# Patient Record
Sex: Male | Born: 1935 | Race: White | Hispanic: No | Marital: Married | Smoking: Former smoker
Health system: Southern US, Community
[De-identification: ages and names within clinical notes are randomized; demographics above are authoritative.]

## PROBLEM LIST (undated history)

## (undated) DIAGNOSIS — I739 Peripheral vascular disease, unspecified: Secondary | ICD-10-CM

## (undated) DIAGNOSIS — I4891 Unspecified atrial fibrillation: Secondary | ICD-10-CM

## (undated) DIAGNOSIS — I10 Essential (primary) hypertension: Secondary | ICD-10-CM

## (undated) DIAGNOSIS — I509 Heart failure, unspecified: Secondary | ICD-10-CM

## (undated) DIAGNOSIS — Z953 Presence of xenogenic heart valve: Secondary | ICD-10-CM

## (undated) DIAGNOSIS — Z951 Presence of aortocoronary bypass graft: Secondary | ICD-10-CM

## (undated) DIAGNOSIS — I2510 Atherosclerotic heart disease of native coronary artery without angina pectoris: Secondary | ICD-10-CM

## (undated) HISTORY — PX: ABDOMINAL SURGERY: SHX537

## (undated) HISTORY — PX: CATARACT EXTRACTION W/ INTRAOCULAR LENS IMPLANT: SHX1309

## (undated) HISTORY — PX: HERNIA REPAIR: SHX51

## (undated) HISTORY — PX: BACK SURGERY: SHX140

---

## 2005-07-22 ENCOUNTER — Ambulatory Visit: Payer: Self-pay | Admitting: Cardiology

## 2005-07-26 ENCOUNTER — Ambulatory Visit: Payer: Self-pay

## 2005-08-31 ENCOUNTER — Ambulatory Visit: Payer: Self-pay | Admitting: Ophthalmology

## 2005-09-06 ENCOUNTER — Ambulatory Visit: Payer: Self-pay | Admitting: Ophthalmology

## 2005-10-19 ENCOUNTER — Ambulatory Visit: Payer: Self-pay | Admitting: Ophthalmology

## 2005-10-21 ENCOUNTER — Ambulatory Visit: Payer: Self-pay

## 2005-10-25 ENCOUNTER — Ambulatory Visit: Payer: Self-pay | Admitting: Ophthalmology

## 2005-11-30 ENCOUNTER — Ambulatory Visit: Payer: Self-pay | Admitting: Cardiology

## 2005-12-02 ENCOUNTER — Ambulatory Visit: Payer: Self-pay | Admitting: Physician Assistant

## 2006-02-17 ENCOUNTER — Ambulatory Visit: Payer: Self-pay | Admitting: Internal Medicine

## 2006-12-01 ENCOUNTER — Ambulatory Visit: Payer: Self-pay | Admitting: Urology

## 2006-12-19 DIAGNOSIS — Z951 Presence of aortocoronary bypass graft: Secondary | ICD-10-CM

## 2006-12-19 DIAGNOSIS — Z953 Presence of xenogenic heart valve: Secondary | ICD-10-CM

## 2006-12-19 HISTORY — PX: CORONARY ARTERY BYPASS GRAFT: SHX141

## 2006-12-19 HISTORY — DX: Presence of xenogenic heart valve: Z95.3

## 2006-12-19 HISTORY — DX: Presence of aortocoronary bypass graft: Z95.1

## 2007-01-30 ENCOUNTER — Ambulatory Visit: Payer: Self-pay | Admitting: Surgery

## 2007-02-06 ENCOUNTER — Inpatient Hospital Stay: Payer: Self-pay | Admitting: Surgery

## 2007-02-10 ENCOUNTER — Emergency Department: Payer: Self-pay | Admitting: Unknown Physician Specialty

## 2007-04-16 ENCOUNTER — Inpatient Hospital Stay: Payer: Self-pay | Admitting: Internal Medicine

## 2007-04-16 ENCOUNTER — Other Ambulatory Visit: Payer: Self-pay

## 2007-05-16 ENCOUNTER — Encounter: Payer: Self-pay | Admitting: Cardiology

## 2007-05-20 ENCOUNTER — Encounter: Payer: Self-pay | Admitting: Cardiology

## 2007-06-19 ENCOUNTER — Encounter: Payer: Self-pay | Admitting: Cardiology

## 2007-07-20 ENCOUNTER — Encounter: Payer: Self-pay | Admitting: Cardiology

## 2007-08-16 ENCOUNTER — Ambulatory Visit: Payer: Self-pay | Admitting: Internal Medicine

## 2007-08-20 ENCOUNTER — Encounter: Payer: Self-pay | Admitting: Cardiology

## 2007-08-20 ENCOUNTER — Ambulatory Visit: Payer: Self-pay | Admitting: Internal Medicine

## 2007-09-19 ENCOUNTER — Encounter: Payer: Self-pay | Admitting: Cardiology

## 2008-04-23 ENCOUNTER — Ambulatory Visit: Payer: Self-pay | Admitting: Internal Medicine

## 2008-06-10 ENCOUNTER — Ambulatory Visit: Payer: Self-pay | Admitting: Pain Medicine

## 2008-06-24 ENCOUNTER — Ambulatory Visit: Payer: Self-pay | Admitting: Pain Medicine

## 2008-06-25 ENCOUNTER — Ambulatory Visit: Payer: Self-pay | Admitting: Pain Medicine

## 2008-06-26 ENCOUNTER — Ambulatory Visit: Payer: Self-pay | Admitting: Pain Medicine

## 2008-07-02 ENCOUNTER — Ambulatory Visit: Payer: Self-pay | Admitting: Pain Medicine

## 2008-07-07 ENCOUNTER — Ambulatory Visit: Payer: Self-pay | Admitting: Pain Medicine

## 2008-07-22 ENCOUNTER — Ambulatory Visit: Payer: Self-pay | Admitting: Pain Medicine

## 2008-08-06 ENCOUNTER — Ambulatory Visit: Payer: Self-pay | Admitting: Surgery

## 2008-08-13 ENCOUNTER — Inpatient Hospital Stay: Payer: Self-pay | Admitting: Surgery

## 2008-10-15 ENCOUNTER — Ambulatory Visit: Payer: Self-pay | Admitting: Internal Medicine

## 2008-11-06 ENCOUNTER — Ambulatory Visit: Payer: Self-pay | Admitting: Unknown Physician Specialty

## 2008-11-10 ENCOUNTER — Ambulatory Visit: Payer: Self-pay | Admitting: Unknown Physician Specialty

## 2010-01-28 ENCOUNTER — Inpatient Hospital Stay: Payer: Self-pay | Admitting: Vascular Surgery

## 2010-02-18 ENCOUNTER — Inpatient Hospital Stay: Payer: Self-pay | Admitting: Vascular Surgery

## 2010-04-14 ENCOUNTER — Ambulatory Visit: Payer: Self-pay | Admitting: Vascular Surgery

## 2010-06-30 ENCOUNTER — Ambulatory Visit: Payer: Self-pay | Admitting: Internal Medicine

## 2010-07-21 ENCOUNTER — Ambulatory Visit: Payer: Self-pay | Admitting: Internal Medicine

## 2010-07-28 ENCOUNTER — Ambulatory Visit: Payer: Self-pay | Admitting: Internal Medicine

## 2010-08-12 ENCOUNTER — Ambulatory Visit: Payer: Self-pay | Admitting: Internal Medicine

## 2010-08-19 ENCOUNTER — Ambulatory Visit: Payer: Self-pay | Admitting: Internal Medicine

## 2010-09-16 ENCOUNTER — Ambulatory Visit: Payer: Self-pay | Admitting: Internal Medicine

## 2010-09-16 ENCOUNTER — Inpatient Hospital Stay: Payer: Self-pay | Admitting: Internal Medicine

## 2010-09-30 ENCOUNTER — Ambulatory Visit: Payer: Self-pay | Admitting: Podiatry

## 2011-05-03 ENCOUNTER — Encounter: Payer: Self-pay | Admitting: Cardiothoracic Surgery

## 2011-05-18 ENCOUNTER — Ambulatory Visit: Payer: Self-pay | Admitting: Vascular Surgery

## 2011-05-20 ENCOUNTER — Encounter: Payer: Self-pay | Admitting: Cardiothoracic Surgery

## 2011-08-23 ENCOUNTER — Encounter: Payer: Self-pay | Admitting: Cardiothoracic Surgery

## 2011-08-23 ENCOUNTER — Encounter: Payer: Self-pay | Admitting: Nurse Practitioner

## 2011-08-26 ENCOUNTER — Emergency Department: Payer: Self-pay

## 2011-09-19 ENCOUNTER — Encounter: Payer: Self-pay | Admitting: Cardiothoracic Surgery

## 2011-09-19 ENCOUNTER — Encounter: Payer: Self-pay | Admitting: Nurse Practitioner

## 2011-12-19 ENCOUNTER — Emergency Department: Payer: Self-pay | Admitting: Emergency Medicine

## 2012-01-19 ENCOUNTER — Ambulatory Visit: Payer: Self-pay | Admitting: Vascular Surgery

## 2012-01-19 LAB — BASIC METABOLIC PANEL
Calcium, Total: 9 mg/dL (ref 8.5–10.1)
Chloride: 103 mmol/L (ref 98–107)
Co2: 32 mmol/L (ref 21–32)
Creatinine: 1.72 mg/dL — ABNORMAL HIGH (ref 0.60–1.30)
EGFR (African American): 49 — ABNORMAL LOW
EGFR (Non-African Amer.): 40 — ABNORMAL LOW
Potassium: 4.3 mmol/L (ref 3.5–5.1)
Sodium: 143 mmol/L (ref 136–145)

## 2012-01-19 LAB — PROTIME-INR: Prothrombin Time: 22.3 secs — ABNORMAL HIGH (ref 11.5–14.7)

## 2012-07-19 ENCOUNTER — Ambulatory Visit: Payer: Self-pay | Admitting: Internal Medicine

## 2012-07-19 HISTORY — PX: BELOW KNEE LEG AMPUTATION: SUR23

## 2012-07-24 ENCOUNTER — Inpatient Hospital Stay: Payer: Self-pay | Admitting: Physician Assistant

## 2012-07-24 LAB — BASIC METABOLIC PANEL
BUN: 42 mg/dL — ABNORMAL HIGH (ref 7–18)
Calcium, Total: 8.5 mg/dL (ref 8.5–10.1)
Creatinine: 1.69 mg/dL — ABNORMAL HIGH (ref 0.60–1.30)
EGFR (African American): 42 — ABNORMAL LOW
EGFR (Non-African Amer.): 36 — ABNORMAL LOW
Glucose: 315 mg/dL — ABNORMAL HIGH (ref 65–99)
Sodium: 137 mmol/L (ref 136–145)

## 2012-07-24 LAB — PROTIME-INR: Prothrombin Time: 48.4 secs — ABNORMAL HIGH (ref 11.5–14.7)

## 2012-07-25 LAB — CBC WITH DIFFERENTIAL/PLATELET
Basophil #: 0.1 10*3/uL (ref 0.0–0.1)
Basophil %: 1.1 %
Eosinophil %: 3.7 %
HCT: 38.2 % — ABNORMAL LOW (ref 40.0–52.0)
HGB: 12.8 g/dL — ABNORMAL LOW (ref 13.0–18.0)
Lymphocyte #: 1.1 10*3/uL (ref 1.0–3.6)
Lymphocyte %: 10.2 %
MCV: 96 fL (ref 80–100)
Monocyte %: 8.6 %
Neutrophil #: 8.2 10*3/uL — ABNORMAL HIGH (ref 1.4–6.5)
RBC: 4 10*6/uL — ABNORMAL LOW (ref 4.40–5.90)
WBC: 10.7 10*3/uL — ABNORMAL HIGH (ref 3.8–10.6)

## 2012-07-25 LAB — BASIC METABOLIC PANEL
BUN: 32 mg/dL — ABNORMAL HIGH (ref 7–18)
Calcium, Total: 8.4 mg/dL — ABNORMAL LOW (ref 8.5–10.1)
Creatinine: 1.46 mg/dL — ABNORMAL HIGH (ref 0.60–1.30)
EGFR (African American): 50 — ABNORMAL LOW
EGFR (Non-African Amer.): 43 — ABNORMAL LOW
Glucose: 179 mg/dL — ABNORMAL HIGH (ref 65–99)
Potassium: 3.9 mmol/L (ref 3.5–5.1)
Sodium: 140 mmol/L (ref 136–145)

## 2012-07-25 LAB — PROTIME-INR
INR: 5.3
Prothrombin Time: 48.4 secs — ABNORMAL HIGH (ref 11.5–14.7)

## 2012-07-26 LAB — CBC WITH DIFFERENTIAL/PLATELET
Basophil %: 1 %
Eosinophil #: 0.4 10*3/uL (ref 0.0–0.7)
Eosinophil %: 3.6 %
HCT: 41.1 % (ref 40.0–52.0)
HGB: 14.3 g/dL (ref 13.0–18.0)
Lymphocyte %: 11.5 %
MCHC: 34.7 g/dL (ref 32.0–36.0)
Monocyte %: 8.3 %
Neutrophil #: 9.1 10*3/uL — ABNORMAL HIGH (ref 1.4–6.5)
Neutrophil %: 75.6 %
RBC: 4.26 10*6/uL — ABNORMAL LOW (ref 4.40–5.90)
WBC: 12 10*3/uL — ABNORMAL HIGH (ref 3.8–10.6)

## 2012-07-26 LAB — BASIC METABOLIC PANEL
BUN: 31 mg/dL — ABNORMAL HIGH (ref 7–18)
Creatinine: 1.55 mg/dL — ABNORMAL HIGH (ref 0.60–1.30)
EGFR (Non-African Amer.): 40 — ABNORMAL LOW
Glucose: 91 mg/dL (ref 65–99)
Osmolality: 280 (ref 275–301)
Potassium: 4.4 mmol/L (ref 3.5–5.1)

## 2012-07-26 LAB — TROPONIN I: Troponin-I: 0.05 ng/mL

## 2012-07-26 LAB — PROTIME-INR
INR: 2.5
Prothrombin Time: 26.8 secs — ABNORMAL HIGH (ref 11.5–14.7)

## 2012-07-27 LAB — CBC WITH DIFFERENTIAL/PLATELET
Basophil #: 0.1 10*3/uL (ref 0.0–0.1)
HCT: 38 % — ABNORMAL LOW (ref 40.0–52.0)
Lymphocyte #: 0.6 10*3/uL — ABNORMAL LOW (ref 1.0–3.6)
MCH: 32.6 pg (ref 26.0–34.0)
MCV: 97 fL (ref 80–100)
Monocyte #: 1.2 x10 3/mm — ABNORMAL HIGH (ref 0.2–1.0)
Neutrophil #: 16.5 10*3/uL — ABNORMAL HIGH (ref 1.4–6.5)
Platelet: 275 10*3/uL (ref 150–440)
RDW: 14.2 % (ref 11.5–14.5)

## 2012-07-27 LAB — PROTIME-INR
INR: 1.5
Prothrombin Time: 18.8 secs — ABNORMAL HIGH (ref 11.5–14.7)

## 2012-07-27 LAB — CK TOTAL AND CKMB (NOT AT ARMC)
CK, Total: 178 U/L (ref 35–232)
CK, Total: 189 U/L (ref 35–232)
CK-MB: 10.9 ng/mL — ABNORMAL HIGH (ref 0.5–3.6)
CK-MB: 10.6 ng/mL — ABNORMAL HIGH (ref 0.5–3.6)

## 2012-07-27 LAB — COMPREHENSIVE METABOLIC PANEL
Anion Gap: 10 (ref 7–16)
Bilirubin,Total: 0.7 mg/dL (ref 0.2–1.0)
Co2: 24 mmol/L (ref 21–32)
Creatinine: 2.35 mg/dL — ABNORMAL HIGH (ref 0.60–1.30)
Glucose: 258 mg/dL — ABNORMAL HIGH (ref 65–99)
Osmolality: 283 (ref 275–301)
SGPT (ALT): 22 U/L (ref 12–78)

## 2012-07-27 LAB — TROPONIN I: Troponin-I: 1.95 ng/mL — ABNORMAL HIGH

## 2012-07-28 LAB — COMPREHENSIVE METABOLIC PANEL
Albumin: 2.4 g/dL — ABNORMAL LOW (ref 3.4–5.0)
Alkaline Phosphatase: 84 U/L (ref 50–136)
BUN: 47 mg/dL — ABNORMAL HIGH (ref 7–18)
Chloride: 100 mmol/L (ref 98–107)
Creatinine: 2.51 mg/dL — ABNORMAL HIGH (ref 0.60–1.30)
EGFR (African American): 26 — ABNORMAL LOW
Glucose: 266 mg/dL — ABNORMAL HIGH (ref 65–99)
Potassium: 4.7 mmol/L (ref 3.5–5.1)
SGPT (ALT): 17 U/L (ref 12–78)
Sodium: 133 mmol/L — ABNORMAL LOW (ref 136–145)
Total Protein: 6.5 g/dL (ref 6.4–8.2)

## 2012-07-28 LAB — CBC WITH DIFFERENTIAL/PLATELET
Eosinophil %: 1.9 %
HCT: 37.6 % — ABNORMAL LOW (ref 40.0–52.0)
Lymphocyte #: 0.7 10*3/uL — ABNORMAL LOW (ref 1.0–3.6)
Lymphocyte %: 5.3 %
MCV: 97 fL (ref 80–100)
Monocyte %: 6.4 %
Neutrophil #: 11.7 10*3/uL — ABNORMAL HIGH (ref 1.4–6.5)
Platelet: 239 10*3/uL (ref 150–440)
RBC: 3.89 10*6/uL — ABNORMAL LOW (ref 4.40–5.90)
WBC: 13.6 10*3/uL — ABNORMAL HIGH (ref 3.8–10.6)

## 2012-07-28 LAB — PROTIME-INR
INR: 1.2
Prothrombin Time: 15.5 secs — ABNORMAL HIGH (ref 11.5–14.7)

## 2012-07-29 LAB — CBC WITH DIFFERENTIAL/PLATELET
Basophil %: 0.4 %
HCT: 36.1 % — ABNORMAL LOW (ref 40.0–52.0)
HGB: 12.2 g/dL — ABNORMAL LOW (ref 13.0–18.0)
Lymphocyte #: 0.8 10*3/uL — ABNORMAL LOW (ref 1.0–3.6)
Lymphocyte %: 6.5 %
MCV: 96 fL (ref 80–100)
Monocyte %: 7.7 %
Neutrophil #: 10.3 10*3/uL — ABNORMAL HIGH (ref 1.4–6.5)
RDW: 14 % (ref 11.5–14.5)
WBC: 12.6 10*3/uL — ABNORMAL HIGH (ref 3.8–10.6)

## 2012-07-29 LAB — BASIC METABOLIC PANEL
Anion Gap: 7 (ref 7–16)
Calcium, Total: 8 mg/dL — ABNORMAL LOW (ref 8.5–10.1)
Co2: 26 mmol/L (ref 21–32)
EGFR (African American): 32 — ABNORMAL LOW

## 2012-07-29 LAB — PROTIME-INR: INR: 1.1

## 2012-07-30 LAB — CBC WITH DIFFERENTIAL/PLATELET
Basophil #: 0.1 10*3/uL (ref 0.0–0.1)
Eosinophil %: 0.4 %
Lymphocyte #: 0.7 10*3/uL — ABNORMAL LOW (ref 1.0–3.6)
Lymphocyte %: 3.9 %
MCV: 97 fL (ref 80–100)
Monocyte #: 1.4 x10 3/mm — ABNORMAL HIGH (ref 0.2–1.0)
Monocyte %: 7.3 %
Neutrophil #: 16.5 10*3/uL — ABNORMAL HIGH (ref 1.4–6.5)
Platelet: 233 10*3/uL (ref 150–440)
RBC: 3.27 10*6/uL — ABNORMAL LOW (ref 4.40–5.90)
RDW: 14.5 % (ref 11.5–14.5)
WBC: 18.8 10*3/uL — ABNORMAL HIGH (ref 3.8–10.6)

## 2012-07-30 LAB — BASIC METABOLIC PANEL
Anion Gap: 7 (ref 7–16)
Chloride: 103 mmol/L (ref 98–107)
Co2: 25 mmol/L (ref 21–32)
Creatinine: 1.9 mg/dL — ABNORMAL HIGH (ref 0.60–1.30)
Potassium: 5.1 mmol/L (ref 3.5–5.1)

## 2012-07-31 LAB — PHOSPHORUS: Phosphorus: 2.8 mg/dL (ref 2.5–4.9)

## 2012-07-31 LAB — CBC WITH DIFFERENTIAL/PLATELET
Basophil #: 0.1 10*3/uL (ref 0.0–0.1)
Basophil %: 0.5 %
Eosinophil #: 0.2 10*3/uL (ref 0.0–0.7)
HCT: 29 % — ABNORMAL LOW (ref 40.0–52.0)
HGB: 9.7 g/dL — ABNORMAL LOW (ref 13.0–18.0)
Lymphocyte #: 0.7 10*3/uL — ABNORMAL LOW (ref 1.0–3.6)
Lymphocyte %: 4 %
MCHC: 33.4 g/dL (ref 32.0–36.0)
MCV: 98 fL (ref 80–100)
Monocyte %: 9.4 %
Neutrophil #: 14.3 10*3/uL — ABNORMAL HIGH (ref 1.4–6.5)

## 2012-07-31 LAB — BASIC METABOLIC PANEL
Calcium, Total: 8.1 mg/dL — ABNORMAL LOW (ref 8.5–10.1)
Co2: 26 mmol/L (ref 21–32)
Creatinine: 2.14 mg/dL — ABNORMAL HIGH (ref 0.60–1.30)
EGFR (African American): 31 — ABNORMAL LOW
Potassium: 4.7 mmol/L (ref 3.5–5.1)
Sodium: 136 mmol/L (ref 136–145)

## 2012-08-01 LAB — BASIC METABOLIC PANEL
Calcium, Total: 8.3 mg/dL — ABNORMAL LOW (ref 8.5–10.1)
Chloride: 105 mmol/L (ref 98–107)
Co2: 22 mmol/L (ref 21–32)
EGFR (African American): 34 — ABNORMAL LOW
EGFR (Non-African Amer.): 29 — ABNORMAL LOW
Osmolality: 288 (ref 275–301)

## 2012-08-02 LAB — CBC WITH DIFFERENTIAL/PLATELET
Basophil #: 0.1 10*3/uL (ref 0.0–0.1)
HCT: 27.6 % — ABNORMAL LOW (ref 40.0–52.0)
Lymphocyte %: 8 %
MCV: 97 fL (ref 80–100)
Monocyte %: 10 %
Neutrophil #: 15.2 10*3/uL — ABNORMAL HIGH (ref 1.4–6.5)
RBC: 2.84 10*6/uL — ABNORMAL LOW (ref 4.40–5.90)
WBC: 18.8 10*3/uL — ABNORMAL HIGH (ref 3.8–10.6)

## 2012-08-02 LAB — BASIC METABOLIC PANEL
BUN: 44 mg/dL — ABNORMAL HIGH (ref 7–18)
Creatinine: 1.92 mg/dL — ABNORMAL HIGH (ref 0.60–1.30)
EGFR (Non-African Amer.): 31 — ABNORMAL LOW
Glucose: 127 mg/dL — ABNORMAL HIGH (ref 65–99)
Osmolality: 288 (ref 275–301)
Potassium: 4.3 mmol/L (ref 3.5–5.1)
Sodium: 138 mmol/L (ref 136–145)

## 2012-08-02 LAB — PROTIME-INR: Prothrombin Time: 14.3 secs (ref 11.5–14.7)

## 2012-08-03 LAB — CBC WITH DIFFERENTIAL/PLATELET
Basophil #: 0.1 10*3/uL (ref 0.0–0.1)
Basophil %: 0.5 %
Eosinophil #: 0.3 10*3/uL (ref 0.0–0.7)
Eosinophil %: 1.4 %
HGB: 9.5 g/dL — ABNORMAL LOW (ref 13.0–18.0)
Lymphocyte #: 1.1 10*3/uL (ref 1.0–3.6)
MCH: 32 pg (ref 26.0–34.0)
MCV: 98 fL (ref 80–100)
RBC: 2.96 10*6/uL — ABNORMAL LOW (ref 4.40–5.90)
WBC: 19.7 10*3/uL — ABNORMAL HIGH (ref 3.8–10.6)

## 2012-08-03 LAB — BASIC METABOLIC PANEL
Anion Gap: 9 (ref 7–16)
BUN: 40 mg/dL — ABNORMAL HIGH (ref 7–18)
Creatinine: 1.74 mg/dL — ABNORMAL HIGH (ref 0.60–1.30)
EGFR (African American): 40 — ABNORMAL LOW
Sodium: 138 mmol/L (ref 136–145)

## 2012-08-03 LAB — PROTIME-INR: INR: 1.6

## 2012-08-09 ENCOUNTER — Inpatient Hospital Stay: Payer: Self-pay | Admitting: Internal Medicine

## 2012-08-09 LAB — URINALYSIS, COMPLETE
Bilirubin,UR: NEGATIVE
Blood: NEGATIVE
Glucose,UR: 50 mg/dL (ref 0–75)
Hyaline Cast: 39
Nitrite: NEGATIVE
RBC,UR: 4 /HPF (ref 0–5)
Specific Gravity: 1.018 (ref 1.003–1.030)
Squamous Epithelial: <1
WBC UR: 5 /HPF (ref 0–5)

## 2012-08-09 LAB — BASIC METABOLIC PANEL
BUN: 32 mg/dL — ABNORMAL HIGH (ref 7–18)
Calcium, Total: 8.2 mg/dL — ABNORMAL LOW (ref 8.5–10.1)
Chloride: 102 mmol/L (ref 98–107)
Co2: 27 mmol/L (ref 21–32)
EGFR (African American): 29 — ABNORMAL LOW
Glucose: 277 mg/dL — ABNORMAL HIGH (ref 65–99)
Sodium: 135 mmol/L — ABNORMAL LOW (ref 136–145)

## 2012-08-09 LAB — CK TOTAL AND CKMB (NOT AT ARMC)
CK, Total: 105 U/L (ref 35–232)
CK, Total: 90 U/L (ref 35–232)
CK-MB: 3.2 ng/mL (ref 0.5–3.6)
CK-MB: 3.4 ng/mL (ref 0.5–3.6)

## 2012-08-09 LAB — CBC
HCT: 30 % — ABNORMAL LOW (ref 40.0–52.0)
MCH: 31.3 pg (ref 26.0–34.0)
MCHC: 31.6 g/dL — ABNORMAL LOW (ref 32.0–36.0)
WBC: 17.1 10*3/uL — ABNORMAL HIGH (ref 3.8–10.6)

## 2012-08-09 LAB — TROPONIN I
Troponin-I: 0.61 ng/mL — ABNORMAL HIGH
Troponin-I: 0.72 ng/mL — ABNORMAL HIGH

## 2012-08-09 LAB — PROTIME-INR: Prothrombin Time: 30 secs — ABNORMAL HIGH (ref 11.5–14.7)

## 2012-08-10 LAB — CBC WITH DIFFERENTIAL/PLATELET
Eosinophil %: 0.6 %
HCT: 29 % — ABNORMAL LOW (ref 40.0–52.0)
Lymphocyte #: 1 10*3/uL (ref 1.0–3.6)
MCH: 33.7 pg (ref 26.0–34.0)
MCHC: 33.8 g/dL (ref 32.0–36.0)
MCV: 100 fL (ref 80–100)
Monocyte #: 1.2 x10 3/mm — ABNORMAL HIGH (ref 0.2–1.0)
Monocyte %: 7.6 %
Neutrophil #: 13.7 10*3/uL — ABNORMAL HIGH (ref 1.4–6.5)
Platelet: 406 10*3/uL (ref 150–440)
RBC: 2.91 10*6/uL — ABNORMAL LOW (ref 4.40–5.90)
WBC: 16.1 10*3/uL — ABNORMAL HIGH (ref 3.8–10.6)

## 2012-08-10 LAB — BASIC METABOLIC PANEL
Anion Gap: 5 — ABNORMAL LOW (ref 7–16)
BUN: 36 mg/dL — ABNORMAL HIGH (ref 7–18)
Chloride: 101 mmol/L (ref 98–107)
Co2: 28 mmol/L (ref 21–32)
Creatinine: 2.67 mg/dL — ABNORMAL HIGH (ref 0.60–1.30)
EGFR (African American): 24 — ABNORMAL LOW
EGFR (Non-African Amer.): 21 — ABNORMAL LOW
Glucose: 104 mg/dL — ABNORMAL HIGH (ref 65–99)
Osmolality: 277 (ref 275–301)

## 2012-08-10 LAB — PROTIME-INR
INR: 2.9
Prothrombin Time: 30.1 secs — ABNORMAL HIGH (ref 11.5–14.7)

## 2012-08-10 LAB — CK TOTAL AND CKMB (NOT AT ARMC): CK, Total: 130 U/L (ref 35–232)

## 2012-08-11 LAB — BASIC METABOLIC PANEL
Anion Gap: 12 (ref 7–16)
BUN: 42 mg/dL — ABNORMAL HIGH (ref 7–18)
Chloride: 99 mmol/L (ref 98–107)
Creatinine: 2.59 mg/dL — ABNORMAL HIGH (ref 0.60–1.30)
EGFR (Non-African Amer.): 21 — ABNORMAL LOW
Glucose: 118 mg/dL — ABNORMAL HIGH (ref 65–99)
Osmolality: 284 (ref 275–301)
Potassium: 4.9 mmol/L (ref 3.5–5.1)
Sodium: 136 mmol/L (ref 136–145)

## 2012-08-11 LAB — CBC WITH DIFFERENTIAL/PLATELET
Basophil %: 0.9 %
Eosinophil %: 0.6 %
HCT: 28.6 % — ABNORMAL LOW (ref 40.0–52.0)
Lymphocyte %: 3 %
Monocyte %: 5.6 %
Neutrophil #: 13.1 10*3/uL — ABNORMAL HIGH (ref 1.4–6.5)

## 2012-08-11 LAB — PROTIME-INR: INR: 3.3

## 2012-08-12 LAB — BASIC METABOLIC PANEL
Anion Gap: 12 (ref 7–16)
Calcium, Total: 8.3 mg/dL — ABNORMAL LOW (ref 8.5–10.1)
Chloride: 104 mmol/L (ref 98–107)
Co2: 23 mmol/L (ref 21–32)
Creatinine: 2.53 mg/dL — ABNORMAL HIGH (ref 0.60–1.30)
EGFR (African American): 26 — ABNORMAL LOW
EGFR (Non-African Amer.): 22 — ABNORMAL LOW
Osmolality: 289 (ref 275–301)
Sodium: 139 mmol/L (ref 136–145)

## 2012-08-12 LAB — CBC WITH DIFFERENTIAL/PLATELET
Basophil #: 0.1 10*3/uL (ref 0.0–0.1)
HCT: 30.2 % — ABNORMAL LOW (ref 40.0–52.0)
HGB: 9.9 g/dL — ABNORMAL LOW (ref 13.0–18.0)
Lymphocyte %: 3.5 %
MCH: 32.2 pg (ref 26.0–34.0)
MCHC: 32.7 g/dL (ref 32.0–36.0)
MCV: 99 fL (ref 80–100)
Monocyte #: 1.8 x10 3/mm — ABNORMAL HIGH (ref 0.2–1.0)
Platelet: 402 10*3/uL (ref 150–440)

## 2012-08-13 LAB — COMPREHENSIVE METABOLIC PANEL
Alkaline Phosphatase: 89 U/L (ref 50–136)
BUN: 53 mg/dL — ABNORMAL HIGH (ref 7–18)
Calcium, Total: 8 mg/dL — ABNORMAL LOW (ref 8.5–10.1)
Co2: 31 mmol/L (ref 21–32)
Creatinine: 2.37 mg/dL — ABNORMAL HIGH (ref 0.60–1.30)
EGFR (Non-African Amer.): 24 — ABNORMAL LOW
Glucose: 329 mg/dL — ABNORMAL HIGH (ref 65–99)
Osmolality: 295 (ref 275–301)
SGPT (ALT): 22 U/L (ref 12–78)
Sodium: 134 mmol/L — ABNORMAL LOW (ref 136–145)
Total Protein: 6.5 g/dL (ref 6.4–8.2)

## 2012-08-13 LAB — CBC WITH DIFFERENTIAL/PLATELET
Basophil %: 1.3 %
Eosinophil #: 0.3 10*3/uL (ref 0.0–0.7)
Eosinophil %: 2.5 %
HCT: 29.5 % — ABNORMAL LOW (ref 40.0–52.0)
HGB: 9.4 g/dL — ABNORMAL LOW (ref 13.0–18.0)
Lymphocyte #: 1.1 10*3/uL (ref 1.0–3.6)
MCH: 31.9 pg (ref 26.0–34.0)
MCHC: 31.8 g/dL — ABNORMAL LOW (ref 32.0–36.0)
MCV: 100 fL (ref 80–100)
Monocyte #: 1 x10 3/mm (ref 0.2–1.0)
Platelet: 387 10*3/uL (ref 150–440)
RBC: 2.94 10*6/uL — ABNORMAL LOW (ref 4.40–5.90)
WBC: 10.6 10*3/uL (ref 3.8–10.6)

## 2012-08-13 LAB — PROTIME-INR
INR: 2.4
Prothrombin Time: 26.2 secs — ABNORMAL HIGH (ref 11.5–14.7)

## 2012-08-14 LAB — CBC WITH DIFFERENTIAL/PLATELET
Basophil %: 2.2 %
Eosinophil #: 0.4 10*3/uL (ref 0.0–0.7)
HCT: 33.7 % — ABNORMAL LOW (ref 40.0–52.0)
HGB: 10.7 g/dL — ABNORMAL LOW (ref 13.0–18.0)
MCH: 31.7 pg (ref 26.0–34.0)
MCHC: 31.7 g/dL — ABNORMAL LOW (ref 32.0–36.0)
MCV: 100 fL (ref 80–100)
Monocyte #: 0.7 x10 3/mm (ref 0.2–1.0)
Monocyte %: 6.7 %
Neutrophil #: 8.5 10*3/uL — ABNORMAL HIGH (ref 1.4–6.5)

## 2012-08-14 LAB — BASIC METABOLIC PANEL
Anion Gap: 7 (ref 7–16)
BUN: 46 mg/dL — ABNORMAL HIGH (ref 7–18)
Creatinine: 1.82 mg/dL — ABNORMAL HIGH (ref 0.60–1.30)
EGFR (African American): 38 — ABNORMAL LOW
Glucose: 239 mg/dL — ABNORMAL HIGH (ref 65–99)
Sodium: 133 mmol/L — ABNORMAL LOW (ref 136–145)

## 2012-08-14 LAB — CULTURE, BLOOD (SINGLE)

## 2012-08-19 ENCOUNTER — Ambulatory Visit: Payer: Self-pay | Admitting: Internal Medicine

## 2012-09-25 ENCOUNTER — Emergency Department (HOSPITAL_COMMUNITY): Payer: Medicare Other

## 2012-09-25 ENCOUNTER — Encounter (HOSPITAL_COMMUNITY): Payer: Self-pay | Admitting: Emergency Medicine

## 2012-09-25 ENCOUNTER — Inpatient Hospital Stay (HOSPITAL_COMMUNITY): Payer: Medicare Other

## 2012-09-25 ENCOUNTER — Inpatient Hospital Stay (HOSPITAL_COMMUNITY)
Admission: EM | Admit: 2012-09-25 | Discharge: 2012-10-03 | DRG: 064 | Disposition: A | Discharge status: Rehabilitation Facility | Payer: Medicare Other | Attending: Internal Medicine | Admitting: Internal Medicine

## 2012-09-25 DIAGNOSIS — Z7901 Long term (current) use of anticoagulants: Secondary | ICD-10-CM

## 2012-09-25 DIAGNOSIS — I214 Non-ST elevation (NSTEMI) myocardial infarction: Secondary | ICD-10-CM

## 2012-09-25 DIAGNOSIS — R4701 Aphasia: Secondary | ICD-10-CM | POA: Diagnosis present

## 2012-09-25 DIAGNOSIS — R5381 Other malaise: Secondary | ICD-10-CM | POA: Diagnosis present

## 2012-09-25 DIAGNOSIS — I6529 Occlusion and stenosis of unspecified carotid artery: Secondary | ICD-10-CM | POA: Diagnosis present

## 2012-09-25 DIAGNOSIS — M4850XA Collapsed vertebra, not elsewhere classified, site unspecified, initial encounter for fracture: Secondary | ICD-10-CM | POA: Diagnosis present

## 2012-09-25 DIAGNOSIS — Z951 Presence of aortocoronary bypass graft: Secondary | ICD-10-CM

## 2012-09-25 DIAGNOSIS — I639 Cerebral infarction, unspecified: Secondary | ICD-10-CM

## 2012-09-25 DIAGNOSIS — T50995A Adverse effect of other drugs, medicaments and biological substances, initial encounter: Secondary | ICD-10-CM | POA: Diagnosis not present

## 2012-09-25 DIAGNOSIS — I2510 Atherosclerotic heart disease of native coronary artery without angina pectoris: Secondary | ICD-10-CM | POA: Diagnosis present

## 2012-09-25 DIAGNOSIS — I10 Essential (primary) hypertension: Secondary | ICD-10-CM

## 2012-09-25 DIAGNOSIS — N179 Acute kidney failure, unspecified: Secondary | ICD-10-CM | POA: Diagnosis not present

## 2012-09-25 DIAGNOSIS — IMO0001 Reserved for inherently not codable concepts without codable children: Secondary | ICD-10-CM | POA: Diagnosis present

## 2012-09-25 DIAGNOSIS — Z87891 Personal history of nicotine dependence: Secondary | ICD-10-CM

## 2012-09-25 DIAGNOSIS — R1310 Dysphagia, unspecified: Secondary | ICD-10-CM | POA: Diagnosis present

## 2012-09-25 DIAGNOSIS — R471 Dysarthria and anarthria: Secondary | ICD-10-CM | POA: Diagnosis present

## 2012-09-25 DIAGNOSIS — J690 Pneumonitis due to inhalation of food and vomit: Secondary | ICD-10-CM | POA: Diagnosis present

## 2012-09-25 DIAGNOSIS — I5031 Acute diastolic (congestive) heart failure: Secondary | ICD-10-CM | POA: Diagnosis not present

## 2012-09-25 DIAGNOSIS — I509 Heart failure, unspecified: Secondary | ICD-10-CM

## 2012-09-25 DIAGNOSIS — E119 Type 2 diabetes mellitus without complications: Secondary | ICD-10-CM

## 2012-09-25 DIAGNOSIS — S88119A Complete traumatic amputation at level between knee and ankle, unspecified lower leg, initial encounter: Secondary | ICD-10-CM

## 2012-09-25 DIAGNOSIS — D72829 Elevated white blood cell count, unspecified: Secondary | ICD-10-CM | POA: Diagnosis present

## 2012-09-25 DIAGNOSIS — I4891 Unspecified atrial fibrillation: Secondary | ICD-10-CM

## 2012-09-25 DIAGNOSIS — Z23 Encounter for immunization: Secondary | ICD-10-CM

## 2012-09-25 DIAGNOSIS — M8448XA Pathological fracture, other site, initial encounter for fracture: Secondary | ICD-10-CM | POA: Diagnosis present

## 2012-09-25 DIAGNOSIS — Z794 Long term (current) use of insulin: Secondary | ICD-10-CM

## 2012-09-25 DIAGNOSIS — M549 Dorsalgia, unspecified: Secondary | ICD-10-CM | POA: Diagnosis present

## 2012-09-25 DIAGNOSIS — I6350 Cerebral infarction due to unspecified occlusion or stenosis of unspecified cerebral artery: Principal | ICD-10-CM | POA: Diagnosis present

## 2012-09-25 DIAGNOSIS — G8190 Hemiplegia, unspecified affecting unspecified side: Secondary | ICD-10-CM | POA: Diagnosis present

## 2012-09-25 DIAGNOSIS — Z952 Presence of prosthetic heart valve: Secondary | ICD-10-CM

## 2012-09-25 DIAGNOSIS — Z954 Presence of other heart-valve replacement: Secondary | ICD-10-CM

## 2012-09-25 HISTORY — DX: Presence of aortocoronary bypass graft: Z95.1

## 2012-09-25 HISTORY — DX: Presence of xenogenic heart valve: Z95.3

## 2012-09-25 HISTORY — DX: Heart failure, unspecified: I50.9

## 2012-09-25 HISTORY — DX: Atherosclerotic heart disease of native coronary artery without angina pectoris: I25.10

## 2012-09-25 HISTORY — DX: Peripheral vascular disease, unspecified: I73.9

## 2012-09-25 HISTORY — DX: Essential (primary) hypertension: I10

## 2012-09-25 HISTORY — DX: Unspecified atrial fibrillation: I48.91

## 2012-09-25 LAB — CBC
MCH: 28.7 pg (ref 26.0–34.0)
MCHC: 32.1 g/dL (ref 30.0–36.0)
Platelets: 360 10*3/uL (ref 150–400)
RBC: 4.32 MIL/uL (ref 4.22–5.81)

## 2012-09-25 LAB — COMPREHENSIVE METABOLIC PANEL
ALT: 10 U/L (ref 0–53)
AST: 21 U/L (ref 0–37)
Albumin: 2.9 g/dL — ABNORMAL LOW (ref 3.5–5.2)
Alkaline Phosphatase: 96 U/L (ref 39–117)
Chloride: 97 mEq/L (ref 96–112)
Potassium: 3.9 mEq/L (ref 3.5–5.1)
Sodium: 133 mEq/L — ABNORMAL LOW (ref 135–145)
Total Protein: 7.2 g/dL (ref 6.0–8.3)

## 2012-09-25 LAB — DIFFERENTIAL
Basophils Relative: 1 % (ref 0–1)
Eosinophils Absolute: 0.4 10*3/uL (ref 0.0–0.7)
Neutro Abs: 8.4 10*3/uL — ABNORMAL HIGH (ref 1.7–7.7)
Neutrophils Relative %: 71 % (ref 43–77)

## 2012-09-25 LAB — POCT I-STAT TROPONIN I: Troponin i, poc: 0.02 ng/mL (ref 0.00–0.08)

## 2012-09-25 LAB — HEMOGLOBIN A1C
Hgb A1c MFr Bld: 8 % — ABNORMAL HIGH (ref <5.7)
Mean Plasma Glucose: 183 mg/dL — ABNORMAL HIGH (ref <117)

## 2012-09-25 LAB — APTT: aPTT: 35 seconds (ref 24–37)

## 2012-09-25 LAB — POCT I-STAT, CHEM 8
BUN: 20 mg/dL (ref 6–23)
Calcium, Ion: 1.12 mmol/L — ABNORMAL LOW (ref 1.13–1.30)
Calcium, Ion: 1.12 mmol/L — ABNORMAL LOW (ref 1.13–1.30)
Glucose, Bld: 177 mg/dL — ABNORMAL HIGH (ref 70–99)
HCT: 41 % (ref 39.0–52.0)
Hemoglobin: 13.6 g/dL (ref 13.0–17.0)
Sodium: 138 mEq/L (ref 135–145)
TCO2: 26 mmol/L (ref 0–100)
TCO2: 26 mmol/L (ref 0–100)

## 2012-09-25 LAB — GLUCOSE, CAPILLARY
Glucose-Capillary: 181 mg/dL — ABNORMAL HIGH (ref 70–99)
Glucose-Capillary: 172 mg/dL — ABNORMAL HIGH (ref 70–99)
Glucose-Capillary: 115 mg/dL — ABNORMAL HIGH (ref 70–99)

## 2012-09-25 LAB — PROTIME-INR
INR: 1.35 (ref 0.00–1.49)
Prothrombin Time: 16.4 seconds — ABNORMAL HIGH (ref 11.6–15.2)

## 2012-09-25 MED ORDER — ATORVASTATIN CALCIUM 80 MG PO TABS
80.0000 mg | ORAL_TABLET | Freq: Every day | ORAL | Status: DC
  Administered 2012-09-26 – 2012-10-02 (×6): 80 mg via ORAL
  Filled 2012-09-25 (×10): qty 1

## 2012-09-25 MED ORDER — INFLUENZA VIRUS VACC SPLIT PF IM SUSP
0.5000 mL | INTRAMUSCULAR | Status: AC
  Administered 2012-09-26: 0.5 mL via INTRAMUSCULAR
  Filled 2012-09-25: qty 0.5

## 2012-09-25 MED ORDER — MAGNESIUM SULFATE 40 MG/ML IJ SOLN
2.0000 g | Freq: Once | INTRAMUSCULAR | Status: AC
  Administered 2012-09-25: 2 g via INTRAVENOUS
  Filled 2012-09-25: qty 50

## 2012-09-25 MED ORDER — INSULIN ASPART 100 UNIT/ML ~~LOC~~ SOLN
0.0000 [IU] | Freq: Three times a day (TID) | SUBCUTANEOUS | Status: DC
  Administered 2012-09-25: 2 [IU] via SUBCUTANEOUS
  Administered 2012-09-26: 7 [IU] via SUBCUTANEOUS
  Administered 2012-09-26: 5 [IU] via SUBCUTANEOUS
  Administered 2012-09-27: 10 [IU] via SUBCUTANEOUS

## 2012-09-25 MED ORDER — ASPIRIN 325 MG PO TABS
325.0000 mg | ORAL_TABLET | Freq: Every day | ORAL | Status: DC
  Filled 2012-09-25: qty 1

## 2012-09-25 MED ORDER — POTASSIUM CHLORIDE 10 MEQ/100ML IV SOLN
10.0000 meq | INTRAVENOUS | Status: AC
  Administered 2012-09-25: 10 meq via INTRAVENOUS
  Filled 2012-09-25 (×2): qty 100

## 2012-09-25 MED ORDER — ASPIRIN 300 MG RE SUPP
300.0000 mg | Freq: Every day | RECTAL | Status: DC
  Administered 2012-09-25: 300 mg via RECTAL
  Filled 2012-09-25 (×2): qty 1

## 2012-09-25 MED ORDER — IOHEXOL 350 MG/ML SOLN
80.0000 mL | Freq: Once | INTRAVENOUS | Status: AC | PRN
  Administered 2012-09-25: 50 mL via INTRAVENOUS

## 2012-09-25 MED ORDER — SODIUM CHLORIDE 0.9 % IV SOLN
INTRAVENOUS | Status: DC
  Administered 2012-09-25 – 2012-09-28 (×4): via INTRAVENOUS
  Administered 2012-09-29: 1000 mL via INTRAVENOUS

## 2012-09-25 MED ORDER — DULOXETINE HCL 30 MG PO CPEP
30.0000 mg | ORAL_CAPSULE | Freq: Every day | ORAL | Status: DC
  Administered 2012-09-26: 30 mg via ORAL
  Filled 2012-09-25 (×3): qty 1

## 2012-09-25 NOTE — Consult Note (Signed)
Reason for Consult: Concern for stroke Referring Physician: Carleene Cooper  CC: Right-sided weakness and aphasia  History is obtained from: Caregiver, EMS  LSN: 6am Tpa: No, outside of window  HPI: Aaron [name] is an 78 y.o. male awoke this morning with right-sided weakness and aphasia. He had been seen by his caregiver at 6 AM when she checked his glucose. At that time, he was able to talk and move his right side well. At 8 AM when she rechecked on him, he was not moving his right side or talking.  She called 911.  On arrival here, he had some improvement with strength in his right arm. Over the course of my examination, he began speaking.  Of note, he was recently found to be subtherapeutic on his Coumadin, and therefore it was held for 4 days. He was restarted on Sunday.  ROS: Unable to obtain due to aphasia  Per chart Past Medical History  Diagnosis Date  . Hypertension   . Diabetes mellitus   . CHF (congestive heart failure)   . Coronary artery disease   . Atrial fibrillation   . PVD (peripheral vascular disease)     Family History: Unable to obtain due to aphasia  Social History: Tob: Unable to obtain due to aphasia  Exam: Current vital signs: BP 161/74  Pulse 84  Temp 98.6 F (37 C) (Oral)  Resp 20  SpO2 100% Vital signs in last 24 hours: Temp:  [98.6 F (37 C)] 98.6 F (37 C) (10/08 0930) Pulse Rate:  [78-85] 84  (10/08 1300) Resp:  [12-20] 20  (10/08 1300) BP: (154-170)/(70-98) 161/74 mmHg (10/08 1300) SpO2:  [87 %-100 %] 100 % (10/08 1300)  General: In bed, appears anxious CV: Normal rate, no edema Extremity: Left BKA Mental Status: Patient is awake, alert, initially follows commands but unable speak. Subsequently improved to the point that he does have some verbal output. At that time he is able to tell me the month. Cranial Nerves: II: Visual Fields are full. Pupils are equal, round, and reactive to light.  Discs are difficult to  visualize. III,IV, VI: EOMI without ptosis or diploplia.  V,VII: Facial sensation symmetric, right facial droop VIII: hearing is intact to voice X: Uvula elevates symmetrically XI: Shoulder shrug is symmetric. XII: tongue is midline without atrophy or fasciculations.  Motor: Tone is normal. Bulk is normal. On my initial exam he had continued right hand weakness, and he drifted slightly in his right arm, but this improved over the course of my exam. He never had any drift of his right leg. He had full strength in his left side Sensory: Sensation is symmetric to light touch in the arms and legs to the best of my ability to determine given his aphasia. Deep Tendon Reflexes: 2+ and symmetric in the biceps and patellae.  Plantars: Toes are downgoing on right Cerebellar: FNF intact bilaterally, unable to perform heel-knee-shin due to agitation Gait: Unable to assess due to amputation and patient safety concerns  I have reviewed labs in epic and the results pertinent to this consultation are: WBC 11.7, INR 1.35  I have reviewed the images obtained: CT head--negative acute CTA had no continued large vessel occlusion, but he does have intracranial stenosis  Impression: 78 year old male with right MCA distribution infarct. Though I suspect initially had a proximal occlusion, likely he has resolved most of his deficits, and likely the clot moved distally. He is subtherapeutic on his Coumadin, which is likely contributor to his stroke  though his high-grade stenosis could also be have been sources of emboli.  Recommendations: 1) ASA therapy 300mg  PR if fails swallow screen 325mg  PO if passes, would hold coumadin in the acute phase of his stroke but this will need to be restarted later.  2) HgbA1c, fasting lipid panel, these have been ordered 3) PT consult, OT consult, Speech consult 4) Echocardiogram 5) No need for Carotid dopplers given CTA neck already done.  6. Prophylactic therapy-ASA 325 or  300 7. Risk factor modification 8. Telemetry monitoring 9. Frequent neuro checks  Ritta Slot, MD [facility] [phone]  If 7pm- 7am, please page neurology on call at [phone].

## 2012-09-25 NOTE — ED Notes (Addendum)
Pt brought in EMS [place] from home. Stroke s/s discovered at 0800 by home health care provider when pt got out of bed. LSN 0500 when he had breakfast and then returned to bed until 0800. Pt found at 0800 with right side paralysis and unable to speak. EMS started 20g Lt AC. Pt unable to verbalize, can shake head yes and no. 184/86 last BP by EMS

## 2012-09-25 NOTE — ED Notes (Signed)
Pt to CT with Joanie Coddington, Theola Sequin

## 2012-09-25 NOTE — H&P (Signed)
Date: 09/25/2012               Patient Name:  Aaron [name] MRN: [id number]  DOB: 1924/07/29 Age / Sex: 78 y.o., male   PCP: Dr. Bethann Punches              Medical Service: Internal Medicine Teaching Service              Attending Physician: Dr. Tacey Heap    First Contact: Dr. Elenor Legato Pager: [phone]  Second Contact: Dr. Almyra Deforest Pager: [phone]            After Hours (After 5p/  First Contact Pager: [phone]  weekends / holidays): Second Contact Pager: [phone]     Chief Complaint: Right-sided paresis, dysarthria.   History of Present Illness: Patient is a 78 y.o. male with a PMHx of HTN, DM, CHF, CAD, a fib, PVD, and L BKA, who presents to [facility] for evaluation of R-sided paresis, dysarthria. Patient was last noted to be normal at 0600 this AM by his home nurse. At 0800 he was found to have dysarthria and EMS was called for transport to the [facility] ED. Patient is on warfarin therapy for his atrial fibrillation, but after an INR of 3.1 approximately one week prior to admission, patient's PCP instructed the patient to not take his coumadin from 10/2-10/5, and was instructed to resume warfarin on 10/7, which he did.   [name] is present at time of admission, and provides much of history secondary to patient's dysarthria and aphasia. He had apparently been fully healthy prior to the events this AM, aside from a fall 4-5 days prior to admission, at which time patient believes he injured his back. Patient has 24-hr home health to assist him since he had a L BKA on 07/29/2012 of this year, but before this he was living independently and only received assistance with meal preparation.    At time of admission, patient and daughter both agree that his symptoms of right-sided paresis and aphasia are substantially improved. R-sided paresis now limited to only the RUE per patient and daughter. Patient had profound aphasia this AM with no ability to communicate with family, but at time  of admission he is able to communicate verbally to a limited extent, and answers basic questions appropriately.      Review of Systems: Per HPI.   Current Outpatient Medications: Prescriptions prior to admission  Medication Sig Dispense Refill  . amLODipine (NORVASC) 5 MG tablet Take 5 mg by mouth 2 (two) times daily.      . Calcium Carbonate-Vitamin D (CALCIUM 600+D) 600-400 MG-UNIT per tablet Take 1 tablet by mouth daily.      . Cholecalciferol (VITAMIN D-3) 1000 UNITS CAPS Take 1,000 Units by mouth at bedtime.      . DULoxetine (CYMBALTA) 30 MG capsule Take 30 mg by mouth at bedtime.       Marland Kitchen HYDROCODONE-ACETAMINOPHEN PO Take 1-2 tablets by mouth every 6 (six) hours as needed. For pain      . insulin aspart (NOVOLOG) 100 UNIT/ML injection Inject 3-6 Units into the skin 3 (three) times daily before meals. 6 units every morning, 3 units with lunch and 5 units with dinner      . losartan (COZAAR) 100 MG tablet Take 50 mg by mouth 2 (two) times daily.      Marland Kitchen losartan (COZAAR) 50 MG tablet Take 50 mg by mouth 2 (two) times daily.      Marland Kitchen  metoprolol (LOPRESSOR) 50 MG tablet Take 75 mg by mouth 2 (two) times daily.      Marland Kitchen nystatin (MYCOSTATIN/NYSTOP) 100000 UNIT/GM POWD Apply topically 2 (two) times daily as needed. Apply to buttocks for rash as needed      . simvastatin (ZOCOR) 40 MG tablet Take 40 mg by mouth every evening.      . torsemide (DEMADEX) 10 MG tablet Take 10 mg by mouth 2 (two) times daily.       Marland Kitchen warfarin (COUMADIN) 2.5 MG tablet Take 2.5 mg by mouth daily.         Allergies: Allergies  Allergen Reactions  . Metformin And Related Hives  . Penicillins Hives and Swelling     Past Medical History: Past Medical History  Diagnosis Date  . Hypertension   . Diabetes mellitus   . CHF (congestive heart failure)   . Coronary artery disease   . Atrial fibrillation   . PVD (peripheral vascular disease)   . S/P CABG x 1 2008  . S/P aortic valve replacement with porcine valve  2008    Past Surgical History: Past Surgical History  Procedure Date  . Below knee leg amputation 07/2012  . Cataract extraction w/ intraocular lens implant   . Abdominal surgery   . Hernia repair     x5  . Back surgery     vertebroplasty  . Coronary artery bypass graft 2008    Family History: History reviewed. No pertinent family history.  Social History: History   Social History  . Marital Status: Married    Spouse Name: N/A    Number of Children: N/A  . Years of Education: N/A   Occupational History  . Retired Art gallery manager    Social History Main Topics  . Smoking status: Former Games developer  . Smokeless tobacco: Current User  . Alcohol Use: No  . Drug Use: No  . Sexually Active: Not on file   Other Topics Concern  . Not on file   Social History Narrative  . No narrative on file     Vital Signs: Blood pressure 161/77, pulse 90, temperature 98.3 F (36.8 C), temperature source Oral, resp. rate 18, SpO2 96.00%.  Physical Exam: General: Vital signs reviewed and noted. Lying in bed. Appears disheveled.   Head: Normocephalic, atraumatic.  Eyes: PERRL, EOMI, No signs of anemia or jaundice.  Nose: Mucous membranes moist, not inflammed, nonerythematous.  Throat: Oropharynx nonerythematous, no exudate appreciated.   Neck: No deformities, masses, or tenderness noted.Supple, No carotid Bruits, no JVD.  Lungs:  Normal respiratory effort. Clear to auscultation BL without crackles or wheezes.  Heart: Irregularly irregular. S1 and S2 normal.  Abdomen:  BS normoactive. Soft, Non-distended, non-tender.  No masses or organomegaly.  Extremities: No pretibial edema.  Neurologic: Orientation unable to be assessed 2/2 dysarthria/aphasia. Asymmetric smile with droop on L, CN II-XII otherwise intact. 5/5 strength in LUE, LLE, RLE.   Skin:  MSK:  LLE BKA with wrap clean, dry, intact. Skin hyperpigmentation on RLE, with small areas of ulceration.  TTP of L paraspinal musculature at  level of ~T10-L2.    Lab results: Basic Metabolic Panel:  Basename 09/25/12 1122 09/25/12 1001 09/25/12 0945  NA 137 138 --  K 3.8 3.8 --  CL 100 100 --  CO2 -- -- 26  GLUCOSE 177* 185* --  BUN 20 20 --  CREATININE 1.30 1.20 --  CALCIUM -- -- 9.1  MG -- -- --  PHOS -- -- --  Liver Function Tests:  Basename 09/25/12 0945  AST 21  ALT 10  ALKPHOS 96  BILITOT 0.4  PROT 7.2  ALBUMIN 2.9*   CBC:  Basename 09/25/12 1122 09/25/12 1001 09/25/12 0945  WBC -- -- 11.7*  NEUTROABS -- -- 8.4*  HGB 13.9 13.6 --  HCT 41.0 40.0 --  MCV -- -- 89.4  PLT -- -- 360   Cardiac Enzymes:  Basename 09/25/12 0945  CKTOTAL --  CKMB --  CKMBINDEX --  TROPONINI <0.30   CBG:  Basename 09/25/12 1806 09/25/12 1411 09/25/12 0954  GLUCAP 167* 172* 181*   Coagulation:  Basename 09/25/12 0930  LABPROT 16.4*  INR 1.35   Imaging results:  Ct Angio Head W/cm &/or Wo Cm  09/25/2012  *RADIOLOGY REPORT*  Clinical Data:  Stroke.  Right-sided hemiparesis and unable to speak.  Last seen normal at 5 o'clock a.m.  CT ANGIOGRAPHY HEAD AND NECK  Technique:  Multidetector CT imaging of the head and neck was performed using the standard protocol during bolus administration of intravenous contrast.  Multiplanar CT image reconstructions including MIPs were obtained to evaluate the vascular anatomy. Carotid stenosis measurements (when applicable) are obtained utilizing NASCET criteria, using the distal internal carotid diameter as the denominator.  Contrast: 50mL OMNIPAQUE IOHEXOL 350 MG/ML SOLN  Comparison:  CT head without contrast 09/25/2012.  CTA NECK  Findings:  A standard three-vessel arch configuration is present. The vertebral arteries both originate from the subclavian arteries. There is dense calcification at the origin of the nondominant right vertebral artery, suggesting high-grade stenosis.  There are tandem stenoses and C6-7 on the right and again at C5-6 on the right. There is high-grade stenosis  at the dural margin of the right vertebral artery without significant contribution to the vertebral basilar junction.  Calcification at the origin of the dominant left vertebral artery is associated with mild stenosis of less than 50%.  There is some tortuosity in the remaining left vertebral artery without significant stenosis through the vertebral basilar junction.  The right to common carotid arteries within normal limits.  Dense atherosclerotic calcifications are as proximal right internal carotid artery without significant stenosis relative to the distal vessel.  The cervical ICA is tortuous without significant stenosis.  The left common carotid artery is within normal limits.  Dense calcifications are present at the origin of the proximal left internal carotid artery.  The minimal luminal diameters 2.4 mm. Compared with a distal measurement of 4.9 mm, there is no significant stenosis.  Moderate tortuosity is present in the more distal left internal carotid artery without significant stenosis to the skull base.  The soft tissues of the neck demonstrate no focal mucosal or submucosal lesions.  No significant adenopathy is present.  Bone windows demonstrate moderate endplate degenerative changes at C3-4, C5-6, and C6-7.   Review of the MIP images confirms the above findings.  IMPRESSION:  1.  High-grade stenosis of the right vertebral artery at its origin with tandem stenoses at C6-7, C5-6, and at the dural margin.  The distal vertebral artery is essentially occluded. 2.  Mild narrowing of the proximal left vertebral artery, dominant vessel without other significant stenoses through the neck. 3.  50% stenosis of the proximal left internal carotid artery. 4.  Atherosclerotic calcifications of the proximal right internal carotid artery without significant stenosis. 5.  Tortuosity of the internal carotid arteries bilaterally as well as the left vertebral artery, likely secondary to hypertension. 6.  Moderate  spondylosis of the cervical spine.  CTA HEAD  Findings:  The postcontrast images of the head demonstrate no acute cortical infarct.  Atrophy and moderate white matter changes are again seen.  Remote subcortical white matter lacunar infarcts are noted within the left external capsule.  The postcontrast images demonstrate no pathologic enhancement.  Circumferential mucosal thickening is present in the left maxillary sinus.  There is a small fluid level in the left sphenoid sinus. The paranasal sinuses and mastoid air cells are clear.  Dense calcifications are present within the right cavernous carotid artery there is a focal stenosis of at least 50% in the supraclinoid right internal carotid artery, just proximal to the posterior communicating artery.  Similar dense calcifications are present throughout the left cavernous carotid artery with high-grade stenosis of the anterior genu there is a moderate tandem stenosis of the distal aspect of the calcifications.   Mild irregularity is present within the proximal M1 and A1 segments bilaterally without to high-grade stenosis.  The right anterior communicating artery is patent.  The MCA bifurcations are within normal limits bilaterally.  There is significant attenuation of distal MCA branch vessels bilaterally without a significant proximal stenosis or occlusion.  The basilar artery is within normal limits.  The left P1 segment originates from the basilar tip.  The right posterior cerebral artery is of fetal type.  The distal PCA branch vessels demonstrate significant attenuation.  There is no significant proximal stenosis or occlusion.   Review of the MIP images confirms the above findings.  IMPRESSION:  1.  High-grade stenosis within the cavernous left internal carotid artery. 2.  Moderate supraclinoid internal carotid artery stenoses bilaterally. 3.  Mild to moderate irregularity of the proximal A1 and M1 segments bilaterally without significant stenosis. 4.  Moderate  small vessel disease. 5.  Apart from the cavernous and supraclinoid left internal carotid artery stenoses, no other focal lesion is evident to explain the patient's symptoms.   Original Report Authenticated By: Jamesetta Orleans. MATTERN, M.D.    Dg Lumbar Spine Complete  09/25/2012  *RADIOLOGY REPORT*  Clinical Data: Back pain.  Recent fall.  LUMBAR SPINE - COMPLETE 4+ VIEW  Comparison: None  Findings: Prior cement vertebral augmentation of T11, T12, and L1 for  fracture treatment.  Moderate fracture of L3 is indeterminate in age.  Mild superior endplate fracture L4 of indeterminate age.  If the patient has acute pain in this area, consider MRI for further evaluation. Normal lumbar alignment.  Mild facet degeneration at L5-S1.  No pars defect.  2 cm right renal calculus compatible with a partial staghorn calculus  IMPRESSION: Cement vertebral augmentation at T11, T12, and L1.  Compression fractures of L3 and L4 of indeterminate age.  2 cm partial staghorn calculus in the right renal pelvis.   Original Report Authenticated By: Camelia Phenes, M.D.    Ct Head Wo Contrast  09/25/2012  *RADIOLOGY REPORT*  Clinical Data: Sudden onset of right-sided weakness and facial droop.  CT HEAD WITHOUT CONTRAST  Technique:  Contiguous axial images were obtained from the base of the skull through the vertex without contrast.  Comparison: None.  Findings: No intracranial hemorrhage.  Small vessel disease type changes.  Remote right occipital lobe and right cerebellar infarcts suspected without CT evidence of large acute infarct.  Global atrophy without hydrocephalus.  No intracranial mass lesion detected on this unenhanced exam.  Prominent vascular calcifications.  Moderate mucosal thickening left maxillary sinus.  IMPRESSION: No intracranial hemorrhage.  Small vessel disease type changes.  Remote right occipital  lobe and right cerebellar infarcts suspected without CT evidence of large acute infarct.  Critical Value/emergent  results were called by telephone at the time of interpretation on 09/25/2012 at 9:40 a.m. to Dr. Amada Jupiter, who verbally acknowledged these results.   Original Report Authenticated By: Fuller Canada, M.D.    Ct Angio Neck W/cm &/or Wo/cm  09/25/2012  *RADIOLOGY REPORT*  Clinical Data:  Stroke.  Right-sided hemiparesis and unable to speak.  Last seen normal at 5 o'clock a.m.  CT ANGIOGRAPHY HEAD AND NECK  Technique:  Multidetector CT imaging of the head and neck was performed using the standard protocol during bolus administration of intravenous contrast.  Multiplanar CT image reconstructions including MIPs were obtained to evaluate the vascular anatomy. Carotid stenosis measurements (when applicable) are obtained utilizing NASCET criteria, using the distal internal carotid diameter as the denominator.  Contrast: 50mL OMNIPAQUE IOHEXOL 350 MG/ML SOLN  Comparison:  CT head without contrast 09/25/2012.  CTA NECK  Findings:  A standard three-vessel arch configuration is present. The vertebral arteries both originate from the subclavian arteries. There is dense calcification at the origin of the nondominant right vertebral artery, suggesting high-grade stenosis.  There are tandem stenoses and C6-7 on the right and again at C5-6 on the right. There is high-grade stenosis at the dural margin of the right vertebral artery without significant contribution to the vertebral basilar junction.  Calcification at the origin of the dominant left vertebral artery is associated with mild stenosis of less than 50%.  There is some tortuosity in the remaining left vertebral artery without significant stenosis through the vertebral basilar junction.  The right to common carotid arteries within normal limits.  Dense atherosclerotic calcifications are as proximal right internal carotid artery without significant stenosis relative to the distal vessel.  The cervical ICA is tortuous without significant stenosis.  The left common  carotid artery is within normal limits.  Dense calcifications are present at the origin of the proximal left internal carotid artery.  The minimal luminal diameters 2.4 mm. Compared with a distal measurement of 4.9 mm, there is no significant stenosis.  Moderate tortuosity is present in the more distal left internal carotid artery without significant stenosis to the skull base.  The soft tissues of the neck demonstrate no focal mucosal or submucosal lesions.  No significant adenopathy is present.  Bone windows demonstrate moderate endplate degenerative changes at C3-4, C5-6, and C6-7.   Review of the MIP images confirms the above findings.  IMPRESSION:  1.  High-grade stenosis of the right vertebral artery at its origin with tandem stenoses at C6-7, C5-6, and at the dural margin.  The distal vertebral artery is essentially occluded. 2.  Mild narrowing of the proximal left vertebral artery, dominant vessel without other significant stenoses through the neck. 3.  50% stenosis of the proximal left internal carotid artery. 4.  Atherosclerotic calcifications of the proximal right internal carotid artery without significant stenosis. 5.  Tortuosity of the internal carotid arteries bilaterally as well as the left vertebral artery, likely secondary to hypertension. 6.  Moderate spondylosis of the cervical spine.  CTA HEAD  Findings:  The postcontrast images of the head demonstrate no acute cortical infarct.  Atrophy and moderate white matter changes are again seen.  Remote subcortical white matter lacunar infarcts are noted within the left external capsule.  The postcontrast images demonstrate no pathologic enhancement.  Circumferential mucosal thickening is present in the left maxillary sinus.  There is a small fluid level in the  left sphenoid sinus. The paranasal sinuses and mastoid air cells are clear.  Dense calcifications are present within the right cavernous carotid artery there is a focal stenosis of at least 50% in  the supraclinoid right internal carotid artery, just proximal to the posterior communicating artery.  Similar dense calcifications are present throughout the left cavernous carotid artery with high-grade stenosis of the anterior genu there is a moderate tandem stenosis of the distal aspect of the calcifications.   Mild irregularity is present within the proximal M1 and A1 segments bilaterally without to high-grade stenosis.  The right anterior communicating artery is patent.  The MCA bifurcations are within normal limits bilaterally.  There is significant attenuation of distal MCA branch vessels bilaterally without a significant proximal stenosis or occlusion.  The basilar artery is within normal limits.  The left P1 segment originates from the basilar tip.  The right posterior cerebral artery is of fetal type.  The distal PCA branch vessels demonstrate significant attenuation.  There is no significant proximal stenosis or occlusion.   Review of the MIP images confirms the above findings.  IMPRESSION:  1.  High-grade stenosis within the cavernous left internal carotid artery. 2.  Moderate supraclinoid internal carotid artery stenoses bilaterally. 3.  Mild to moderate irregularity of the proximal A1 and M1 segments bilaterally without significant stenosis. 4.  Moderate small vessel disease. 5.  Apart from the cavernous and supraclinoid left internal carotid artery stenoses, no other focal lesion is evident to explain the patient's symptoms.   Original Report Authenticated By: Jamesetta Orleans. MATTERN, M.D.    Mr Brain Wo Contrast  09/25/2012  *RADIOLOGY REPORT*  Clinical Data: Code stroke.  New onset of right-sided weakness.  MRI HEAD WITHOUT CONTRAST  Technique:  Multiplanar, multiecho pulse sequences of the brain and surrounding structures were obtained according to standard protocol without intravenous contrast.  Comparison: CTA head and neck 09/25/2012.  Findings: The diffusion weighted images demonstrate a  focus of restricted effusion in the posterior left frontal lobe, along the central sulcus.  There is no significant associated T2 or FLAIR hyperintensity.  Moderate generalized atrophy is present.  Mild periventricular subcortical T2 and FLAIR hyperintensities are present bilaterally.  Flow is present in the major intracranial arteries.  Multiple remote lacunar infarcts are noted within the cerebellum bilaterally, worse on the right.  The mastoid air cells are clear.  A fluid levels present in the left maxillary sinus.  Circumferential mucosal thickening is also present in the left maxillary sinus.  Remaining paranasal sinuses are clear.  IMPRESSION:  1.  Acute non hemorrhagic infarct within the posterior left frontal lobe, along the central sulcus measures less than 2 cm. 2.  Atrophy and mild diffuse white matter disease likely reflects the sequelae of chronic microvascular ischemia. 3.  Remote lacunar infarcts of the cerebellum bilaterally. 4.  Acute left maxillary sinus disease.  These results were called by telephone on 09/25/2012 at 12:40 p.m. to Dr. Ignacia Palma, who verbally acknowledged these results.   Original Report Authenticated By: Jamesetta Orleans. MATTERN, M.D.      Other results:  EKG (09/25/2012) - Atrial Fibrillation, irregular rate of approximately 80 bpm, normal axis, ST segments: normal.     Assessment & Plan: Mr. Severe is an 78 yo gentleman with multiple comorbidities, including HTN, DM, CHF, CAD, a fib, PVD, and L BKA who presents with right-sided weakness and aphasia secondary to an acute ischemic stroke.   Ischemic stroke - patient presents with acute right-sided weakness and aphasia.  CT head negative for hemorrhage. MRI revealed acute non-hemorrhagic infarct within the posterior left frontal lobe. CTA of head and neck reveal high-grade stenosis in the cavernous left internal carotid artery. Neuro consulted, appreciate recs. Patient initially presented with RUE paresis and complete  expressive aphasia but now has 5/5 strength in RUE, 3/5 strength in the R hand, and can verbalize some words. He has no trouble with comprehension. Patient is at high risk for an thromboembolic stroke as he has chronic A fib and stopped his warfarin x 4 days, and he has CAD, hypertension, and DM. - admit to floor on tele - monitor vitals - neuro checks  - check A1c, FLP - aspirin 325mg  - lipitor 80mg  - PT/OT/SLP --2D echo - keep NPO  Back pain - Xray spine show compression fractures at L3 and L4 of indeterminate age. Patient had two falls last week, the most recent on Thursday when he slipped in the bathroom. He denies LOC or head trauma. On exam patient is tender to palpation on the left paravertebral region at lower thoracic/upper lumbar spine. - monitor - pain management with PO analgesics  Hypertension - BP as high as ~170/90 in the ED.  On norvasc, losartan, metoprolol as outpatient. - hold anti-HTNs in setting of acute stroke  Diabetes mellitus - checking A1c. On novolog 3-6U tid as an outpatient. Patient sees his endocrinologist Dr. Tedd Sias for this issue.  - SSI  Chronic A fib - Per neuro, should hold warfarin in the acute phase of his stroke, but this will need to be restarted later. Dr. Marcina Millard is the patient's cardiologist with whom he follows for this issue.   - hold warfarin  DVT PPX - SCDs     Signed: Elenor Legato, M.D. PGY-I, Internal Medicine Resident Pager: [phone] (7AM-5PM) 09/25/2012, 6:59 PM

## 2012-09-25 NOTE — ED Notes (Addendum)
Neurologist in room at this time with Whittier Sink, RN assessing pt. Pt beginning to verbalize answers.

## 2012-09-25 NOTE — ED Notes (Signed)
Report given to [place], RN in CDU

## 2012-09-25 NOTE — ED Provider Notes (Signed)
History     CSN: [id number]  Arrival date & time 09/25/12  0917   First MD Initiated Contact with Patient 09/25/12 [phone]      Chief Complaint  Patient presents with  . Code Stroke    (Consider location/radiation/quality/duration/timing/severity/associated sxs/prior treatment) HPI Comments: Is an 78 year old man who apparently was well at 5 AM. At 8 AM he was found to have a right facial droop, difficulty with speech, and right-sided weakness. He was brought to [facility] cone as a code stroke. He says with garbled speech that he is diabetic, has had a coronary artery bypass grafting surgery, has had left below the knee amputation, and has had prior hiatal hernia surgery. Other review of systems was able to be obtained because of his difficulty with speech.  Patient is a 78 y.o. male presenting with Acute Neurological Problem.  Cerebrovascular Accident This is a new (Pt was apparently all right at 5 A.M., and at 8 A.M. was noted to have right facial droop, incomprehensible speech, and right sided weakness.  He was brought to [facility] ED as a Code Stroke.  He says that he is diabetic. ) problem. The current episode started 1 to 2 hours ago (He has had prior CABG, prior L BK amputation, prior surgery for hiatal hernia.  ). The problem occurs constantly. The problem has been gradually improving. Pertinent negatives include no chest pain, no abdominal pain, no headaches and no shortness of breath. Nothing aggravates the symptoms. Nothing relieves the symptoms. He has tried nothing for the symptoms.    No past medical history on file.  No past surgical history on file.  No family history on file.  History  Substance Use Topics  . Smoking status: Not on file  . Smokeless tobacco: Not on file  . Alcohol Use: Not on file      Review of Systems  Unable to perform ROS: Other  Respiratory: Negative for shortness of breath.   Cardiovascular: Negative for chest pain.  Gastrointestinal:  Negative for abdominal pain.  Neurological: Negative for headaches.    Allergies  Review of patient's allergies indicates not on file.  Home Medications  No current outpatient prescriptions on file.  There were no vitals taken for this visit.  Physical Exam  Nursing note and vitals reviewed. Constitutional: He appears well-developed and well-nourished.       Early man with right facial weakness right arm and leg treatment, and expressive dysphasia.Marland Kitchen  HENT:  Head: Normocephalic and atraumatic.  Right Ear: External ear normal.  Left Ear: External ear normal.  Mouth/Throat: Oropharynx is clear and moist.  Eyes: Conjunctivae normal and EOM are normal. Pupils are equal, round, and reactive to light.  Neck: Normal range of motion. Neck supple.  Cardiovascular: Normal rate and normal heart sounds.        Irregular heart rhythm.  Pulmonary/Chest: Effort normal and breath sounds normal.  Abdominal: Soft. Bowel sounds are normal.  Musculoskeletal:       L BKA  Neurological: He is alert.       Right facial weakness, right arm and right leg weakness.  Skin: Skin is warm and dry.  Psychiatric: He has a normal mood and affect. His behavior is normal.    ED Course  Procedures (including critical care time)   Labs Reviewed  PROTIME-INR  APTT  CBC  DIFFERENTIAL  COMPREHENSIVE METABOLIC PANEL  TROPONIN I   Ct Head Wo Contrast  09/25/2012  *RADIOLOGY REPORT*  Clinical Data: Sudden onset  of right-sided weakness and facial droop.  CT HEAD WITHOUT CONTRAST  Technique:  Contiguous axial images were obtained from the base of the skull through the vertex without contrast.  Comparison: None.  Findings: No intracranial hemorrhage.  Small vessel disease type changes.  Remote right occipital lobe and right cerebellar infarcts suspected without CT evidence of large acute infarct.  Global atrophy without hydrocephalus.  No intracranial mass lesion detected on this unenhanced exam.  Prominent  vascular calcifications.  Moderate mucosal thickening left maxillary sinus.  IMPRESSION: No intracranial hemorrhage.  Small vessel disease type changes.  Remote right occipital lobe and right cerebellar infarcts suspected without CT evidence of large acute infarct.  Critical Value/emergent results were called by telephone at the time of interpretation on 09/25/2012 at 9:40 a.m. to Dr. Amada Jupiter, who verbally acknowledged these results.   Original Report Authenticated By: Fuller Canada, M.D.    9:46 AM  Date: 09/25/2012  Rate: 80  Rhythm: atrial fibrillation  QRS Axis: normal  Intervals: QT prolonged QRS:  Poor R wave progression in precordial leads suggests old anterior myocardial infarction.  ST/T Wave abnormalities: normal  Conduction Disutrbances:right bundle branch block  Narrative Interpretation: Abnormal EKG  Old EKG Reviewed: none available  9:57 AM Pt seen --> physical exam performed.  Pt seen by stroke team, no intervention planned.  MR of Brain, MRA of brain ordered.  12:57 PM Pt's MR of brain shows a small stroke.  Dr. Amada Jupiter advised that pt is not a candidate for a neuro-intervention.  Will call to get pt admitted to unassigned medicine.  Her doctors are in [place].   1. CVA (cerebral infarction)       Carleene Cooper III, MD 09/25/12 2005

## 2012-09-25 NOTE — Progress Notes (Signed)
Paged resident on call with tele admit strip information: a-fib with multiple PVC's and 3 beats non sustained Vtach. No orders at this time  Minor, Yvette Rack

## 2012-09-25 NOTE — ED Notes (Signed)
Code Stroke cancelled per Neurologist

## 2012-09-25 NOTE — H&P (Signed)
[facility] Admission Note Date: 09/25/2012  Patient name: Aaron [name] Medical record number: [id number] Date of birth: Dec 28, 1923 Age: 78 y.o. Gender: male PCP: No primary provider on file.   Chief Complaint:  Right-sided paralysis and inability to speak.    History of Present Illness:   [name] is an 78 yo man with multiple comorbidities, including HTN, DM, CHF, CAD, a fib, PVD, and L BKA who was brought to the ED by EMS after being found at 0800 by a home healthcare provider who noticed he had right-sided paralysis and aphasia.  Two hours previously she had seen him and he was able to move his right side and speak normally.  Due to the patient's relative aphasia much of the history was elicited by his daughter, who was at his bedside.  Per the daughter the patient was at his baseline last night and had no neurological deficits.  Last week he did not take is Coumadin Wed-Sat per his physician's instructions because his INR was supratherapeutic.  On Sunday his blood pressure was unusually elevated and he felt groggy.  He also had two falls last week, the most recent on Thursday when he fell in the bathroom.  He denies experiencing SOB or chest pain before this fall and reports that he slipped on a sock.  He did not hit his head or lose consciousness but does complain of left-sided back pain.  He has 24-hr home health to assist him since he had a L BKA in August of this year, but before this he was living independently and only received assistance with meal preparation.  Before today he was able to walk up to 90 feet with a walker.  When we saw him in the ED the daughter reports that his right-sided weakness had improved from the morning and he was able to verbalize some words.  He has never experienced similar symptoms in the past.           Review of Systems: Baseline hearing loss.  ROS is otherwise negative except as mentioned in the HPI.  Medical History: Past Medical History    Diagnosis Date  . Hypertension   . Diabetes mellitus   . CHF (congestive heart failure)   . Coronary artery disease   . Atrial fibrillation   . PVD (peripheral vascular disease)   . S/P CABG x 1 2008  . S/P aortic valve replacement with porcine valve 2008    Surgical History: Past Surgical History  Procedure Date  . Below knee leg amputation 07/2012  . Cataract extraction w/ intraocular lens implant   . Abdominal surgery   . Hernia repair     x5  . Back surgery     vertebroplasty  . Coronary artery bypass graft 2008    Home Medications: Current Outpatient Rx  Name Route Sig Dispense Refill  . AMLODIPINE BESYLATE 5 MG PO TABS Oral Take 5 mg by mouth 2 (two) times daily.    Marland Kitchen CALCIUM CARBONATE-VITAMIN D 600-400 MG-UNIT PO TABS Oral Take 1 tablet by mouth daily.    Marland Kitchen VITAMIN D-3 1000 UNITS PO CAPS Oral Take 1,000 Units by mouth at bedtime.    . DULOXETINE HCL 30 MG PO CPEP Oral Take 30 mg by mouth at bedtime.     Marland Kitchen HYDROCODONE-ACETAMINOPHEN PO Oral Take 1-2 tablets by mouth every 6 (six) hours as needed. For pain    . INSULIN ASPART 100 UNIT/ML [place] SOLN Subcutaneous Inject 3-6 Units into the skin  3 (three) times daily before meals. 6 units every morning, 3 units with lunch and 5 units with dinner    . LOSARTAN POTASSIUM 100 MG PO TABS Oral Take 50 mg by mouth 2 (two) times daily.    Marland Kitchen LOSARTAN POTASSIUM 50 MG PO TABS Oral Take 50 mg by mouth 2 (two) times daily.    Marland Kitchen METOPROLOL TARTRATE 50 MG PO TABS Oral Take 75 mg by mouth 2 (two) times daily.    . NYSTATIN 100000 UNIT/GM EX POWD Topical Apply topically 2 (two) times daily as needed. Apply to buttocks for rash as needed    . SIMVASTATIN 40 MG PO TABS Oral Take 40 mg by mouth every evening.    . TORSEMIDE 10 MG PO TABS Oral Take 10 mg by mouth 2 (two) times daily.     . WARFARIN SODIUM 2.5 MG PO TABS Oral Take 2.5 mg by mouth daily.      Allergies: Allergies as of 09/25/2012 - Review Complete 09/25/2012  Allergen Reaction  Noted  . Metformin and related Hives 09/25/2012  . Penicillins Hives and Swelling 09/25/2012    Family History: Family history not reviewed.  Social History: History   Social History  . Marital Status: Married    Spouse Name: N/A    Number of Children: N/A  . Years of Education: N/A   Occupational History  . Retired Art gallery manager    Social History Main Topics  . Smoking status: Former Games developer  . Smokeless tobacco: Current User  . Alcohol Use: No  . Drug Use: No  . Sexually Active: Not on file   Other Topics Concern  . Not on file   Social History Narrative  . No narrative on file    Physical Exam: Vitals: T 37 P 89 R 15 BP 148/91 SpO2 99% General: Pleasant lying in bed in NAD.  Skin: Bruising on dorsum of hands. Darkening of skin on R LE. Head: Normocephalic, atraumatic.  Right-sided mouth droop.  Eyes: PERRLA. EOMI. No scleral icterus. Ears: Normal gross hearing bilaterally. Mouth: Non-erythematous.  No tongue deviation.  Uvula elevates symmetrically. CV: Regular rate, irregular rhythm.  No m/r/g. No carotid bruits. Resp: Normal work of breathing.  CTAB. Abd: Soft, non-tender, non-distended.  Hypoactive bowel sounds. No bruits. MSK/Ext: L BKA. See neuro exam for strength assessment.  Left-sided back tenderness to palpation. Right DP and PT pulses 1+. RLE is warm with dark and dry skin. Neuro: Right-sided mouth/lower facial weakness.  CN II-XII otherwise intact bilaterally.  RUE 4/5 strength but R hand 3/5 strength and cannot open hand.  LUE 5/5 strength.  RLE and LLE 5/5 strength.  Sensation normal and equal bilaterally. Abnormal finger-to-nose and RAM on the right secondary to weakness.  Normal FTN and RAM on the left.  2+ R patellar reflex; unable to elicit a reflex on the L BKA.      Lab results: --BMP: 137/3.8/100/26/20/1.30<177 Ca 1.12 --CBC: 11.7>12.4/38.6<360 --ALP 96 ALB 2.9 AST 21 ALT 10 total protein 7.2 Tbili 0.4 --troponin I <0.30; troponin i poc  0.02  Imaging results:  Ct Angio Head W/cm &/or Wo Cm  09/25/2012  *RADIOLOGY REPORT*  Clinical Data:  Stroke.  Right-sided hemiparesis and unable to speak.  Last seen normal at 5 o'clock a.m.  CT ANGIOGRAPHY HEAD AND NECK  Technique:  Multidetector CT imaging of the head and neck was performed using the standard protocol during bolus administration of intravenous contrast.  Multiplanar CT image reconstructions including MIPs were obtained to  evaluate the vascular anatomy. Carotid stenosis measurements (when applicable) are obtained utilizing NASCET criteria, using the distal internal carotid diameter as the denominator.  Contrast: 50mL OMNIPAQUE IOHEXOL 350 MG/ML SOLN  Comparison:  CT head without contrast 09/25/2012.  CTA NECK  Findings:  A standard three-vessel arch configuration is present. The vertebral arteries both originate from the subclavian arteries. There is dense calcification at the origin of the nondominant right vertebral artery, suggesting high-grade stenosis.  There are tandem stenoses and C6-7 on the right and again at C5-6 on the right. There is high-grade stenosis at the dural margin of the right vertebral artery without significant contribution to the vertebral basilar junction.  Calcification at the origin of the dominant left vertebral artery is associated with mild stenosis of less than 50%.  There is some tortuosity in the remaining left vertebral artery without significant stenosis through the vertebral basilar junction.  The right to common carotid arteries within normal limits.  Dense atherosclerotic calcifications are as proximal right internal carotid artery without significant stenosis relative to the distal vessel.  The cervical ICA is tortuous without significant stenosis.  The left common carotid artery is within normal limits.  Dense calcifications are present at the origin of the proximal left internal carotid artery.  The minimal luminal diameters 2.4 mm. Compared with a  distal measurement of 4.9 mm, there is no significant stenosis.  Moderate tortuosity is present in the more distal left internal carotid artery without significant stenosis to the skull base.  The soft tissues of the neck demonstrate no focal mucosal or submucosal lesions.  No significant adenopathy is present.  Bone windows demonstrate moderate endplate degenerative changes at C3-4, C5-6, and C6-7.   Review of the MIP images confirms the above findings.  IMPRESSION:  1.  High-grade stenosis of the right vertebral artery at its origin with tandem stenoses at C6-7, C5-6, and at the dural margin.  The distal vertebral artery is essentially occluded. 2.  Mild narrowing of the proximal left vertebral artery, dominant vessel without other significant stenoses through the neck. 3.  50% stenosis of the proximal left internal carotid artery. 4.  Atherosclerotic calcifications of the proximal right internal carotid artery without significant stenosis. 5.  Tortuosity of the internal carotid arteries bilaterally as well as the left vertebral artery, likely secondary to hypertension. 6.  Moderate spondylosis of the cervical spine.  CTA HEAD  Findings:  The postcontrast images of the head demonstrate no acute cortical infarct.  Atrophy and moderate white matter changes are again seen.  Remote subcortical white matter lacunar infarcts are noted within the left external capsule.  The postcontrast images demonstrate no pathologic enhancement.  Circumferential mucosal thickening is present in the left maxillary sinus.  There is a small fluid level in the left sphenoid sinus. The paranasal sinuses and mastoid air cells are clear.  Dense calcifications are present within the right cavernous carotid artery there is a focal stenosis of at least 50% in the supraclinoid right internal carotid artery, just proximal to the posterior communicating artery.  Similar dense calcifications are present throughout the left cavernous carotid artery  with high-grade stenosis of the anterior genu there is a moderate tandem stenosis of the distal aspect of the calcifications.   Mild irregularity is present within the proximal M1 and A1 segments bilaterally without to high-grade stenosis.  The right anterior communicating artery is patent.  The MCA bifurcations are within normal limits bilaterally.  There is significant attenuation of distal MCA branch  vessels bilaterally without a significant proximal stenosis or occlusion.  The basilar artery is within normal limits.  The left P1 segment originates from the basilar tip.  The right posterior cerebral artery is of fetal type.  The distal PCA branch vessels demonstrate significant attenuation.  There is no significant proximal stenosis or occlusion.   Review of the MIP images confirms the above findings.  IMPRESSION:  1.  High-grade stenosis within the cavernous left internal carotid artery. 2.  Moderate supraclinoid internal carotid artery stenoses bilaterally. 3.  Mild to moderate irregularity of the proximal A1 and M1 segments bilaterally without significant stenosis. 4.  Moderate small vessel disease. 5.  Apart from the cavernous and supraclinoid left internal carotid artery stenoses, no other focal lesion is evident to explain the patient's symptoms.   Original Report Authenticated By: Jamesetta Orleans. MATTERN, M.D.    Ct Head Wo Contrast  09/25/2012  *RADIOLOGY REPORT*  Clinical Data: Sudden onset of right-sided weakness and facial droop.  CT HEAD WITHOUT CONTRAST  Technique:  Contiguous axial images were obtained from the base of the skull through the vertex without contrast.  Comparison: None.  Findings: No intracranial hemorrhage.  Small vessel disease type changes.  Remote right occipital lobe and right cerebellar infarcts suspected without CT evidence of large acute infarct.  Global atrophy without hydrocephalus.  No intracranial mass lesion detected on this unenhanced exam.  Prominent vascular  calcifications.  Moderate mucosal thickening left maxillary sinus.  IMPRESSION: No intracranial hemorrhage.  Small vessel disease type changes.  Remote right occipital lobe and right cerebellar infarcts suspected without CT evidence of large acute infarct.  Critical Value/emergent results were called by telephone at the time of interpretation on 09/25/2012 at 9:40 a.m. to Dr. Amada Jupiter, who verbally acknowledged these results.   Original Report Authenticated By: Fuller Canada, M.D.    Ct Angio Neck W/cm &/or Wo/cm  09/25/2012  *RADIOLOGY REPORT*  Clinical Data:  Stroke.  Right-sided hemiparesis and unable to speak.  Last seen normal at 5 o'clock a.m.  CT ANGIOGRAPHY HEAD AND NECK  Technique:  Multidetector CT imaging of the head and neck was performed using the standard protocol during bolus administration of intravenous contrast.  Multiplanar CT image reconstructions including MIPs were obtained to evaluate the vascular anatomy. Carotid stenosis measurements (when applicable) are obtained utilizing NASCET criteria, using the distal internal carotid diameter as the denominator.  Contrast: 50mL OMNIPAQUE IOHEXOL 350 MG/ML SOLN  Comparison:  CT head without contrast 09/25/2012.  CTA NECK  Findings:  A standard three-vessel arch configuration is present. The vertebral arteries both originate from the subclavian arteries. There is dense calcification at the origin of the nondominant right vertebral artery, suggesting high-grade stenosis.  There are tandem stenoses and C6-7 on the right and again at C5-6 on the right. There is high-grade stenosis at the dural margin of the right vertebral artery without significant contribution to the vertebral basilar junction.  Calcification at the origin of the dominant left vertebral artery is associated with mild stenosis of less than 50%.  There is some tortuosity in the remaining left vertebral artery without significant stenosis through the vertebral basilar junction.  The  right to common carotid arteries within normal limits.  Dense atherosclerotic calcifications are as proximal right internal carotid artery without significant stenosis relative to the distal vessel.  The cervical ICA is tortuous without significant stenosis.  The left common carotid artery is within normal limits.  Dense calcifications are present at the origin of  the proximal left internal carotid artery.  The minimal luminal diameters 2.4 mm. Compared with a distal measurement of 4.9 mm, there is no significant stenosis.  Moderate tortuosity is present in the more distal left internal carotid artery without significant stenosis to the skull base.  The soft tissues of the neck demonstrate no focal mucosal or submucosal lesions.  No significant adenopathy is present.  Bone windows demonstrate moderate endplate degenerative changes at C3-4, C5-6, and C6-7.   Review of the MIP images confirms the above findings.  IMPRESSION:  1.  High-grade stenosis of the right vertebral artery at its origin with tandem stenoses at C6-7, C5-6, and at the dural margin.  The distal vertebral artery is essentially occluded. 2.  Mild narrowing of the proximal left vertebral artery, dominant vessel without other significant stenoses through the neck. 3.  50% stenosis of the proximal left internal carotid artery. 4.  Atherosclerotic calcifications of the proximal right internal carotid artery without significant stenosis. 5.  Tortuosity of the internal carotid arteries bilaterally as well as the left vertebral artery, likely secondary to hypertension. 6.  Moderate spondylosis of the cervical spine.  CTA HEAD  Findings:  The postcontrast images of the head demonstrate no acute cortical infarct.  Atrophy and moderate white matter changes are again seen.  Remote subcortical white matter lacunar infarcts are noted within the left external capsule.  The postcontrast images demonstrate no pathologic enhancement.  Circumferential mucosal  thickening is present in the left maxillary sinus.  There is a small fluid level in the left sphenoid sinus. The paranasal sinuses and mastoid air cells are clear.  Dense calcifications are present within the right cavernous carotid artery there is a focal stenosis of at least 50% in the supraclinoid right internal carotid artery, just proximal to the posterior communicating artery.  Similar dense calcifications are present throughout the left cavernous carotid artery with high-grade stenosis of the anterior genu there is a moderate tandem stenosis of the distal aspect of the calcifications.   Mild irregularity is present within the proximal M1 and A1 segments bilaterally without to high-grade stenosis.  The right anterior communicating artery is patent.  The MCA bifurcations are within normal limits bilaterally.  There is significant attenuation of distal MCA branch vessels bilaterally without a significant proximal stenosis or occlusion.  The basilar artery is within normal limits.  The left P1 segment originates from the basilar tip.  The right posterior cerebral artery is of fetal type.  The distal PCA branch vessels demonstrate significant attenuation.  There is no significant proximal stenosis or occlusion.   Review of the MIP images confirms the above findings.  IMPRESSION:  1.  High-grade stenosis within the cavernous left internal carotid artery. 2.  Moderate supraclinoid internal carotid artery stenoses bilaterally. 3.  Mild to moderate irregularity of the proximal A1 and M1 segments bilaterally without significant stenosis. 4.  Moderate small vessel disease. 5.  Apart from the cavernous and supraclinoid left internal carotid artery stenoses, no other focal lesion is evident to explain the patient's symptoms.   Original Report Authenticated By: Jamesetta Orleans. MATTERN, M.D.    Mr Brain Wo Contrast  09/25/2012  *RADIOLOGY REPORT*  Clinical Data: Code stroke.  New onset of right-sided weakness.  MRI HEAD  WITHOUT CONTRAST  Technique:  Multiplanar, multiecho pulse sequences of the brain and surrounding structures were obtained according to standard protocol without intravenous contrast.  Comparison: CTA head and neck 09/25/2012.  Findings: The diffusion weighted images demonstrate a  focus of restricted effusion in the posterior left frontal lobe, along the central sulcus.  There is no significant associated T2 or FLAIR hyperintensity.  Moderate generalized atrophy is present.  Mild periventricular subcortical T2 and FLAIR hyperintensities are present bilaterally.  Flow is present in the major intracranial arteries.  Multiple remote lacunar infarcts are noted within the cerebellum bilaterally, worse on the right.  The mastoid air cells are clear.  A fluid levels present in the left maxillary sinus.  Circumferential mucosal thickening is also present in the left maxillary sinus.  Remaining paranasal sinuses are clear.  IMPRESSION:  1.  Acute non hemorrhagic infarct within the posterior left frontal lobe, along the central sulcus measures less than 2 cm. 2.  Atrophy and mild diffuse white matter disease likely reflects the sequelae of chronic microvascular ischemia. 3.  Remote lacunar infarcts of the cerebellum bilaterally. 4.  Acute left maxillary sinus disease.  These results were called by telephone on 09/25/2012 at 12:40 p.m. to Dr. Ignacia Palma, who verbally acknowledged these results.   Original Report Authenticated By: Jamesetta Orleans. MATTERN, M.D.     Other results: EKG Results:  09/25/2012 Rate:  80 PR:  -- QRS: 132 ms QTc:  512 ms EKG: Atrial fibrillation with RBBB and anteroseptal infarct   Assessment & Plan by Problem:  Mr. Carden is an 78 yo gentleman with multiple comorbidities, including HTN, DM, CHF, CAD, a fib, PVD, and L BKA who presents with right-sided weakness and aphasia secondary to an acute ischemic stroke.  #Ischemic stroke  Presentation of acute right-sided weakness and aphasia  suggests an ischemic stroke in the left MCA territory.  This is confirmed by non-contrast MRI of the brain that shows acute-non hemorrhagic infarct within the posterior left frontal lobe. CTA of head and neck reveal high-grade stenosis in the cavernous left internal carotid artery  Non-contrast head CT negative for hemorrhage.  Neuro consulted, appreciate recs.  Patient initially presented with RUE paralysis and complete expressive aphasia but now has 4/5 strength in the proximal RUE, 3/5 strength in the hand, and can verbalize some words.  He has no trouble with comprehension.  Patient is at high risk for an embolic or thrombotic stroke because he has chronic a fib and stopped his warfarin x 4 days, and he has CAD, hypertension, and DM. --failed ED swallow study.  F/u speech eval in the morning. --ASA therapy 300mg  PR if fails swallow screen 325mg  PO if passes, would hold coumadin in the acute phase of his stroke but this will need to be restarted later.  --HgbA1c, fasting lipid panel pending --PT consult, OT consult, Speech consult  --2D Echocardiogram  --No need for Carotid dopplers given CTA neck already done.  --Risk factor modification  --Telemetry monitoring  --Frequent neuro checks, q2hrs x 12 hrs then q4hrs x 12 hrs --atorvastatin 80 mg po once daily  #Back pain Xray spine show compression fractures at L3 and L4 of indeterminate age.  Patient had two falls last week, the most recent on Thursday when he slipped on a sock in the bathroom.  He denies LOC or head trauma.  On exam patient is tender to palpation on the left paravertebral region at lower thoracic/upper lumbar spine.  #Hypertension BPs have been 140-170s over 80-90s in the ED.  Home regimen: amlodipine 5 mg po BID, losartan 50 mg po BID, metoprolol 75 mg po BID --Will not treat hypertension at this time in the setting of acute ischemic stroke.  #Diabetes mellitus --SSI with  aspart while in house  #Chronic A fib --Per neuro,  should hold warfarin in the acute phase of his stroke, but this will need to be restarted later.  #FEN/GI --NPO. Advance diet after per speech recs tomorrow. --IVF NS @ 100 mL/hr.  D/C once patient can tolerate po intake, especially with his h/o CHF.  #PPX --SCDs   Signed by:  Vanessa Ralphs, MS3  **This is a medical student admission note**  09/25/2012, 3:25 PM

## 2012-09-25 NOTE — ED Notes (Signed)
Pt to MRI for scan. Family in room

## 2012-09-25 NOTE — ED Notes (Signed)
EDP in room at this time 

## 2012-09-26 DIAGNOSIS — I4891 Unspecified atrial fibrillation: Secondary | ICD-10-CM

## 2012-09-26 DIAGNOSIS — I059 Rheumatic mitral valve disease, unspecified: Secondary | ICD-10-CM

## 2012-09-26 DIAGNOSIS — Z954 Presence of other heart-valve replacement: Secondary | ICD-10-CM

## 2012-09-26 DIAGNOSIS — I6330 Cerebral infarction due to thrombosis of unspecified cerebral artery: Secondary | ICD-10-CM

## 2012-09-26 DIAGNOSIS — I6350 Cerebral infarction due to unspecified occlusion or stenosis of unspecified cerebral artery: Principal | ICD-10-CM

## 2012-09-26 DIAGNOSIS — E119 Type 2 diabetes mellitus without complications: Secondary | ICD-10-CM

## 2012-09-26 LAB — GLUCOSE, CAPILLARY
Glucose-Capillary: 110 mg/dL — ABNORMAL HIGH (ref 70–99)
Glucose-Capillary: 164 mg/dL — ABNORMAL HIGH (ref 70–99)
Glucose-Capillary: 202 mg/dL — ABNORMAL HIGH (ref 70–99)

## 2012-09-26 LAB — BASIC METABOLIC PANEL
BUN: 15 mg/dL (ref 6–23)
CO2: 22 mEq/L (ref 19–32)
GFR calc non Af Amer: 75 mL/min — ABNORMAL LOW (ref >90)
Glucose, Bld: 193 mg/dL — ABNORMAL HIGH (ref 70–99)
Potassium: 4.4 mEq/L (ref 3.5–5.1)

## 2012-09-26 LAB — LIPID PANEL: Cholesterol: 132 mg/dL (ref 0–200)

## 2012-09-26 MED ORDER — BISACODYL 10 MG RE SUPP
10.0000 mg | Freq: Every day | RECTAL | Status: DC | PRN
  Administered 2012-09-26: 10 mg via RECTAL
  Filled 2012-09-26: qty 1

## 2012-09-26 MED ORDER — DABIGATRAN ETEXILATE MESYLATE 150 MG PO CAPS
150.0000 mg | ORAL_CAPSULE | Freq: Two times a day (BID) | ORAL | Status: DC
  Administered 2012-09-26 (×2): 150 mg via ORAL
  Filled 2012-09-26 (×4): qty 1

## 2012-09-26 MED ORDER — FENTANYL 25 MCG/HR TD PT72
50.0000 ug | MEDICATED_PATCH | TRANSDERMAL | Status: DC
  Administered 2012-09-26: 50 ug via TRANSDERMAL
  Filled 2012-09-26 (×2): qty 1

## 2012-09-26 MED ORDER — KETOROLAC TROMETHAMINE 30 MG/ML IJ SOLN: 30.0000 mg | Freq: Four times a day (QID) | INTRAMUSCULAR | Status: DC | PRN

## 2012-09-26 MED ORDER — ACETAMINOPHEN 325 MG PO TABS
650.0000 mg | ORAL_TABLET | Freq: Four times a day (QID) | ORAL | Status: DC | PRN
  Administered 2012-09-28 – 2012-10-02 (×3): 650 mg via ORAL
  Filled 2012-09-26 (×3): qty 2

## 2012-09-26 MED ORDER — PANTOPRAZOLE SODIUM 40 MG PO TBEC
40.0000 mg | DELAYED_RELEASE_TABLET | Freq: Every day | ORAL | Status: DC
  Administered 2012-09-26: 40 mg via ORAL
  Filled 2012-09-26: qty 1

## 2012-09-26 MED ORDER — ONDANSETRON HCL 4 MG/2ML IJ SOLN
4.0000 mg | Freq: Four times a day (QID) | INTRAMUSCULAR | Status: DC | PRN
  Administered 2012-09-26 – 2012-09-29 (×3): 4 mg via INTRAVENOUS
  Filled 2012-09-26 (×3): qty 2

## 2012-09-26 MED ORDER — POTASSIUM CHLORIDE 10 MEQ/100ML IV SOLN
10.0000 meq | Freq: Once | INTRAVENOUS | Status: AC
  Administered 2012-09-26: 10 meq via INTRAVENOUS
  Filled 2012-09-26: qty 100

## 2012-09-26 MED ORDER — ASPIRIN 81 MG PO CHEW
81.0000 mg | CHEWABLE_TABLET | Freq: Once | ORAL | Status: AC
  Administered 2012-09-26: 81 mg via ORAL
  Filled 2012-09-26: qty 1

## 2012-09-26 MED ORDER — ONDANSETRON HCL 4 MG/2ML IJ SOLN
4.0000 mg | Freq: Four times a day (QID) | INTRAMUSCULAR | Status: DC
  Administered 2012-09-26: 4 mg via INTRAVENOUS
  Filled 2012-09-26: qty 2

## 2012-09-26 NOTE — Progress Notes (Signed)
Rehab Admissions Coordinator Note:  Patient was screened by Lutricia Horsfall,  for appropriateness for an Inpatient Acute Rehab Consult.  At this time, we are recommending Inpatient Rehab consult.  Meryl Dare 09/26/2012, 2:59 PM  I can be reached at [phone].

## 2012-09-26 NOTE — Consult Note (Signed)
Physical Medicine and Rehabilitation Consult Reason for Consult: right sided weakness and difficulty speaking. Referring Physician:  Dr. Lonzo Cloud.   HPI: Aaron [name] is a 78 y.o. RH-male with history of DM, L-AKA, A Fib, admitted on 09/25/12 with inability to move right side and inability to speak. Patient on chronic coumadin that had been held recently and INR sub-therapeutic at admission.  CT head revealing remote right occipital and cerebellar infarcts with global atrophy. CTA head/neck with high grade stenosis of R-VA, distal VA essentially occluded, 50% stenosis prox L-ICA, moderate supraclinoid ICA stenosis bilaterally. MRI brain done revealing acute infarct posterior left frontal lobe. Patient had improvement of symptoms in ED. Was able to start speaking and move RUE. 2 D echo done revealing mild LVH, EF 65-70%, bioprosthetic AV present. Patient has had gradual worsening of symptoms overnight. ST evaluation showed mild oral phase dysphagia and patient placed on D3 diet. PT evaluation done revealing balance deficits and problems with mobility. Neurology recommends changing patient to pradaxa for cardio embolic stroke.  for MD, therapy team recommending CIR.   Review of Systems  HENT: Negative for hearing loss.        Dry and sore mouth.  Eyes: Negative for blurred vision and double vision.  Respiratory: Negative for cough and shortness of breath.   Cardiovascular: Negative for chest pain and palpitations.  Gastrointestinal: Positive for nausea, vomiting and constipation. Negative for abdominal pain.  Genitourinary: Negative for urgency and frequency.  Musculoskeletal: Positive for back pain (muscle spasms.) and falls (fall a week ago striking mid back.).  Neurological: Positive for speech change and focal weakness. Negative for dizziness and headaches.  Psychiatric/Behavioral:       Frustrated due to dry mouth, nausea, RUE weakness.   Past Medical History  Diagnosis Date  . Hypertension     . Diabetes mellitus   . CHF (congestive heart failure)   . Coronary artery disease   . Atrial fibrillation   . PVD (peripheral vascular disease)   . S/P CABG x 1 2008  . S/P aortic valve replacement with porcine valve 2008   Past Surgical History  Procedure Date  . Below knee leg amputation 07/2012  . Cataract extraction w/ intraocular lens implant   . Abdominal surgery   . Hernia repair     x5  . Back surgery     vertebroplasty  . Coronary artery bypass graft 2008   History reviewed. No pertinent family history.  Social History:  Single. Has hired caregivers who provide supervision, assist with ADLs and home management. Family reports he could hop up to 90 feet at time of discharge from [facility].  He reports that he has quit smoking. He uses smokeless tobacco. He reports that he does not drink alcohol or use illicit drugs.   Allergies  Allergen Reactions  . Metformin And Related Hives  . Penicillins Hives and Swelling   Medications Prior to Admission  Medication Sig Dispense Refill  . amLODipine (NORVASC) 5 MG tablet Take 5 mg by mouth 2 (two) times daily.      . Calcium Carbonate-Vitamin D (CALCIUM 600+D) 600-400 MG-UNIT per tablet Take 1 tablet by mouth daily.      . Cholecalciferol (VITAMIN D-3) 1000 UNITS CAPS Take 1,000 Units by mouth at bedtime.      . DULoxetine (CYMBALTA) 30 MG capsule Take 30 mg by mouth at bedtime.       Marland Kitchen HYDROCODONE-ACETAMINOPHEN PO Take 1-2 tablets by mouth every 6 (six) hours as needed.  For pain      . insulin aspart (NOVOLOG) 100 UNIT/ML injection Inject 3-6 Units into the skin 3 (three) times daily before meals. 6 units every morning, 3 units with lunch and 5 units with dinner      . losartan (COZAAR) 100 MG tablet Take 50 mg by mouth 2 (two) times daily.      Marland Kitchen losartan (COZAAR) 50 MG tablet Take 50 mg by mouth 2 (two) times daily.      . metoprolol (LOPRESSOR) 50 MG tablet Take 75 mg by mouth 2 (two) times daily.      Marland Kitchen nystatin  (MYCOSTATIN/NYSTOP) 100000 UNIT/GM POWD Apply topically 2 (two) times daily as needed. Apply to buttocks for rash as needed      . simvastatin (ZOCOR) 40 MG tablet Take 40 mg by mouth every evening.      . torsemide (DEMADEX) 10 MG tablet Take 10 mg by mouth 2 (two) times daily.       Marland Kitchen warfarin (COUMADIN) 2.5 MG tablet Take 2.5 mg by mouth daily.        Home: Home Living Lives With: Alone Available Help at Discharge: Personal care attendant;Available 24 hours/day Type of Home: House Home Access: Ramped entrance Home Layout: One level Bathroom Shower/Tub: Engineer, manufacturing systems: Standard Home Adaptive Equipment: Walker - rolling;Bedside commode/3-in-1;Tub transfer bench;Wheelchair - manual Additional Comments: Pt had L BKA  in August 2013 per daugther report and has [facility] until about 10 days ago.  Functional History: Prior Function Able to Take Stairs?: No Driving: No Vocation: Retired Functional Status:  Mobility: Bed Mobility Bed Mobility: Supine to Sit Supine to Sit: 4: Min assist Transfers Transfers: Sit to Stand;Stand to Sit;Squat Pivot Transfers Sit to Stand: 1: +2 Total assist;With upper extremity assist;From bed Sit to Stand: Patient Percentage: 50% Stand to Sit: 1: +2 Total assist;With upper extremity assist;To bed Stand to Sit: Patient Percentage: 60% Squat Pivot Transfers: 1: +2 Total assist;With upper extremity assistance (bed->chair) Squat Pivot Transfers: Patient Percentage: 70% Ambulation/Gait Ambulation/Gait Assistance: Not tested (comment)    ADL:    Cognition: Cognition Arousal/Alertness: Awake/alert Orientation Level: Oriented X4 Cognition Overall Cognitive Status: Appears within functional limits for tasks assessed/performed Arousal/Alertness: Awake/alert Orientation Level: Appears intact for tasks assessed Behavior During Session: [facility] for tasks performed  Blood pressure 113/59, pulse 126, temperature 97 F (36.1 C),  temperature source Oral, resp. rate 18, height 5\' 8"  (1.727 m), weight 67.2 kg (148 lb 2.4 oz), SpO2 98.00%. Physical Exam  Constitutional: He is oriented to person, place, and time. He appears well-developed and well-nourished.  HENT:  Head: Normocephalic and atraumatic.  Eyes: Pupils are equal, round, and reactive to light.  Neck: Normal range of motion. Neck supple.  Cardiovascular: An irregularly irregular rhythm present. Tachycardia present.   Pulmonary/Chest: Effort normal and breath sounds normal.  Abdominal: Soft. Bowel sounds are normal. He exhibits no distension. There is no tenderness.  Musculoskeletal: He exhibits edema (right hand).  Neurological: He is alert and oriented to person, place, and time.       Alert. Easily frustrated. Moderately dysarthric speech. Able to follow basic commands without difficulty.  Needed some cueing to recall DOB and current month. RUE 0/5, RLE 3-4/5. No gross sensory abnl. Right 7 and tongue deviation.   Skin:       Left BK generally healed with bruise/opsite noted. Mild breakdown only    Results for orders placed during the hospital encounter of 09/25/12 (from the past  24 hour(s))  GLUCOSE, CAPILLARY     Status: Abnormal   Collection Time   09/25/12  6:06 PM      Component Value Range   Glucose-Capillary 167 (*) 70 - 99 mg/dL  GLUCOSE, CAPILLARY     Status: Abnormal   Collection Time   09/25/12  9:29 PM      Component Value Range   Glucose-Capillary 115 (*) 70 - 99 mg/dL  GLUCOSE, CAPILLARY     Status: Abnormal   Collection Time   09/26/12 12:46 AM      Component Value Range   Glucose-Capillary 110 (*) 70 - 99 mg/dL   Comment 1 Documented in Chart     Comment 2 Notify RN    GLUCOSE, CAPILLARY     Status: Abnormal   Collection Time   09/26/12  4:03 AM      Component Value Range   Glucose-Capillary 164 (*) 70 - 99 mg/dL   Comment 1 Documented in Chart     Comment 2 Notify RN    LIPID PANEL     Status: Abnormal   Collection Time    09/26/12  6:25 AM      Component Value Range   Cholesterol 132  0 - 200 mg/dL   Triglycerides 782  <956 mg/dL   HDL 31 (*) >21 mg/dL   Total CHOL/HDL Ratio 4.3     VLDL 27  0 - 40 mg/dL   LDL Cholesterol 74  0 - 99 mg/dL  BASIC METABOLIC PANEL     Status: Abnormal   Collection Time   09/26/12  6:25 AM      Component Value Range   Sodium 135  135 - 145 mEq/L   Potassium 4.4  3.5 - 5.1 mEq/L   Chloride 98  96 - 112 mEq/L   CO2 22  19 - 32 mEq/L   Glucose, Bld 193 (*) 70 - 99 mg/dL   BUN 15  6 - 23 mg/dL   Creatinine, Ser 3.08  0.50 - 1.35 mg/dL   Calcium 9.1  8.4 - 65.7 mg/dL   GFR calc non Af Amer 75 (*) >90 mL/min   GFR calc Af Amer 86 (*) >90 mL/min  MAGNESIUM     Status: Normal   Collection Time   09/26/12  6:25 AM      Component Value Range   Magnesium 2.2  1.5 - 2.5 mg/dL  GLUCOSE, CAPILLARY     Status: Abnormal   Collection Time   09/26/12  8:15 AM      Component Value Range   Glucose-Capillary 202 (*) 70 - 99 mg/dL  GLUCOSE, CAPILLARY     Status: Abnormal   Collection Time   09/26/12 12:08 PM      Component Value Range   Glucose-Capillary 291 (*) 70 - 99 mg/dL   Ct Angio Head W/cm &/or Wo Cm  09/25/2012  *RADIOLOGY REPORT*  Clinical Data:  Stroke.  Right-sided hemiparesis and unable to speak.  Last seen normal at 5 o'clock a.m.  CT ANGIOGRAPHY HEAD AND NECK  Technique:  Multidetector CT imaging of the head and neck was performed using the standard protocol during bolus administration of intravenous contrast.  Multiplanar CT image reconstructions including MIPs were obtained to evaluate the vascular anatomy. Carotid stenosis measurements (when applicable) are obtained utilizing NASCET criteria, using the distal internal carotid diameter as the denominator.  Contrast: 50mL OMNIPAQUE IOHEXOL 350 MG/ML SOLN  Comparison:  CT head without contrast 09/25/2012.  CTA  NECK  Findings:  A standard three-vessel arch configuration is present. The vertebral arteries both originate from the  subclavian arteries. There is dense calcification at the origin of the nondominant right vertebral artery, suggesting high-grade stenosis.  There are tandem stenoses and C6-7 on the right and again at C5-6 on the right. There is high-grade stenosis at the dural margin of the right vertebral artery without significant contribution to the vertebral basilar junction.  Calcification at the origin of the dominant left vertebral artery is associated with mild stenosis of less than 50%.  There is some tortuosity in the remaining left vertebral artery without significant stenosis through the vertebral basilar junction.  The right to common carotid arteries within normal limits.  Dense atherosclerotic calcifications are as proximal right internal carotid artery without significant stenosis relative to the distal vessel.  The cervical ICA is tortuous without significant stenosis.  The left common carotid artery is within normal limits.  Dense calcifications are present at the origin of the proximal left internal carotid artery.  The minimal luminal diameters 2.4 mm. Compared with a distal measurement of 4.9 mm, there is no significant stenosis.  Moderate tortuosity is present in the more distal left internal carotid artery without significant stenosis to the skull base.  The soft tissues of the neck demonstrate no focal mucosal or submucosal lesions.  No significant adenopathy is present.  Bone windows demonstrate moderate endplate degenerative changes at C3-4, C5-6, and C6-7.   Review of the MIP images confirms the above findings.  IMPRESSION:  1.  High-grade stenosis of the right vertebral artery at its origin with tandem stenoses at C6-7, C5-6, and at the dural margin.  The distal vertebral artery is essentially occluded. 2.  Mild narrowing of the proximal left vertebral artery, dominant vessel without other significant stenoses through the neck. 3.  50% stenosis of the proximal left internal carotid artery. 4.   Atherosclerotic calcifications of the proximal right internal carotid artery without significant stenosis. 5.  Tortuosity of the internal carotid arteries bilaterally as well as the left vertebral artery, likely secondary to hypertension. 6.  Moderate spondylosis of the cervical spine.  CTA HEAD  Findings:  The postcontrast images of the head demonstrate no acute cortical infarct.  Atrophy and moderate white matter changes are again seen.  Remote subcortical white matter lacunar infarcts are noted within the left external capsule.  The postcontrast images demonstrate no pathologic enhancement.  Circumferential mucosal thickening is present in the left maxillary sinus.  There is a small fluid level in the left sphenoid sinus. The paranasal sinuses and mastoid air cells are clear.  Dense calcifications are present within the right cavernous carotid artery there is a focal stenosis of at least 50% in the supraclinoid right internal carotid artery, just proximal to the posterior communicating artery.  Similar dense calcifications are present throughout the left cavernous carotid artery with high-grade stenosis of the anterior genu there is a moderate tandem stenosis of the distal aspect of the calcifications.   Mild irregularity is present within the proximal M1 and A1 segments bilaterally without to high-grade stenosis.  The right anterior communicating artery is patent.  The MCA bifurcations are within normal limits bilaterally.  There is significant attenuation of distal MCA branch vessels bilaterally without a significant proximal stenosis or occlusion.  The basilar artery is within normal limits.  The left P1 segment originates from the basilar tip.  The right posterior cerebral artery is of fetal type.  The distal PCA  branch vessels demonstrate significant attenuation.  There is no significant proximal stenosis or occlusion.   Review of the MIP images confirms the above findings.  IMPRESSION:  1.  High-grade  stenosis within the cavernous left internal carotid artery. 2.  Moderate supraclinoid internal carotid artery stenoses bilaterally. 3.  Mild to moderate irregularity of the proximal A1 and M1 segments bilaterally without significant stenosis. 4.  Moderate small vessel disease. 5.  Apart from the cavernous and supraclinoid left internal carotid artery stenoses, no other focal lesion is evident to explain the patient's symptoms.   Original Report Authenticated By: Jamesetta Orleans. MATTERN, M.D.    Dg Lumbar Spine Complete  09/25/2012  *RADIOLOGY REPORT*  Clinical Data: Back pain.  Recent fall.  LUMBAR SPINE - COMPLETE 4+ VIEW  Comparison: None  Findings: Prior cement vertebral augmentation of T11, T12, and L1 for  fracture treatment.  Moderate fracture of L3 is indeterminate in age.  Mild superior endplate fracture L4 of indeterminate age.  If the patient has acute pain in this area, consider MRI for further evaluation. Normal lumbar alignment.  Mild facet degeneration at L5-S1.  No pars defect.  2 cm right renal calculus compatible with a partial staghorn calculus  IMPRESSION: Cement vertebral augmentation at T11, T12, and L1.  Compression fractures of L3 and L4 of indeterminate age.  2 cm partial staghorn calculus in the right renal pelvis.   Original Report Authenticated By: Camelia Phenes, M.D.    Ct Head Wo Contrast  09/25/2012  *RADIOLOGY REPORT*  Clinical Data: Sudden onset of right-sided weakness and facial droop.  CT HEAD WITHOUT CONTRAST  Technique:  Contiguous axial images were obtained from the base of the skull through the vertex without contrast.  Comparison: None.  Findings: No intracranial hemorrhage.  Small vessel disease type changes.  Remote right occipital lobe and right cerebellar infarcts suspected without CT evidence of large acute infarct.  Global atrophy without hydrocephalus.  No intracranial mass lesion detected on this unenhanced exam.  Prominent vascular calcifications.  Moderate  mucosal thickening left maxillary sinus.  IMPRESSION: No intracranial hemorrhage.  Small vessel disease type changes.  Remote right occipital lobe and right cerebellar infarcts suspected without CT evidence of large acute infarct.  Critical Value/emergent results were called by telephone at the time of interpretation on 09/25/2012 at 9:40 a.m. to Dr. Amada Jupiter, who verbally acknowledged these results.   Original Report Authenticated By: Fuller Canada, M.D.    Ct Angio Neck W/cm &/or Wo/cm  09/25/2012  *RADIOLOGY REPORT*  Clinical Data:  Stroke.  Right-sided hemiparesis and unable to speak.  Last seen normal at 5 o'clock a.m.  CT ANGIOGRAPHY HEAD AND NECK  Technique:  Multidetector CT imaging of the head and neck was performed using the standard protocol during bolus administration of intravenous contrast.  Multiplanar CT image reconstructions including MIPs were obtained to evaluate the vascular anatomy. Carotid stenosis measurements (when applicable) are obtained utilizing NASCET criteria, using the distal internal carotid diameter as the denominator.  Contrast: 50mL OMNIPAQUE IOHEXOL 350 MG/ML SOLN  Comparison:  CT head without contrast 09/25/2012.  CTA NECK  Findings:  A standard three-vessel arch configuration is present. The vertebral arteries both originate from the subclavian arteries. There is dense calcification at the origin of the nondominant right vertebral artery, suggesting high-grade stenosis.  There are tandem stenoses and C6-7 on the right and again at C5-6 on the right. There is high-grade stenosis at the dural margin of the right vertebral artery without  significant contribution to the vertebral basilar junction.  Calcification at the origin of the dominant left vertebral artery is associated with mild stenosis of less than 50%.  There is some tortuosity in the remaining left vertebral artery without significant stenosis through the vertebral basilar junction.  The right to common carotid  arteries within normal limits.  Dense atherosclerotic calcifications are as proximal right internal carotid artery without significant stenosis relative to the distal vessel.  The cervical ICA is tortuous without significant stenosis.  The left common carotid artery is within normal limits.  Dense calcifications are present at the origin of the proximal left internal carotid artery.  The minimal luminal diameters 2.4 mm. Compared with a distal measurement of 4.9 mm, there is no significant stenosis.  Moderate tortuosity is present in the more distal left internal carotid artery without significant stenosis to the skull base.  The soft tissues of the neck demonstrate no focal mucosal or submucosal lesions.  No significant adenopathy is present.  Bone windows demonstrate moderate endplate degenerative changes at C3-4, C5-6, and C6-7.   Review of the MIP images confirms the above findings.  IMPRESSION:  1.  High-grade stenosis of the right vertebral artery at its origin with tandem stenoses at C6-7, C5-6, and at the dural margin.  The distal vertebral artery is essentially occluded. 2.  Mild narrowing of the proximal left vertebral artery, dominant vessel without other significant stenoses through the neck. 3.  50% stenosis of the proximal left internal carotid artery. 4.  Atherosclerotic calcifications of the proximal right internal carotid artery without significant stenosis. 5.  Tortuosity of the internal carotid arteries bilaterally as well as the left vertebral artery, likely secondary to hypertension. 6.  Moderate spondylosis of the cervical spine.  CTA HEAD  Findings:  The postcontrast images of the head demonstrate no acute cortical infarct.  Atrophy and moderate white matter changes are again seen.  Remote subcortical white matter lacunar infarcts are noted within the left external capsule.  The postcontrast images demonstrate no pathologic enhancement.  Circumferential mucosal thickening is present in the  left maxillary sinus.  There is a small fluid level in the left sphenoid sinus. The paranasal sinuses and mastoid air cells are clear.  Dense calcifications are present within the right cavernous carotid artery there is a focal stenosis of at least 50% in the supraclinoid right internal carotid artery, just proximal to the posterior communicating artery.  Similar dense calcifications are present throughout the left cavernous carotid artery with high-grade stenosis of the anterior genu there is a moderate tandem stenosis of the distal aspect of the calcifications.   Mild irregularity is present within the proximal M1 and A1 segments bilaterally without to high-grade stenosis.  The right anterior communicating artery is patent.  The MCA bifurcations are within normal limits bilaterally.  There is significant attenuation of distal MCA branch vessels bilaterally without a significant proximal stenosis or occlusion.  The basilar artery is within normal limits.  The left P1 segment originates from the basilar tip.  The right posterior cerebral artery is of fetal type.  The distal PCA branch vessels demonstrate significant attenuation.  There is no significant proximal stenosis or occlusion.   Review of the MIP images confirms the above findings.  IMPRESSION:  1.  High-grade stenosis within the cavernous left internal carotid artery. 2.  Moderate supraclinoid internal carotid artery stenoses bilaterally. 3.  Mild to moderate irregularity of the proximal A1 and M1 segments bilaterally without significant stenosis. 4.  Moderate small vessel disease. 5.  Apart from the cavernous and supraclinoid left internal carotid artery stenoses, no other focal lesion is evident to explain the patient's symptoms.   Original Report Authenticated By: Jamesetta Orleans. MATTERN, M.D.    Mr Brain Wo Contrast  09/25/2012  *RADIOLOGY REPORT*  Clinical Data: Code stroke.  New onset of right-sided weakness.  MRI HEAD WITHOUT CONTRAST  Technique:   Multiplanar, multiecho pulse sequences of the brain and surrounding structures were obtained according to standard protocol without intravenous contrast.  Comparison: CTA head and neck 09/25/2012.  Findings: The diffusion weighted images demonstrate a focus of restricted effusion in the posterior left frontal lobe, along the central sulcus.  There is no significant associated T2 or FLAIR hyperintensity.  Moderate generalized atrophy is present.  Mild periventricular subcortical T2 and FLAIR hyperintensities are present bilaterally.  Flow is present in the major intracranial arteries.  Multiple remote lacunar infarcts are noted within the cerebellum bilaterally, worse on the right.  The mastoid air cells are clear.  A fluid levels present in the left maxillary sinus.  Circumferential mucosal thickening is also present in the left maxillary sinus.  Remaining paranasal sinuses are clear.  IMPRESSION:  1.  Acute non hemorrhagic infarct within the posterior left frontal lobe, along the central sulcus measures less than 2 cm. 2.  Atrophy and mild diffuse white matter disease likely reflects the sequelae of chronic microvascular ischemia. 3.  Remote lacunar infarcts of the cerebellum bilaterally. 4.  Acute left maxillary sinus disease.  These results were called by telephone on 09/25/2012 at 12:40 p.m. to Dr. Ignacia Palma, who verbally acknowledged these results.   Original Report Authenticated By: Jamesetta Orleans. MATTERN, M.D.     Assessment/Plan: Diagnosis: left frontal lobe infarct, recent left BKA 1. Does the need for close, 24 hr/day medical supervision in concert with the patient's rehab needs make it unreasonable for this patient to be served in a less intensive setting? Yes 2. Co-Morbidities requiring supervision/potential complications: dm, htn, cad 3. Due to bladder management, bowel management, safety, skin/wound care, disease management, medication administration, pain management and patient education, does  the patient require 24 hr/day rehab nursing? Yes 4. Does the patient require coordinated care of a physician, rehab nurse, PT (1-2 hrs/day, 5 days/week), OT (1-2 hrs/day, 5 days/week) and SLP (1-2 hrs/day, 5 days/week) to address physical and functional deficits in the context of the above medical diagnosis(es)? Yes Addressing deficits in the following areas: balance, endurance, locomotion, strength, transferring, bowel/bladder control, bathing, dressing, feeding, grooming, toileting, cognition, speech, language, swallowing and psychosocial support 5. Can the patient actively participate in an intensive therapy program of at least 3 hrs of therapy per day at least 5 days per week? Yes and Potentially 6. The potential for patient to make measurable gains while on inpatient rehab is good 7. Anticipated functional outcomes upon discharge from inpatient rehab are min to mod assist  with PT, mod to max assist with OT, min assist with SLP. 8. Estimated rehab length of stay to reach the above functional goals is: 2-3 weeks 9. Does the patient have adequate social supports to accommodate these discharge functional goals? Yes 10. Anticipated D/C setting: Home 11. Anticipated post D/C treatments: HH therapy 12. Overall Rehab/Functional Prognosis: good  RECOMMENDATIONS: This patient's condition is appropriate for continued rehabilitative care in the following setting: CIR Patient has agreed to participate in recommended program. Yes Note that insurance prior authorization may be required for reimbursement for recommended care.  Comment: Pt  appears to have a solid family support network. He was only home for about 10 days after his SNF stay post left BKA. Therapy would focus on improving swallow and communication, decreasing assistance needed for basic transfers and mobility, pt ed and family ed for self-care, pain mgt, etc. Rehab RN to follow up.   Ivory Broad, MD     09/26/2012

## 2012-09-26 NOTE — Evaluation (Signed)
Physical Therapy Evaluation Patient Details Name: Aaron [name] MRN: [id number] DOB: 06-30-1924 Today's Date: 09/26/2012 Time: 4098-1191 PT Time Calculation (min): 33 min  PT Assessment / Plan / Recommendation Clinical Impression  Aaron [name] is 78 y/o male with recent history of LLE AKA admitted to [facility] with aphasia and RUE weakness. Presents to PT today with RUE weakness in addition to generalized weakness and decreased activity tolerance affecting pt's independence with mobility. Will benefit physical therapy in the acute setting to address the below deficits so as to maximize function and decrease BOC at next venue.     PT Assessment  Patient needs continued PT services    Follow Up Recommendations  Post acute inpatient    Does the patient have the potential to tolerate intense rehabilitation   Yes, Recommend IP Rehab Screening  Barriers to Discharge        Equipment Recommendations  None recommended by PT    Recommendations for Other Services Rehab consult   Frequency Min 4X/week    Precautions / Restrictions Precautions Precautions: Fall Restrictions Other Position/Activity Restrictions: LLE BKA         Mobility  Bed Mobility Bed Mobility: Supine to Sit Supine to Sit: 4: Min assist Details for Bed Mobility Assistance: increased effort secondary to RUE weakness, sequencing cues as well as minA for follow through and ease of movement Transfers Transfers: Sit to Stand;Stand to Sit;Squat Pivot Transfers Sit to Stand: 1: +2 Total assist;With upper extremity assist;From bed Sit to Stand: Patient Percentage: 50% Stand to Sit: 1: +2 Total assist;With upper extremity assist;To bed Stand to Sit: Patient Percentage: 60% Squat Pivot Transfers: 1: +2 Total assist;With upper extremity assistance (bed->chair) Squat Pivot Transfers: Patient Percentage: 70% Details for Transfer Assistance: cues for technique, bilateral support for stability, pt stood with posterior lean and  unable to bring trunk securely over his RLE so pt sat with assist; performed squat pivot well by reaching for the arm rests on the chair and pivoting with facilitation from therapists for anteior translation of trunk over BOS so as to maintain tunk clearance over bed rails and arm rest as well as to guide hips to chair Ambulation/Gait Ambulation/Gait Assistance: Not tested (comment) Modified Rankin (Stroke Patients Only) Pre-Morbid Rankin Score: Moderately severe disability Modified Rankin: Severe disability    Shoulder Instructions     Exercises     PT Diagnosis: Difficulty walking;Abnormality of gait;Generalized weakness;Hemiplegia dominant side  PT Problem List: Decreased strength;Decreased activity tolerance;Decreased balance;Decreased mobility;Pain PT Treatment Interventions: DME instruction;Gait training;Functional mobility training;Therapeutic activities;Balance training;Neuromuscular re-education;Patient/family education;Wheelchair mobility training   PT Goals Acute Rehab PT Goals PT Goal Formulation: With patient Time For Goal Achievement: 10/03/12 Potential to Achieve Goals: Good Pt will go Supine/Side to Sit: with modified independence PT Goal: Supine/Side to Sit - Progress: Goal set today Pt will Sit at Edge of Bed: with modified independence;3-5 min PT Goal: Sit at Edge Of Bed - Progress: Goal set today Pt will go Sit to Stand: with mod assist PT Goal: Sit to Stand - Progress: Goal set today Pt will go Stand to Sit: with mod assist PT Goal: Stand to Sit - Progress: Goal set today Pt will Transfer Bed to Chair/Chair to Bed: with mod assist PT Transfer Goal: Bed to Chair/Chair to Bed - Progress: Goal set today Pt will Stand: 3 - 5 min;with bilateral upper extremity support;with mod assist PT Goal: Stand - Progress: Goal set today Pt will Ambulate: 1 - 15 feet;with mod assist;with least restrictive assistive device  PT Goal: Ambulate - Progress: Goal set today Pt will  Perform Home Exercise Program: with supervision, verbal cues required/provided PT Goal: Perform Home Exercise Program - Progress: Goal set today  Visit Information  Last PT Received On: 09/26/12 Assistance Needed: +2    Subjective Data  Subjective: My back is tired.  Patient Stated Goal: To get to a wheelchair   Prior Functioning  Home Living Lives With: Alone Available Help at Discharge: Personal care attendant;Available 24 hours/day Type of Home: House Home Access: Ramped entrance Home Layout: One level Bathroom Shower/Tub: Engineer, manufacturing systems: Standard Home Adaptive Equipment: Walker - rolling;Bedside commode/3-in-1;Tub transfer bench;Wheelchair - manual Additional Comments: Pt had L BKA  in August 2013 per daugther report and [facility] about 10 [facility]. Prior Function Level of Independence: Needs assistance Needs Assistance: Gait Gait Assistance: Daughter reports he was using RW to "bunny hop" after his amputation but with caregiver present Able to Take Stairs?: No Driving: No Vocation: Retired Musician: Expressive difficulties    Cognition  Overall Cognitive Status: Appears within functional limits for tasks assessed/performed Arousal/Alertness: Awake/alert Orientation Level: Appears intact for tasks assessed Behavior During Session: [facility] for tasks performed    Extremity/Trunk Assessment Right Upper Extremity Assessment RUE ROM/Strength/Tone:  (see OT note) Left Upper Extremity Assessment LUE ROM/Strength/Tone:  (see OT note) Right Lower Extremity Assessment RLE ROM/Strength/Tone: Deficits RLE ROM/Strength/Tone Deficits: generally weak, grossly 4/5 RLE Sensation: WFL - Light Touch Left Lower Extremity Assessment LLE ROM/Strength/Tone: WFL for tasks assessed;Deficits LLE ROM/Strength/Tone Deficits: pt with recent BKA LLE Sensation: WFL - Light Touch Trunk Assessment Trunk Assessment: Kyphotic   Balance  Balance Balance Assessed: Yes Static Sitting Balance Static Sitting - Balance Support: Bilateral upper extremity supported Static Sitting - Level of Assistance: 5: Stand by assistance;4: Min assist Static Sitting - Comment/# of Minutes: at times become fatigued and falls posteriorly but able to correct by reaching for bed rail with LUE  Dynamic Sitting Balance Dynamic Sitting - Balance Support: Bilateral upper extremity supported Dynamic Sitting - Level of Assistance: 4: Min assist Dynamic Sitting - Comments: falls posteriorly with MMT of lower extremities  End of Session PT - End of Session Equipment Utilized During Treatment: Gait belt Activity Tolerance: Patient tolerated treatment well;Patient limited by fatigue Patient left: in chair;with call bell/phone within reach Nurse Communication: Mobility status  GP     [facility] HELEN 09/26/2012, 2:38 PM

## 2012-09-26 NOTE — Progress Notes (Signed)
Stroke Team Progress Note  HISTORY Aaron [name] is an 78 y.o. male awoke 09/25/2012 am with right-sided weakness and aphasia. He had been seen by his caregiver at 6 AM when she checked his glucose. At that time, he was able to talk and move his right side well. At 8 AM when she rechecked on him, he was not moving his right side or talking. She called 911.   On arrival here, he had some improvement with strength in his right arm. Over the course of my examination, he began speaking. Of note, he was recently found to be subtherapeutic on his Coumadin, and therefore it was held for 4 days. He was restarted on Sunday.  Patient was not a TPA candidate secondary to presenting outside the window. He was admitted to for further evaluation and treatment.  SUBJECTIVE His daughter is at the bedside.  Overall he feels his condition is gradually worsening, now unable to squeeze with his hand per his daughter. Patient has 24h care at home.  OBJECTIVE Most recent Vital Signs: Filed Vitals:   09/25/12 2200 09/26/12 0104 09/26/12 0321 09/26/12 0506  BP: 157/91 146/104 188/105 168/92  Pulse: 65 84 95 105  Temp: 98.4 F (36.9 C) 98.3 F (36.8 C) 98.1 F (36.7 C) 97.9 F (36.6 C)  TempSrc: Oral Oral Oral Oral  Resp: 20 20 18 18   Height:   5\' 8"  (1.727 m)   Weight:   67.2 kg (148 lb 2.4 oz)   SpO2: 95% 99% 98% 98%   CBG (last 3)   Basename 09/26/12 0403 09/26/12 0046 09/25/12 2129  GLUCAP 164* 110* 115*   Intake/Output from previous day: 10/08 0701 - 10/09 0700 In: -  Out: 550 [Urine:550]  IV Fluid Intake:     . sodium chloride 75 mL/hr at 09/25/12 1817    MEDICATIONS    . aspirin  300 mg Rectal Q2000   Or  . aspirin  325 mg Oral Q2000  . atorvastatin  80 mg Oral q1800  . DULoxetine  30 mg Oral QHS  . fentaNYL  50 mcg Transdermal Q72H  . influenza  inactive virus vaccine  0.5 mL Intramuscular Tomorrow-1000  . insulin aspart  0-9 Units Subcutaneous TID WC  . magnesium sulfate 1 - 4 g  bolus IVPB  2 g Intravenous Once  . ondansetron (ZOFRAN) IV  4 mg Intravenous Q6H  . potassium chloride  10 mEq Intravenous Q1 Hr x 2  . potassium chloride  10 mEq Intravenous Once   PRN:  iohexol  Diet:  Dysphagia 3 thin liquids Activity:  Bedrest DVT Prophylaxis:  SCDs   CLINICALLY SIGNIFICANT STUDIES Basic Metabolic Panel:  Lab 09/26/12 1610 09/25/12 1122 09/25/12 0945  NA 135 137 --  K 4.4 3.8 --  CL 98 100 --  CO2 22 -- 26  GLUCOSE 193* 177* --  BUN 15 20 --  CREATININE 0.91 1.30 --  CALCIUM 9.1 -- 9.1  MG 2.2 -- --  PHOS -- -- --   Liver Function Tests:  Lab 09/25/12 0945  AST 21  ALT 10  ALKPHOS 96  BILITOT 0.4  PROT 7.2  ALBUMIN 2.9*   CBC:  Lab 09/25/12 1122 09/25/12 1001 09/25/12 0945  WBC -- -- 11.7*  NEUTROABS -- -- 8.4*  HGB 13.9 13.6 --  HCT 41.0 40.0 --  MCV -- -- 89.4  PLT -- -- 360   Coagulation:  Lab 09/25/12 0930  LABPROT 16.4*  INR 1.35   Cardiac Enzymes:  Lab 09/25/12 0945  CKTOTAL --  CKMB --  CKMBINDEX --  TROPONINI <0.30   Urinalysis: No results found for this basename: COLORURINE:2,APPERANCEUR:2,LABSPEC:2,PHURINE:2,GLUCOSEU:2,HGBUR:2,BILIRUBINUR:2,KETONESUR:2,PROTEINUR:2,UROBILINOGEN:2,NITRITE:2,LEUKOCYTESUR:2 in the last 168 hours Lipid Panel    Component Value Date/Time   CHOL 132 09/26/2012 0625   TRIG 135 09/26/2012 0625   HDL 31* 09/26/2012 0625   CHOLHDL 4.3 09/26/2012 0625   VLDL 27 09/26/2012 0625   LDLCALC 74 09/26/2012 0625   HgbA1C  Lab Results  Component Value Date   HGBA1C 8.0* 09/25/2012    Urine Drug Screen:   No results found for this basename: labopia, cocainscrnur, labbenz, amphetmu, thcu, labbarb    Alcohol Level: No results found for this basename: ETH:2 in the last 168 hours  CT of the brain  09/25/2012  No intracranial hemorrhage.  Small vessel disease type changes.  Remote right occipital lobe and right cerebellar infarcts suspected without CT evidence of large acute infarct.    Ct Angio Head  09/25/2012   1.  High-grade stenosis within the cavernous left internal carotid artery. 2.  Moderate supraclinoid internal carotid artery stenoses bilaterally. 3.  Mild to moderate irregularity of the proximal A1 and M1 segments bilaterally without significant stenosis. 4.  Moderate small vessel disease. 5.  Apart from the cavernous and supraclinoid left internal carotid artery stenoses, no other focal lesion is evident to explain the patient's symptoms.    Dg Lumbar Spine Complete 09/25/2012   Cement vertebral augmentation at T11, T12, and L1.  Compression fractures of L3 and L4 of indeterminate age.  2 cm partial staghorn calculus in the right renal pelvis.     Ct Angio Neck 09/25/2012   1.  High-grade stenosis of the right vertebral artery at its origin with tandem stenoses at C6-7, C5-6, and at the dural margin.  The distal vertebral artery is essentially occluded. 2.  Mild narrowing of the proximal left vertebral artery, dominant vessel without other significant stenoses through the neck. 3.  50% stenosis of the proximal left internal carotid artery. 4.  Atherosclerotic calcifications of the proximal right internal carotid artery without significant stenosis. 5.  Tortuosity of the internal carotid arteries bilaterally as well as the left vertebral artery, likely secondary to hypertension. 6.  Moderate spondylosis of the cervical spine.   MRI of the brain  09/25/2012 1.  Acute non hemorrhagic infarct within the posterior left frontal lobe, along the central sulcus measures less than 2 cm. 2.  Atrophy and mild diffuse white matter disease likely reflects the sequelae of chronic microvascular ischemia. 3.  Remote lacunar infarcts of the cerebellum bilaterally. 4.  Acute left maxillary sinus disease.  MRA of the brain  See CTA head  2D Echocardiogram    Carotid Doppler  See CTA neck  EKG  atrial fibrillation, rate 92.   Therapy Recommendations PT - ; OT - ; ST -   Physical Exam   Elderly Caucasian  male currently not in distress.Awake alert. Afebrile. Head is nontraumatic. Neck is supple without bruit. Hearing is normal. Cardiac exam no murmur or gallop. Lungs are clear to auscultation. Distal pulses are well felt except Left leg below-knee amputation with bandage. Neurological Exam : awake alert and moderate expressive aphasia with speaking only a few words some occasional short sentences and difficult to understand. Follows midline and one step commands only. Extraocular moments are full range without nystagmus. Fundi were not visualized. Diminished blink to threat on the right compared to the left. Mild right lower facial weakness. Tongue midline.  Significant right hemiplegia with 0/5 strength in the right upper extremity and 1/5 in right lower extremity. Good antigravity strength in the left. Coordination sensation cannot be reliably tested. Gait was not tested. ASSESSMENT [name] is a 78 y.o. male presenting with right hemiparesis and aphasia. Imaging confirms a  posterior left frontal lobe infarct. Infarct felt to be embolic secondary to known atrial fibrillation.  On warfarin prior to admission, though INR subtherapeutic on arrival at 1.35 after coumadin being held x 4 days, resumed on Sunday prior to admission. Now on aspirin 325 mg orally every day for secondary stroke prevention. Patient with resultant right hemiparesis, aphaisa.   Porcine valve atrial fibrillation Hypertension Diabetes, HgbA1c 8.0 CAD PVD LDL 74  Hospital day # 1  TREATMENT/PLAN  Add Pradaxa 150 mg bid for secondary stroke prevention.  OOB, Therapy evals  Resume low dose aspirin daily  F/u 2D Discuss with Dr. Duard Larsen, MSN, RN, ANVP-BC, ANP-BC, GNP-BC [facility] Pager: [phone] 09/26/2012 10:28 AM  Scribe for Dr. Delia Heady, Stroke Center Medical Director, who has personally reviewed chart, pertinent data, examined the patient and developed the plan of  care. Pager:  [phone]

## 2012-09-26 NOTE — Progress Notes (Signed)
Pt has runs of V-tach and MD on call was notified. Pt was asymptomatic. New orders was carried out and given. Will continue to monitor.  MD was informed also @ 0330 am that pt has periods of multiple runs of V-tach over the night non-sustained. BP also was 188/105 and there was no new orders given. Pt has no  pain or discomfort. Will monitor.

## 2012-09-26 NOTE — Evaluation (Signed)
Clinical/Bedside Swallow Evaluation Patient Details  Name: Aaron [name] MRN: [id number] Date of Birth: 1924/10/02  Today's Date: 09/26/2012 Time: 0925-0950 SLP Time Calculation (min): 25 min  Past Medical History:  Past Medical History  Diagnosis Date  . Hypertension   . Diabetes mellitus   . CHF (congestive heart failure)   . Coronary artery disease   . Atrial fibrillation   . PVD (peripheral vascular disease)   . S/P CABG x 1 2008  . S/P aortic valve replacement with porcine valve 2008   Past Surgical History:  Past Surgical History  Procedure Date  . Below knee leg amputation 07/2012  . Cataract extraction w/ intraocular lens implant   . Abdominal surgery   . Hernia repair     x5  . Back surgery     vertebroplasty  . Coronary artery bypass graft 2008   HPI:  78 y/o male with h/x of DM, HTN, CAD. Previously on Dys 3 diet during prior hospitlization (not [facility]) and thin liquids (did not require MBS per daughter). Admitted to [facility] on 10/8, presented with R facial droop, difficulty with speech, and right sided weakness. MRI showed, acute non hemorrhagic infarct within posterior left frontal lobe and remote lacunar infarcts.   Assessment / Plan / Recommendation Clinical Impression  Pt. seen for bedside swallow evaluation. Pt.'s daughter present at bedside, reports he has been feeling nauseous and in pain. Oral phase marked by reduced labial seal resulting in minimal anterior loss of bolus and right oral pocketing observed with apple sauce consistency; Pt. able to clear right sulci residue with min verbal cues. Mastication appeared [facility], however Pt. unable to transit cracker posteriorly as he began gagging from nausea and expectorated entire bolus. No s/s of aspiration observed at bedside with all consistencies. SLP recommends Dys. 3 diet with thin liquids (straws okay). Will continue to follow to evaluate swallow safety with recommended diet.     Aspiration Risk  Mild (mild-mod)      Diet Recommendation Dysphagia 3 (Mechanical Soft);Thin liquid   Liquid Administration via: Cup;Straw Medication Administration: Whole meds with liquid Supervision: Intermittent supervision to cue for compensatory strategies;Patient able to self feed Compensations: Slow rate;Small sips/bites;Check for pocketing;Check for anterior loss Postural Changes and/or Swallow Maneuvers: Upright 30-60 min after meal;Seated upright 90 degrees    Other  Recommendations Oral Care Recommendations: Oral care BID   Follow Up Recommendations       Frequency and Duration min 2x/week  2 weeks       SLP Swallow Goals Patient will consume recommended diet without observed clinical signs of aspiration with: Minimal cueing Patient will utilize recommended strategies during swallow to increase swallowing safety with: Minimal cueing   Swallow Study Prior Functional Status       General Date of Onset: 09/25/12 HPI: 78 y/o male with h/x of DM, HTN, CAD. Previously on Dys 3 diet during prior hospitlization (not [facility]) and thin liquids (did not require MBS per daughter). Admitted to [facility] on 10/8, presented with R facial droop, difficulty with speech, and right sided weakness. MRI showed, acute non hemorrhagic infarct within posterior left frontal lobe and remote lacunar infarcts. Type of Study: Bedside swallow evaluation Diet Prior to this Study: NPO Temperature Spikes Noted: No Respiratory Status: Room air History of Recent Intubation: No Behavior/Cognition: Cooperative;Alert;Hard of hearing Oral Cavity - Dentition: Poor condition Self-Feeding Abilities: Able to feed self;Needs assist Patient Positioning: Upright in bed Baseline Vocal Quality: Low vocal intensity Volitional Cough: Strong Volitional Swallow: Able to  elicit    Oral/Motor/Sensory Function Overall Oral Motor/Sensory Function: Impaired Labial ROM: Reduced right;Reduced left Labial Symmetry: Within Functional Limits Labial Strength:  Reduced Labial Sensation: Reduced Lingual ROM: Reduced right;Reduced left Lingual Symmetry: Within Functional Limits Lingual Strength: Reduced Lingual Sensation: Reduced Facial ROM: Reduced right Facial Symmetry: Within Functional Limits Facial Strength: Reduced Facial Sensation: Reduced Velum: Within Functional Limits Mandible: Within Functional Limits   Ice Chips Ice chips: Within functional limits Presentation: Spoon   Thin Liquid Thin Liquid: Impaired Presentation: Cup Oral Phase Impairments: Reduced labial seal Oral Phase Functional Implications: Right anterior spillage    Nectar Thick Nectar Thick Liquid: Not tested   Honey Thick Honey Thick Liquid: Not tested   Puree Puree: Impaired Presentation: Spoon Oral Phase Functional Implications: Right lateral sulci pocketing   Solid   GO    Solid: Within functional limits (Pt. gagged and expectorated bolus, nauseated prior ) Presentation: Self Fed       Aaron [name] 09/26/2012,10:44 AM

## 2012-09-26 NOTE — Progress Notes (Signed)
Medical Student Daily Progress Note   Subjective:    Interval Events:  Tele events overnight: 6 beats V tach x 2, and 4 beats V tach.  Asymptomatic each time; no interventions.  Patient was nauseated this morning and c/o thoracic back pain that is worse than yesterday.      Objective:    Vital Signs:   Temp:  [97.9 F (36.6 C)-98.6 F (37 C)] 97.9 F (36.6 C) (10/09 0506) Pulse Rate:  [65-105] 105  (10/09 0506) Resp:  [12-20] 18  (10/09 0506) BP: (146-188)/(70-105) 168/92 mmHg (10/09 0506) SpO2:  [87 %-100 %] 98 % (10/09 0506) Weight:  [67.2 kg (148 lb 2.4 oz)] 67.2 kg (148 lb 2.4 oz) (10/09 0321)     Weights: 24-hour Weight change:   Filed Weights   09/26/12 0321  Weight: 67.2 kg (148 lb 2.4 oz)     Intake/Output:   Intake/Output Summary (Last 24 hours) at 09/26/12 0901 Last data filed at 09/26/12 0211  Gross per 24 hour  Intake      0 ml  Output    550 ml  Net   -550 ml       Physical Exam: Vitals: T 97.9 P 105 R 18 BP 168/92 SpO2 98% General: Pleasant lying in bed in NAD.  Skin: Bruising on dorsum of hands. Darkening of skin on R LE.  Head: Normocephalic, atraumatic. Right-sided mouth droop.  Eyes: PERRLA. EOMI. No scleral icterus.  Ears: Normal gross hearing bilaterally.  Mouth: Non-erythematous. No tongue deviation. Uvula elevates symmetrically.  CV: Regular rate, irregular rhythm. No m/r/g. No carotid bruits.  Resp: Normal work of breathing. CTAB.  Abd: Soft, non-tender, non-distended. Hypoactive bowel sounds. No bruits.  MSK/Ext: L BKA. See neuro exam for strength assessment. Left-sided back tenderness to palpation. Right DP and PT pulses 1+. RLE is warm with dark and dry skin.  Neuro: Right-sided mouth/lower facial weakness. CN II-XII otherwise intact bilaterally. RUE 3/5 strength but R hand 2/5 strength and cannot open hand. LUE 5/5 strength. RLE and LLE 5/5 strength. Sensation normal and equal bilaterally. Abnormal finger-to-nose and RAM on the right  secondary to weakness. Normal FTN and RAM on the left. 2+ L brachial reflex; not able to elicit a R brachial reflex.  2+ patellar reflex bilaterally.    Labs: Basic Metabolic Panel:  Lab 09/26/12 9604 09/25/12 1122 09/25/12 1001 09/25/12 0945  NA 135 137 138 133*  K 4.4 3.8 3.8 3.9  CL 98 100 100 97  CO2 22 -- -- 26  GLUCOSE 193* 177* 185* 183*  BUN 15 20 20 19   CREATININE 0.91 1.30 1.20 1.09  CALCIUM 9.1 -- -- 9.1  MG 2.2 -- -- --  PHOS -- -- -- --    Liver Function Tests:  Lab 09/25/12 0945  AST 21  ALT 10  ALKPHOS 96  BILITOT 0.4  PROT 7.2  ALBUMIN 2.9*    CBC:  Lab 09/25/12 1122 09/25/12 1001 09/25/12 0945  WBC -- -- 11.7*  NEUTROABS -- -- 8.4*  HGB 13.9 13.6 12.4*  HCT 41.0 40.0 38.6*  MCV -- -- 89.4  PLT -- -- 360    Cardiac Enzymes:  Lab 09/25/12 0945  CKTOTAL --  CKMB --  CKMBINDEX --  TROPONINI <0.30    CBG:  Lab 09/26/12 0403 09/26/12 0046 09/25/12 2129 09/25/12 1806 09/25/12 1411  GLUCAP 164* 110* 115* 167* 172*    Coagulation Studies:  Basename 09/25/12 0930  LABPROT 16.4*  INR 1.35  Other results: EKG Results: 09/25/2012  Rate: 80  PR: --  QRS: 132 ms  QTc: 512 ms  EKG: Atrial fibrillation with RBBB and anteroseptal infarct  Imaging: Ct Angio Head W/cm &/or Wo Cm  09/25/2012  *RADIOLOGY REPORT*  Clinical Data:  Stroke.  Right-sided hemiparesis and unable to speak.  Last seen normal at 5 o'clock a.m.  CT ANGIOGRAPHY HEAD AND NECK  Technique:  Multidetector CT imaging of the head and neck was performed using the standard protocol during bolus administration of intravenous contrast.  Multiplanar CT image reconstructions including MIPs were obtained to evaluate the vascular anatomy. Carotid stenosis measurements (when applicable) are obtained utilizing NASCET criteria, using the distal internal carotid diameter as the denominator.  Contrast: 50mL OMNIPAQUE IOHEXOL 350 MG/ML SOLN  Comparison:  CT head without contrast 09/25/2012.  CTA  NECK  Findings:  A standard three-vessel arch configuration is present. The vertebral arteries both originate from the subclavian arteries. There is dense calcification at the origin of the nondominant right vertebral artery, suggesting high-grade stenosis.  There are tandem stenoses and C6-7 on the right and again at C5-6 on the right. There is high-grade stenosis at the dural margin of the right vertebral artery without significant contribution to the vertebral basilar junction.  Calcification at the origin of the dominant left vertebral artery is associated with mild stenosis of less than 50%.  There is some tortuosity in the remaining left vertebral artery without significant stenosis through the vertebral basilar junction.  The right to common carotid arteries within normal limits.  Dense atherosclerotic calcifications are as proximal right internal carotid artery without significant stenosis relative to the distal vessel.  The cervical ICA is tortuous without significant stenosis.  The left common carotid artery is within normal limits.  Dense calcifications are present at the origin of the proximal left internal carotid artery.  The minimal luminal diameters 2.4 mm. Compared with a distal measurement of 4.9 mm, there is no significant stenosis.  Moderate tortuosity is present in the more distal left internal carotid artery without significant stenosis to the skull base.  The soft tissues of the neck demonstrate no focal mucosal or submucosal lesions.  No significant adenopathy is present.  Bone windows demonstrate moderate endplate degenerative changes at C3-4, C5-6, and C6-7.   Review of the MIP images confirms the above findings.  IMPRESSION:  1.  High-grade stenosis of the right vertebral artery at its origin with tandem stenoses at C6-7, C5-6, and at the dural margin.  The distal vertebral artery is essentially occluded. 2.  Mild narrowing of the proximal left vertebral artery, dominant vessel without  other significant stenoses through the neck. 3.  50% stenosis of the proximal left internal carotid artery. 4.  Atherosclerotic calcifications of the proximal right internal carotid artery without significant stenosis. 5.  Tortuosity of the internal carotid arteries bilaterally as well as the left vertebral artery, likely secondary to hypertension. 6.  Moderate spondylosis of the cervical spine.  CTA HEAD  Findings:  The postcontrast images of the head demonstrate no acute cortical infarct.  Atrophy and moderate white matter changes are again seen.  Remote subcortical white matter lacunar infarcts are noted within the left external capsule.  The postcontrast images demonstrate no pathologic enhancement.  Circumferential mucosal thickening is present in the left maxillary sinus.  There is a small fluid level in the left sphenoid sinus. The paranasal sinuses and mastoid air cells are clear.  Dense calcifications are present within the right cavernous  carotid artery there is a focal stenosis of at least 50% in the supraclinoid right internal carotid artery, just proximal to the posterior communicating artery.  Similar dense calcifications are present throughout the left cavernous carotid artery with high-grade stenosis of the anterior genu there is a moderate tandem stenosis of the distal aspect of the calcifications.   Mild irregularity is present within the proximal M1 and A1 segments bilaterally without to high-grade stenosis.  The right anterior communicating artery is patent.  The MCA bifurcations are within normal limits bilaterally.  There is significant attenuation of distal MCA branch vessels bilaterally without a significant proximal stenosis or occlusion.  The basilar artery is within normal limits.  The left P1 segment originates from the basilar tip.  The right posterior cerebral artery is of fetal type.  The distal PCA branch vessels demonstrate significant attenuation.  There is no significant proximal  stenosis or occlusion.   Review of the MIP images confirms the above findings.  IMPRESSION:  1.  High-grade stenosis within the cavernous left internal carotid artery. 2.  Moderate supraclinoid internal carotid artery stenoses bilaterally. 3.  Mild to moderate irregularity of the proximal A1 and M1 segments bilaterally without significant stenosis. 4.  Moderate small vessel disease. 5.  Apart from the cavernous and supraclinoid left internal carotid artery stenoses, no other focal lesion is evident to explain the patient's symptoms.   Original Report Authenticated By: Jamesetta Orleans. MATTERN, M.D.    Dg Lumbar Spine Complete  09/25/2012  *RADIOLOGY REPORT*  Clinical Data: Back pain.  Recent fall.  LUMBAR SPINE - COMPLETE 4+ VIEW  Comparison: None  Findings: Prior cement vertebral augmentation of T11, T12, and L1 for  fracture treatment.  Moderate fracture of L3 is indeterminate in age.  Mild superior endplate fracture L4 of indeterminate age.  If the patient has acute pain in this area, consider MRI for further evaluation. Normal lumbar alignment.  Mild facet degeneration at L5-S1.  No pars defect.  2 cm right renal calculus compatible with a partial staghorn calculus  IMPRESSION: Cement vertebral augmentation at T11, T12, and L1.  Compression fractures of L3 and L4 of indeterminate age.  2 cm partial staghorn calculus in the right renal pelvis.   Original Report Authenticated By: Camelia Phenes, M.D.    Ct Head Wo Contrast  09/25/2012  *RADIOLOGY REPORT*  Clinical Data: Sudden onset of right-sided weakness and facial droop.  CT HEAD WITHOUT CONTRAST  Technique:  Contiguous axial images were obtained from the base of the skull through the vertex without contrast.  Comparison: None.  Findings: No intracranial hemorrhage.  Small vessel disease type changes.  Remote right occipital lobe and right cerebellar infarcts suspected without CT evidence of large acute infarct.  Global atrophy without hydrocephalus.  No  intracranial mass lesion detected on this unenhanced exam.  Prominent vascular calcifications.  Moderate mucosal thickening left maxillary sinus.  IMPRESSION: No intracranial hemorrhage.  Small vessel disease type changes.  Remote right occipital lobe and right cerebellar infarcts suspected without CT evidence of large acute infarct.  Critical Value/emergent results were called by telephone at the time of interpretation on 09/25/2012 at 9:40 a.m. to Dr. Amada Jupiter, who verbally acknowledged these results.   Original Report Authenticated By: Fuller Canada, M.D.    Ct Angio Neck W/cm &/or Wo/cm  09/25/2012  *RADIOLOGY REPORT*  Clinical Data:  Stroke.  Right-sided hemiparesis and unable to speak.  Last seen normal at 5 o'clock a.m.  CT ANGIOGRAPHY HEAD AND  NECK  Technique:  Multidetector CT imaging of the head and neck was performed using the standard protocol during bolus administration of intravenous contrast.  Multiplanar CT image reconstructions including MIPs were obtained to evaluate the vascular anatomy. Carotid stenosis measurements (when applicable) are obtained utilizing NASCET criteria, using the distal internal carotid diameter as the denominator.  Contrast: 50mL OMNIPAQUE IOHEXOL 350 MG/ML SOLN  Comparison:  CT head without contrast 09/25/2012.  CTA NECK  Findings:  A standard three-vessel arch configuration is present. The vertebral arteries both originate from the subclavian arteries. There is dense calcification at the origin of the nondominant right vertebral artery, suggesting high-grade stenosis.  There are tandem stenoses and C6-7 on the right and again at C5-6 on the right. There is high-grade stenosis at the dural margin of the right vertebral artery without significant contribution to the vertebral basilar junction.  Calcification at the origin of the dominant left vertebral artery is associated with mild stenosis of less than 50%.  There is some tortuosity in the remaining left vertebral  artery without significant stenosis through the vertebral basilar junction.  The right to common carotid arteries within normal limits.  Dense atherosclerotic calcifications are as proximal right internal carotid artery without significant stenosis relative to the distal vessel.  The cervical ICA is tortuous without significant stenosis.  The left common carotid artery is within normal limits.  Dense calcifications are present at the origin of the proximal left internal carotid artery.  The minimal luminal diameters 2.4 mm. Compared with a distal measurement of 4.9 mm, there is no significant stenosis.  Moderate tortuosity is present in the more distal left internal carotid artery without significant stenosis to the skull base.  The soft tissues of the neck demonstrate no focal mucosal or submucosal lesions.  No significant adenopathy is present.  Bone windows demonstrate moderate endplate degenerative changes at C3-4, C5-6, and C6-7.   Review of the MIP images confirms the above findings.  IMPRESSION:  1.  High-grade stenosis of the right vertebral artery at its origin with tandem stenoses at C6-7, C5-6, and at the dural margin.  The distal vertebral artery is essentially occluded. 2.  Mild narrowing of the proximal left vertebral artery, dominant vessel without other significant stenoses through the neck. 3.  50% stenosis of the proximal left internal carotid artery. 4.  Atherosclerotic calcifications of the proximal right internal carotid artery without significant stenosis. 5.  Tortuosity of the internal carotid arteries bilaterally as well as the left vertebral artery, likely secondary to hypertension. 6.  Moderate spondylosis of the cervical spine.  CTA HEAD  Findings:  The postcontrast images of the head demonstrate no acute cortical infarct.  Atrophy and moderate white matter changes are again seen.  Remote subcortical white matter lacunar infarcts are noted within the left external capsule.  The  postcontrast images demonstrate no pathologic enhancement.  Circumferential mucosal thickening is present in the left maxillary sinus.  There is a small fluid level in the left sphenoid sinus. The paranasal sinuses and mastoid air cells are clear.  Dense calcifications are present within the right cavernous carotid artery there is a focal stenosis of at least 50% in the supraclinoid right internal carotid artery, just proximal to the posterior communicating artery.  Similar dense calcifications are present throughout the left cavernous carotid artery with high-grade stenosis of the anterior genu there is a moderate tandem stenosis of the distal aspect of the calcifications.   Mild irregularity is present within the proximal M1  and A1 segments bilaterally without to high-grade stenosis.  The right anterior communicating artery is patent.  The MCA bifurcations are within normal limits bilaterally.  There is significant attenuation of distal MCA branch vessels bilaterally without a significant proximal stenosis or occlusion.  The basilar artery is within normal limits.  The left P1 segment originates from the basilar tip.  The right posterior cerebral artery is of fetal type.  The distal PCA branch vessels demonstrate significant attenuation.  There is no significant proximal stenosis or occlusion.   Review of the MIP images confirms the above findings.  IMPRESSION:  1.  High-grade stenosis within the cavernous left internal carotid artery. 2.  Moderate supraclinoid internal carotid artery stenoses bilaterally. 3.  Mild to moderate irregularity of the proximal A1 and M1 segments bilaterally without significant stenosis. 4.  Moderate small vessel disease. 5.  Apart from the cavernous and supraclinoid left internal carotid artery stenoses, no other focal lesion is evident to explain the patient's symptoms.   Original Report Authenticated By: Jamesetta Orleans. MATTERN, M.D.    Mr Brain Wo Contrast  09/25/2012  *RADIOLOGY  REPORT*  Clinical Data: Code stroke.  New onset of right-sided weakness.  MRI HEAD WITHOUT CONTRAST  Technique:  Multiplanar, multiecho pulse sequences of the brain and surrounding structures were obtained according to standard protocol without intravenous contrast.  Comparison: CTA head and neck 09/25/2012.  Findings: The diffusion weighted images demonstrate a focus of restricted effusion in the posterior left frontal lobe, along the central sulcus.  There is no significant associated T2 or FLAIR hyperintensity.  Moderate generalized atrophy is present.  Mild periventricular subcortical T2 and FLAIR hyperintensities are present bilaterally.  Flow is present in the major intracranial arteries.  Multiple remote lacunar infarcts are noted within the cerebellum bilaterally, worse on the right.  The mastoid air cells are clear.  A fluid levels present in the left maxillary sinus.  Circumferential mucosal thickening is also present in the left maxillary sinus.  Remaining paranasal sinuses are clear.  IMPRESSION:  1.  Acute non hemorrhagic infarct within the posterior left frontal lobe, along the central sulcus measures less than 2 cm. 2.  Atrophy and mild diffuse white matter disease likely reflects the sequelae of chronic microvascular ischemia. 3.  Remote lacunar infarcts of the cerebellum bilaterally. 4.  Acute left maxillary sinus disease.  These results were called by telephone on 09/25/2012 at 12:40 p.m. to Dr. Ignacia Palma, who verbally acknowledged these results.   Original Report Authenticated By: Jamesetta Orleans. MATTERN, M.D.       Medications:    Infusions:    . sodium chloride 75 mL/hr at 09/25/12 1817     Scheduled Medications:    . aspirin  300 mg Rectal Q2000   Or  . aspirin  325 mg Oral Q2000  . atorvastatin  80 mg Oral q1800  . DULoxetine  30 mg Oral QHS  . fentaNYL  50 mcg Transdermal Q72H  . influenza  inactive virus vaccine  0.5 mL Intramuscular Tomorrow-1000  . insulin aspart  0-9  Units Subcutaneous TID WC  . magnesium sulfate 1 - 4 g bolus IVPB  2 g Intravenous Once  . ondansetron (ZOFRAN) IV  4 mg Intravenous Q6H  . potassium chloride  10 mEq Intravenous Q1 Hr x 2  . potassium chloride  10 mEq Intravenous Once     PRN Medications: iohexol   Assessment/ Plan:    Mr. Kelsay is an 78 yo gentleman with multiple comorbidities, including  HTN, DM, CHF, CAD, a fib, PVD, and L BKA who presents with right-sided weakness and aphasia secondary to an acute ischemic stroke.   #Ischemic stroke  Patient is still experiencing right-sided UE weakness and aphasia/dysarthria secondary to an ischemic stroke in the left MCA territory. This is confirmed by non-contrast MRI of the brain that shows acute-non hemorrhagic infarct within the posterior left frontal lobe. CTA of head and neck reveal high-grade stenosis in the cavernous left internal carotid artery Non-contrast head CT negative for hemorrhage.  Patient initially presented with RUE paralysis and complete expressive aphasia and/or dysarthria but now has 4/5 strength in the proximal RUE, 3/5 strength in the hand.  His speech deficit seems to be secondary to dysarthria rather than true expressive aphasia.  He has no trouble with comprehension. Patient is at high risk for an embolic or thrombotic stroke because he has chronic a fib and stopped his warfarin x 4 days, and he has CAD, hypertension, and DM. Neuro consulted, appreciate recs. --speech eval this morning; recommend dysphagia 3 (mechanical soft) diet; thin liquid --CHADS2 score is 6 Per neuro recs: --ASA therapy 81 mg po daily --pradaxa 150 mg po BID for secondary stroke prevention --Discuss whether patient should continue pradaxa v. re-start warfarin for anticoagulation. At this time daughter is undecided. --PT consult, OT consult, Speech consult  --PT recommends inpatient rehab screening --2D Echocardiogram today, results pending  --No need for Carotid dopplers given CTA  neck already done.  --Risk factor modification  --Telemetry monitoring  --Frequent neuro checks, q4hrs x 12 hrs  --atorvastatin 80 mg po once daily  --HgbA1c 8% --fasting lipid panel: chol 132 TG 135 HDL 31 LDL 74 VLDL 27 CHOL/HDL ratio 4.3   #Back pain  Patient c/o thoracic back pain on the left side that is worse than yesterday.  Xray spine show compression fractures at L3 and L4 of indeterminate age whose location does not correlate with his pain. Patient had two falls last week, the most recent on Thursday when he slipped on a sock in the bathroom. He denies LOC or head trauma. On exam patient is tender to palpation on the left paravertebral region at lower thoracic/upper lumbar spine. --fentanyl patch 50 mcg  #Hypertension  BPs have been 140-180s over ~100. Home regimen: amlodipine 5 mg po BID, losartan 50 mg po BID, metoprolol 75 mg po BID  --Will not treat hypertension at this time in the setting of acute ischemic stroke.   #Diabetes mellitus  --SSI with aspart while in house   #Chronic A fib  --per neuro, today started on pradaxa 150 mg BID today. --Discuss whether patient should continue pradaxa v. re-start warfarin for anticoagulation.  At this time daughter is undecided.  #FEN/GI  --per speech eval, mechanical soft diet with thin liquid --IVF NS @ 75 mL/hr. D/C once patient has adequate po intake, especially with his h/o CHF.   #PPX  --SCDs   Signed by:  Vanessa Ralphs, MS3    This is a Psychologist, occupational Note.  The care of the patient was discussed with Dr. Lavena Bullion and the assessment and plan formulated with their assistance.  Please see their attached note or addendum for official documentation of the daily encounter.

## 2012-09-26 NOTE — Evaluation (Signed)
SLP has reviewed and agrees with student's note below.  Breck Coons Dorr.Ed [employer] [phone]  09/26/2012

## 2012-09-26 NOTE — Progress Notes (Signed)
[facility]  Patient Status: Active (receiving services up to time of hospitalization)  [facility] is providing the following services: RN  If patient discharges after hours, please call [phone].   Mary Hickling 09/26/2012, 10:08 AM

## 2012-09-26 NOTE — Progress Notes (Signed)
  Echocardiogram 2D Echocardiogram has been performed.  Aaron [name] 09/26/2012, 10:39 AM

## 2012-09-26 NOTE — H&P (Signed)
Internal Medicine teaching Service Attending Dr.Ida Milbrath. I have personally examined the patient and reviewed the h and P documented by the Resident. In brief  Chief complaint: dysarthria right sided paresis HOPI: with a complicated past medical history in setting of being off aspirin and off warfarin in past few days. 9 point review of system as documented in the Resident note. Social history admitting medication family history past surgical history allergies reviewed. Physical examination Notable for: irregularly irregular heart rate. Right sided UL weakness improved. Dysarthria  Labs are significant for : increased glucose and low albumin Imaging is significant for: high grade stenosis of cavernous left internal carotid artery. Acute left hemorrhagic infarct within the posterior left frontal lobe. EKG: a fib A and P: Neuro : TPA not administered at the time of admission. Unsure of the reason. PT OT consult  Risk modification. anti platelet therapy statins. Cardiac: pending echo A fib . Pending neuro recommendation to start warfarin. Restart home anti HTN s as tolerated. Rest per resident documentation

## 2012-09-26 NOTE — Evaluation (Signed)
Occupational Therapy Evaluation Patient Details Name: Aaron [name] MRN: [id number] DOB: 10-19-1924 Today's Date: 09/26/2012 Time: 6295-2841 OT Time Calculation (min): 33 min  OT Assessment / Plan / Recommendation Clinical Impression  Pt admitted with aphasia and RUE weakness. Recent history of L LE BKA.  Will benefit from acute OT services to address below problem list. Recommend CIR to further progress rehab before eventual d/c home.    OT Assessment  Patient needs continued OT Services    Follow Up Recommendations  Inpatient Rehab    Barriers to Discharge      Equipment Recommendations  None recommended by OT    Recommendations for Other Services Rehab consult  Frequency  Min 3X/week    Precautions / Restrictions Precautions Precautions: Fall Restrictions Other Position/Activity Restrictions: LLE BKA   Pertinent Vitals/Pain See vitals    ADL  Grooming: Performed;Brushing hair;Minimal assistance (for thoroughness) Where Assessed - Grooming: Supported sitting Upper Body Bathing: Simulated;Moderate assistance Where Assessed - Upper Body Bathing: Supported sitting Lower Body Bathing: Simulated;Moderate assistance (+2 for standing) Where Assessed - Lower Body Bathing: Supported sit to stand Upper Body Dressing: Simulated;Moderate assistance Where Assessed - Upper Body Dressing: Supported sitting Lower Body Dressing: Simulated;Moderate assistance (+2 for standing) Where Assessed - Lower Body Dressing: Supported sit to stand Toilet Transfer: Simulated;+2 Total assistance Toilet Transfer: Patient Percentage: 70% Statistician Method: Ambulance person:  (bed to recliner) Equipment Used: Gait belt Transfers/Ambulation Related to ADLs: +2 assist for squat pivot to right side from bed to recliner. ADL Comments: Limited by fatigue and nausea during ADL assessment    OT Diagnosis: Generalized weakness;Paresis  OT Problem List: Decreased  strength;Decreased activity tolerance;Impaired balance (sitting and/or standing);Decreased coordination;Impaired UE functional use;Impaired sensation OT Treatment Interventions: Self-care/ADL training;Therapeutic exercise;Neuromuscular education;Therapeutic activities;Patient/family education;Balance training   OT Goals Acute Rehab OT Goals OT Goal Formulation: With patient Time For Goal Achievement: 10/10/12 Potential to Achieve Goals: Good ADL Goals Pt Will Perform Grooming: Unsupported;Sitting, chair;Sitting, edge of bed;with supervision ADL Goal: Grooming - Progress: Goal set today Pt Will Perform Upper Body Dressing: with supervision;Sitting, chair;Sitting, bed;Unsupported ADL Goal: Upper Body Dressing - Progress: Goal set today Pt Will Perform Lower Body Dressing: with supervision;Sit to stand from chair;Sit to stand from bed;Supported ADL Goal: Lower Body Dressing - Progress: Goal set today Pt Will Transfer to Toilet: with mod assist;Stand pivot transfer;with DME;3-in-1 ADL Goal: Toilet Transfer - Progress: Goal set today Arm Goals Additional Arm Goal #1: Pt will independently perform RUE AAROM/AROM HEP to increase ROM and strength. Arm Goal: Additional Goal #1 - Progress: Goal set today Miscellaneous OT Goals Miscellaneous OT Goal #1: Pt will perform bed mobility with mod I in prep for EOB ADLs. OT Goal: Miscellaneous Goal #1 - Progress: Goal set today Miscellaneous OT Goal #2: Pt will perform dynamic sitting balance task with supervision >5 min in prep for EOB ADLs. OT Goal: Miscellaneous Goal #2 - Progress: Goal set today  Visit Information  Last OT Received On: 09/26/12 Assistance Needed: +2 PT/OT Co-Evaluation/Treatment: Yes    Subjective Data      Prior Functioning     Home Living Lives With: Alone Available Help at Discharge: Personal care attendant;Available 24 hours/day Type of Home: House Home Access: Ramped entrance Home Layout: One level Bathroom  Shower/Tub: Engineer, manufacturing systems: Standard Home Adaptive Equipment: Walker - rolling;Bedside commode/3-in-1;Tub transfer bench;Wheelchair - manual Additional Comments: Pt had L BKA  in August 2013 per daugther report and [facility]  about 10 days ago. Prior Function Level of Independence: Needs assistance Needs Assistance: Bathing;Dressing;Transfers Bath: Minimal Dressing: Minimal Gait Assistance: Daughter reports he was using RW to "bunny hop" after his amputation but with caregiver present Transfer Assistance: superivsion from personal care attendant Able to Take Stairs?: No Driving: No Vocation: Retired Musician: Expressive difficulties Dominant Hand: Right         Vision/Perception     Cognition  Overall Cognitive Status: Appears within functional limits for tasks assessed/performed Arousal/Alertness: Awake/alert Orientation Level: Appears intact for tasks assessed Behavior During Session: [facility] for tasks performed    Extremity/Trunk Assessment Right Upper Extremity Assessment RUE ROM/Strength/Tone: Deficits RUE ROM/Strength/Tone Deficits: Grip strength 2+/5. Wrist and elbow 3-/5. Shoulder 2+/5. RUE Sensation: Deficits RUE Sensation Deficits: "a little numb" RUE Coordination: Deficits RUE Coordination Deficits: both FM and GM deficits Left Upper Extremity Assessment LUE ROM/Strength/Tone: WFL for tasks assessed LUE Sensation: WFL - Light Touch;WFL - Proprioception LUE Coordination: WFL - gross/fine motor Right Lower Extremity Assessment RLE ROM/Strength/Tone: Deficits RLE ROM/Strength/Tone Deficits: generally weak, grossly 4/5 RLE Sensation: WFL - Light Touch Left Lower Extremity Assessment LLE ROM/Strength/Tone: WFL for tasks assessed;Deficits LLE ROM/Strength/Tone Deficits: pt with recent BKA LLE Sensation: WFL - Light Touch Trunk Assessment Trunk Assessment: Kyphotic     Mobility Bed Mobility Bed Mobility: Supine  to Sit Supine to Sit: 4: Min assist Details for Bed Mobility Assistance: increased effort secondary to RUE weakness, sequencing cues as well as minA for follow through and ease of movement Transfers Sit to Stand: 1: +2 Total assist;With upper extremity assist;From bed Sit to Stand: Patient Percentage: 50% Stand to Sit: 1: +2 Total assist;With upper extremity assist;To bed Stand to Sit: Patient Percentage: 60% Details for Transfer Assistance: cues for technique, bilateral support for stability, pt stood with posterior lean and unable to bring trunk securely over his RLE so pt sat with assist; performed squat pivot well by reaching for the arm rests on the chair and pivoting with facilitation from therapists for anteior translation of trunk over BOS so as to maintain tunk clearance over bed rails and arm rest as well as to guide hips to chair     Shoulder Instructions     Exercise     Balance Balance Balance Assessed: Yes Static Sitting Balance Static Sitting - Balance Support: Bilateral upper extremity supported Static Sitting - Level of Assistance: 5: Stand by assistance;4: Min assist Static Sitting - Comment/# of Minutes: at times become fatigued and falls posteriorly but able to correct by reaching for bed rail with LUE  Dynamic Sitting Balance Dynamic Sitting - Balance Support: Bilateral upper extremity supported Dynamic Sitting - Level of Assistance: 4: Min assist Dynamic Sitting - Comments: falls posteriorly with MMT of lower extremities   End of Session OT - End of Session Equipment Utilized During Treatment: Gait belt Activity Tolerance: Patient limited by fatigue (limited by nausea) Patient left: in chair;with call bell/phone within reach;with family/visitor present Nurse Communication: Mobility status  GO    09/26/2012 Cipriano Mile OTR/L Pager [phone] Office [phone]  Cipriano Mile 09/26/2012, 4:50 PM

## 2012-09-26 NOTE — Progress Notes (Signed)
Resident Addendum to Medical Student Note   I have seen and examined the patient, and agree with the the medical student assessment and plan outlined above. Please see my brief note below for additional details.  S: Dysarthria persists this AM, limiting patient's ability to communicate verbally. He does not appear to have any receptive or cortical expressive deficits suggestive of aphasia at this time. He is able to relate that he is having back pain and nausea this AM, but has no other new or worsening complaints. [name] is present in room, and does not think that patient's stroke symptoms have improved overnight (although dysarthria seems improved from treatment team perspective).   OBJECTIVE: VS: Reviewed  Meds: Reviewed  Labs: Reviewed  Imaging: Reviewed   Physical Exam:  General:  Disheveled.   HEENT:  Normocephalic, atraumatic   Lungs:  Normal respiratory effort. Clear to auscultation BL without crackles or wheezes.   Heart:  Irregularly irregular. S1 and S2 normal. S3 present.   Abdomen:   Neuro:  BS normoactive. Soft, Nondistended, non-tender. No masses or organomegaly.  Persistent R sided facial weakness. CN II-XII otherwise intact. LUE, LLE, RLE all 5/5. RUE 3/5, with R hand 2/5 (interval worsening). Cerebellar testing on L wnl (unable to be performed on R 2/2 RUE weakness)  Extremities:  MSK:  No pretibial edema. L BKA.  TTP of L paraspinal musculature at ~T8-T10 (more tenderness at T10-L2 on previous exam)      ASSESSMENT/ PLAN: Aaron [name] is an 78 yo gentleman with multiple comorbidities, including HTN, DM, CHF, CAD, a fib, PVD, and L BKA who presents with right-sided weakness and aphasia secondary to an acute ischemic stroke.   Ischemic stroke - MRI revealed posterior L frontal lobe ischemic infarct. RUE weakness persists and is slightly worsened since previous exam. Dysarthria slightly improved. No other new deficits. No receptive/expressive deficits. Seen  by PT this afternoon, with recommendations for evaluation for inpatient rehab following discharge. SLP has seen patient, and are recommending mechanical soft diet.  - 2d echo results pending - cont lipitor 80mg   - cont daily ASA - neuro following and providing recs - await OT recommendations   Back pain - Patient continuing to have pain and TTP of L paraspinal musculature at T8-T10 this AM. XR revealed compression fx L3, L4, not consistent with patient's complaints of pain. As pain is paraspinal and patient does not have TTP of vertebrae, it is likely 2/2 MSK strain (possibly related to recent fall).  - continue to monitor  - fentanyl patch  Hypertension - continuing to hold anti-HTN in setting of acute ischemic stroke.  Diabetes mellitus - A1c = 8.0. Good glycemic control on SSI.  Chronic A fib - patient has history of sub- and supra-therapeutic INR on warfarin therapy, which may have possibly precipitated his acute ischemic stroke. Neurology recommending xarelto for anticoagulation going forward.  - start pradaxa 150 mg BID   DVT PPX - xarelto    Length of Stay: 1   Aaron [name], Aaron [name] PGY2, Internal Medicine Resident 09/26/2012, 2:53 PM

## 2012-09-27 ENCOUNTER — Inpatient Hospital Stay (HOSPITAL_COMMUNITY): Payer: Medicare Other

## 2012-09-27 DIAGNOSIS — M549 Dorsalgia, unspecified: Secondary | ICD-10-CM | POA: Diagnosis present

## 2012-09-27 LAB — CBC
HCT: 34.3 % — ABNORMAL LOW (ref 39.0–52.0)
Hemoglobin: 10.7 g/dL — ABNORMAL LOW (ref 13.0–17.0)
MCV: 90.3 fL (ref 78.0–100.0)
WBC: 22.4 10*3/uL — ABNORMAL HIGH (ref 4.0–10.5)

## 2012-09-27 LAB — GLUCOSE, CAPILLARY
Glucose-Capillary: 517 mg/dL — ABNORMAL HIGH (ref 70–99)
Glucose-Capillary: 497 mg/dL — ABNORMAL HIGH (ref 70–99)
Glucose-Capillary: 395 mg/dL — ABNORMAL HIGH (ref 70–99)

## 2012-09-27 LAB — URINE MICROSCOPIC-ADD ON

## 2012-09-27 LAB — BASIC METABOLIC PANEL
BUN: 44 mg/dL — ABNORMAL HIGH (ref 6–23)
Calcium: 9.3 mg/dL (ref 8.4–10.5)
Chloride: 102 mEq/L (ref 96–112)
Chloride: 102 mEq/L (ref 96–112)
Creatinine, Ser: 2.44 mg/dL — ABNORMAL HIGH (ref 0.50–1.35)
GFR calc Af Amer: 26 mL/min — ABNORMAL LOW (ref >90)
GFR calc non Af Amer: 22 mL/min — ABNORMAL LOW (ref >90)
GFR calc non Af Amer: 20 mL/min — ABNORMAL LOW (ref >90)
Glucose, Bld: 256 mg/dL — ABNORMAL HIGH (ref 70–99)
Glucose, Bld: 167 mg/dL — ABNORMAL HIGH (ref 70–99)
Potassium: 4.8 mEq/L (ref 3.5–5.1)
Potassium: 4.6 mEq/L (ref 3.5–5.1)
Sodium: 139 mEq/L (ref 135–145)

## 2012-09-27 LAB — URINALYSIS, ROUTINE W REFLEX MICROSCOPIC
Glucose, UA: 100 mg/dL — AB
Hgb urine dipstick: NEGATIVE
Specific Gravity, Urine: 1.02 (ref 1.005–1.030)
pH: 5 (ref 5.0–8.0)

## 2012-09-27 MED ORDER — SODIUM CHLORIDE 0.9 % IV BOLUS (SEPSIS)
500.0000 mL | Freq: Once | INTRAVENOUS | Status: AC
  Administered 2012-09-27: 500 mL via INTRAVENOUS

## 2012-09-27 MED ORDER — INSULIN GLARGINE 100 UNIT/ML ~~LOC~~ SOLN
5.0000 [IU] | Freq: Once | SUBCUTANEOUS | Status: AC
  Administered 2012-09-27: 5 [IU] via SUBCUTANEOUS

## 2012-09-27 MED ORDER — INSULIN ASPART 100 UNIT/ML ~~LOC~~ SOLN
10.0000 [IU] | Freq: Once | SUBCUTANEOUS | Status: AC
  Administered 2012-09-27: 10 [IU] via SUBCUTANEOUS

## 2012-09-27 MED ORDER — ASPIRIN 300 MG RE SUPP
150.0000 mg | Freq: Every day | RECTAL | Status: DC
  Administered 2012-09-27: 150 mg via RECTAL
  Filled 2012-09-27 (×2): qty 1

## 2012-09-27 MED ORDER — CLINDAMYCIN PHOSPHATE 600 MG/50ML IV SOLN
600.0000 mg | Freq: Three times a day (TID) | INTRAVENOUS | Status: DC
  Administered 2012-09-27 – 2012-09-28 (×3): 600 mg via INTRAVENOUS
  Filled 2012-09-27 (×5): qty 50

## 2012-09-27 MED ORDER — INSULIN GLARGINE 100 UNIT/ML ~~LOC~~ SOLN
15.0000 [IU] | Freq: Every day | SUBCUTANEOUS | Status: DC
  Administered 2012-09-27: 15 [IU] via SUBCUTANEOUS

## 2012-09-27 MED ORDER — SODIUM CHLORIDE 0.9 % IV BOLUS (SEPSIS): 500.0000 mL | Freq: Once | INTRAVENOUS | Status: DC

## 2012-09-27 MED ORDER — INSULIN ASPART 100 UNIT/ML ~~LOC~~ SOLN
0.0000 [IU] | Freq: Three times a day (TID) | SUBCUTANEOUS | Status: DC
  Administered 2012-09-27: 7 [IU] via SUBCUTANEOUS
  Administered 2012-09-28: 11 [IU] via SUBCUTANEOUS

## 2012-09-27 MED ORDER — INSULIN ASPART 100 UNIT/ML ~~LOC~~ SOLN: 0.0000 [IU] | Freq: Three times a day (TID) | SUBCUTANEOUS | Status: DC

## 2012-09-27 MED ORDER — INSULIN GLARGINE 100 UNIT/ML ~~LOC~~ SOLN: 5.0000 [IU] | Freq: Every day | SUBCUTANEOUS | Status: DC

## 2012-09-27 MED ORDER — VANCOMYCIN HCL IN DEXTROSE 1-5 GM/200ML-% IV SOLN
1000.0000 mg | INTRAVENOUS | Status: DC
  Administered 2012-09-27: 1000 mg via INTRAVENOUS
  Filled 2012-09-27: qty 200

## 2012-09-27 MED ORDER — PANTOPRAZOLE SODIUM 40 MG IV SOLR
40.0000 mg | Freq: Every day | INTRAVENOUS | Status: DC
  Administered 2012-09-27 – 2012-09-28 (×2): 40 mg via INTRAVENOUS
  Filled 2012-09-27 (×3): qty 40

## 2012-09-27 NOTE — Progress Notes (Signed)
Subjective:  Patient was evaluated in the morning and was found to have more altered mental status and worsening right sided weakness. Furthermore patients blood sugars were elevated in the 400-500 range.    Objective: Vital signs in last 24 hours: Filed Vitals:   09/26/12 1800 09/26/12 2200 09/27/12 0200 09/27/12 0600  BP: 118/62 139/60 135/61 136/67  Pulse: 103 103 113 108  Temp: 98.2 F (36.8 C) 98 F (36.7 C) 97.9 F (36.6 C) 97.8 F (36.6 C)  TempSrc: Oral Oral Oral Oral  Resp: 18 18 18 18   Height:      Weight:      SpO2: 97% 92% 95% 96%   Weight change:   Intake/Output Summary (Last 24 hours) at 09/27/12 0736 Last data filed at 09/27/12 0100  Gross per 24 hour  Intake      0 ml  Output     50 ml  Net    -50 ml   Physical Exam: Constitutional: Vital signs reviewed.  Patient is a well-developed and well-nourished  in no acute distress and cooperative with exam. Alert and oriented x2.  Neck: Supple,  Cardiovascular: RRR, S1 normal, S2 normal, Pulmonary/Chest: CTAB, no wheezes, rales, or rhonchi Abdominal: Soft. Non-tender, non-distended, bowel sounds are normal,  Neurological: A&O x3, Right-sided mouth/lower facial droppiness. Dysarthria continues to be present, CN II-XII otherwise intact bilaterally. RUE 2/5 strength and cannot skeeze right hand.  Not able to hold right arm against gravity.  LUE 5/5 strength. RLE and LLE 5/5 strength. Sensation normal and equal bilaterally.  Lab Results: Basic Metabolic Panel:  Lab 09/26/12 7829 09/25/12 1122 09/25/12 0945  NA 135 137 --  K 4.4 3.8 --  CL 98 100 --  CO2 22 -- 26  GLUCOSE 193* 177* --  BUN 15 20 --  CREATININE 0.91 1.30 --  CALCIUM 9.1 -- 9.1  MG 2.2 -- --  PHOS -- -- --   Liver Function Tests:  Lab 09/25/12 0945  AST 21  ALT 10  ALKPHOS 96  BILITOT 0.4  PROT 7.2  ALBUMIN 2.9*     CBC:  Lab 09/25/12 1122 09/25/12 1001 09/25/12 0945  WBC -- -- 11.7*  NEUTROABS -- -- 8.4*  HGB 13.9 13.6 --  HCT  41.0 40.0 --  MCV -- -- 89.4  PLT -- -- 360   Cardiac Enzymes:  Lab 09/25/12 0945  CKTOTAL --  CKMB --  CKMBINDEX --  TROPONINI <0.30    CBG:  Lab 09/27/12 0637 09/26/12 2112 09/26/12 1625 09/26/12 1208 09/26/12 0815 09/26/12 0403  GLUCAP 476* 256* 344* 291* 202* 164*   Hemoglobin A1C:  Lab 09/25/12 1403  HGBA1C 8.0*   Fasting Lipid Panel:  Lab 09/26/12 0625  CHOL 132  HDL 31*  LDLCALC 74  TRIG 562  CHOLHDL 4.3  LDLDIRECT --    Coagulation:  Lab 09/25/12 0930  LABPROT 16.4*  INR 1.35     Studies/Results: Ct Angio Head W/cm &/or Wo Cm  09/25/2012  *RADIOLOGY REPORT*  Clinical Data:  Stroke.  Right-sided hemiparesis and unable to speak.  Last seen normal at 5 o'clock a.m.  CT ANGIOGRAPHY HEAD AND NECK  Technique:  Multidetector CT imaging of the head and neck was performed using the standard protocol during bolus administration of intravenous contrast.  Multiplanar CT image reconstructions including MIPs were obtained to evaluate the vascular anatomy. Carotid stenosis measurements (when applicable) are obtained utilizing NASCET criteria, using the distal internal carotid diameter as the denominator.  Contrast: 50mL  OMNIPAQUE IOHEXOL 350 MG/ML SOLN  Comparison:  CT head without contrast 09/25/2012.  CTA NECK  Findings:  A standard three-vessel arch configuration is present. The vertebral arteries both originate from the subclavian arteries. There is dense calcification at the origin of the nondominant right vertebral artery, suggesting high-grade stenosis.  There are tandem stenoses and C6-7 on the right and again at C5-6 on the right. There is high-grade stenosis at the dural margin of the right vertebral artery without significant contribution to the vertebral basilar junction.  Calcification at the origin of the dominant left vertebral artery is associated with mild stenosis of less than 50%.  There is some tortuosity in the remaining left vertebral artery without  significant stenosis through the vertebral basilar junction.  The right to common carotid arteries within normal limits.  Dense atherosclerotic calcifications are as proximal right internal carotid artery without significant stenosis relative to the distal vessel.  The cervical ICA is tortuous without significant stenosis.  The left common carotid artery is within normal limits.  Dense calcifications are present at the origin of the proximal left internal carotid artery.  The minimal luminal diameters 2.4 mm. Compared with a distal measurement of 4.9 mm, there is no significant stenosis.  Moderate tortuosity is present in the more distal left internal carotid artery without significant stenosis to the skull base.  The soft tissues of the neck demonstrate no focal mucosal or submucosal lesions.  No significant adenopathy is present.  Bone windows demonstrate moderate endplate degenerative changes at C3-4, C5-6, and C6-7.   Review of the MIP images confirms the above findings.  IMPRESSION:  1.  High-grade stenosis of the right vertebral artery at its origin with tandem stenoses at C6-7, C5-6, and at the dural margin.  The distal vertebral artery is essentially occluded. 2.  Mild narrowing of the proximal left vertebral artery, dominant vessel without other significant stenoses through the neck. 3.  50% stenosis of the proximal left internal carotid artery. 4.  Atherosclerotic calcifications of the proximal right internal carotid artery without significant stenosis. 5.  Tortuosity of the internal carotid arteries bilaterally as well as the left vertebral artery, likely secondary to hypertension. 6.  Moderate spondylosis of the cervical spine.  CTA HEAD  Findings:  The postcontrast images of the head demonstrate no acute cortical infarct.  Atrophy and moderate white matter changes are again seen.  Remote subcortical white matter lacunar infarcts are noted within the left external capsule.  The postcontrast images  demonstrate no pathologic enhancement.  Circumferential mucosal thickening is present in the left maxillary sinus.  There is a small fluid level in the left sphenoid sinus. The paranasal sinuses and mastoid air cells are clear.  Dense calcifications are present within the right cavernous carotid artery there is a focal stenosis of at least 50% in the supraclinoid right internal carotid artery, just proximal to the posterior communicating artery.  Similar dense calcifications are present throughout the left cavernous carotid artery with high-grade stenosis of the anterior genu there is a moderate tandem stenosis of the distal aspect of the calcifications.   Mild irregularity is present within the proximal M1 and A1 segments bilaterally without to high-grade stenosis.  The right anterior communicating artery is patent.  The MCA bifurcations are within normal limits bilaterally.  There is significant attenuation of distal MCA branch vessels bilaterally without a significant proximal stenosis or occlusion.  The basilar artery is within normal limits.  The left P1 segment originates from the basilar  tip.  The right posterior cerebral artery is of fetal type.  The distal PCA branch vessels demonstrate significant attenuation.  There is no significant proximal stenosis or occlusion.   Review of the MIP images confirms the above findings.  IMPRESSION:  1.  High-grade stenosis within the cavernous left internal carotid artery. 2.  Moderate supraclinoid internal carotid artery stenoses bilaterally. 3.  Mild to moderate irregularity of the proximal A1 and M1 segments bilaterally without significant stenosis. 4.  Moderate small vessel disease. 5.  Apart from the cavernous and supraclinoid left internal carotid artery stenoses, no other focal lesion is evident to explain the patient's symptoms.   Original Report Authenticated By: Jamesetta Orleans. MATTERN, M.D.    Dg Lumbar Spine Complete  09/25/2012  *RADIOLOGY REPORT*   Clinical Data: Back pain.  Recent fall.  LUMBAR SPINE - COMPLETE 4+ VIEW  Comparison: None  Findings: Prior cement vertebral augmentation of T11, T12, and L1 for  fracture treatment.  Moderate fracture of L3 is indeterminate in age.  Mild superior endplate fracture L4 of indeterminate age.  If the patient has acute pain in this area, consider MRI for further evaluation. Normal lumbar alignment.  Mild facet degeneration at L5-S1.  No pars defect.  2 cm right renal calculus compatible with a partial staghorn calculus  IMPRESSION: Cement vertebral augmentation at T11, T12, and L1.  Compression fractures of L3 and L4 of indeterminate age.  2 cm partial staghorn calculus in the right renal pelvis.   Original Report Authenticated By: Camelia Phenes, M.D.    Ct Head Wo Contrast  09/25/2012  *RADIOLOGY REPORT*  Clinical Data: Sudden onset of right-sided weakness and facial droop.  CT HEAD WITHOUT CONTRAST  Technique:  Contiguous axial images were obtained from the base of the skull through the vertex without contrast.  Comparison: None.  Findings: No intracranial hemorrhage.  Small vessel disease type changes.  Remote right occipital lobe and right cerebellar infarcts suspected without CT evidence of large acute infarct.  Global atrophy without hydrocephalus.  No intracranial mass lesion detected on this unenhanced exam.  Prominent vascular calcifications.  Moderate mucosal thickening left maxillary sinus.  IMPRESSION: No intracranial hemorrhage.  Small vessel disease type changes.  Remote right occipital lobe and right cerebellar infarcts suspected without CT evidence of large acute infarct.  Critical Value/emergent results were called by telephone at the time of interpretation on 09/25/2012 at 9:40 a.m. to Dr. Amada Jupiter, who verbally acknowledged these results.   Original Report Authenticated By: Fuller Canada, M.D.    Ct Angio Neck W/cm &/or Wo/cm  09/25/2012  *RADIOLOGY REPORT*  Clinical Data:  Stroke.   Right-sided hemiparesis and unable to speak.  Last seen normal at 5 o'clock a.m.  CT ANGIOGRAPHY HEAD AND NECK  Technique:  Multidetector CT imaging of the head and neck was performed using the standard protocol during bolus administration of intravenous contrast.  Multiplanar CT image reconstructions including MIPs were obtained to evaluate the vascular anatomy. Carotid stenosis measurements (when applicable) are obtained utilizing NASCET criteria, using the distal internal carotid diameter as the denominator.  Contrast: 50mL OMNIPAQUE IOHEXOL 350 MG/ML SOLN  Comparison:  CT head without contrast 09/25/2012.  CTA NECK  Findings:  A standard three-vessel arch configuration is present. The vertebral arteries both originate from the subclavian arteries. There is dense calcification at the origin of the nondominant right vertebral artery, suggesting high-grade stenosis.  There are tandem stenoses and C6-7 on the right and again at C5-6 on the  right. There is high-grade stenosis at the dural margin of the right vertebral artery without significant contribution to the vertebral basilar junction.  Calcification at the origin of the dominant left vertebral artery is associated with mild stenosis of less than 50%.  There is some tortuosity in the remaining left vertebral artery without significant stenosis through the vertebral basilar junction.  The right to common carotid arteries within normal limits.  Dense atherosclerotic calcifications are as proximal right internal carotid artery without significant stenosis relative to the distal vessel.  The cervical ICA is tortuous without significant stenosis.  The left common carotid artery is within normal limits.  Dense calcifications are present at the origin of the proximal left internal carotid artery.  The minimal luminal diameters 2.4 mm. Compared with a distal measurement of 4.9 mm, there is no significant stenosis.  Moderate tortuosity is present in the more distal left  internal carotid artery without significant stenosis to the skull base.  The soft tissues of the neck demonstrate no focal mucosal or submucosal lesions.  No significant adenopathy is present.  Bone windows demonstrate moderate endplate degenerative changes at C3-4, C5-6, and C6-7.   Review of the MIP images confirms the above findings.  IMPRESSION:  1.  High-grade stenosis of the right vertebral artery at its origin with tandem stenoses at C6-7, C5-6, and at the dural margin.  The distal vertebral artery is essentially occluded. 2.  Mild narrowing of the proximal left vertebral artery, dominant vessel without other significant stenoses through the neck. 3.  50% stenosis of the proximal left internal carotid artery. 4.  Atherosclerotic calcifications of the proximal right internal carotid artery without significant stenosis. 5.  Tortuosity of the internal carotid arteries bilaterally as well as the left vertebral artery, likely secondary to hypertension. 6.  Moderate spondylosis of the cervical spine.  CTA HEAD  Findings:  The postcontrast images of the head demonstrate no acute cortical infarct.  Atrophy and moderate white matter changes are again seen.  Remote subcortical white matter lacunar infarcts are noted within the left external capsule.  The postcontrast images demonstrate no pathologic enhancement.  Circumferential mucosal thickening is present in the left maxillary sinus.  There is a small fluid level in the left sphenoid sinus. The paranasal sinuses and mastoid air cells are clear.  Dense calcifications are present within the right cavernous carotid artery there is a focal stenosis of at least 50% in the supraclinoid right internal carotid artery, just proximal to the posterior communicating artery.  Similar dense calcifications are present throughout the left cavernous carotid artery with high-grade stenosis of the anterior genu there is a moderate tandem stenosis of the distal aspect of the  calcifications.   Mild irregularity is present within the proximal M1 and A1 segments bilaterally without to high-grade stenosis.  The right anterior communicating artery is patent.  The MCA bifurcations are within normal limits bilaterally.  There is significant attenuation of distal MCA branch vessels bilaterally without a significant proximal stenosis or occlusion.  The basilar artery is within normal limits.  The left P1 segment originates from the basilar tip.  The right posterior cerebral artery is of fetal type.  The distal PCA branch vessels demonstrate significant attenuation.  There is no significant proximal stenosis or occlusion.   Review of the MIP images confirms the above findings.  IMPRESSION:  1.  High-grade stenosis within the cavernous left internal carotid artery. 2.  Moderate supraclinoid internal carotid artery stenoses bilaterally. 3.  Mild to  moderate irregularity of the proximal A1 and M1 segments bilaterally without significant stenosis. 4.  Moderate small vessel disease. 5.  Apart from the cavernous and supraclinoid left internal carotid artery stenoses, no other focal lesion is evident to explain the patient's symptoms.   Original Report Authenticated By: Jamesetta Orleans. MATTERN, M.D.    Mr Brain Wo Contrast  09/25/2012  *RADIOLOGY REPORT*  Clinical Data: Code stroke.  New onset of right-sided weakness.  MRI HEAD WITHOUT CONTRAST  Technique:  Multiplanar, multiecho pulse sequences of the brain and surrounding structures were obtained according to standard protocol without intravenous contrast.  Comparison: CTA head and neck 09/25/2012.  Findings: The diffusion weighted images demonstrate a focus of restricted effusion in the posterior left frontal lobe, along the central sulcus.  There is no significant associated T2 or FLAIR hyperintensity.  Moderate generalized atrophy is present.  Mild periventricular subcortical T2 and FLAIR hyperintensities are present bilaterally.  Flow is present  in the major intracranial arteries.  Multiple remote lacunar infarcts are noted within the cerebellum bilaterally, worse on the right.  The mastoid air cells are clear.  A fluid levels present in the left maxillary sinus.  Circumferential mucosal thickening is also present in the left maxillary sinus.  Remaining paranasal sinuses are clear.  IMPRESSION:  1.  Acute non hemorrhagic infarct within the posterior left frontal lobe, along the central sulcus measures less than 2 cm. 2.  Atrophy and mild diffuse white matter disease likely reflects the sequelae of chronic microvascular ischemia. 3.  Remote lacunar infarcts of the cerebellum bilaterally. 4.  Acute left maxillary sinus disease.  These results were called by telephone on 09/25/2012 at 12:40 p.m. to Dr. Ignacia Palma, who verbally acknowledged these results.   Original Report Authenticated By: Jamesetta Orleans. MATTERN, M.D.    Medications: I have reviewed the patient's current medications. Scheduled Meds:   . aspirin  81 mg Oral Once  . atorvastatin  80 mg Oral q1800  . dabigatran  150 mg Oral Q12H  . DULoxetine  30 mg Oral QHS  . fentaNYL  50 mcg Transdermal Q72H  . influenza  inactive virus vaccine  0.5 mL Intramuscular Tomorrow-1000  . insulin aspart  0-9 Units Subcutaneous TID WC  . insulin aspart  10 Units Subcutaneous Once  . pantoprazole  40 mg Oral Q1200  . DISCONTD: aspirin  300 mg Rectal Q2000  . DISCONTD: aspirin  325 mg Oral Q2000  . DISCONTD: ondansetron (ZOFRAN) IV  4 mg Intravenous Q6H   Continuous Infusions:   . sodium chloride 75 mL/hr at 09/25/12 1817   PRN Meds:.acetaminophen, bisacodyl, ketorolac, ondansetron (ZOFRAN) IV  2 D echo:  Study Conclusions  - Left ventricle: Wall thickness was increased in a pattern of mild LVH. Systolic function was vigorous. The estimated ejection fraction was in the range of 65% to 70%. - Aortic valve: A bioprosthesis was present. - Mitral valve: Calcified annulus. Mild regurgitation. -  Left atrium: The atrium was moderately dilated.   Assessment/Plan:  Aaron [name] is an 78 yo gentleman with multiple comorbidities, including HTN, DM, CHF, CAD, a fib, PVD, and L BKA who presents with right-sided weakness and aphasia secondary to an acute ischemic stroke.   Ischemic stroke - MRI revealed posterior L frontal lobe ischemic infarct. RUE weakness persists and is slightly worsened since previous exam. Dysarthria slightly improved. No other new deficits. No receptive/expressive deficits. Seen by PT this afternoon, with recommendations for evaluation for inpatient rehab following discharge. SLP has seen patient,  and are recommending mechanical soft diet on 09/26/12.  After episode of mental status changes and worsening neuro exam ( weakness of right arm  And hand) a stat CT was obtained which showed posterior left frontal lobe acute infarct appears larger than on the prior MR. There may have been interval extension of this infarct (as versus result of edema from expected evolution of the acute infarct causing the CT appearance). No associated hemorrhage. Patient was placed NPO and bed rest with no PT for today. Neurological changes likely associated with progression of stroke. Furthermore patient  Considering CBG between 400-500 which  Could be another cause of confusion stage.  2 D echo: EF of 65 % , Left atrium: atrium was moderately dilated.  - NPO - bed rest - Place Head flat - Neuro check q2 hours and Vitals q2hours  - cont lipitor 80mg   - cont daily ASA 150 mg rectally  - Appreciate Dr Marlis Edelson assistance  - DM control see below   Diabetes mellitus - A1c = 8.0. CBG between 400-500 - Will start Lantus 20 units ( home dose is 28) - Will change SSI to resistance   Back pain - Patient continuing to have pain and TTP of L paraspinal musculature at T8-T10 this AM. XR revealed compression fx L3, L4, not consistent with patient's complaints of pain. As pain is paraspinal and patient does  not have TTP of vertebrae, it is likely 2/2 MSK strain (possibly related to recent fall).  - continue to monitor  - d/c  fentanyl patch in the setting of confusion   Hypertension - continuing to hold anti-HTN in setting of acute ischemic stroke.   Chronic A fib - patient has history of sub- and supra-therapeutic INR on warfarin therapy, which may have possibly precipitated his acute ischemic stroke. Neurology recommending xarelto for anticoagulation going forward.  - Holding pradaxa 150 mg BID po since patient is NPO   DVT PPX - SCD     LOS: 2 days   Aaron [name] 09/27/2012, 7:36 AM

## 2012-09-27 NOTE — Progress Notes (Signed)
Speech Language Pathology Dysphagia Treatment Patient Details Name: Aaron [name] MRN: [id number] DOB: [birth year] Today's Date: 09/27/2012 Time: 0305-0319 SLP Time Calculation (min): 14 min  Assessment / Plan / Recommendation Clinical Impression  Pt. seen for diagnostic treatment of swallow safety per MD request as Pt.'s stroke has extended. He is lethargic but cooperative at bedside. Pharyngeal phase characterized by delayed throat clear/cough indicative of possible decreased airway protection and suspected delayed swallow initiation with thin liquids. Overall, Pt. presents with increased dysphagia symptoms today. Given increased lethargy today, SLP recommends Dys 2 diet and nectar thick liquids as s/s of aspiration decreased with this consistency. MBS is recommended to objectively evaluate swallow safety.      Diet Recommendation  Initiate / Change Diet: Dysphagia 2 (fine chop);Nectar-thick liquid    SLP Plan MBS      Swallowing Goals  SLP Swallowing Goals Patient will consume recommended diet without observed clinical signs of aspiration with: Minimal cueing Swallow Study Goal #1 - Progress: Progressing toward goal Patient will utilize recommended strategies during swallow to increase swallowing safety with: Minimal cueing Swallow Study Goal #2 - Progress: Progressing toward goal  General Temperature Spikes Noted: No Respiratory Status: Room air Behavior/Cognition: Cooperative;Alert;Hard of hearing Oral Cavity - Dentition: Poor condition Patient Positioning: Upright in bed  Oral Cavity - Oral Hygiene Does patient have any of the following "at risk" factors?: None of the above Brush patient's teeth BID with toothbrush (using toothpaste with fluoride): Yes Patient is HIGH RISK - Oral Care Protocol followed (see row info): Yes Patient is AT RISK - Oral Care Protocol followed (see row info): Yes   Dysphagia Treatment Treatment focused on: Skilled observation of diet  tolerance;Patient/family/caregiver education Treatment Methods/Modalities: Skilled observation;Differential diagnosis Patient observed directly with PO's: Yes Type of PO's observed: Regular;Dysphagia 1 (puree);Thin liquids;Nectar-thick liquids Feeding: Able to feed self;Needs assist Liquids provided via: Cup;No straw Pharyngeal Phase Signs & Symptoms: Suspected delayed swallow initiation;Audible swallow;Delayed throat clear;Delayed cough;Multiple swallows Type of cueing: Verbal Amount of cueing: Moderate   GO     Aaron [name] 09/27/2012, 4:16 PM

## 2012-09-27 NOTE — Progress Notes (Signed)
Inpatient Diabetes Program Recommendations  AACE/ADA: New Consensus Statement on Inpatient Glycemic Control (2013)  Target Ranges:  Prepandial:   less than 140 mg/dL      Peak postprandial:   less than 180 mg/dL (1-2 hours)      Critically ill patients:  140 - 180 mg/dL   Reason for Visit: CBG's very high this morning.  Agree with start of Lantus 15 units daily.  Consider CBG's q 4 hours with sensitive correction.  A1C=8.0%.  Unsure what has caused increase in CBG's today?  Will follow.  If CBG's remain greater than 400 mg/dL, consider IV insulin.

## 2012-09-27 NOTE — Progress Notes (Signed)
09/27/2012 Martie Round, OTR/L Pager: [phone]

## 2012-09-27 NOTE — Progress Notes (Signed)
PT Cancellation Note  Patient Details Name: [name] MRN: [id number] DOB: 09/17/1924   Cancelled Treatment:    Treatment cancelled due to MD requesting per note for patient to remain flat in bed for remainder of day. Will have PT/OT reassess as appropriate   Rease Wence, Adline Potter 09/27/2012 Fredrich Birks PTA [phone] pager [phone] office    Martie Round 09/27/2012, 10:43 AM

## 2012-09-27 NOTE — Progress Notes (Signed)
Paged on call doctor about cbg 476 and heart rate of 165. Patient lying quietly in bed with no chest pain or other distressing symptoms.  On call Dr. Loura Pardon said she would pass information on to oncoming doctor. Orders given for sliding scale insulin.  Insulin administered.  Will pass on this information to oncoming RN.

## 2012-09-27 NOTE — Progress Notes (Signed)
Medical Student Daily Progress Note   Subjective:    Interval Events:  Patient has not been tolerating po intake.  His CBG went up to 517 this am and per his daughter he was more agitated than normal.  His neuro exam worsened from yesterday, so he was sent for a stat CT head, which was negative for hemorrhage but shows that the posterior left frontal lobe acute infarct appears larger than on the prior MR.        Objective:    Vital Signs:   Temp:  [97 F (36.1 C)-98.2 F (36.8 C)] 97.8 F (36.6 C) (10/10 0600) Pulse Rate:  [102-126] 108  (10/10 0600) Resp:  [17-18] 18  (10/10 0600) BP: (105-139)/(48-67) 136/67 mmHg (10/10 0600) SpO2:  [92 %-98 %] 96 % (10/10 0600) Last BM Date: 09/26/12   Weights: 24-hour Weight change:   Filed Weights   09/26/12 0321  Weight: 67.2 kg (148 lb 2.4 oz)     Intake/Output:   Intake/Output Summary (Last 24 hours) at 09/27/12 0739 Last data filed at 09/27/12 0100  Gross per 24 hour  Intake      0 ml  Output     50 ml  Net    -50 ml       Physical Exam: Vitals: T 97.9 P 105 R 18 BP 168/92 SpO2 98%  General: Pleasant lying in bed in NAD.  Skin: Bruising on dorsum of hands. Darkening of skin on R LE.  Head: Normocephalic, atraumatic. Right-sided mouth droop.  Eyes: PERRLA. EOMI. No scleral icterus.  Ears: Normal gross hearing bilaterally.  Mouth: Non-erythematous. No tongue deviation. Uvula elevates symmetrically.  CV: Regular rate, irregular rhythm. No m/r/g. No carotid bruits.  Resp: Normal work of breathing. CTAB.  Abd: Soft, non-tender, non-distended. Hypoactive bowel sounds. No bruits.  MSK/Ext: L BKA. See neuro exam for strength assessment. Left-sided back tenderness to palpation. Right DP and PT pulses 1+. RLE is warm with dark and dry skin.  Neuro: Right-sided mouth/lower facial weakness. CN II-XII otherwise intact bilaterally. RUE 2/5 strength and cannot open R hand. LUE 5/5 strength. RLE and LLE 5/5 strength. Sensation normal  and equal bilaterally. Abnormal finger-to-nose and RAM on the right secondary to weakness. Normal FTN and RAM on the left. 2+ L brachial reflex; not able to elicit a R brachial reflex. 2+ patellar reflex bilaterally.    Labs: Basic Metabolic Panel:  Lab 09/26/12 1478 09/25/12 1122 09/25/12 1001 09/25/12 0945  NA 135 137 138 133*  K 4.4 3.8 3.8 3.9  CL 98 100 100 97  CO2 22 -- -- 26  GLUCOSE 193* 177* 185* 183*  BUN 15 20 20 19   CREATININE 0.91 1.30 1.20 1.09  CALCIUM 9.1 -- -- 9.1  MG 2.2 -- -- --  PHOS -- -- -- --    Liver Function Tests:  Lab 09/25/12 0945  AST 21  ALT 10  ALKPHOS 96  BILITOT 0.4  PROT 7.2  ALBUMIN 2.9*   CBC:  Lab 09/25/12 1122 09/25/12 1001 09/25/12 0945  WBC -- -- 11.7*  NEUTROABS -- -- 8.4*  HGB 13.9 13.6 12.4*  HCT 41.0 40.0 38.6*  MCV -- -- 89.4  PLT -- -- 360    Cardiac Enzymes:  Lab 09/25/12 0945  CKTOTAL --  CKMB --  CKMBINDEX --  TROPONINI <0.30    CBG:  Lab 09/27/12 0637 09/26/12 2112 09/26/12 1625 09/26/12 1208 09/26/12 0815  GLUCAP 476* 256* 344* 291* 202*    Coagulation  Studies:  Basename 09/25/12 0930  LABPROT 16.4*  INR 1.35    Other results: EKG Results 09/26/12: Rate:  91 PR:  -- QRS:  124 ms QTc:  504 ms EKG: Atrial fibrillation, RBBB  Imaging: Ct Angio Head W/cm &/or Wo Cm  09/25/2012  *RADIOLOGY REPORT*  Clinical Data:  Stroke.  Right-sided hemiparesis and unable to speak.  Last seen normal at 5 o'clock a.m.  CT ANGIOGRAPHY HEAD AND NECK  Technique:  Multidetector CT imaging of the head and neck was performed using the standard protocol during bolus administration of intravenous contrast.  Multiplanar CT image reconstructions including MIPs were obtained to evaluate the vascular anatomy. Carotid stenosis measurements (when applicable) are obtained utilizing NASCET criteria, using the distal internal carotid diameter as the denominator.  Contrast: 50mL OMNIPAQUE IOHEXOL 350 MG/ML SOLN  Comparison:  CT head  without contrast 09/25/2012.  CTA NECK  Findings:  A standard three-vessel arch configuration is present. The vertebral arteries both originate from the subclavian arteries. There is dense calcification at the origin of the nondominant right vertebral artery, suggesting high-grade stenosis.  There are tandem stenoses and C6-7 on the right and again at C5-6 on the right. There is high-grade stenosis at the dural margin of the right vertebral artery without significant contribution to the vertebral basilar junction.  Calcification at the origin of the dominant left vertebral artery is associated with mild stenosis of less than 50%.  There is some tortuosity in the remaining left vertebral artery without significant stenosis through the vertebral basilar junction.  The right to common carotid arteries within normal limits.  Dense atherosclerotic calcifications are as proximal right internal carotid artery without significant stenosis relative to the distal vessel.  The cervical ICA is tortuous without significant stenosis.  The left common carotid artery is within normal limits.  Dense calcifications are present at the origin of the proximal left internal carotid artery.  The minimal luminal diameters 2.4 mm. Compared with a distal measurement of 4.9 mm, there is no significant stenosis.  Moderate tortuosity is present in the more distal left internal carotid artery without significant stenosis to the skull base.  The soft tissues of the neck demonstrate no focal mucosal or submucosal lesions.  No significant adenopathy is present.  Bone windows demonstrate moderate endplate degenerative changes at C3-4, C5-6, and C6-7.   Review of the MIP images confirms the above findings.  IMPRESSION:  1.  High-grade stenosis of the right vertebral artery at its origin with tandem stenoses at C6-7, C5-6, and at the dural margin.  The distal vertebral artery is essentially occluded. 2.  Mild narrowing of the proximal left vertebral  artery, dominant vessel without other significant stenoses through the neck. 3.  50% stenosis of the proximal left internal carotid artery. 4.  Atherosclerotic calcifications of the proximal right internal carotid artery without significant stenosis. 5.  Tortuosity of the internal carotid arteries bilaterally as well as the left vertebral artery, likely secondary to hypertension. 6.  Moderate spondylosis of the cervical spine.  CTA HEAD  Findings:  The postcontrast images of the head demonstrate no acute cortical infarct.  Atrophy and moderate white matter changes are again seen.  Remote subcortical white matter lacunar infarcts are noted within the left external capsule.  The postcontrast images demonstrate no pathologic enhancement.  Circumferential mucosal thickening is present in the left maxillary sinus.  There is a small fluid level in the left sphenoid sinus. The paranasal sinuses and mastoid air cells are clear.  Dense calcifications are present within the right cavernous carotid artery there is a focal stenosis of at least 50% in the supraclinoid right internal carotid artery, just proximal to the posterior communicating artery.  Similar dense calcifications are present throughout the left cavernous carotid artery with high-grade stenosis of the anterior genu there is a moderate tandem stenosis of the distal aspect of the calcifications.   Mild irregularity is present within the proximal M1 and A1 segments bilaterally without to high-grade stenosis.  The right anterior communicating artery is patent.  The MCA bifurcations are within normal limits bilaterally.  There is significant attenuation of distal MCA branch vessels bilaterally without a significant proximal stenosis or occlusion.  The basilar artery is within normal limits.  The left P1 segment originates from the basilar tip.  The right posterior cerebral artery is of fetal type.  The distal PCA branch vessels demonstrate significant attenuation.   There is no significant proximal stenosis or occlusion.   Review of the MIP images confirms the above findings.  IMPRESSION:  1.  High-grade stenosis within the cavernous left internal carotid artery. 2.  Moderate supraclinoid internal carotid artery stenoses bilaterally. 3.  Mild to moderate irregularity of the proximal A1 and M1 segments bilaterally without significant stenosis. 4.  Moderate small vessel disease. 5.  Apart from the cavernous and supraclinoid left internal carotid artery stenoses, no other focal lesion is evident to explain the patient's symptoms.   Original Report Authenticated By: Jamesetta Orleans. MATTERN, M.D.    Dg Lumbar Spine Complete  09/25/2012  *RADIOLOGY REPORT*  Clinical Data: Back pain.  Recent fall.  LUMBAR SPINE - COMPLETE 4+ VIEW  Comparison: None  Findings: Prior cement vertebral augmentation of T11, T12, and L1 for  fracture treatment.  Moderate fracture of L3 is indeterminate in age.  Mild superior endplate fracture L4 of indeterminate age.  If the patient has acute pain in this area, consider MRI for further evaluation. Normal lumbar alignment.  Mild facet degeneration at L5-S1.  No pars defect.  2 cm right renal calculus compatible with a partial staghorn calculus  IMPRESSION: Cement vertebral augmentation at T11, T12, and L1.  Compression fractures of L3 and L4 of indeterminate age.  2 cm partial staghorn calculus in the right renal pelvis.   Original Report Authenticated By: Camelia Phenes, M.D.    Ct Head Wo Contrast  09/25/2012  *RADIOLOGY REPORT*  Clinical Data: Sudden onset of right-sided weakness and facial droop.  CT HEAD WITHOUT CONTRAST  Technique:  Contiguous axial images were obtained from the base of the skull through the vertex without contrast.  Comparison: None.  Findings: No intracranial hemorrhage.  Small vessel disease type changes.  Remote right occipital lobe and right cerebellar infarcts suspected without CT evidence of large acute infarct.  Global  atrophy without hydrocephalus.  No intracranial mass lesion detected on this unenhanced exam.  Prominent vascular calcifications.  Moderate mucosal thickening left maxillary sinus.  IMPRESSION: No intracranial hemorrhage.  Small vessel disease type changes.  Remote right occipital lobe and right cerebellar infarcts suspected without CT evidence of large acute infarct.  Critical Value/emergent results were called by telephone at the time of interpretation on 09/25/2012 at 9:40 a.m. to Dr. Amada Jupiter, who verbally acknowledged these results.   Original Report Authenticated By: Fuller Canada, M.D.    Ct Angio Neck W/cm &/or Wo/cm  09/25/2012  *RADIOLOGY REPORT*  Clinical Data:  Stroke.  Right-sided hemiparesis and unable to speak.  Last seen normal at  5 o'clock a.m.  CT ANGIOGRAPHY HEAD AND NECK  Technique:  Multidetector CT imaging of the head and neck was performed using the standard protocol during bolus administration of intravenous contrast.  Multiplanar CT image reconstructions including MIPs were obtained to evaluate the vascular anatomy. Carotid stenosis measurements (when applicable) are obtained utilizing NASCET criteria, using the distal internal carotid diameter as the denominator.  Contrast: 50mL OMNIPAQUE IOHEXOL 350 MG/ML SOLN  Comparison:  CT head without contrast 09/25/2012.  CTA NECK  Findings:  A standard three-vessel arch configuration is present. The vertebral arteries both originate from the subclavian arteries. There is dense calcification at the origin of the nondominant right vertebral artery, suggesting high-grade stenosis.  There are tandem stenoses and C6-7 on the right and again at C5-6 on the right. There is high-grade stenosis at the dural margin of the right vertebral artery without significant contribution to the vertebral basilar junction.  Calcification at the origin of the dominant left vertebral artery is associated with mild stenosis of less than 50%.  There is some  tortuosity in the remaining left vertebral artery without significant stenosis through the vertebral basilar junction.  The right to common carotid arteries within normal limits.  Dense atherosclerotic calcifications are as proximal right internal carotid artery without significant stenosis relative to the distal vessel.  The cervical ICA is tortuous without significant stenosis.  The left common carotid artery is within normal limits.  Dense calcifications are present at the origin of the proximal left internal carotid artery.  The minimal luminal diameters 2.4 mm. Compared with a distal measurement of 4.9 mm, there is no significant stenosis.  Moderate tortuosity is present in the more distal left internal carotid artery without significant stenosis to the skull base.  The soft tissues of the neck demonstrate no focal mucosal or submucosal lesions.  No significant adenopathy is present.  Bone windows demonstrate moderate endplate degenerative changes at C3-4, C5-6, and C6-7.   Review of the MIP images confirms the above findings.  IMPRESSION:  1.  High-grade stenosis of the right vertebral artery at its origin with tandem stenoses at C6-7, C5-6, and at the dural margin.  The distal vertebral artery is essentially occluded. 2.  Mild narrowing of the proximal left vertebral artery, dominant vessel without other significant stenoses through the neck. 3.  50% stenosis of the proximal left internal carotid artery. 4.  Atherosclerotic calcifications of the proximal right internal carotid artery without significant stenosis. 5.  Tortuosity of the internal carotid arteries bilaterally as well as the left vertebral artery, likely secondary to hypertension. 6.  Moderate spondylosis of the cervical spine.  CTA HEAD  Findings:  The postcontrast images of the head demonstrate no acute cortical infarct.  Atrophy and moderate white matter changes are again seen.  Remote subcortical white matter lacunar infarcts are noted within  the left external capsule.  The postcontrast images demonstrate no pathologic enhancement.  Circumferential mucosal thickening is present in the left maxillary sinus.  There is a small fluid level in the left sphenoid sinus. The paranasal sinuses and mastoid air cells are clear.  Dense calcifications are present within the right cavernous carotid artery there is a focal stenosis of at least 50% in the supraclinoid right internal carotid artery, just proximal to the posterior communicating artery.  Similar dense calcifications are present throughout the left cavernous carotid artery with high-grade stenosis of the anterior genu there is a moderate tandem stenosis of the distal aspect of the calcifications.  Mild irregularity is present within the proximal M1 and A1 segments bilaterally without to high-grade stenosis.  The right anterior communicating artery is patent.  The MCA bifurcations are within normal limits bilaterally.  There is significant attenuation of distal MCA branch vessels bilaterally without a significant proximal stenosis or occlusion.  The basilar artery is within normal limits.  The left P1 segment originates from the basilar tip.  The right posterior cerebral artery is of fetal type.  The distal PCA branch vessels demonstrate significant attenuation.  There is no significant proximal stenosis or occlusion.   Review of the MIP images confirms the above findings.  IMPRESSION:  1.  High-grade stenosis within the cavernous left internal carotid artery. 2.  Moderate supraclinoid internal carotid artery stenoses bilaterally. 3.  Mild to moderate irregularity of the proximal A1 and M1 segments bilaterally without significant stenosis. 4.  Moderate small vessel disease. 5.  Apart from the cavernous and supraclinoid left internal carotid artery stenoses, no other focal lesion is evident to explain the patient's symptoms.   Original Report Authenticated By: Jamesetta Orleans. MATTERN, M.D.    Mr Brain Wo  Contrast  09/25/2012  *RADIOLOGY REPORT*  Clinical Data: Code stroke.  New onset of right-sided weakness.  MRI HEAD WITHOUT CONTRAST  Technique:  Multiplanar, multiecho pulse sequences of the brain and surrounding structures were obtained according to standard protocol without intravenous contrast.  Comparison: CTA head and neck 09/25/2012.  Findings: The diffusion weighted images demonstrate a focus of restricted effusion in the posterior left frontal lobe, along the central sulcus.  There is no significant associated T2 or FLAIR hyperintensity.  Moderate generalized atrophy is present.  Mild periventricular subcortical T2 and FLAIR hyperintensities are present bilaterally.  Flow is present in the major intracranial arteries.  Multiple remote lacunar infarcts are noted within the cerebellum bilaterally, worse on the right.  The mastoid air cells are clear.  A fluid levels present in the left maxillary sinus.  Circumferential mucosal thickening is also present in the left maxillary sinus.  Remaining paranasal sinuses are clear.  IMPRESSION:  1.  Acute non hemorrhagic infarct within the posterior left frontal lobe, along the central sulcus measures less than 2 cm. 2.  Atrophy and mild diffuse white matter disease likely reflects the sequelae of chronic microvascular ischemia. 3.  Remote lacunar infarcts of the cerebellum bilaterally. 4.  Acute left maxillary sinus disease.  These results were called by telephone on 09/25/2012 at 12:40 p.m. to Dr. Ignacia Palma, who verbally acknowledged these results.   Original Report Authenticated By: Jamesetta Orleans. MATTERN, M.D.       Medications:    Infusions:    . sodium chloride 75 mL/hr at 09/25/12 1817     Scheduled Medications:    . aspirin  81 mg Oral Once  . atorvastatin  80 mg Oral q1800  . dabigatran  150 mg Oral Q12H  . DULoxetine  30 mg Oral QHS  . fentaNYL  50 mcg Transdermal Q72H  . influenza  inactive virus vaccine  0.5 mL Intramuscular  Tomorrow-1000  . insulin aspart  0-9 Units Subcutaneous TID WC  . insulin aspart  10 Units Subcutaneous Once  . pantoprazole  40 mg Oral Q1200  . DISCONTD: aspirin  300 mg Rectal Q2000  . DISCONTD: aspirin  325 mg Oral Q2000  . DISCONTD: ondansetron (ZOFRAN) IV  4 mg Intravenous Q6H     PRN Medications: acetaminophen, bisacodyl, ketorolac, ondansetron (ZOFRAN) IV   Assessment/ Plan:    Mr.  Beas is an 78 yo gentleman with multiple comorbidities, including HTN, DM, CHF, CAD, a fib, PVD, and L BKA who presents with worsening right-sided weakness and difficulty speaking secondary to an acute ischemic stroke.   #Ischemic stroke  Patient is still experiencing right-sided UE weakness (worse than yesterday) and aphasia/dysarthria (also worse than yesterday) secondary to an ischemic stroke in the left MCA territory that was confirmed by brain MRI.  He is also less alert than yesterday. Due to worsening neuro exam, patient went for a stat head CT that is negative for hemorrhage but shows that the posterior left frontal lobe acute infarct appears larger than on the prior MR.  This could be due to extension of the infarct or edema.  Change in mental status could be due to extension of his stroke, infection, or markedly increased blood glucose (500s).    CTA of head and neck reveal high-grade stenosis in the cavernous left internal carotid artery. Patient initially presented with RUE paralysis and complete expressive aphasia and/or dysarthria.  His symptoms were improving until this morning when he had 2/5 strength in the RUE and hand, worse than yesterday.  His speech deficit seems to be secondary to dysarthria rather than true expressive aphasia. He has no trouble with comprehension. Patient is at high risk for an embolic or thrombotic stroke because he has chronic a fib and stopped his warfarin x 4 days, and he has CAD, hypertension, and DM. CHADS2 score is 6. 2D echo showed EF 65%, mild LVH, and  calcified MV annulus.  Neuro consulted, appreciate recs.  --Speech initially recommended dysphagia 3 (mechanical soft) diet; thin liquid.  However, he has not been able to eat.  NPO now, will get a speech re-evaluation.  --Frequent neuro checks q 2 hrs --vitals q 2 hrs --ASA changed from po to suppository --Holding pradaxa.  Per neuro, pradaxa 150 mg po BID for secondary stroke prevention. However, daughter has decided that she prefers Warfarin for anticoagulation.  She has discussed the risks and benefits with the primary team, neurologist, and her daughter-in-law (who is a pharmacy resident at [employer]  Pharmacy will re-calculate creatinine clearance in setting of BKA and possibly re-dose pradaxa once it is re-started.  He has received 2 doses (last one on 10/9 at 2200) --500 mL bolus NS --CXR showed bibasilar atelectasis.  Will treat empirically for aspiration pneumonia as described below --follow-up U/A to r/o source of infection --follow-up CBC and BMET --PM&R recommends inpatient rehabilitation.  He will remain on our service today given his worsening neurological deficits. --2D Echocardiogram shows EF 65-70%, mild LVH, MV with calcified annulus, and a bioprosthetic aortic valve  --No need for Carotid dopplers given CTA neck already done.  --Risk factor modification  --Telemetry monitoring  --atorvastatin 80 mg po once daily. Holding for now since he is NPO.  --HgbA1c 8%  --fasting lipid panel: chol 132 TG 135 HDL 31 LDL 74 VLDL 27 CHOL/HDL ratio 4.3   #Aspiration pneumonia Patient was on a soft mechanical diet, but her nursing he was not tolerating po intake and was not able to swallow breakfast this morning.  With his right-sided facial weakness, he is at increased risk for aspiration pneumonia.  CXR today shows mild bibasilar airspace disease, most likely atelectasis.   --empiric antibiotic tx with clindamycin 600 mg IV TID  #Back pain  Patient c/o thoracic back pain on the left  side that is the same as yesterday. Xray spine show compression fractures at L3 and  L4 of indeterminate age whose location does not correlate with his pain. Patient had two falls last week, the most recent on Thursday when he slipped on a sock in the bathroom. He denies LOC or head trauma. On exam patient is tender to palpation on the left paravertebral region at lower thoracic/upper lumbar spine.  --d/c'dfentanyl patch 50 mcg in setting of change in mental status --d/c'd ketorolac 30 mg IV Q6hrs prn pain in setting of change in mental status  #Hypertension  BPs have been 100-140s over 40-60s. Home regimen: amlodipine 5 mg po BID, losartan 50 mg po BID, metoprolol 75 mg po BID  --Will not treat hypertension at this time in the setting of acute ischemic stroke.   #Diabetes mellitus  Patient's CBG at 630 am 476. Received 10 U aspart and CBG at 800 am 517.  Per nursing patient was lying quietly in bed with no chest pain or other distressing symptoms. --gave lantus 15 U.  Will titrate up as necessary.  His current home dose is lantus 28 U. --changed from sensitive to moderate SSI with aspart while in house .  Goal CBG <200 in setting of ischemic stroke.  #Chronic A fib  --Holding pradaxa for now. Per neuro,started on pradaxa 150 mg BID on 10/9. He has received 2 doses (last one on 10/9 at 2200).  However, daughter (who is healthcare power of attorney) wants him re-started on warfarin instead.   #FEN/GI  --NPO now.  F/u speech re-evaluation. --IVF NS @ 75 mL/hr. D/C once patient has adequate po intake, especially with his h/o CHF.   #PPX  --SCDs --protonix IV 40 mg once daily    This is a Psychologist, occupational Note.  The care of the patient was discussed with Dr. Loistine Chance and the assessment and plan formulated with their assistance.  Please see their attached note or addendum for official documentation of the daily encounter.  I have reviewed and agree with Medical student Vanessa Ralphs. Please see my  separate note .   Almyra Deforest, MD

## 2012-09-27 NOTE — Progress Notes (Addendum)
ANTIBIOTIC CONSULT NOTE - INITIAL  Pharmacy Consult for vancomycin Indication: rule out pneumonia  Allergies  Allergen Reactions  . Metformin And Related Hives  . Penicillins Hives and Swelling    Patient Measurements: Height: 5\' 8"  (172.7 cm) Weight: 151 lb 14.4 oz (68.9 kg) IBW/kg (Calculated) : 68.4    Vital Signs: Temp: 97.8 F (36.6 C) (10/10 0900) Temp src: Oral (10/10 0900) BP: 136/55 mmHg (10/10 0900) Pulse Rate: 94  (10/10 0900) Intake/Output from previous day: 10/09 0701 - 10/10 0700 In: -  Out: 50 [Urine:50] Intake/Output from this shift:    Labs:  Basename 09/26/12 0625 09/25/12 1122 09/25/12 1001 09/25/12 0945  WBC -- -- -- 11.7*  HGB -- 13.9 13.6 12.4*  PLT -- -- -- 360  LABCREA -- -- -- --  CREATININE 0.91 1.30 1.20 --   Estimated Creatinine Clearance: 56.4 ml/min (by C-G formula based on Cr of 0.91). No results found for this basename: VANCOTROUGH:2,VANCOPEAK:2,VANCORANDOM:2,GENTTROUGH:2,GENTPEAK:2,GENTRANDOM:2,TOBRATROUGH:2,TOBRAPEAK:2,TOBRARND:2,AMIKACINPEAK:2,AMIKACINTROU:2,AMIKACIN:2, in the last 72 hours   Microbiology: No results found for this or any previous visit (from the past 720 hour(s)).  Medical History: Past Medical History  Diagnosis Date  . Hypertension   . Diabetes mellitus   . CHF (congestive heart failure)   . Coronary artery disease   . Atrial fibrillation   . PVD (peripheral vascular disease)   . S/P CABG x 1 2008  . S/P aortic valve replacement with porcine valve 2008    Medications:  Prescriptions prior to admission  Medication Sig Dispense Refill  . amLODipine (NORVASC) 5 MG tablet Take 5 mg by mouth 2 (two) times daily.      . Calcium Carbonate-Vitamin D (CALCIUM 600+D) 600-400 MG-UNIT per tablet Take 1 tablet by mouth daily.      . Cholecalciferol (VITAMIN D-3) 1000 UNITS CAPS Take 1,000 Units by mouth at bedtime.      . DULoxetine (CYMBALTA) 30 MG capsule Take 30 mg by mouth at bedtime.       Marland Kitchen  HYDROCODONE-ACETAMINOPHEN PO Take 1-2 tablets by mouth every 6 (six) hours as needed. For pain      . insulin aspart (NOVOLOG) 100 UNIT/ML injection Inject 3-6 Units into the skin 3 (three) times daily before meals. 6 units every morning, 3 units with lunch and 5 units with dinner      . losartan (COZAAR) 100 MG tablet Take 50 mg by mouth 2 (two) times daily.      Marland Kitchen losartan (COZAAR) 50 MG tablet Take 50 mg by mouth 2 (two) times daily.      . metoprolol (LOPRESSOR) 50 MG tablet Take 75 mg by mouth 2 (two) times daily.      Marland Kitchen nystatin (MYCOSTATIN/NYSTOP) 100000 UNIT/GM POWD Apply topically 2 (two) times daily as needed. Apply to buttocks for rash as needed      . simvastatin (ZOCOR) 40 MG tablet Take 40 mg by mouth every evening.      . torsemide (DEMADEX) 10 MG tablet Take 10 mg by mouth 2 (two) times daily.       Marland Kitchen warfarin (COUMADIN) 2.5 MG tablet Take 2.5 mg by mouth daily.       Assessment: 47 to man admitted with R sided weakness and aphasia.  He is starting vancomycin today r/o PNA.    Goal of Therapy:  Vancomycin trough level 15-20 mcg/ml  Plan:  Vancomycin 500 mg Iv q12 hours F/u renal function, clinical course and cultures. Check vancomycin trough when appropriate.  Talbert Cage Poteet 09/27/2012,12:08  PM  Addum:  SrCr is 2.44.  Will change vanc to 1gm IV q48 hours.

## 2012-09-27 NOTE — Progress Notes (Signed)
Rehab admissions - Evaluated for possible admission.  MD reports patient currently down for stat CT head to address neurological worsening.  I spoke with patient's family and gave them rehab booklets.  Family very appreciative of staff and care patient has received thus far.  I will follow progress for now.  Call me for questions.  #[phone]

## 2012-09-27 NOTE — Progress Notes (Signed)
Stroke Team Progress Note  HISTORY Juma Oxley is an 78 y.o. male awoke 09/25/2012 am with right-sided weakness and aphasia. He had been seen by his caregiver at 6 AM when she checked his glucose. At that time, he was able to talk and move his right side well. At 8 AM when she rechecked on him, he was not moving his right side or talking. She called 911.   On arrival here, he had some improvement with strength in his right arm. Over the course of my examination, he began speaking. Of note, he was recently found to be subtherapeutic on his Coumadin, and therefore it was held for 4 days. He was restarted on Sunday.  Patient was not a TPA candidate secondary to presenting outside the window. He was admitted to for further evaluation and treatment.  SUBJECTIVE Neuro worsening this am. Heart rate up to 165 during the night.  OBJECTIVE Most recent Vital Signs: Filed Vitals:   09/26/12 1800 09/26/12 2200 09/27/12 0200 09/27/12 0600  BP: 118/62 139/60 135/61 136/67  Pulse: 103 103 113 108  Temp: 98.2 F (36.8 C) 98 F (36.7 C) 97.9 F (36.6 C) 97.8 F (36.6 C)  TempSrc: Oral Oral Oral Oral  Resp: 18 18 18 18   Height:      Weight:      SpO2: 97% 92% 95% 96%   CBG (last 3)   Basename 09/27/12 0637 09/26/12 2112 09/26/12 1625  GLUCAP 476* 256* 344*   Intake/Output from previous day: 10/09 0701 - 10/10 0700 In: -  Out: 50 [Urine:50]  IV Fluid Intake:      . sodium chloride 75 mL/hr at 09/25/12 1817    MEDICATIONS     . aspirin  81 mg Oral Once  . atorvastatin  80 mg Oral q1800  . dabigatran  150 mg Oral Q12H  . DULoxetine  30 mg Oral QHS  . fentaNYL  50 mcg Transdermal Q72H  . influenza  inactive virus vaccine  0.5 mL Intramuscular Tomorrow-1000  . insulin aspart  0-9 Units Subcutaneous TID WC  . insulin aspart  10 Units Subcutaneous Once  . pantoprazole  40 mg Oral Q1200  . DISCONTD: aspirin  300 mg Rectal Q2000  . DISCONTD: aspirin  325 mg Oral Q2000  . DISCONTD:  ondansetron (ZOFRAN) IV  4 mg Intravenous Q6H   PRN:  acetaminophen, bisacodyl, ketorolac, ondansetron (ZOFRAN) IV  Diet:  Dysphagia 3 thin liquids Activity:  Bedrest DVT Prophylaxis:  SCDs   CLINICALLY SIGNIFICANT STUDIES Basic Metabolic Panel:   Lab 09/26/12 0625 09/25/12 1122 09/25/12 0945  NA 135 137 --  K 4.4 3.8 --  CL 98 100 --  CO2 22 -- 26  GLUCOSE 193* 177* --  BUN 15 20 --  CREATININE 0.91 1.30 --  CALCIUM 9.1 -- 9.1  MG 2.2 -- --  PHOS -- -- --   Liver Function Tests:   Lab 09/25/12 0945  AST 21  ALT 10  ALKPHOS 96  BILITOT 0.4  PROT 7.2  ALBUMIN 2.9*   CBC:   Lab 09/25/12 1122 09/25/12 1001 09/25/12 0945  WBC -- -- 11.7*  NEUTROABS -- -- 8.4*  HGB 13.9 13.6 --  HCT 41.0 40.0 --  MCV -- -- 89.4  PLT -- -- 360   Coagulation:   Lab 09/25/12 0930  LABPROT 16.4*  INR 1.35   Cardiac Enzymes:   Lab 09/25/12 0945  CKTOTAL --  CKMB --  CKMBINDEX --  TROPONINI <0.30  Urinalysis: No results found for this basename: COLORURINE:2,APPERANCEUR:2,LABSPEC:2,PHURINE:2,GLUCOSEU:2,HGBUR:2,BILIRUBINUR:2,KETONESUR:2,PROTEINUR:2,UROBILINOGEN:2,NITRITE:2,LEUKOCYTESUR:2 in the last 168 hours Lipid Panel    Component Value Date/Time   CHOL 132 09/26/2012 0625   TRIG 135 09/26/2012 0625   HDL 31* 09/26/2012 0625   CHOLHDL 4.3 09/26/2012 0625   VLDL 27 09/26/2012 0625   LDLCALC 74 09/26/2012 0625   HgbA1C  Lab Results  Component Value Date   HGBA1C 8.0* 09/25/2012    Urine Drug Screen:   No results found for this basename: labopia,  cocainscrnur,  labbenz,  amphetmu,  thcu,  labbarb    Alcohol Level: No results found for this basename: ETH:2 in the last 168 hours  CT of the brain  09/25/2012  No intracranial hemorrhage.  Small vessel disease type changes.  Remote right occipital lobe and right cerebellar infarcts suspected without CT evidence of large acute infarct.    Ct Angio Head 09/25/2012   1.  High-grade stenosis within the cavernous left internal  carotid artery. 2.  Moderate supraclinoid internal carotid artery stenoses bilaterally. 3.  Mild to moderate irregularity of the proximal A1 and M1 segments bilaterally without significant stenosis. 4.  Moderate small vessel disease. 5.  Apart from the cavernous and supraclinoid left internal carotid artery stenoses, no other focal lesion is evident to explain the patient's symptoms.    Dg Lumbar Spine Complete 09/25/2012   Cement vertebral augmentation at T11, T12, and L1.  Compression fractures of L3 and L4 of indeterminate age.  2 cm partial staghorn calculus in the right renal pelvis.     Ct Angio Neck 09/25/2012   1.  High-grade stenosis of the right vertebral artery at its origin with tandem stenoses at C6-7, C5-6, and at the dural margin.  The distal vertebral artery is essentially occluded. 2.  Mild narrowing of the proximal left vertebral artery, dominant vessel without other significant stenoses through the neck. 3.  50% stenosis of the proximal left internal carotid artery. 4.  Atherosclerotic calcifications of the proximal right internal carotid artery without significant stenosis. 5.  Tortuosity of the internal carotid arteries bilaterally as well as the left vertebral artery, likely secondary to hypertension. 6.  Moderate spondylosis of the cervical spine.   MRI of the brain  09/25/2012 1.  Acute non hemorrhagic infarct within the posterior left frontal lobe, along the central sulcus measures less than 2 cm. 2.  Atrophy and mild diffuse white matter disease likely reflects the sequelae of chronic microvascular ischemia. 3.  Remote lacunar infarcts of the cerebellum bilaterally. 4.  Acute left maxillary sinus disease.  MRA of the brain  See CTA head  2D Echocardiogram  EF 65-70% with no source of embolus. A bioprosthesis was present  Carotid Doppler  See CTA neck  EKG  atrial fibrillation, rate 92.   Therapy Recommendations PT - CIR ; OT - CIR   Physical Exam   Elderly Caucasian male  currently not in distress.Awake alert. Afebrile. Head is nontraumatic. Neck is supple without bruit. Hearing is normal. Cardiac exam no murmur or gallop. Lungs are clear to auscultation. Distal pulses are well felt except Left leg below-knee amputation with bandage. Neurological Exam : awake alert and moderate expressive aphasia with speaking only a few words some occasional short sentences and difficult to understand. Follows midline and one step commands only. Extraocular moments are full range without nystagmus. Fundi were not visualized. Diminished blink to threat on the right compared to the left. Mild right lower facial weakness. Tongue midline. Significant right hemiplegia  with 0/5 strength in the right upper extremity and 1/5 in right lower extremity. Good antigravity strength in the left. Coordination sensation cannot be reliably tested. Gait was not tested.  ASSESSMENT Aaron [name] is a 78 y.o. male presenting with right hemiparesis and aphasia. Imaging confirms a  posterior left frontal lobe infarct. Infarct felt to be embolic secondary to known atrial fibrillation.  On warfarin prior to admission, though INR subtherapeutic on arrival at 1.35 after coumadin being held x 4 days, resumed on Sunday prior to admission. Now on pradaxa 150 mg bid for secondary stroke prevention. Overnight with neuro worsening, dysarthria, right arm hemiparesis and dysphagia.    Porcine valve atrial fibrillation, heart rate up to 165 during the night Hypertension Diabetes, uncontrolled, HgbA1c 8.0 CAD PVD LDL 74  Hospital day # 2  TREATMENT/PLAN  Repeat CT head to look for hemorrhage  If no bleeding, continue Pradaxa 150 mg bid for secondary stroke prevention.  Keep flat in bed today, give IVF  NPO  Reassess swallow  Resume low dose aspirin daily for PAD/CAD  Aggressive glycemic control  D/W Dr Lonzo Cloud and internal medicine teaching service   Annie Main, MSN, RN, ANVP-BC, ANP-BC,  GNP-BC [facility] Pager: [phone] 09/27/2012 7:56 AM  Scribe for Dr. Delia Heady, Stroke Center Medical Director, who has personally reviewed chart, pertinent data, examined the patient and developed the plan of care. Pager:  [phone]

## 2012-09-27 NOTE — Progress Notes (Signed)
SLP has reviewed and agrees with student's note below.  Beva Remund Willis Chiann Goffredo M.Ed CCC-SLP Pager [phone]  09/27/2012  

## 2012-09-27 NOTE — Progress Notes (Signed)
Internal Medicine Teaching Service Attending Dr.Joron Velis I have examined the patient at bedside today and discussed the management of the Patient with the resident team.The patient had worsening neuro clinical exam and CT shows evidence of worsening with no bleed. Discussed with Dr.Sethi and starting the patient on treatment for possible aspiration pneumonia.Had an extensive discussion with his daughter at bedside who understands the situation.

## 2012-09-27 NOTE — Progress Notes (Signed)
Pharmacy Student Rounding with [facility] Service. Asked to consult for appropriateness of dose for Dabigatran (Pradaxa) in this 78 year old man, admitted on 10/08 for acute ischemic stroke.    Today, patient appears (by observation of family, team, and neurology) to be demonstrating worsening symptoms of initial CVA.  It was explained to family that this could be a part of usual presentation of his initial neurologic insult/stroke.  It did raise the question for the potential for conversion of acute ischemic stroke to that of hemorrhagic stroke.  A STAT CT of head was performed, revealing no evidence of an acute bleed. Patient is currently NPO status. Dabigatran (Pradaxa) on hold.     Once patient has passed the swallow study, Dabigatran (Pradaxa) may be restarted. We are addressing the appropriateness of dose.  Family (daughter) indicates patient's SCr previously held around 1.7 to 2.0 mg/dL, but was documented yesterday to be 0.9 mg/dL for which the 150mg  BID dose of Pradaxa was commenced by neurology.  There is concern over current renal status, prompting re-evaluation of SCr today.  Results indicate current SCr to be 2.44 mg/dL and a CrCl of approximately 19 ml/min (accounting for left BKA with a 6% decrease in total body weight).    Manufacturer's FDA approved labeling for CrCl 15-30 ml/min is 75 mg po BID.  As such, upon restarting Pradaxa, we recommend Pradaxa 75 mg BID.  Would continue to monitor renal function daily and adjust dose as necessary.    If patient were to remain NPO, an option from within the new oral anticoagulant class (NOAC) would be Rivaroxaban (Xarelto), which can be crushed and placed into the stomach via tube.    Consulted with Pharmacist rounding on this patient regarding Vancomycin dose based on renal function.  Dose was changed to 1000 mg IV Q 48 hours based upon my recommendation.     Agree with above note.

## 2012-09-27 NOTE — Progress Notes (Signed)
Utilization Review Completed.  

## 2012-09-27 NOTE — Progress Notes (Signed)
CT head - no hemorrhage. Ok to continue pradaxa if ST clears for swallowing.  Annie Main, MSN, RN, ANVP-BC, ANP-BC, GNP-BC [facility] Pager: [phone] 09/27/2012 10:48 AM  Scribe for Dr. Delia Heady, Stroke Center Medical Director, who has personally reviewed chart, pertinent data, examined the patient and developed the plan of care. Pager:  [phone]

## 2012-09-27 NOTE — Progress Notes (Signed)
Patient was found to have acute renal failure. Called Dr Hyman Hopes at 5:30 pm and he noted it is most likely due to obstruction. Reccommended foley cath for now. Follow up on renal function and U/A. Recommended to hold Vancomycin in the setting of acute renal failure. Will obtain renal ultrasound.  Bladder scan at 3 pm was notable for 30 cc residual. He urinated at around 11:45 am and after that not until foley was placed around 5:30 pm. U/A was obtained and results pending.   Almyra Deforest, MD

## 2012-09-28 ENCOUNTER — Inpatient Hospital Stay (HOSPITAL_COMMUNITY): Payer: Medicare Other

## 2012-09-28 LAB — GLUCOSE, CAPILLARY
Glucose-Capillary: 103 mg/dL — ABNORMAL HIGH (ref 70–99)
Glucose-Capillary: 171 mg/dL — ABNORMAL HIGH (ref 70–99)
Glucose-Capillary: 297 mg/dL — ABNORMAL HIGH (ref 70–99)
Glucose-Capillary: 87 mg/dL (ref 70–99)
Glucose-Capillary: 93 mg/dL (ref 70–99)

## 2012-09-28 LAB — BASIC METABOLIC PANEL
BUN: 41 mg/dL — ABNORMAL HIGH (ref 6–23)
CO2: 20 mEq/L (ref 19–32)
Calcium: 9 mg/dL (ref 8.4–10.5)
Glucose, Bld: 121 mg/dL — ABNORMAL HIGH (ref 70–99)
Potassium: 4.4 mEq/L (ref 3.5–5.1)
Sodium: 139 mEq/L (ref 135–145)

## 2012-09-28 LAB — CBC
HCT: 36.3 % — ABNORMAL LOW (ref 39.0–52.0)
Hemoglobin: 11.6 g/dL — ABNORMAL LOW (ref 13.0–17.0)
MCH: 28.6 pg (ref 26.0–34.0)
RBC: 4.06 MIL/uL — ABNORMAL LOW (ref 4.22–5.81)

## 2012-09-28 LAB — HEPATIC FUNCTION PANEL
AST: 82 U/L — ABNORMAL HIGH (ref 0–37)
Albumin: 2.9 g/dL — ABNORMAL LOW (ref 3.5–5.2)
Alkaline Phosphatase: 115 U/L (ref 39–117)
Total Bilirubin: 0.5 mg/dL (ref 0.3–1.2)
Total Protein: 6.9 g/dL (ref 6.0–8.3)

## 2012-09-28 LAB — SODIUM, URINE, RANDOM: Sodium, Ur: 19 mEq/L

## 2012-09-28 MED ORDER — ASPIRIN EC 81 MG PO TBEC
81.0000 mg | DELAYED_RELEASE_TABLET | Freq: Every day | ORAL | Status: DC
  Administered 2012-09-28 – 2012-10-03 (×6): 81 mg via ORAL
  Filled 2012-09-28 (×6): qty 1

## 2012-09-28 MED ORDER — PIPERACILLIN-TAZOBACTAM 3.375 G IVPB
3.3750 g | Freq: Three times a day (TID) | INTRAVENOUS | Status: DC
  Administered 2012-09-28 – 2012-09-29 (×3): 3.375 g via INTRAVENOUS
  Filled 2012-09-28 (×6): qty 50

## 2012-09-28 MED ORDER — VANCOMYCIN HCL IN DEXTROSE 1-5 GM/200ML-% IV SOLN
1000.0000 mg | INTRAVENOUS | Status: DC
  Administered 2012-09-29: 1000 mg via INTRAVENOUS
  Filled 2012-09-28: qty 200

## 2012-09-28 MED ORDER — HEPARIN SODIUM (PORCINE) 5000 UNIT/ML IJ SOLN
5000.0000 [IU] | Freq: Three times a day (TID) | INTRAMUSCULAR | Status: DC
  Administered 2012-09-28 – 2012-09-29 (×4): 5000 [IU] via SUBCUTANEOUS
  Filled 2012-09-28 (×7): qty 1

## 2012-09-28 MED ORDER — INSULIN GLARGINE 100 UNIT/ML ~~LOC~~ SOLN
20.0000 [IU] | Freq: Every day | SUBCUTANEOUS | Status: DC
  Administered 2012-09-28: 20 [IU] via SUBCUTANEOUS

## 2012-09-28 MED ORDER — ASPIRIN 300 MG RE SUPP
150.0000 mg | Freq: Every day | RECTAL | Status: DC
  Filled 2012-09-28: qty 1

## 2012-09-28 MED ORDER — ASPIRIN EC 81 MG PO TBEC: 81.0000 mg | DELAYED_RELEASE_TABLET | Freq: Every day | ORAL | Status: DC

## 2012-09-28 MED ORDER — WARFARIN SODIUM 4 MG PO TABS
4.0000 mg | ORAL_TABLET | Freq: Once | ORAL | Status: DC
  Filled 2012-09-28: qty 1

## 2012-09-28 MED ORDER — DABIGATRAN ETEXILATE MESYLATE 150 MG PO CAPS: 150.0000 mg | ORAL_CAPSULE | Freq: Two times a day (BID) | ORAL | Status: DC

## 2012-09-28 MED ORDER — DIPHENHYDRAMINE HCL 50 MG/ML IJ SOLN
25.0000 mg | Freq: Once | INTRAMUSCULAR | Status: AC | PRN
  Filled 2012-09-28: qty 1

## 2012-09-28 MED ORDER — WARFARIN - PHARMACIST DOSING INPATIENT: Freq: Every day | Status: DC

## 2012-09-28 NOTE — Progress Notes (Signed)
Stroke Team Progress Note  HISTORY Aaron [name] is an 78 y.o. male awoke 09/25/2012 am with right-sided weakness and aphasia. He had been seen by his caregiver at 6 AM when she checked his glucose. At that time, he was able to talk and move his right side well. At 8 AM when she rechecked on him, he was not moving his right side or talking. She called 911.   On arrival here, he had some improvement with strength in his right arm. Over the course of my examination, he began speaking. Of note, he was recently found to be subtherapeutic on his Coumadin, and therefore it was held for 4 days. He was restarted on Sunday.  Patient was not a TPA candidate secondary to presenting outside the window. He was admitted to for further evaluation and treatment.  SUBJECTIVE  Patient has shown improvement in his right hemiparesis, speech and swallowing. He just returned from his modified barium swallow and is tired and sleepy.   OBJECTIVE Most recent Vital Signs: Filed Vitals:   09/27/12 1800 09/27/12 2200 09/28/12 0200 09/28/12 0600  BP: 140/60 106/59 149/68 122/78  Pulse: 95 95 95 108  Temp: 98 F (36.7 C) 98.3 F (36.8 C) 97.6 F (36.4 C) 98 F (36.7 C)  TempSrc: Oral Oral Oral Oral  Resp: 18 18 18 18   Height:      Weight:      SpO2: 95% 96% 95% 94%   CBG (last 3)   Basename 09/28/12 0807 09/28/12 0418 09/27/12 2327  GLUCAP 103* 87 93   Intake/Output from previous day: 10/10 0701 - 10/11 0700 In: -  Out: 300 [Urine:300]  IV Fluid Intake:      . sodium chloride 75 mL/hr at 09/28/12 0748    MEDICATIONS     . aspirin  150 mg Rectal Daily  . atorvastatin  80 mg Oral q1800  . clindamycin (CLEOCIN) IV  600 mg Intravenous Q8H  . insulin aspart  0-20 Units Subcutaneous TID WC  . insulin glargine  5 Units Subcutaneous Once  . pantoprazole (PROTONIX) IV  40 mg Intravenous QHS  . sodium chloride  500 mL Intravenous Once  . sodium chloride  500 mL Intravenous Once  . DISCONTD:  dabigatran  150 mg Oral Q12H  . DISCONTD: DULoxetine  30 mg Oral QHS  . DISCONTD: fentaNYL  50 mcg Transdermal Q72H  . DISCONTD: insulin aspart  0-15 Units Subcutaneous TID WC  . DISCONTD: insulin aspart  0-9 Units Subcutaneous TID WC  . DISCONTD: insulin glargine  15 Units Subcutaneous Daily  . DISCONTD: insulin glargine  5 Units Subcutaneous Daily  . DISCONTD: pantoprazole  40 mg Oral Q1200  . DISCONTD: vancomycin  1,000 mg Intravenous Q48H   PRN:  acetaminophen, bisacodyl, ondansetron (ZOFRAN) IV, DISCONTD: ketorolac  Diet:  NPO Activity:  Bedrest DVT Prophylaxis:  Heparin 5000 units SQ Q 8 hours + SCD  CLINICALLY SIGNIFICANT STUDIES Basic Metabolic Panel:   Lab 09/28/12 0650 09/27/12 1820 09/26/12 0625  NA 139 138 --  K 4.4 4.6 --  CL 103 102 --  CO2 20 22 --  GLUCOSE 121* 167* --  BUN 41* 44* --  CREATININE 2.16* 2.70* --  CALCIUM 9.0 9.3 --  MG -- -- 2.2  PHOS -- -- --   Liver Function Tests:   Lab 09/25/12 0945  AST 21  ALT 10  ALKPHOS 96  BILITOT 0.4  PROT 7.2  ALBUMIN 2.9*   CBC:   Lab 09/28/12 0650  09/27/12 1142 09/25/12 0945  WBC 26.5* 22.4* --  NEUTROABS -- -- 8.4*  HGB 11.6* 10.7* --  HCT 36.3* 34.3* --  MCV 89.4 90.3 --  PLT 367 394 --   Coagulation:   Lab 09/25/12 0930  LABPROT 16.4*  INR 1.35   Cardiac Enzymes:   Lab 09/25/12 0945  CKTOTAL --  CKMB --  CKMBINDEX --  TROPONINI <0.30   Urinalysis:   Lab 09/27/12 1836  COLORURINE AMBER*  LABSPEC 1.020  PHURINE 5.0  GLUCOSEU 100*  HGBUR NEGATIVE  BILIRUBINUR LARGE*  KETONESUR 15*  PROTEINUR 100*  UROBILINOGEN 1.0  NITRITE NEGATIVE  LEUKOCYTESUR NEGATIVE   Lipid Panel    Component Value Date/Time   CHOL 132 09/26/2012 0625   TRIG 135 09/26/2012 0625   HDL 31* 09/26/2012 0625   CHOLHDL 4.3 09/26/2012 0625   VLDL 27 09/26/2012 0625   LDLCALC 74 09/26/2012 0625   HgbA1C  Lab Results  Component Value Date   HGBA1C 8.0* 09/25/2012    CT of the brain  09/25/2012  No  intracranial hemorrhage.  Small vessel disease type changes.  Remote right occipital lobe and right cerebellar infarcts suspected without CT evidence of large acute infarct.    CT Head Wo Contrast 09/27/2012 The posterior left frontal lobe acute infarct appears larger than on the prior MR. There may have been interval extension of this infarct (as versus result of edema from expected evolution of the acute infarct causing the CT appearance). No associated hemorrhage.  Ct Angio Head 09/25/2012   1.  High-grade stenosis within the cavernous left internal carotid artery. 2.  Moderate supraclinoid internal carotid artery stenoses bilaterally. 3.  Mild to moderate irregularity of the proximal A1 and M1 segments bilaterally without significant stenosis. 4.  Moderate small vessel disease. 5.  Apart from the cavernous and supraclinoid left internal carotid artery stenoses, no other focal lesion is evident to explain the patient's symptoms.    Dg Lumbar Spine Complete 09/25/2012   Cement vertebral augmentation at T11, T12, and L1.  Compression fractures of L3 and L4 of indeterminate age.  2 cm partial staghorn calculus in the right renal pelvis.     CT Angio Neck 09/25/2012   1.  High-grade stenosis of the right vertebral artery at its origin with tandem stenoses at C6-7, C5-6, and at the dural margin.  The distal vertebral artery is essentially occluded. 2.  Mild narrowing of the proximal left vertebral artery, dominant vessel without other significant stenoses through the neck. 3.  50% stenosis of the proximal left internal carotid artery. 4.  Atherosclerotic calcifications of the proximal right internal carotid artery without significant stenosis. 5.  Tortuosity of the internal carotid arteries bilaterally as well as the left vertebral artery, likely secondary to hypertension. 6.  Moderate spondylosis of the cervical spine.   MRI of the brain  09/25/2012 1.  Acute non hemorrhagic infarct within the posterior left  frontal lobe, along the central sulcus measures less than 2 cm. 2.  Atrophy and mild diffuse white matter disease likely reflects the sequelae of chronic microvascular ischemia. 3.  Remote lacunar infarcts of the cerebellum bilaterally. 4.  Acute left maxillary sinus disease.  MRA of the brain  See CTA head  2D Echocardiogram  EF 65-70% with no source of embolus. A bioprosthesis was present  Carotid Doppler  See CTA neck  EKG  atrial fibrillation, rate 92.   Therapy Recommendations PT - CIR ; OT - CIR   Physical Exam  Elderly Caucasian male currently not in distress.Awake alert. Afebrile. Head is nontraumatic. Neck is supple without bruit. Hearing is normal. Cardiac exam no murmur or gallop. Lungs are clear to auscultation. Distal pulses are well felt except Left leg below-knee amputation with bandage. Neurological Exam : awake alert and moderate expressive aphasia with speaking only a few words some occasional short sentences and difficult to understand. Follows midline and one step commands only. Extraocular moments are full range without nystagmus. Fundi were not visualized. Diminished blink to threat on the right compared to the left. Mild right lower facial weakness. Tongue midline. Mild hemiplegia with 3/5 strength in the right upper extremity and 3/5 in right lower extremity. Good antigravity strength in the left. Coordination sensation cannot be reliably tested. Gait was not tested.  ASSESSMENT Aaron [name] is a 78 y.o. male presenting with right hemiparesis and aphasia. Imaging confirms a  posterior left frontal lobe infarct. Infarct felt to be embolic secondary to known atrial fibrillation.  On warfarin prior to admission, though INR subtherapeutic on arrival at 1.35 after coumadin being held x 4 days, resumed on Sunday prior to admission. Now on rectal Aspirin 150mg  daily for secondary stroke prevention. With  neuro worsening, dysarthria, right arm hemiparesis and dysphagia and  extension of left frontal lobe stroke on head CT, the patient has been made NPO for official DG swallowing functional study today.   Porcine valve atrial fibrillation, heart rate up to 165 during the night Hypertension Diabetes Mellitus type 2, uncontrolled, HgbA1c 8.0 CAD PVD, L BKA hx LDL 74 Acute kidney failure, felt to be due to obstruction--foley cath with u/a, renal ultrasound shows no calculi  Hospital day # 3  TREATMENT/PLAN  Patient has returned from swallow study and will be able to start eating with modified diet, to be ordered. Meds will be able to be crushed per specialist.  Recommend restart Pradaxa 75mg  twice daily (due to renal function) with baby aspirin 81mg  daily (this due to PVD/PAD). Stop rectal aspirin 150mg  daily.  Aggressive glycemic control  Statin therapy, lipitor 80mg  daily  Recommend CIR consult  Recommend out of bed with assist only   Guy Franco, [facility],  MBA, MHA [facility] Pager: [phone] 09/28/2012 8:22 AM  Scribe for Dr. Delia Heady, Stroke Center Medical Director. He has personally reviewed chart, pertinent data, examined the patient and developed the plan of care. Pager:  [phone]

## 2012-09-28 NOTE — Progress Notes (Signed)
ANTICOAGULATION CONSULT NOTE - Initial Consult  Pharmacy Consult for coumadin Indication: atrial fibrillation, CVA  Allergies  Allergen Reactions  . Metformin And Related Hives  . Penicillins Hives and Swelling    Patient Measurements: Height: 5\' 8"  (172.7 cm) Weight: 151 lb 14.4 oz (68.9 kg) IBW/kg (Calculated) : 68.4    Vital Signs: Temp: 98.1 F (36.7 C) (10/11 1042) Temp src: Oral (10/11 1042) BP: 136/66 mmHg (10/11 1042) Pulse Rate: 110  (10/11 1042)  Labs:  Basename 09/28/12 0650 09/27/12 1820 09/27/12 1536 09/27/12 1142  HGB 11.6* -- -- 10.7*  HCT 36.3* -- -- 34.3*  PLT 367 -- -- 394  APTT -- -- -- --  LABPROT -- -- -- --  INR -- -- -- --  HEPARINUNFRC -- -- -- --  CREATININE 2.16* 2.70* 2.69* --  CKTOTAL -- -- -- --  CKMB -- -- -- --  TROPONINI -- -- -- --    Estimated Creatinine Clearance: 23.8 ml/min (by C-G formula based on Cr of 2.16).   Medical History: Past Medical History  Diagnosis Date  . Hypertension   . Diabetes mellitus   . CHF (congestive heart failure)   . Coronary artery disease   . Atrial fibrillation   . PVD (peripheral vascular disease)   . S/P CABG x 1 2008  . S/P aortic valve replacement with porcine valve 2008    Medications:  Prescriptions prior to admission  Medication Sig Dispense Refill  . amLODipine (NORVASC) 5 MG tablet Take 5 mg by mouth 2 (two) times daily.      . Calcium Carbonate-Vitamin D (CALCIUM 600+D) 600-400 MG-UNIT per tablet Take 1 tablet by mouth daily.      . Cholecalciferol (VITAMIN D-3) 1000 UNITS CAPS Take 1,000 Units by mouth at bedtime.      . DULoxetine (CYMBALTA) 30 MG capsule Take 30 mg by mouth at bedtime.       Marland Kitchen HYDROCODONE-ACETAMINOPHEN PO Take 1-2 tablets by mouth every 6 (six) hours as needed. For pain      . insulin aspart (NOVOLOG) 100 UNIT/ML injection Inject 3-6 Units into the skin 3 (three) times daily before meals. 6 units every morning, 3 units with lunch and 5 units with dinner        . losartan (COZAAR) 100 MG tablet Take 50 mg by mouth 2 (two) times daily.      Marland Kitchen losartan (COZAAR) 50 MG tablet Take 50 mg by mouth 2 (two) times daily.      . metoprolol (LOPRESSOR) 50 MG tablet Take 75 mg by mouth 2 (two) times daily.      Marland Kitchen nystatin (MYCOSTATIN/NYSTOP) 100000 UNIT/GM POWD Apply topically 2 (two) times daily as needed. Apply to buttocks for rash as needed      . simvastatin (ZOCOR) 40 MG tablet Take 40 mg by mouth every evening.      . torsemide (DEMADEX) 10 MG tablet Take 10 mg by mouth 2 (two) times daily.       Marland Kitchen warfarin (COUMADIN) 2.5 MG tablet Take 2.5 mg by mouth daily.        Assessment: 78 yo man with h/o afib to resume coumadin.  INR on 10/8 was 1.35. He was on coumadin PTA then changed to pradaxa 150 mg q12 hours with last dose 10/9 ~2200.  He will also be on sq heparin until INR >2.0 Goal of Therapy:  INR 2-3 Monitor platelets by anticoagulation protocol: Yes   Plan:  Coumadin 4 mg today. Daily  protimes. F/u S&S bleeding.   Talbert Cage Poteet 09/28/2012,12:35 PM

## 2012-09-28 NOTE — Progress Notes (Signed)
Inpatient Diabetes Program Recommendations  AACE/ADA: New Consensus Statement on Inpatient Glycemic Control (2013)  Target Ranges:  Prepandial:   less than 140 mg/dL      Peak postprandial:   less than 180 mg/dL (1-2 hours)      Critically ill patients:  140 - 180 mg/dL   Results for AKSHAT, MINEHART (MRN [id number]) as of 09/28/2012 14:16  Ref. Range 09/27/2012 07:55 09/27/2012 08:56 09/27/2012 09:57 09/27/2012 10:46 09/27/2012 12:48 09/27/2012 16:07 09/27/2012 19:10 09/27/2012 20:08 09/27/2012 23:27  Glucose-Capillary Latest Range: 70-99 mg/dL 284 (H) 132 (H) 440 (H) 469 (H) 341 (H) 215 (H) 126 (H) 97 93    Results for ANNE, SEBRING (MRN [id number]) as of 09/28/2012 14:16  Ref. Range 09/28/2012 04:18 09/28/2012 08:07 09/28/2012 11:38  Glucose-Capillary Latest Range: 70-99 mg/dL 87 440 (H) 347 (H)    Inpatient Diabetes Program Recommendations Insulin - Basal: Patient received Lantus 20 units (total) on 09/27/12.  Lantus was discontinued today at 6:46am.  Noon CBG 188.  Since Lantus was discontinued this am and it appears that CBGs are increasing.  Please consider restarting Lantus 10 units daily.  Note: Patient received Lantus 20 units total on 09/27/12.  Lantus was discontinued today at 6:46am.  CBG at noon is 188 mg/dl. Please consider restarting Lantus 10 units daily since CBGs are beginning to increase.  Will continue to follow. Orlando Penner, RN, BSN, CCRN Diabetes Coordinator

## 2012-09-28 NOTE — Progress Notes (Signed)
novolog insulin 11 units given with meal at 1720. Pt and med scanned but not showing in epic. Rn is aware

## 2012-09-28 NOTE — Progress Notes (Signed)
Occupational Therapy Treatment Patient Details Name: Aaron [name] MRN: [id number] DOB: 12-May-1924 Today's Date: 09/28/2012 Time: 4098-1191 OT Time Calculation (min): 23 min  OT Assessment / Plan / Recommendation Comments on Treatment Session This 78 yo male not making as much progress today due to a setback yesterday. Will continue to benefit from acute OT with follow up OT at CIR.    Follow Up Recommendations  Inpatient Rehab       Equipment Recommendations  None recommended by OT;None recommended by PT       Frequency Min 3X/week   Plan Discharge plan remains appropriate    Precautions / Restrictions Precautions Precautions: Fall Precaution Comments: LLE BKA--fairly new Restrictions Weight Bearing Restrictions: No Other Position/Activity Restrictions: LLE BKA       ADL  Lower Body Dressing: Performed (able to maintain sitting balance with min A while A with thi) Where Assessed - Lower Body Dressing: Supported standing Toilet Transfer: Simulated;+2 Total assistance Toilet Transfer: Patient Percentage: 60% Toilet Transfer Method: Squat pivot (going to pts right) Acupuncturist:  (bed to recliner) Transfers/Ambulation Related to ADLs: +2 total A (pt=60%) squat pivot to right side      OT Goals ADL Goals ADL Goal: Toilet Transfer - Progress: Not progressing (setback yesterday) Arm Goals Arm Goal: Additional Goal #1 - Progress: Progressing toward goals Miscellaneous OT Goals OT Goal: Miscellaneous Goal #1 - Progress: Progressing toward goals OT Goal: Miscellaneous Goal #2 - Progress: Progressing toward goals  Visit Information  Last OT Received On: 09/28/12 Assistance Needed: +2 PT/OT Co-Evaluation/Treatment: Yes    Subjective Data  Subjective: He sang a song for [name], but hard to understand due to dysarthria      Cognition  Overall Cognitive Status: Appears within functional limits for tasks assessed/performed Arousal/Alertness:  Awake/alert Orientation Level: Appears intact for tasks assessed Behavior During Session: [name] for tasks performed Cognition - Other Comments: frustrated at times especially since he cannot use his RUE as he wants to    Mobility   Bed Mobility Bed Mobility: Rolling Left Rolling Left: 1: +2 Total assist Rolling Left: Patient Percentage: 70% Supine to Sit: 4: Min assist Details for Bed Mobility Assistance: facilitation and verbal sequencing cues for trunk rotation, min-modA verbal and tactile cues to sequence sidelying->sit Transfers Details for Transfer Assistance: squat pivot to the right with facilitation at hips to bring weight forward over his foot and pivot on RLE, pt upset that he couldn't reach and grab arm rest with RUE 2/2 weakness so potentially try pivoting to left next session       Exercises  Other Exercises Other Exercises: Pt able to push RUE forward at shoulder and then pull back without difficulty as long as weight of arm is supported. Pt able to extend at elbow without difficulty but with decreased control. Pt able to flex at elbow with increased effort and time. Pt able to supinate/pronate foearm with increased effort and time. No active movement noted at wrist or digits. Other Exercises: Instructed daughter in shoulder flex/ext, elbow flex/ext, forearm supination/pronation for her to do with pt over the weekend. Handouts given.   Balance Static Sitting Balance Static Sitting - Level of Assistance: 5: Stand by assistance;4: Min assist Static Sitting - Comment/# of Minutes: min tactile cues for tall posture (very slumped posteriorly rotated pelvis with kyphosis)   End of Session OT - End of Session Equipment Utilized During Treatment: Gait belt Activity Tolerance: Patient tolerated treatment well Patient left: in chair;with family/visitor present (daughter) Nurse  Communication: Mobility status (+2 A squat pivot)       [name] [phone] 09/28/2012,  1:38 PM

## 2012-09-28 NOTE — Progress Notes (Signed)
Rehab admissions - I spoke with family at bedside.  Family reports patient is doing better today.  I will follow progress over weekend and check back on Monday for possible medical readiness for inpatient rehab admission.  Call me for questions.  #[phone]

## 2012-09-28 NOTE — Progress Notes (Signed)
Medical Student Daily Progress Note   Subjective:    Interval Events:  Patient reports that he feels better this morning.  Yesterday his creatinine increased acutely from 0.9-->2.4 so a foley was placed due to concern for obstruction.  Foley d/c'd this am.  Neurologic function is better today than yesterday.  Patient denies SOB, chest pain, diarrhea, N/V.        Objective:    Vital Signs:   Temp:  [97.6 F (36.4 C)-98.3 F (36.8 C)] 98.1 F (36.7 C) (10/11 1042) Pulse Rate:  [95-110] 110  (10/11 1042) Resp:  [18] 18  (10/11 1042) BP: (106-149)/(59-78) 136/66 mmHg (10/11 1042) SpO2:  [94 %-96 %] 96 % (10/11 1042) Last BM Date: 09/26/12   Weights: 24-hour Weight change:   Filed Weights   09/26/12 0321 09/27/12 0900  Weight: 67.2 kg (148 lb 2.4 oz) 68.9 kg (151 lb 14.4 oz)     Intake/Output:   Intake/Output Summary (Last 24 hours) at 09/28/12 1315 Last data filed at 09/28/12 1100  Gross per 24 hour  Intake      0 ml  Output    400 ml  Net   -400 ml       Physical Exam: Vitals: T 98 P 108 R 18 BP 122/78 SpO2 94%  General: Pleasant, lying in bed in NAD.  Skin: Bruising on dorsum of hands. Darkening of skin on R LE.  Head: Normocephalic, atraumatic. Right-sided mouth droop.  Eyes: PERRLA. EOMI. No scleral icterus.  Ears: Normal gross hearing bilaterally with hearing aids in place.  CV: Regular rate, irregular rhythm. No m/r/g. No carotid bruits.  Resp: Normal work of breathing. Mild basilar crackles bilaterally.  Abd: Soft, non-tender, non-distended. Ext: Left peripheral IV is non-erythematous. Right DP and PT pulse 2+.  Radial pulses 2+ bilaterally.  Left BKA stump is c/d/non-erythematous and non-tender. Skin: No bedsores noted.     Neuro: Right-sided mouth/lower facial weakness improved from yesterday. CN II-XII otherwise intact bilaterally. RUE 4/5 strength (improved from yesterday) and R hand 0/5 strength. LUE 5/5 strength. RLE and LLE 5/5 strength. Sensation  normal and equal bilaterally. Abnormal finger-to-nose and RAM on the right secondary to weakness. Normal FTN and RAM on the left. 2+ L brachial reflex; not able to elicit a R brachial reflex. 2+ patellar reflex bilaterally.     Labs: Basic Metabolic Panel:  Lab 09/28/12 9604 09/27/12 1820 09/27/12 1536 09/27/12 1142 09/27/12 0840 09/26/12 0625  NA 139 138 139 -- 135 135  K 4.4 4.6 4.8 -- 5.4* 4.4  CL 103 102 102 -- 92* 98  CO2 20 22 23  -- 11* 22  GLUCOSE 121* 167* 256* 436* 527* --  BUN 41* 44* 43* -- 40* 15  CREATININE 2.16* 2.70* 2.69* -- 2.44* 0.91  CALCIUM 9.0 9.3 9.1 -- -- --  MG -- -- -- -- -- 2.2  PHOS -- -- -- -- -- --    Liver Function Tests:  Lab 09/25/12 0945  AST 21  ALT 10  ALKPHOS 96  BILITOT 0.4  PROT 7.2  ALBUMIN 2.9*   CBC:  Lab 09/28/12 0650 09/27/12 1142 09/25/12 1122 09/25/12 1001 09/25/12 0945  WBC 26.5* 22.4* -- -- 11.7*  NEUTROABS -- -- -- -- 8.4*  HGB 11.6* 10.7* 13.9 13.6 12.4*  HCT 36.3* 34.3* 41.0 40.0 38.6*  MCV 89.4 90.3 -- -- 89.4  PLT 367 394 -- -- 360    Cardiac Enzymes:  Lab 09/25/12 0945  CKTOTAL --  CKMB --  CKMBINDEX --  TROPONINI <0.30    CBG:  Lab 09/28/12 1138 09/28/12 0807 09/28/12 0418 09/27/12 2327 09/27/12 2008  GLUCAP 188* 103* 87 93 97    Microbiology: Results for orders placed during the hospital encounter of 09/25/12  CULTURE, BLOOD (ROUTINE X 2)     Status: Normal (Preliminary result)   Collection Time   09/27/12 11:45 AM      Component Value Range Status Comment   Specimen Description BLOOD RIGHT ARM   Final    Special Requests BOTTLES DRAWN AEROBIC AND ANAEROBIC 10CC   Final    Culture  Setup Time 09/27/2012 19:56   Final    Culture     Final    Value:        BLOOD CULTURE RECEIVED NO GROWTH TO DATE CULTURE WILL BE HELD FOR 5 DAYS BEFORE ISSUING A FINAL NEGATIVE REPORT   Report Status PENDING   Incomplete   CULTURE, BLOOD (ROUTINE X 2)     Status: Normal (Preliminary result)   Collection Time    09/27/12 11:50 AM      Component Value Range Status Comment   Specimen Description BLOOD LEFT HAND   Final    Special Requests BOTTLES DRAWN AEROBIC AND ANAEROBIC 10CC   Final    Culture  Setup Time 09/27/2012 19:56   Final    Culture     Final    Value:        BLOOD CULTURE RECEIVED NO GROWTH TO DATE CULTURE WILL BE HELD FOR 5 DAYS BEFORE ISSUING A FINAL NEGATIVE REPORT   Report Status PENDING   Incomplete     Other results: EKG Results 09/26/12:  Rate: 91  PR: --  QRS: 124 ms  QTc: 504 ms  EKG: Atrial fibrillation, RBBB  FeNa 0.1%  Imaging: Dg Chest 2 View  09/27/2012  *RADIOLOGY REPORT*  Clinical Data: Rule out aspiration.  Stroke  CHEST - 2 VIEW  Comparison: None.  Findings: Prior CABG.  Aortic valve replacement.  Cement vertebral augmentation in the lumbar spine at three levels.  Mild bibasilar airspace disease with the appearance of atelectasis. No definite pneumonia or effusion.  Negative for heart failure.  IMPRESSION: Mild bibasilar airspace disease, most likely atelectasis.   Original Report Authenticated By: Camelia Phenes, M.D.    Ct Head Wo Contrast  09/27/2012  *RADIOLOGY REPORT*  Clinical Data: Right-sided weakness worsening this morning. Diabetic hypertensive.  Atrial fibrillation.  CT HEAD WITHOUT CONTRAST  Technique:  Contiguous axial images were obtained from the base of the skull through the vertex without contrast.  Comparison: 09/25/2012 MR and CT.  Findings: The posterior left frontal lobe acute infarct (motor strip) appears larger than on the prior MR.  There may have been interval extension of this infarct (as versus result of edema from expected evolution of the acute infarct causing the CT appearance). No associated hemorrhage.  No new findings otherwise noted.  Small vessel disease type changes.  Global atrophy without hydrocephalus.  Remote small cerebellar infarcts. No intracranial mass lesion detected on this unenhanced exam.  Vascular calcifications.  Mucosal  thickening left maxillary sinus.  IMPRESSION:  The posterior left frontal lobe acute infarct appears larger than on the prior MR.  There may have been interval extension of this infarct (as versus result of edema from expected evolution of the acute infarct causing the CT appearance).  No associated hemorrhage.  This has been made a PRA call report utilizing dashboard call feature.   Original  Report Authenticated By: Fuller Canada, M.D.    US Renal  09/27/2012  *RADIOLOGY REPORT*  Clinical Data: Acute renal failure  RENAL/URINARY TRACT ULTRASOUND COMPLETE  Comparison:  Lumbar spine radiographs 09/25/2012  Findings:  Right Kidney:  10.2 cm. No hydronephrosis or focal mass.  Mildly suboptimal visualization due to adjacent bowel gas and acoustic window.  Allowing for this, no echogenic shadowing calculus is identified within the right renal pelvis.  Left Kidney:  Not visualized due to bowel gas.  Bladder:  Bladder is decompressed with a Foley catheter in place.  IMPRESSION: The right kidney is within normal limits in appearance and no echogenic shadowing calculi identified.  It is possible this obscured by adjacent bowel or could have been passed into non visualized portion of the ureter.  No other abnormality is identified to explain the history of acute renal failure, but the left kidney is not visualized.  If there is strong clinical suspicion for renal calculus or other complication given findings on recent lumbar spine radiographs, consider CT abdomen/pelvis for further evaluation.   Original Report Authenticated By: Harrel Lemon, M.D.       Medications:    Infusions:    . sodium chloride 75 mL/hr at 09/28/12 0748     Scheduled Medications:    . aspirin  150 mg Rectal Daily  . atorvastatin  80 mg Oral q1800  . heparin subcutaneous  5,000 Units Subcutaneous Q8H  . insulin aspart  0-20 Units Subcutaneous TID WC  . insulin glargine  5 Units Subcutaneous Once  . pantoprazole (PROTONIX)  IV  40 mg Intravenous QHS  . piperacillin-tazobactam (ZOSYN)  IV  3.375 g Intravenous Q8H  . sodium chloride  500 mL Intravenous Once  . vancomycin  1,000 mg Intravenous Q48H  . warfarin  4 mg Oral ONCE-1800  . Warfarin - Pharmacist Dosing Inpatient   Does not apply q1800  . DISCONTD: aspirin EC  81 mg Oral Daily  . DISCONTD: aspirin  150 mg Rectal Daily  . DISCONTD: clindamycin (CLEOCIN) IV  600 mg Intravenous Q8H  . DISCONTD: dabigatran  150 mg Oral Q12H  . DISCONTD: insulin aspart  0-15 Units Subcutaneous TID WC  . DISCONTD: insulin glargine  15 Units Subcutaneous Daily  . DISCONTD: insulin glargine  5 Units Subcutaneous Daily  . DISCONTD: vancomycin  1,000 mg Intravenous Q48H     PRN Medications: acetaminophen, bisacodyl, diphenhydrAMINE, ondansetron (ZOFRAN) IV   Assessment/ Plan:    Mr. Saiki is an 78 yo gentleman with multiple comorbidities, including HTN, DM, CHF, CAD, a fib, PVD, and L BKA who presents with right-sided weakness and difficulty speaking secondary to an acute ischemic stroke and AKI.   #Ischemic stroke  Patient is still experiencing right-sided UE weakness (better than yesterday) and aphasia/dysarthria (better than yesterday) secondary to an ischemic stroke in the left MCA territory that was confirmed by brain MRI. He is more alert than yesterday. Due to worsening neuro exam yesterday, patient went for a stat head CT that was negative for hemorrhage but shows that the posterior left frontal lobe acute infarct appears larger than on the prior MR. This could be due to extension of the infarct or edema.  We d/c'd his fentanyl patch yesterday, which could have been contributing to his decreased mental status.  CTA of head and neck reveal high-grade stenosis in the cavernous left internal carotid artery. Patient initially presented with RUE paralysis and complete expressive aphasia and/or dysarthria. His symptoms were improving until yesterday  when his neurological  status declined to 2/5 strength in the RUE and hand.  However, his strength and speech are again improving today. His speech deficit seems to be secondary to dysarthria rather than true expressive aphasia. He has no trouble with comprehension. Patient is at high risk for an embolic or thrombotic stroke because he has chronic a fib and stopped his warfarin x 4 days, and he has CAD, hypertension, and DM. CHADS2 score is 6. 2D echo showed EF 65%, mild LVH, and calcified MV annulus. Neuro consulted, appreciate recs.   --Speech re-evaluated him today with a barium swallow.  Recommend dysphagia 2 (find chop); thin liquid diet --Frequent neuro checks q 4 hrs  --ASA 150 mg suppository once daily --Holding pradaxa. Per neuro and pharmacy, pradaxa 75 mg po BID for secondary stroke prevention. However, daughter (who is healthcare power of attorney) has decided that for now she prefers Warfarin for anticoagulation. She has discussed the risks and benefits with the primary team, neurologist, pharmacist, and her daughter-in-law (who is a pharmacy resident at [employer] So far he has received 2 doses of 150 mg (last one on 10/9 at 2200). --Warfarin --PM&R recommends inpatient rehabilitation. He will remain on our service for now. --2D Echocardiogram shows EF 65-70%, mild LVH, MV with calcified annulus, and a bioprosthetic aortic valve  --No need for Carotid dopplers given CTA neck already done.  --Risk factor modification  --Telemetry monitoring  --atorvastatin 80 mg po once daily.  --HgbA1c 8%  --fasting lipid panel: chol 132 TG 135 HDL 31 LDL 74 VLDL 27 CHOL/HDL ratio 4.3   #AKI Creatinine today 2.16.  Went from 0.9-->2.4-->2.7 within the past 2 days.  FeNa 0.1%. Per daughter the pt's baseline creatinine prior to amputation was 1.7-1.9.  A FeNa of <1% suggests a pre-renal cause, including hypoglycemia, CHF, renal arterial obstruction. Though the FeNa suggets that it is not intrinsic ARF, possible intrinsic  etiologies are vancomycin, PPIs, infection, and cholesterol emboli or post-renal.  Obstruction is also a possibility, though yesterday bladder scan showed 30 cc's post-void residual and renal US was negative for obstruction (though left kidney was not visualized due to bowel).  Per nephrology recs, foley was placed yesterday and vanc was d/c'd in setting of AKI.  However, patient's creatinine went from 0.9-->2.4 before the vanc was administered, making it unlikely that this caused the AKI.  Foley was removed this am and vanc was re-started.  U/A: spec grav 1.020 pH 5 glucose 100, large bili, ketone 15, protein 100, negative nitrite/LE/Hgb; hyaline and granular casts.  --f/u UCx.  Infection less likely because U/A was negative for nitrites and LE. --restarted Vanc as described above for empiric tx of aspiration pneumonia --large bili in urine is concerning for liver dysfunction. F/u LFTs --patient has many risk factors for an embolic event. Lactic acid 1.0, making occlusion less likely. F/u urine eosinophils. --d/c foley and I/O cath if postvoid bladder scan > 300 cc's or if patient unable to void  #Leukocytosis Differential includes pneumonia (aspiration v. HAP), UTI, physiologic stress from recent stroke, or other source of infection such as bed sore, stump infection.  Patient may have aspirated yesterday with his worsened right-sided facial weakness.  CXR 10/11 showed mild bibasilar airspace disease, most likely atelectasis.  Barium swallow shows moderate aspiration.  Patient was started on antibiotics on 10/10.  Clinically the patient does not show signs of infection: he is normotensive (136/66), afebrile, his BKA stump is clean and dry, his peripheral IV is  non-erythematous, and no bed sores were noted. --yesterday received 2 doses of clindamycin 600 mg and one dose of vancomycin 1000 mg.  Nephrology recommended d/c'ing vanc in setting of AKI, but looking back at the labs that patient's creatinine  increased to 2.4 before the vanc was started.   --Will re-start on vanc 1000 mg IV q48 hrs with pharmacy consult (next dose will be 10/12 at 1500) --d/c'd clindamycin today --start Zosyn 3.375 mg IV q8 hrs (nb-Patient reports h/o hives when he took Penicillin 60 years ago.  Will give benadryl prn hives/itching)  #Back pain  Patient c/o thoracic back pain on the left side that is the same as yesterday. Xray spine show compression fractures at L3 and L4 of indeterminate age whose location does not correlate with his pain. Patient had two falls last week, the most recent on Thursday when he slipped on a sock in the bathroom. He denies LOC or head trauma. On exam patient is tender to palpation on the left paravertebral region at lower thoracic/upper lumbar spine.  --d/c'd fentanyl patch 50 mcg in setting of change in mental status  --d/c'd ketorolac 30 mg IV Q6hrs prn pain in setting of change in mental status   #Hypertension  BPs have been 100-140s over 50-60s. Home regimen: amlodipine 5 mg po BID, losartan 50 mg po BID, metoprolol 75 mg po BID  --Will not treat hypertension at this time in the setting of acute ischemic stroke.   #Diabetes mellitus  Most recent CBG 103. Received 20 U lantus yesterday. --holding lantus for now.  Might re-start on 20 U daily if glucose not well-controlled with SSI or if patient starts eating. --SSI 0-20 units. Goal CBG <200 in setting of ischemic stroke.   #Chronic A fib  --Holding pradaxa for now. Per neuro,started on pradaxa 150 mg BID on 10/9. He has received 2 doses (last one on 10/9 at 2200). If we do re-start pradaxa, pharmacy recommends 75 mg BID due to decreased GFR. For now, daughter (who is healthcare power of attorney) wants him re-started on warfarin instead.   #FEN/GI  --NPO now. F/u speech re-evaluation.  --IVF NS @ 75 mL/hr. D/C once patient has adequate po intake, especially with his h/o CHF.   #PPX  --start heparin 5,000U [place] --protonix IV 40  mg once daily   This is a Psychologist, occupational Note.  The care of the patient was discussed with Dr. Lavena Bullion and the assessment and plan formulated with their assistance.  Please see their attached note or addendum for official documentation of the daily encounter.

## 2012-09-28 NOTE — Progress Notes (Signed)
Physical Therapy Treatment Patient Details Name: Diarra Kos MRN: [id number] DOB: Oct 31, 1924 Today's Date: 09/28/2012 Time: 4098-1191 PT Time Calculation (min): 23 min  PT Assessment / Plan / Recommendation Comments on Treatment Session  Mr. Creque is 78 y/o male s/p CVA. Was evaluated by PT 10/9 with worsening symptoms noted 10/10 so pt put on bed rest with concern for possible extension of CVA. Able to transfer to chair today and expect pt to progress well. Continue to recommend CIR. All goals remain appropriate.     Follow Up Recommendations  Post acute inpatient     Does the patient have the potential to tolerate intense rehabilitation  Yes, Recommend IP Rehab Screening  Barriers to Discharge        Equipment Recommendations  None recommended by PT    Recommendations for Other Services Rehab consult  Frequency     Plan Discharge plan remains appropriate;Frequency remains appropriate    Precautions / Restrictions Precautions Precautions: Fall Restrictions Weight Bearing Restrictions: No Other Position/Activity Restrictions: LLE BKA       Mobility  Bed Mobility Bed Mobility: Rolling Left Rolling Left: 1: +2 Total assist Rolling Left: Patient Percentage: 70% Supine to Sit: 4: Min assist Details for Bed Mobility Assistance: facilitation and verbal sequencing cues for trunk rotation, min-modA verbal and tactile cues to sequence sidelying->sit Transfers Transfers: Heritage manager Transfers: 1: +2 Total assist Squat Pivot Transfers: Patient Percentage: 60% Details for Transfer Assistance: squat pivot to the right with facilitation at hips to bring weight forward over his foot and pivot on RLE, pt upset that he couldn't reach and grab arm rest with RUE 2/2 weakness so potentially try pivoting to left next session Ambulation/Gait Ambulation/Gait Assistance: Not tested (comment)      PT Goals Acute Rehab PT Goals PT Goal: Supine/Side to Sit -  Progress: Progressing toward goal PT Goal: Sit at Edge Of Bed - Progress: Progressing toward goal PT Goal: Sit to Stand - Progress: Progressing toward goal PT Goal: Stand to Sit - Progress: Progressing toward goal PT Transfer Goal: Bed to Chair/Chair to Bed - Progress: Progressing toward goal  Visit Information  Last PT Received On: 09/28/12 Assistance Needed: +2    Subjective Data  Subjective: My arm ain't worth a damn. Patient Stated Goal: to go to his civitan meeting   Cognition  Overall Cognitive Status: Appears within functional limits for tasks assessed/performed Arousal/Alertness: Awake/alert Orientation Level: Appears intact for tasks assessed Behavior During Session: [facility] for tasks performed Cognition - Other Comments: HOH, slightly agitated at times, seems to be starting to understand that his arm is not strong enough    Balance  Static Sitting Balance Static Sitting - Level of Assistance: 5: Stand by assistance;4: Min assist Static Sitting - Comment/# of Minutes: min tactile cues for tall posture (very slumped posteriorly rotated pelvis with kyphosis)  End of Session PT - End of Session Equipment Utilized During Treatment: Gait belt Activity Tolerance: Patient tolerated treatment well Patient left: in chair;with call bell/phone within reach Nurse Communication: Mobility status   GP     [facility] HELEN 09/28/2012, 1:14 PM

## 2012-09-28 NOTE — Progress Notes (Signed)
Speech Language Pathology Dysphagia Treatment Patient Details Name: Aaron [name] MRN: [id number] DOB: [birth year] Today's Date: 09/28/2012 Time: 9562-1308 SLP Time Calculation (min): 15 min  Assessment / Plan / Recommendation Clinical Impression   Purpose of treatment to provide education to caregivers regarding results of MBS administered this date and recommendations to reduce aspiration.  [name] demonstrated understanding of diet recommendations and swallow strategies. ST to follow on 09/29/12 for diet tolerance.                  Swallowing Goals  SLP Swallowing Goals Patient will consume recommended diet without observed clinical signs of aspiration with: Supervision/safety Patient will utilize recommended strategies during swallow to increase swallowing safety with: Supervision/safety  General Temperature Spikes Noted: No Respiratory Status: Room air Behavior/Cognition: Alert;Cooperative;Hard of hearing;Requires cueing Oral Cavity - Dentition: Poor condition;Missing dentition Patient Positioning: Upright in chair  Oral Cavity - Oral Hygiene Does patient have any of the following "at risk" factors?: Diet - patient on thickened liquids;Other - dysphagia Patient is HIGH RISK - Oral Care Protocol followed (see row info): Yes Patient is AT RISK - Oral Care Protocol followed (see row info): Yes        GO   Aaron Fowler MS, CCC-SLP [phone]  [facility] 09/28/2012, 12:09 PM

## 2012-09-28 NOTE — Procedures (Signed)
Objective Swallowing Evaluation: Modified Barium Swallowing Study  Patient Details  Name: Aaron [name] MRN: [id number] Date of Birth: 09-Sep-1924  Today's Date: 09/28/2012 Time: 4098-1191 SLP Time Calculation (min): 30 min  Past Medical History:  Past Medical History  Diagnosis Date  . Hypertension   . Diabetes mellitus   . CHF (congestive heart failure)   . Coronary artery disease   . Atrial fibrillation   . PVD (peripheral vascular disease)   . S/P CABG x 1 2008  . S/P aortic valve replacement with porcine valve 2008   Past Surgical History:  Past Surgical History  Procedure Date  . Below knee leg amputation 07/2012  . Cataract extraction w/ intraocular lens implant   . Abdominal surgery   . Hernia repair     x5  . Back surgery     vertebroplasty  . Coronary artery bypass graft 2008   HPI:  78 y/o male admitted to [facility] on 10/8 with right facial droop, difficulty with speech, and right-sided weakness.  MRI indicates acute non hemorrhagic infarct within posterior left frontal lobe and remote lacunar infarcts.  Initially, patient on dysphagia 3 diet consistency and thin liquids but diet downgrade made to dysphagia 2/nectar thick liquids on 10/10 due to increased s/s of aspiration .  CT of head completed on 10/10 reveals extension of posterior left front lobe inarct.  MBS indicated to objectively evaluate adequacy of airway protection.      Assessment / Plan / Recommendation Clinical Impression  Dysphagia Diagnosis: Moderate oral phase dysphagia;Moderate pharyngeal phase dysphagia;Moderate cervical esophageal phase dysphagia Clinical impression: Moderate sensory-motor oral and pharyngeal dysphagia.  Oral dysphagia marked by weakness and discoordination of tongue and reduction of labial and buccal tension.  Mastication inadequate  mainly due to lack of dentition.   Silent penetration occurred with nectar thick barium administered by spoon and cup before the swallow due to premature  pharyngeal spill and decreased airway/laryngeal closure.  Silent trace aspiraton occured during the swallow with thin liquid barium and nectar thick barium by cup due to reduced hyoid laryngeal elevation.  Moderate aspiration before and during swallow of thin liquid barium by straw with delayed throat clear and cough.  Strategy of chin tuck effective in eliminating penetration and aspiration.  Accumulation of residue in vallecular space occurred with trials of mechanical soft with patient expectorating residue to oral cavity during second swallow attempt.  Whole barium tablet lodged in vallecular space  with double swallow ineffective in clearing. Chin tuck required to clear and initate bolus transit. Patient appeared to have  large osteophyte at C-5 to C-6 .  Brief esophageal sweep indicates decreased bolus transit with mechanical soft trial and whole barium tablet with backflow into cervica esophagus.  Recommend to continue Dysphagia 2 consistency and upgrade to thin liquid with use of chin tuck.  Patient demonstrates abilty to complete strategy with minimal verbal cues.  Due to noted backflow during esophageal screen recommend reflux precautions to decrease risk of aspiration after the swallow.  Strategies recommended for reduction of aspiration small bolus size, upright posture, and chin tuck with sips full supervision with all meals to provide necessary cues.   ST to follow in acute care setting for diet tolerance and possible diet upgrade.      Treatment Recommendation  Therapy as outlined in treatment plan below    Diet Recommendation Dysphagia 2 (Fine chop);Thin liquid   Liquid Administration via: Cup;No straw Medication Administration: Crushed with puree Supervision: Full supervision/cueing for compensatory strategies Compensations:  Slow rate;Small sips/bites;Multiple dry swallows after each bite/sip;Clear throat intermittently Postural Changes and/or Swallow Maneuvers: Seated upright 90  degrees;Upright 30-60 min after meal;Chin tuck    Other  Recommendations Oral Care Recommendations: Oral care BID Other Recommendations: Clarify dietary restrictions   Follow Up Recommendations  Inpatient Rehab    Frequency and Duration min 2x/week  2 weeks       SLP Swallow Goals Patient will consume recommended diet without observed clinical signs of aspiration with: Supervision/safety Patient will utilize recommended strategies during swallow to increase swallowing safety with: Supervision/safety   General Date of Onset: 09/25/12 HPI: 78 y/o male admitted to [facility] on 10/8 with right facial droop, difficulty with speech, and right-sided weakness.  MRI indicates acute non hemorrhagic infarct within posterior left frontal lobe and remote lacunar infarcts.  Initially, patient on dysphagia 3 diet consistency and thin liquids but diet downgrade made to dysphagia 2/nectar thick liquids on 10/10 due to increased weakness .  CT of head completed on 10/10 reveas extension of posterior left front lobe inarct.  MBS indicated to objectively evaluate  Type of Study: Modified Barium Swallowing Study Reason for Referral: Objectively evaluate swallowing function Previous Swallow Assessment: BSE  Diet Prior to this Study: Dysphagia 2 (chopped);Nectar-thick liquids Temperature Spikes Noted: No Respiratory Status: Room air History of Recent Intubation: No Behavior/Cognition: Alert;Cooperative;Hard of hearing;Requires cueing Oral Cavity - Dentition: Poor condition;Missing dentition Oral Motor / Sensory Function: Impaired - see Bedside swallow eval Self-Feeding Abilities: Needs assist Patient Positioning: Upright in chair Baseline Vocal Quality: Hoarse Volitional Cough: Strong Volitional Swallow: Able to elicit Anatomy: Within functional limits Pharyngeal Secretions: Not observed secondary MBS    Reason for Referral Objectively evaluate swallowing function   Oral Phase Oral Preparation/Oral  Phase Oral Phase: Impaired Oral - Honey Oral - Honey Cup: Incomplete tongue to palate contact;Weak lingual manipulation;Reduced posterior propulsion;Delayed oral transit;Piecemeal swallowing Oral - Nectar Oral - Nectar Teaspoon: Incomplete tongue to palate contact;Reduced posterior propulsion;Weak lingual manipulation;Delayed oral transit;Lingual/palatal residue;Piecemeal swallowing Oral - Nectar Cup: Incomplete tongue to palate contact;Reduced posterior propulsion;Delayed oral transit;Lingual/palatal residue;Piecemeal swallowing;Weak lingual manipulation Oral - Thin Oral - Thin Teaspoon: Incomplete tongue to palate contact;Reduced posterior propulsion;Lingual/palatal residue;Piecemeal swallowing;Delayed oral transit Oral - Thin Cup: Weak lingual manipulation;Incomplete tongue to palate contact;Reduced posterior propulsion;Piecemeal swallowing;Lingual/palatal residue;Delayed oral transit Oral - Thin Straw: Incomplete tongue to palate contact;Weak lingual manipulation;Lingual/palatal residue;Delayed oral transit;Piecemeal swallowing Oral - Solids Oral - Puree: Delayed oral transit;Incomplete tongue to palate contact;Reduced posterior propulsion;Right pocketing in lateral sulci;Piecemeal swallowing;Lingual/palatal residue Oral - Mechanical Soft: Weak lingual manipulation;Impaired mastication;Piecemeal swallowing;Lingual/palatal residue;Incomplete tongue to palate contact;Reduced posterior propulsion;Delayed oral transit   Pharyngeal Phase Pharyngeal Phase Pharyngeal Phase: Impaired Pharyngeal - Honey Pharyngeal - Honey Cup: Reduced tongue base retraction;Reduced laryngeal elevation;Premature spillage to valleculae;Reduced pharyngeal peristalsis;Reduced airway/laryngeal closure;Pharyngeal residue - valleculae Pharyngeal - Nectar Pharyngeal - Nectar Teaspoon: Reduced pharyngeal peristalsis;Premature spillage to valleculae;Reduced anterior laryngeal mobility;Reduced laryngeal elevation;Reduced  airway/laryngeal closure;Reduced tongue base retraction;Penetration/Aspiration before swallow;Penetration/Aspiration during swallow;Trace aspiration;Pharyngeal residue - valleculae Penetration/Aspiration details (nectar teaspoon): Material enters airway, remains ABOVE vocal cords and not ejected out Pharyngeal - Nectar Cup: Reduced pharyngeal peristalsis;Reduced airway/laryngeal closure;Reduced tongue base retraction;Penetration/Aspiration before swallow;Reduced laryngeal elevation;Premature spillage to valleculae;Reduced anterior laryngeal mobility;Penetration/Aspiration during swallow Penetration/Aspiration details (nectar cup): Material enters airway, CONTACTS cords and not ejected out Pharyngeal - Thin Pharyngeal - Thin Teaspoon: Premature spillage to valleculae;Reduced pharyngeal peristalsis;Reduced anterior laryngeal mobility;Reduced laryngeal elevation;Reduced airway/laryngeal closure;Reduced tongue base retraction Pharyngeal - Thin Cup: Reduced pharyngeal peristalsis;Reduced airway/laryngeal closure;Reduced anterior laryngeal mobility;Premature spillage  to pyriform sinuses;Reduced laryngeal elevation;Reduced tongue base retraction;Penetration/Aspiration before swallow;Penetration/Aspiration during swallow;Trace aspiration Penetration/Aspiration details (thin cup): Material enters airway, passes BELOW cords without attempt by patient to eject out (silent aspiration);Material enters airway, remains ABOVE vocal cords and not ejected out Pharyngeal - Thin Straw: Reduced pharyngeal peristalsis;Reduced anterior laryngeal mobility;Reduced laryngeal elevation;Premature spillage to pyriform sinuses;Reduced tongue base retraction;Reduced airway/laryngeal closure;Penetration/Aspiration before swallow;Penetration/Aspiration during swallow;Moderate aspiration Penetration/Aspiration details (thin straw): Material enters airway, passes BELOW cords without attempt by patient to eject out (silent  aspiration) Pharyngeal - Solids Pharyngeal - Puree: Premature spillage to valleculae;Reduced airway/laryngeal closure;Reduced tongue base retraction;Penetration/Aspiration before swallow;Reduced anterior laryngeal mobility;Reduced laryngeal elevation;Pharyngeal residue - valleculae Pharyngeal - Mechanical Soft: Delayed swallow initiation;Premature spillage to valleculae;Reduced pharyngeal peristalsis;Reduced anterior laryngeal mobility;Reduced laryngeal elevation;Reduced airway/laryngeal closure;Reduced tongue base retraction;Pharyngeal residue - valleculae Pharyngeal - Pill: Reduced anterior laryngeal mobility;Premature spillage to valleculae;Reduced airway/laryngeal closure;Reduced pharyngeal peristalsis;Reduced tongue base retraction;Reduced laryngeal elevation Pharyngeal Phase - Comment Pharyngeal Comment: whole barium tablet lodged in valleculae, double swallow ineffective , chin tuck effective in initiating transit  Cervical Esophageal Phase    GO    Cervical Esophageal Phase Cervical Esophageal Phase: Impaired Cervical Esophageal Phase - Solids Mechanical Soft: Esophageal backflow into cervical esophagus Pill: Esophageal backflow into cervical esophagus        Moreen Fowler MS, CCC-SLP [phone] [facility] 09/28/2012, 11:35 AM

## 2012-09-28 NOTE — Progress Notes (Signed)
Resident Addendum to Medical Student Note   I have seen and examined the patient, and agree with the the medical student assessment and plan outlined above. Please see my brief note below for additional details.  S: Patient states he feels much better this AM. No new complaints this AM.   OBJECTIVE: VS: Reviewed  Meds: Reviewed  Labs: Reviewed  Imaging: Reviewed   Physical Exam: General: Vital signs reviewed and noted. Well-developed, well-nourished, in no acute distress; alert, appropriate and cooperative throughout examination.  HEENT: Normocephalic, atraumatic  Lungs:  Normal respiratory effort. Clear to auscultation BL without crackles or wheezes.  Heart: RRR. S1 and S2 normal without gallop, murmur, or rubs.  Abdomen:  Neuro:  BS normoactive. Soft, Nondistended, non-tender.  No masses or organomegaly. Asymmetric smile (weakness on R), CN II-XII otherwise intact; LUE, LLE, RLE all 5/5. RUE 4/5, with 1/5 strength in right hand.   Extremities: No pretibial edema in LE. Mild edema in RUE compared to LUE. No TTP, erythema, increased warmth in any extremity.      ASSESSMENT/ PLAN: Aaron [name] is an 78 yo gentleman with PMH of  HTN, DM, CHF, CAD, a fib, PVD, and L BKA who was admitted for right-sided weakness and difficulty speaking secondary to an posterior L frontal acute ischemic stroke.   Ischemic stroke - Neuro deficits improved today. Patient deemed to have worsening deficits yesterday and subsequently underwent CT head which did not reveal any hemorrhagic conversion of the patient's infarct. Worsened function previously may have been 2/2 poor tolerance of opioids for patient's back pain, which have now been discontinued.  - dys 2 diet, per SLP - inpatient rehabilitation - cont atorvastatin 80 mg - 81mg  ASA daily (d/c rectal ASA 150mg /day) - neuro following  AKI/constrast-induced nephropathy - Creatinine improved from 2.7 -> 2.16. FeNa = 0.16%. Given patient had CTA head/neck  approximately 48 hours prior to onset of AKI. Creatinine now improving, which is consistent with temporal course expected from contrast-induced nephropathy. Initial concern was present that patient was oliguric, but this appears more likely 2/2 inaccurate measurements of UOP when condom cath was in place (due to loss of urine on bed, etc.) than of true oliguria.  - cont IVF   Aspiration pneumonia - concern exists for aspiration PNA given patient's worsening leukocytosis, recent episode of aspiration, and bibasilar atelectasis on CXR.  - start zosyn (per pharmacy) - cont vanc  Back pain - resolved. Likely MSK.    Hypertension - stable. - begin reintroducing anti-HTN in AM, as patient >72hr post-stroke  Diabetes mellitus - well-controlled on SSI.   Chronic A fib - [name] Aaron [name]) adamant about pursuing warfarin anticoagulation rather than pradaxa.  - warfarin per pharmacy - patient's daughter Aaron [name]) willing to transition to pradaxa in 1-2 months if patient doing well  DVT PPX - heparin   Length of Stay: 3   Kristie Cowman, MD PGY2, Internal Medicine Resident 09/28/2012, 1:19 PM

## 2012-09-29 ENCOUNTER — Inpatient Hospital Stay (HOSPITAL_COMMUNITY): Payer: Medicare Other

## 2012-09-29 LAB — CBC WITH DIFFERENTIAL/PLATELET
HCT: 34.8 % — ABNORMAL LOW (ref 39.0–52.0)
Hemoglobin: 11 g/dL — ABNORMAL LOW (ref 13.0–17.0)
Lymphocytes Relative: 4 % — ABNORMAL LOW (ref 12–46)
Lymphs Abs: 0.5 10*3/uL — ABNORMAL LOW (ref 0.7–4.0)
MCHC: 31.6 g/dL (ref 30.0–36.0)
Monocytes Absolute: 0.7 10*3/uL (ref 0.1–1.0)
Monocytes Relative: 6 % (ref 3–12)
Neutro Abs: 11.2 10*3/uL — ABNORMAL HIGH (ref 1.7–7.7)
RBC: 3.85 MIL/uL — ABNORMAL LOW (ref 4.22–5.81)
WBC: 12.6 10*3/uL — ABNORMAL HIGH (ref 4.0–10.5)

## 2012-09-29 LAB — GLUCOSE, CAPILLARY
Glucose-Capillary: 91 mg/dL (ref 70–99)
Glucose-Capillary: 46 mg/dL — ABNORMAL LOW (ref 70–99)
Glucose-Capillary: 52 mg/dL — ABNORMAL LOW (ref 70–99)
Glucose-Capillary: 80 mg/dL (ref 70–99)
Glucose-Capillary: 262 mg/dL — ABNORMAL HIGH (ref 70–99)
Glucose-Capillary: 529 mg/dL — ABNORMAL HIGH (ref 70–99)

## 2012-09-29 LAB — CULTURE, BLOOD (ROUTINE X 2)

## 2012-09-29 LAB — COMPREHENSIVE METABOLIC PANEL
ALT: 19 U/L (ref 0–53)
Calcium: 8.9 mg/dL (ref 8.4–10.5)
GFR calc Af Amer: 50 mL/min — ABNORMAL LOW (ref >90)
Glucose, Bld: 64 mg/dL — ABNORMAL LOW (ref 70–99)
Sodium: 139 mEq/L (ref 135–145)
Total Protein: 6.5 g/dL (ref 6.0–8.3)

## 2012-09-29 LAB — BASIC METABOLIC PANEL
CO2: 20 mEq/L (ref 19–32)
Chloride: 99 mEq/L (ref 96–112)
GFR calc Af Amer: 55 mL/min — ABNORMAL LOW (ref >90)
Potassium: 5.2 mEq/L — ABNORMAL HIGH (ref 3.5–5.1)
Sodium: 130 mEq/L — ABNORMAL LOW (ref 135–145)

## 2012-09-29 LAB — URINE CULTURE
Colony Count: NO GROWTH
Culture: NO GROWTH

## 2012-09-29 LAB — PROTIME-INR: Prothrombin Time: 27.5 seconds — ABNORMAL HIGH (ref 11.6–15.2)

## 2012-09-29 MED ORDER — GLUCOSE 40 % PO GEL
1.0000 | ORAL | Status: DC | PRN
  Administered 2012-09-29: 37.5 g via ORAL
  Filled 2012-09-29: qty 1

## 2012-09-29 MED ORDER — INSULIN GLARGINE 100 UNIT/ML ~~LOC~~ SOLN: 15.0000 [IU] | Freq: Every day | SUBCUTANEOUS | Status: DC

## 2012-09-29 MED ORDER — WARFARIN - PHARMACIST DOSING INPATIENT
Freq: Every day | Status: DC
  Administered 2012-09-30: 1

## 2012-09-29 MED ORDER — VANCOMYCIN HCL IN DEXTROSE 1-5 GM/200ML-% IV SOLN: 1000.0000 mg | INTRAVENOUS | Status: DC

## 2012-09-29 MED ORDER — PANTOPRAZOLE SODIUM 40 MG PO TBEC
40.0000 mg | DELAYED_RELEASE_TABLET | Freq: Every day | ORAL | Status: DC
  Administered 2012-09-29 – 2012-10-02 (×4): 40 mg via ORAL
  Filled 2012-09-29 (×4): qty 1

## 2012-09-29 MED ORDER — INSULIN GLARGINE 100 UNIT/ML ~~LOC~~ SOLN
12.0000 [IU] | Freq: Every day | SUBCUTANEOUS | Status: DC
  Administered 2012-09-29: 12 [IU] via SUBCUTANEOUS

## 2012-09-29 MED ORDER — INSULIN ASPART 100 UNIT/ML ~~LOC~~ SOLN
0.0000 [IU] | Freq: Three times a day (TID) | SUBCUTANEOUS | Status: DC
  Administered 2012-09-29: 8 [IU] via SUBCUTANEOUS
  Administered 2012-09-29 – 2012-09-30 (×2): 5 [IU] via SUBCUTANEOUS

## 2012-09-29 MED ORDER — POTASSIUM CHLORIDE 10 MEQ/100ML IV SOLN
10.0000 meq | INTRAVENOUS | Status: DC
  Filled 2012-09-29 (×4): qty 100

## 2012-09-29 MED ORDER — POTASSIUM CHLORIDE CRYS ER 20 MEQ PO TBCR
40.0000 meq | EXTENDED_RELEASE_TABLET | ORAL | Status: AC
  Administered 2012-09-29 (×2): 40 meq via ORAL
  Filled 2012-09-29 (×2): qty 2

## 2012-09-29 MED ORDER — METOPROLOL TARTRATE 25 MG PO TABS
25.0000 mg | ORAL_TABLET | Freq: Two times a day (BID) | ORAL | Status: DC
  Administered 2012-09-29 – 2012-09-30 (×3): 25 mg via ORAL
  Filled 2012-09-29 (×4): qty 1

## 2012-09-29 NOTE — Progress Notes (Signed)
INITIAL ADULT NUTRITION ASSESSMENT Date: 09/29/2012   Time: 9:09 AM Reason for Assessment: MD consult for Poor PO  INTERVENTION: Magic cup TID between meals, each supplement provides 290 kcal and 9 grams of protein.   DOCUMENTATION CODES Per approved criteria  -Not Applicable    ASSESSMENT: Male 78 y.o.  Dx: Acute ischemic stroke  Hx:  Past Medical History  Diagnosis Date  . Hypertension   . Diabetes mellitus   . CHF (congestive heart failure)   . Coronary artery disease   . Atrial fibrillation   . PVD (peripheral vascular disease)   . S/P CABG x 1 2008  . S/P aortic valve replacement with porcine valve 2008   Past Surgical History  Procedure Date  . Below knee leg amputation 07/2012  . Cataract extraction w/ intraocular lens implant   . Abdominal surgery   . Hernia repair     x5  . Back surgery     vertebroplasty  . Coronary artery bypass graft 2008   Related Meds:  Scheduled Meds:   . aspirin EC  81 mg Oral Daily  . atorvastatin  80 mg Oral q1800  . heparin subcutaneous  5,000 Units Subcutaneous Q8H  . insulin aspart  0-15 Units Subcutaneous TID WC  . insulin glargine  15 Units Subcutaneous QHS  . pantoprazole (PROTONIX) IV  40 mg Intravenous QHS  . piperacillin-tazobactam (ZOSYN)  IV  3.375 g Intravenous Q8H  . sodium chloride  500 mL Intravenous Once  . vancomycin  1,000 mg Intravenous Q48H  . DISCONTD: aspirin EC  81 mg Oral Daily  . DISCONTD: aspirin  150 mg Rectal Daily  . DISCONTD: aspirin  150 mg Rectal Daily  . DISCONTD: dabigatran  150 mg Oral Q12H  . DISCONTD: insulin aspart  0-20 Units Subcutaneous TID WC  . DISCONTD: insulin glargine  20 Units Subcutaneous QHS  . DISCONTD: warfarin  4 mg Oral ONCE-1800  . DISCONTD: Warfarin - Pharmacist Dosing Inpatient   Does not apply q1800   Continuous Infusions:   . sodium chloride 1,000 mL (09/29/12 0547)   PRN Meds:.acetaminophen, bisacodyl, dextrose, diphenhydrAMINE, ondansetron (ZOFRAN) IV  Ht:  5\' 8"  (172.7 cm)  Wt: 151 lb 14.4 oz (68.9 kg)  Ideal Wt: 65.8 kg (adjusted for L BKA) % Ideal Wt: 105%  Usual Wt:  185 lbs prior to L BKA 2 months ago Wt Readings from Last 10 Encounters:  09/27/12 151 lb 14.4 oz (68.9 kg)   % Usual Wt: 82%  BMI: 22 WNL (adjusted for L BKA)  Food/Nutrition Related Hx: provided by daughter  Labs:  CMP     Component Value Date/Time   NA 139 09/28/2012 0650   K 4.4 09/28/2012 0650   CL 103 09/28/2012 0650   CO2 20 09/28/2012 0650   GLUCOSE 121* 09/28/2012 0650   BUN 41* 09/28/2012 0650   CREATININE 2.16* 09/28/2012 0650   CALCIUM 9.0 09/28/2012 0650   PROT 6.9 09/28/2012 1605   ALBUMIN 2.9* 09/28/2012 1605   AST 82* 09/28/2012 1605   ALT 22 09/28/2012 1605   ALKPHOS 115 09/28/2012 1605   BILITOT 0.5 09/28/2012 1605   GFRNONAA 26* 09/28/2012 0650   GFRAA 30* 09/28/2012 0650   CBG (last 3)   Basename 09/29/12 0410 09/28/12 2341 09/28/12 1953  GLUCAP 91 171* 176*   Lab Results  Component Value Date   HGBA1C 8.0* 09/25/2012    Intake/Output Summary (Last 24 hours) at 09/29/12 0910 Last data filed at 09/29/12 0700  Gross per 24 hour  Intake    660 ml  Output    650 ml  Net     10 ml   Last BM: 09/26/12  Diet Order: Dysphagia 2 with Thin Liquids Meal completion 0-50%  Supplements/Tube Feeding: none  IVF:    sodium chloride Last Rate: 1,000 mL (09/29/12 0547)   Pt admitted with right-sided paresis and dysarthria. Pt lives at home with 24 hr home health aide to assist him since having a L BKA on 07/29/12.  Pt sleeping after given nausea medication.  Per daughter pt has been eating poorly since amputation 2 months ago. Unable to determine amount of weight loss from amputation vs poor appetite. During this 2 months pt has only been home x 1 week. Prior to this he was hospitalized and then in rehab.  Pt was drinking a lot of Glucerna while institutionalized but daughter felt that he was drinking and then not eating.  Family was  only giving him Glucerna if he would eat something. Daughter would prefer not to offer another supplement for now in fear that he will not eat. She is willing to offer him Magic cups as she is concerned about weight loss and poor appetite.  Spoke to RN about supplementing pt's diet.  Suspect some level of malnutrition but unable to determine at this time. There are no previous records in our system.  Skin tears noted in RN assessment.  Estimated Nutritional Needs:   Kcal:  1675-1900 Protein:  85-95 grams  Fluid:  >2 L/day  NUTRITION DIAGNOSIS: -Inadequate oral intake r/t poor appetite AEB meal completion < 50%.   MONITORING/EVALUATION(Goals): Goal: Pt to meet >/= 90% of their estimated nutrition needs. Monitor: PO intake, supplement acceptance, weight  EDUCATION NEEDS: -No education needs identified at this time  Kendell Bane RD, LDN, CNSC [phone] Pager [phone] After Hours Pager  09/29/2012, 9:09 AM

## 2012-09-29 NOTE — Evaluation (Signed)
Speech Language Pathology Evaluation Patient Details Name: Aaron [name] MRN: [id number] DOB: 20-Apr-1924 Today's Date: 09/29/2012 Time: 1000-1015 SLP Time Calculation (min): 15 min  Problem List:  Patient Active Problem List  Diagnosis  . Acute ischemic stroke  . Hypertension  . Diabetes mellitus  . A-fib  . S/P CABG x 1  . Aortic valve replaced  . Back pain   Past Medical History:  Past Medical History  Diagnosis Date  . Hypertension   . Diabetes mellitus   . CHF (congestive heart failure)   . Coronary artery disease   . Atrial fibrillation   . PVD (peripheral vascular disease)   . S/P CABG x 1 2008  . S/P aortic valve replacement with porcine valve 2008   Past Surgical History:  Past Surgical History  Procedure Date  . Below knee leg amputation 07/2012  . Cataract extraction w/ intraocular lens implant   . Abdominal surgery   . Hernia repair     x5  . Back surgery     vertebroplasty  . Coronary artery bypass graft 2008   HPI:  78 y/o male admitted to [facility] on 09/25/12 with right sided weakness, right facial droop and difficulty with speech. MRI indicates acute non hemorrhagic infarct within posterior left frontal lobe and remote lacunar infarcts.  Patient with increased weakness on 10/1-. CT of head completed on 10/10 reveals extension of posterior left frontal lobe infarct.  Patient referred for Cognitive Linguistic Evaluation per stroke protocol.     Assessment / Plan / Recommendation Clinical Impression  Cognitive Linguistic Evaluation completed. Cognition and receptive and expressive language baseline. Min to Moderate dysarthria present reducing  intelligibility of speech at  conversational level. Intellegibility functional to effectively communicate wants/needs in acute care setting.   Recommend further Speech Language Pathology services at next level of care if warranted.      SLP Assessment  All further Speech Lanaguage Pathology  needs can be addressed in the  next venue of care    Follow Up Recommendations  Inpatient Rehab    Frequency and Duration         SLP Evaluation Prior Functioning  Cognitive/Linguistic Baseline: Within functional limits Type of Home: House Lives With: Alone Available Help at Discharge: Personal care attendant;Available 24 hours/day Vocation: Retired   IT consultant  Overall Cognitive Status: Appears within functional limits for tasks assessed Arousal/Alertness: Awake/alert Orientation Level: Oriented X4    Comprehension  Auditory Comprehension Overall Auditory Comprehension: Other (comment) (HOH)    Expression Expression Primary Mode of Expression: Verbal Verbal Expression Overall Verbal Expression: Appears within functional limits for tasks assessed Written Expression Dominant Hand: Right Written Expression: Not tested   Oral / Motor Oral Motor/Sensory Function Overall Oral Motor/Sensory Function: Impaired Labial ROM: Reduced right Labial Symmetry: Abnormal symmetry right Labial Strength: Reduced Labial Sensation: Reduced Lingual ROM: Reduced right Lingual Symmetry: Abnormal symmetry right Lingual Strength: Reduced Lingual Sensation: Reduced Facial ROM: Reduced right Facial Symmetry: Right droop Facial Strength: Reduced Facial Sensation: Reduced Velum: Within Functional Limits Mandible: Within Functional Limits Motor Speech Overall Motor Speech: Impaired Phonation: Hoarse Articulation: Impaired Level of Impairment: Sentence Intelligibility: Intelligibility reduced Word: 75-100% accurate Phrase: 75-100% accurate Sentence: 50-74% accurate Conversation: 50-74% accurate Motor Planning: Witnin functional limits Interfering Components: Hearing loss;Inadequate dentition Effective Techniques: Slow rate;Increased vocal intensity;Over-articulate   GO    Moreen Fowler MS, CCC-SLP [phone] [facility] 09/29/2012, 2:03 PM

## 2012-09-29 NOTE — Progress Notes (Signed)
ANTICOAGULATION CONSULT NOTE - Follow Up Consult  Pharmacy Consult for coumadin  Indication: atrial fibrillation, CVA  Allergies  Allergen Reactions  . Metformin And Related Hives  . Penicillins Hives and Swelling    Patient Measurements: Height: 5\' 8"  (172.7 cm) Weight: 151 lb 14.4 oz (68.9 kg) IBW/kg (Calculated) : 68.4   Vital Signs: Temp: 98.2 F (36.8 C) (10/12 1357) Temp src: Oral (10/12 1357) BP: 146/79 mmHg (10/12 1357) Pulse Rate: 97  (10/12 1357)  Labs:  Basename 09/29/12 0900 09/29/12 0640 09/28/12 0650 09/27/12 1820 09/27/12 1142  HGB 11.0* -- 11.6* -- --  HCT 34.8* -- 36.3* -- 34.3*  PLT 321 -- 367 -- 394  APTT -- -- -- -- --  LABPROT -- 27.5* -- -- --  INR -- 2.72* -- -- --  HEPARINUNFRC -- -- -- -- --  CREATININE 1.43* -- 2.16* 2.70* --  CKTOTAL -- -- -- -- --  CKMB -- -- -- -- --  TROPONINI -- -- -- -- --    Estimated Creatinine Clearance: 35.9 ml/min (by C-G formula based on Cr of 1.43).   Medications:  Scheduled:    . aspirin EC  81 mg Oral Daily  . atorvastatin  80 mg Oral q1800  . heparin subcutaneous  5,000 Units Subcutaneous Q8H  . insulin aspart  0-15 Units Subcutaneous TID WC  . insulin glargine  15 Units Subcutaneous QHS  . metoprolol tartrate  25 mg Oral BID  . pantoprazole (PROTONIX) IV  40 mg Intravenous QHS  . piperacillin-tazobactam (ZOSYN)  IV  3.375 g Intravenous Q8H  . potassium chloride  10 mEq Intravenous Q1 Hr x 4  . sodium chloride  500 mL Intravenous Once  . vancomycin  1,000 mg Intravenous Q48H  . DISCONTD: insulin aspart  0-20 Units Subcutaneous TID WC  . DISCONTD: insulin glargine  20 Units Subcutaneous QHS  . DISCONTD: warfarin  4 mg Oral ONCE-1800  . DISCONTD: Warfarin - Pharmacist Dosing Inpatient   Does not apply q1800    Assessment: 78 yo male here with CVA and also noted with history of afib on coumadin PTA (2.5mg /day).  Patient was on Pradaxa but family prefers coumadin. INR today is 2.72 (noted as 1.35 on  10/8).  Only two doses of Pradaxa given on 09/26/12 and would anticipate minimal effect on INR at this point.  It is noted that as outpt he was recently found to be subtherapeutic on his Coumadin, and therefore it was held for 4 days and was restarted on 09/23/12.  Also noted on vancomycin and zosyn for aspiration PNA. SCr=1.43 with trend down (2.7> 2.16> 1.43). Last dose of vancomycin 10/12 at ~ 2pm  10/10 vancomycin> 10/11 zosyn>  10/10 blood: CONS 1/2 10/11 urine- neg   Goal of Therapy:  INR 2-3 Monitor platelets by anticoagulation protocol: Yes Vancomycin trough= 15-20   Plan:  -Hold coumadin for now and d/c heparin SQ -Change vancomycin to 1000mg  IV q24hr -Will follow cultures and PT/INR  Harland German, Pharm D 09/29/2012 3:24 PM

## 2012-09-29 NOTE — Progress Notes (Signed)
Speech Language Pathology Dysphagia Treatment Patient Details Name: Aaron [name] MRN: [id number] DOB: 1924-08-19 Today's Date: 09/29/2012 Time: 0930-1000 SLP Time Calculation (min): 30 min  Assessment / Plan / Recommendation Clinical Impression  Patient seen for diagnostic treatment for diet tolerance following MBS completed on  09/28/12. Patient not seen directly with PO trials due to refusal as he stated he had just "eaten breakfast".  Treatment focused on recommended swallow strategies. Patient correctly stated  precautions and strategies and reasoning with min verbal cues.  Recommend to continue current diet of dysphagia 2 with thin liquid by cup only.  ST to follow in acute care setting for diet tolerance.      Diet Recommendation  Continue with Current Diet: Dysphagia 2 (fine chop) Initiate / Change Diet: Thin liquid    SLP Plan Continue with current plan of care      Swallowing Goals  SLP Swallowing Goals Swallow Study Goal #1 - Progress: Progressing toward goal Swallow Study Goal #2 - Progress: Progressing toward goal  General Temperature Spikes Noted: No Respiratory Status: Room air Behavior/Cognition: Alert;Cooperative;Pleasant mood Oral Cavity - Dentition: Poor condition;Missing dentition Patient Positioning: Upright in bed  Oral Cavity - Oral Hygiene Does patient have any of the following "at risk" factors?: Other - dysphagia;Nutritional status - inadequate Patient is HIGH RISK - Oral Care Protocol followed (see row info): Yes Patient is AT RISK - Oral Care Protocol followed (see row info): Yes   Dysphagia Treatment Treatment focused on: Skilled observation of diet tolerance Treatment Methods/Modalities: Skilled observation Patient observed directly with PO's: No Reason PO's not observed: Beatrice Lecher MS, CCC-SLP [phone] [facility] 09/29/2012, 1:42 PM

## 2012-09-29 NOTE — Progress Notes (Addendum)
Patient not wanting foley catheter, ok to leave out per internal medicine team. Patient returned to bed, will recheck blood glucose and keep close check on patient neuro. Status over night and report and changes to medical team. Telemetry to stay to monitor for any changes.

## 2012-09-29 NOTE — Progress Notes (Signed)
Stroke Team Progress Note  HISTORY Aaron [name] is an 78 y.o. male awoke 09/25/2012 am with right-sided weakness and aphasia. He had been seen by his caregiver at 6 AM when she checked his glucose. At that time, he was able to talk and move his right side well. At 8 AM when she rechecked on him, he was not moving his right side or talking. She called 911.   On arrival here, he had some improvement with strength in his right arm. Over the course of my examination, he began speaking. Of note, he was recently found to be subtherapeutic on his Coumadin, and therefore it was held for 4 days. He was restarted on Sunday.  Patient was not a TPA candidate secondary to presenting outside the window. He was admitted to for further evaluation and treatment.  SUBJECTIVE  Patient has shown improvement in his right hemiparesis, speech and swallowing. His renal function is also better. He is on vanc/zosyn for presumed aspiration pneumonia. OBJECTIVE Most recent Vital Signs: Filed Vitals:   09/28/12 2132 09/29/12 0145 09/29/12 0634 09/29/12 0943  BP: 128/70 132/88 132/76 140/82  Pulse: 100 98 99 90  Temp: 98.1 F (36.7 C) 98 F (36.7 C) 97.5 F (36.4 C) 98.1 F (36.7 C)  TempSrc: Oral Oral Oral Oral  Resp: 20 20 18 18   Height:      Weight:      SpO2: 94% 96% 93% 96%   CBG (last 3)   Basename 09/29/12 0410 09/28/12 2341 09/28/12 1953  GLUCAP 91 171* 176*   Intake/Output from previous day: 10/11 0701 - 10/12 0700 In: 660 [P.O.:660] Out: 650 [Urine:650]  IV Fluid Intake:      . sodium chloride 1,000 mL (09/29/12 0547)    MEDICATIONS     . aspirin EC  81 mg Oral Daily  . atorvastatin  80 mg Oral q1800  . heparin subcutaneous  5,000 Units Subcutaneous Q8H  . insulin aspart  0-15 Units Subcutaneous TID WC  . insulin glargine  15 Units Subcutaneous QHS  . metoprolol tartrate  25 mg Oral BID  . pantoprazole (PROTONIX) IV  40 mg Intravenous QHS  . piperacillin-tazobactam (ZOSYN)  IV   3.375 g Intravenous Q8H  . sodium chloride  500 mL Intravenous Once  . vancomycin  1,000 mg Intravenous Q48H  . DISCONTD: aspirin  150 mg Rectal Daily  . DISCONTD: insulin aspart  0-20 Units Subcutaneous TID WC  . DISCONTD: insulin glargine  20 Units Subcutaneous QHS  . DISCONTD: warfarin  4 mg Oral ONCE-1800  . DISCONTD: Warfarin - Pharmacist Dosing Inpatient   Does not apply q1800   PRN:  acetaminophen, bisacodyl, dextrose, diphenhydrAMINE, ondansetron (ZOFRAN) IV  Diet:  Dysphagia Activity:  Bedrest DVT Prophylaxis:  Heparin 5000 units SQ Q 8 hours + SCD  CLINICALLY SIGNIFICANT STUDIES Basic Metabolic Panel:   Lab 09/29/12 0900 09/28/12 0650 09/26/12 0625  NA 139 139 --  K 3.4* 4.4 --  CL 106 103 --  CO2 22 20 --  GLUCOSE 64* 121* --  BUN 32* 41* --  CREATININE 1.43* 2.16* --  CALCIUM 8.9 9.0 --  MG -- -- 2.2  PHOS -- -- --   Liver Function Tests:   Lab 09/29/12 0900 09/28/12 1605  AST 54* 82*  ALT 19 22  ALKPHOS 104 115  BILITOT 0.7 0.5  PROT 6.5 6.9  ALBUMIN 2.6* 2.9*   CBC:   Lab 09/29/12 0900 09/28/12 0650 09/25/12 0945  WBC 12.6* 26.5* --  NEUTROABS 11.2* -- 8.4*  HGB 11.0* 11.6* --  HCT 34.8* 36.3* --  MCV 90.4 89.4 --  PLT 321 367 --   Coagulation:   Lab 09/29/12 0640 09/25/12 0930  LABPROT 27.5* 16.4*  INR 2.72* 1.35   Cardiac Enzymes:   Lab 09/25/12 0945  CKTOTAL --  CKMB --  CKMBINDEX --  TROPONINI <0.30   Urinalysis:   Lab 09/27/12 1836  COLORURINE AMBER*  LABSPEC 1.020  PHURINE 5.0  GLUCOSEU 100*  HGBUR NEGATIVE  BILIRUBINUR LARGE*  KETONESUR 15*  PROTEINUR 100*  UROBILINOGEN 1.0  NITRITE NEGATIVE  LEUKOCYTESUR NEGATIVE   Lipid Panel    Component Value Date/Time   CHOL 132 09/26/2012 0625   TRIG 135 09/26/2012 0625   HDL 31* 09/26/2012 0625   CHOLHDL 4.3 09/26/2012 0625   VLDL 27 09/26/2012 0625   LDLCALC 74 09/26/2012 0625   HgbA1C  Lab Results  Component Value Date   HGBA1C 8.0* 09/25/2012    CT of the brain   09/25/2012  No intracranial hemorrhage.  Small vessel disease type changes.  Remote right occipital lobe and right cerebellar infarcts suspected without CT evidence of large acute infarct.    CT Head Wo Contrast 09/27/2012 The posterior left frontal lobe acute infarct appears larger than on the prior MR. There may have been interval extension of this infarct (as versus result of edema from expected evolution of the acute infarct causing the CT appearance). No associated hemorrhage.  Ct Angio Head 09/25/2012   1.  High-grade stenosis within the cavernous left internal carotid artery. 2.  Moderate supraclinoid internal carotid artery stenoses bilaterally. 3.  Mild to moderate irregularity of the proximal A1 and M1 segments bilaterally without significant stenosis. 4.  Moderate small vessel disease. 5.  Apart from the cavernous and supraclinoid left internal carotid artery stenoses, no other focal lesion is evident to explain the patient's symptoms.    Dg Lumbar Spine Complete 09/25/2012   Cement vertebral augmentation at T11, T12, and L1.  Compression fractures of L3 and L4 of indeterminate age.  2 cm partial staghorn calculus in the right renal pelvis.     CT Angio Neck 09/25/2012   1.  High-grade stenosis of the right vertebral artery at its origin with tandem stenoses at C6-7, C5-6, and at the dural margin.  The distal vertebral artery is essentially occluded. 2.  Mild narrowing of the proximal left vertebral artery, dominant vessel without other significant stenoses through the neck. 3.  50% stenosis of the proximal left internal carotid artery. 4.  Atherosclerotic calcifications of the proximal right internal carotid artery without significant stenosis. 5.  Tortuosity of the internal carotid arteries bilaterally as well as the left vertebral artery, likely secondary to hypertension. 6.  Moderate spondylosis of the cervical spine.   MRI of the brain  09/25/2012 1.  Acute non hemorrhagic infarct within the  posterior left frontal lobe, along the central sulcus measures less than 2 cm. 2.  Atrophy and mild diffuse white matter disease likely reflects the sequelae of chronic microvascular ischemia. 3.  Remote lacunar infarcts of the cerebellum bilaterally. 4.  Acute left maxillary sinus disease.  MRA of the brain  See CTA head  2D Echocardiogram  EF 65-70% with no source of embolus. A bioprosthesis was present  Carotid Doppler  See CTA neck  EKG  atrial fibrillation, rate 92.   Therapy Recommendations PT - CIR ; OT - CIR   Physical Exam   Elderly Caucasian male currently  not in distress.Awake alert. Afebrile. Head is nontraumatic. Neck is supple without bruit. Hearing is normal. Cardiac exam no murmur or gallop. Lungs are clear to auscultation. Distal pulses are well felt except Left leg below-knee amputation with bandage. Neurological Exam : awake alert and moderate expressive aphasia with speaking only a few words some occasional short sentences and difficult to understand. Follows midline and one step commands only. Extraocular moments are full range without nystagmus. Fundi were not visualized. Diminished blink to threat on the right compared to the left. Mild right lower facial weakness. Tongue midline. Mild hemiplegia with 3/5 strength in the right upper extremity and 3/5 in right lower extremity. Good antigravity strength in the left. Coordination sensation cannot be reliably tested. Gait was not tested.  ASSESSMENT Mr. Kashaun Bebo is a 78 y.o. male presenting with right hemiparesis and aphasia. Imaging confirms a  posterior left frontal lobe infarct. Infarct felt to be embolic secondary to known atrial fibrillation.  On warfarin prior to admission, though INR subtherapeutic on arrival at 1.35 after coumadin being held x 4 days, resumed on Sunday prior to admission. Now on rectal Aspirin 150mg  daily for secondary stroke prevention. With  neuro worsening, dysarthria, right arm hemiparesis and  dysphagia and extension of left frontal lobe stroke on head CT, the patient has been made NPO for official DG swallowing functional study today.   Porcine valve atrial fibrillation, heart rate up to 165 during the night Hypertension Diabetes Mellitus type 2, uncontrolled, HgbA1c 8.0 CAD PVD, L BKA hx LDL 74 Acute kidney failure, felt to be due to obstruction--foley cath with u/a, renal ultrasound shows no calculi  Hospital day # 4  TREATMENT/PLAN     Continue Pradaxa 75mg  twice daily (due to renal function) with baby aspirin 81mg  daily (this due to PVD/PAD). Stop rectal aspirin 150mg  daily.  Aggressive glycemic control  Statin therapy, lipitor 80mg  daily  Recommend CIR consult  Recommend out of bed with assist only  D/W daughter and son inlaw and answered questions.  Stroke team will sign off. Call for questions.   Delia Heady, MD Medical Director [facility] Pager: [phone] 09/29/2012 11:40 AM    09/29/2012 11:37 AM

## 2012-09-29 NOTE — Progress Notes (Signed)
Patient complaining of shortness of breath, and has expiratory wheezing. Page to internal medicine teaching service to come up and evaluate patient

## 2012-09-29 NOTE — Progress Notes (Signed)
Subjective: Patient states he is feeling well this morning. Was noted by nursing to have asymptomatic hypoglycemia with CBGs ~40s. At time of exam patient was eating breakfast and not reporting any nausea.   No acute interval events.   Objective: Vital signs in last 24 hours: Filed Vitals:   09/28/12 1804 09/28/12 2132 09/29/12 0145 09/29/12 0634  BP: 149/86 128/70 132/88 132/76  Pulse: 102 100 98 99  Temp: 98.1 F (36.7 C) 98.1 F (36.7 C) 98 F (36.7 C) 97.5 F (36.4 C)  TempSrc: Oral Oral Oral Oral  Resp: 18 20 20 18   Height:      Weight:      SpO2: 98% 94% 96% 93%   Weight change:   Intake/Output Summary (Last 24 hours) at 09/29/12 0918 Last data filed at 09/29/12 0700  Gross per 24 hour  Intake    660 ml  Output    650 ml  Net     10 ml   Physical Exam:  General:  Vital signs reviewed and noted. Well-developed, well-nourished, in no acute distress; alert, appropriate and cooperative throughout examination.   HEENT:  Normocephalic, atraumatic   Lungs:  Normal respiratory effort. Clear to auscultation BL without crackles or wheezes.   Heart:  RRR. S1 and S2 normal without gallop, murmur, or rubs.   Abdomen:  Neuro:  BS normoactive. Soft, Nondistended, non-tender. No masses or organomegaly.  Asymmetric smile (weakness on R), CN II-XII otherwise intact; LUE, LLE, RLE all 5/5. RUE 3/5, with 1/5 strength in right hand.   Extremities:  No pretibial edema in LE. Mild edema in RUE compared to LUE. No TTP, erythema, increased warmth in any extremity.     Lab Results: Basic Metabolic Panel:  Lab 09/28/12 2130 09/27/12 1820 09/26/12 0625  NA 139 138 --  K 4.4 4.6 --  CL 103 102 --  CO2 20 22 --  GLUCOSE 121* 167* --  BUN 41* 44* --  CREATININE 2.16* 2.70* --  CALCIUM 9.0 9.3 --  MG -- -- 2.2  PHOS -- -- --   Liver Function Tests:  Lab 09/28/12 1605 09/25/12 0945  AST 82* 21  ALT 22 10  ALKPHOS 115 96  BILITOT 0.5 0.4  PROT 6.9 7.2  ALBUMIN 2.9* 2.9*    CBC:  Lab 09/28/12 0650 09/27/12 1142 09/25/12 0945  WBC 26.5* 22.4* --  NEUTROABS -- -- 8.4*  HGB 11.6* 10.7* --  HCT 36.3* 34.3* --  MCV 89.4 90.3 --  PLT 367 394 --   Cardiac Enzymes:  Lab 09/25/12 0945  CKTOTAL --  CKMB --  CKMBINDEX --  TROPONINI <0.30   CBG:  Lab 09/29/12 0410 09/28/12 2341 09/28/12 1953 09/28/12 1554 09/28/12 1138 09/28/12 0807  GLUCAP 91 171* 176* 297* 188* 103*   Hemoglobin A1C:  Lab 09/25/12 1403  HGBA1C 8.0*   Fasting Lipid Panel:  Lab 09/26/12 0625  CHOL 132  HDL 31*  LDLCALC 74  TRIG 865  CHOLHDL 4.3  LDLDIRECT --   Coagulation:  Lab 09/29/12 0640 09/25/12 0930  LABPROT 27.5* 16.4*  INR 2.72* 1.35   Urinalysis:  Lab 09/27/12 1836  COLORURINE AMBER*  LABSPEC 1.020  PHURINE 5.0  GLUCOSEU 100*  HGBUR NEGATIVE  BILIRUBINUR LARGE*  KETONESUR 15*  PROTEINUR 100*  UROBILINOGEN 1.0  NITRITE NEGATIVE  LEUKOCYTESUR NEGATIVE   Studies/Results: Dg Chest 2 View  09/27/2012  *RADIOLOGY REPORT*  Clinical Data: Rule out aspiration.  Stroke  CHEST - 2 VIEW  Comparison: None.  Findings: Prior CABG.  Aortic valve replacement.  Cement vertebral augmentation in the lumbar spine at three levels.  Mild bibasilar airspace disease with the appearance of atelectasis. No definite pneumonia or effusion.  Negative for heart failure.  IMPRESSION: Mild bibasilar airspace disease, most likely atelectasis.   Original Report Authenticated By: Camelia Phenes, M.D.    Ct Head Wo Contrast  09/27/2012  *RADIOLOGY REPORT*  Clinical Data: Right-sided weakness worsening this morning. Diabetic hypertensive.  Atrial fibrillation.  CT HEAD WITHOUT CONTRAST  Technique:  Contiguous axial images were obtained from the base of the skull through the vertex without contrast.  Comparison: 09/25/2012 MR and CT.  Findings: The posterior left frontal lobe acute infarct (motor strip) appears larger than on the prior MR.  There may have been interval extension of this  infarct (as versus result of edema from expected evolution of the acute infarct causing the CT appearance). No associated hemorrhage.  No new findings otherwise noted.  Small vessel disease type changes.  Global atrophy without hydrocephalus.  Remote small cerebellar infarcts. No intracranial mass lesion detected on this unenhanced exam.  Vascular calcifications.  Mucosal thickening left maxillary sinus.  IMPRESSION:  The posterior left frontal lobe acute infarct appears larger than on the prior MR.  There may have been interval extension of this infarct (as versus result of edema from expected evolution of the acute infarct causing the CT appearance).  No associated hemorrhage.  This has been made a PRA call report utilizing dashboard call feature.   Original Report Authenticated By: Fuller Canada, M.D.    US Renal  09/27/2012  *RADIOLOGY REPORT*  Clinical Data: Acute renal failure  RENAL/URINARY TRACT ULTRASOUND COMPLETE  Comparison:  Lumbar spine radiographs 09/25/2012  Findings:  Right Kidney:  10.2 cm. No hydronephrosis or focal mass.  Mildly suboptimal visualization due to adjacent bowel gas and acoustic window.  Allowing for this, no echogenic shadowing calculus is identified within the right renal pelvis.  Left Kidney:  Not visualized due to bowel gas.  Bladder:  Bladder is decompressed with a Foley catheter in place.  IMPRESSION: The right kidney is within normal limits in appearance and no echogenic shadowing calculi identified.  It is possible this obscured by adjacent bowel or could have been passed into non visualized portion of the ureter.  No other abnormality is identified to explain the history of acute renal failure, but the left kidney is not visualized.  If there is strong clinical suspicion for renal calculus or other complication given findings on recent lumbar spine radiographs, consider CT abdomen/pelvis for further evaluation.   Original Report Authenticated By: Harrel Lemon, M.D.     Medications: I have reviewed the patient's current medications. Scheduled Meds:   . aspirin EC  81 mg Oral Daily  . atorvastatin  80 mg Oral q1800  . heparin subcutaneous  5,000 Units Subcutaneous Q8H  . insulin aspart  0-15 Units Subcutaneous TID WC  . insulin glargine  15 Units Subcutaneous QHS  . pantoprazole (PROTONIX) IV  40 mg Intravenous QHS  . piperacillin-tazobactam (ZOSYN)  IV  3.375 g Intravenous Q8H  . sodium chloride  500 mL Intravenous Once  . vancomycin  1,000 mg Intravenous Q48H  . DISCONTD: aspirin EC  81 mg Oral Daily  . DISCONTD: aspirin  150 mg Rectal Daily  . DISCONTD: aspirin  150 mg Rectal Daily  . DISCONTD: dabigatran  150 mg Oral Q12H  . DISCONTD: insulin aspart  0-20 Units Subcutaneous TID  WC  . DISCONTD: insulin glargine  20 Units Subcutaneous QHS  . DISCONTD: warfarin  4 mg Oral ONCE-1800  . DISCONTD: Warfarin - Pharmacist Dosing Inpatient   Does not apply q1800   Continuous Infusions:   . sodium chloride 1,000 mL (09/29/12 0547)   PRN Meds:.acetaminophen, bisacodyl, dextrose, diphenhydrAMINE, ondansetron (ZOFRAN) IV Assessment/Plan: [name] is an 78 yo gentleman with PMH of HTN, DM, CHF, CAD, a fib, PVD, and L BKA who was admitted for right-sided weakness and difficulty speaking secondary to an posterior L frontal acute ischemic stroke.   Ischemic stroke - Stable. Dysarthria mildly improved, and RUE strength mildly decreased.  - dys 2 diet, per SLP  - cont atorvastatin 80 mg  - 81mg  ASA daily (d/c rectal ASA 150mg /day)  - neuro following   AKI/constrast-induced nephropathy - Creatinine improved from 2.7 -> 2.16. FeNa = 0.16%. Awaiting BMET from this AM. - cont IVF   Aspiration pneumonia - afebrile overnight. Minimal cough. Awaiting CBC this AM (WBC = 26.5 yesterday). 1/2 blood cultures grew gram + cocci in clusters. As only 1/2 cultures have grown bacteria, this likely represents specimen contamination. Awaiting identification of organism.   - cont vanc/zosyn   Back pain - resolved. Likely MSK.   Hypertension - stable. Will begin reinitiating home BP meds at low doses.  - metoprolol 25mg  bid  Diabetes mellitus - Hypoglycemic this AM, otherwise well-controlled on SSI.  - decrease SSI from resistant to moderate coverage - decrease qhs lantus from 20U to 15U  Chronic A fib - [name] [facility]) adamant about pursuing warfarin anticoagulation rather than pradaxa.  - warfarin per pharmacy  - patient's daughter [facility]) willing to transition to pradaxa in 1-2 months if patient doing well   DVT PPX - heparin     LOS: 4 days   Elena Cothern R 09/29/2012, 9:18 AM

## 2012-09-30 ENCOUNTER — Inpatient Hospital Stay (HOSPITAL_COMMUNITY): Payer: Medicare Other

## 2012-09-30 DIAGNOSIS — I214 Non-ST elevation (NSTEMI) myocardial infarction: Secondary | ICD-10-CM | POA: Diagnosis not present

## 2012-09-30 LAB — CK TOTAL AND CKMB (NOT AT ARMC)
CK, MB: 5.6 ng/mL — ABNORMAL HIGH (ref 0.3–4.0)
CK, MB: 4.9 ng/mL — ABNORMAL HIGH (ref 0.3–4.0)
Relative Index: INVALID (ref 0.0–2.5)
Relative Index: INVALID (ref 0.0–2.5)
Total CK: 56 U/L (ref 7–232)

## 2012-09-30 LAB — BASIC METABOLIC PANEL
Calcium: 8.5 mg/dL (ref 8.4–10.5)
GFR calc non Af Amer: 53 mL/min — ABNORMAL LOW (ref >90)
Glucose, Bld: 289 mg/dL — ABNORMAL HIGH (ref 70–99)
Sodium: 135 mEq/L (ref 135–145)

## 2012-09-30 LAB — CBC
MCH: 28.2 pg (ref 26.0–34.0)
Platelets: 292 10*3/uL (ref 150–400)
RBC: 3.93 MIL/uL — ABNORMAL LOW (ref 4.22–5.81)
WBC: 11.7 10*3/uL — ABNORMAL HIGH (ref 4.0–10.5)

## 2012-09-30 LAB — GLUCOSE, CAPILLARY
Glucose-Capillary: 189 mg/dL — ABNORMAL HIGH (ref 70–99)
Glucose-Capillary: 211 mg/dL — ABNORMAL HIGH (ref 70–99)
Glucose-Capillary: 346 mg/dL — ABNORMAL HIGH (ref 70–99)

## 2012-09-30 LAB — PROTIME-INR
INR: 1.54 — ABNORMAL HIGH (ref 0.00–1.49)
Prothrombin Time: 18 s — ABNORMAL HIGH (ref 11.6–15.2)

## 2012-09-30 LAB — TROPONIN I
Troponin I: 5.28 ng/mL (ref <0.30)
Troponin I: 6.63 ng/mL (ref <0.30)

## 2012-09-30 LAB — MRSA PCR SCREENING: MRSA by PCR: NEGATIVE

## 2012-09-30 LAB — PRO B NATRIURETIC PEPTIDE: Pro B Natriuretic peptide (BNP): 29199 pg/mL — ABNORMAL HIGH (ref 0–450)

## 2012-09-30 MED ORDER — METOPROLOL TARTRATE 50 MG PO TABS
75.0000 mg | ORAL_TABLET | Freq: Two times a day (BID) | ORAL | Status: DC
  Administered 2012-09-30 – 2012-10-03 (×6): 75 mg via ORAL
  Filled 2012-09-30 (×7): qty 1

## 2012-09-30 MED ORDER — INSULIN ASPART 100 UNIT/ML ~~LOC~~ SOLN
0.0000 [IU] | Freq: Three times a day (TID) | SUBCUTANEOUS | Status: DC
  Administered 2012-09-30: 15 [IU] via SUBCUTANEOUS
  Administered 2012-09-30 – 2012-10-01 (×2): 11 [IU] via SUBCUTANEOUS
  Administered 2012-10-01 (×2): 4 [IU] via SUBCUTANEOUS
  Administered 2012-10-02: 7 [IU] via SUBCUTANEOUS
  Administered 2012-10-02: 6 [IU] via SUBCUTANEOUS
  Administered 2012-10-02: 7 [IU] via SUBCUTANEOUS
  Administered 2012-10-03: 20 [IU] via SUBCUTANEOUS
  Administered 2012-10-03 (×2): 4 [IU] via SUBCUTANEOUS

## 2012-09-30 MED ORDER — INFLUENZA VIRUS VACC SPLIT PF IM SUSP
0.5000 mL | Freq: Once | INTRAMUSCULAR | Status: AC
  Administered 2012-10-01: 0.5 mL via INTRAMUSCULAR
  Filled 2012-09-30: qty 0.5

## 2012-09-30 MED ORDER — INSULIN GLARGINE 100 UNIT/ML ~~LOC~~ SOLN
15.0000 [IU] | Freq: Every day | SUBCUTANEOUS | Status: DC
  Administered 2012-09-30: 15 [IU] via SUBCUTANEOUS

## 2012-09-30 MED ORDER — ALBUTEROL SULFATE (5 MG/ML) 0.5% IN NEBU: 2.5000 mg | INHALATION_SOLUTION | Freq: Three times a day (TID) | RESPIRATORY_TRACT | Status: DC | PRN

## 2012-09-30 MED ORDER — POLYETHYLENE GLYCOL 3350 17 G PO PACK
17.0000 g | PACK | Freq: Every day | ORAL | Status: DC
  Administered 2012-09-30 – 2012-10-01 (×2): 17 g via ORAL
  Filled 2012-09-30 (×4): qty 1

## 2012-09-30 MED ORDER — FUROSEMIDE 10 MG/ML IJ SOLN
20.0000 mg | Freq: Once | INTRAMUSCULAR | Status: AC
  Administered 2012-09-30: 20 mg via INTRAVENOUS
  Filled 2012-09-30 (×2): qty 2

## 2012-09-30 MED ORDER — WARFARIN SODIUM 5 MG PO TABS
5.0000 mg | ORAL_TABLET | Freq: Once | ORAL | Status: AC
  Administered 2012-09-30: 5 mg via ORAL
  Filled 2012-09-30: qty 1

## 2012-09-30 MED ORDER — AMLODIPINE BESYLATE 5 MG PO TABS
5.0000 mg | ORAL_TABLET | Freq: Every day | ORAL | Status: DC
  Administered 2012-09-30 – 2012-10-02 (×3): 5 mg via ORAL
  Filled 2012-09-30 (×3): qty 1

## 2012-09-30 MED ORDER — ALBUTEROL SULFATE (5 MG/ML) 0.5% IN NEBU
2.5000 mg | INHALATION_SOLUTION | Freq: Two times a day (BID) | RESPIRATORY_TRACT | Status: DC | PRN
  Administered 2012-09-30 – 2012-10-01 (×3): 2.5 mg via RESPIRATORY_TRACT
  Filled 2012-09-30 (×3): qty 0.5

## 2012-09-30 NOTE — Progress Notes (Signed)
Pt arrived to 2600 via bed, O2, tele.  Pt daughter at bedside.  Salomon Mast, RN

## 2012-09-30 NOTE — Progress Notes (Signed)
Paged teaching service regarding pt neuro checks.  Orders were received to complete neuro checks q4hr.    Salomon Mast, RN

## 2012-09-30 NOTE — Progress Notes (Signed)
ANTICOAGULATION CONSULT NOTE - Follow Up Consult  Pharmacy Consult for coumadin  Indication: atrial fibrillation, CVA  Allergies  Allergen Reactions  . Metformin And Related Hives  . Penicillins Hives and Swelling    Patient Measurements: Height: 5\' 8"  (172.7 cm) Weight: 152 lb 12.5 oz (69.3 kg) IBW/kg (Calculated) : 68.4   Vital Signs: Temp: 97.5 F (36.4 C) (10/13 1250) Temp src: Oral (10/13 1250) BP: 164/90 mmHg (10/13 1400) Pulse Rate: 44  (10/13 1400)  Labs:  Basename 09/30/12 1233 09/30/12 0934 09/30/12 0715 09/29/12 2352 09/29/12 2210 09/29/12 0900 09/29/12 0640 09/28/12 0650  HGB -- 11.1* -- -- -- 11.0* -- --  HCT -- 35.3* -- -- -- 34.8* -- 36.3*  PLT -- 292 -- -- -- 321 -- 367  APTT -- -- -- -- -- -- -- --  LABPROT -- -- 18.0* -- -- -- 27.5* --  INR -- -- 1.54* -- -- -- 2.72* --  HEPARINUNFRC -- -- -- -- -- -- -- --  CREATININE -- 1.19 -- -- 1.32 1.43* -- --  CKTOTAL 60 -- 62 -- -- -- -- --  CKMB 5.6* -- 6.2* -- -- -- -- --  TROPONINI 4.46* -- 8.38* 6.63* -- -- -- --    Estimated Creatinine Clearance: 43.1 ml/min (by C-G formula based on Cr of 1.19).   Medications:  Scheduled:     . amLODipine  5 mg Oral Daily  . aspirin EC  81 mg Oral Daily  . atorvastatin  80 mg Oral q1800  . furosemide  20 mg Intravenous Once  . influenza  inactive virus vaccine  0.5 mL Intramuscular Once  . insulin aspart  0-20 Units Subcutaneous TID WC  . insulin glargine  15 Units Subcutaneous QHS  . metoprolol tartrate  75 mg Oral BID  . pantoprazole  40 mg Oral QHS  . polyethylene glycol  17 g Oral Daily  . potassium chloride  40 mEq Oral Q4H  . sodium chloride  500 mL Intravenous Once  . Warfarin - Pharmacist Dosing Inpatient   Does not apply q1800  . DISCONTD: heparin subcutaneous  5,000 Units Subcutaneous Q8H  . DISCONTD: insulin aspart  0-15 Units Subcutaneous TID WC  . DISCONTD: insulin glargine  12 Units Subcutaneous QHS  . DISCONTD: insulin glargine  15 Units  Subcutaneous QHS  . DISCONTD: metoprolol tartrate  25 mg Oral BID  . DISCONTD: pantoprazole (PROTONIX) IV  40 mg Intravenous QHS  . DISCONTD: piperacillin-tazobactam (ZOSYN)  IV  3.375 g Intravenous Q8H  . DISCONTD: potassium chloride  10 mEq Intravenous Q1 Hr x 4  . DISCONTD: vancomycin  1,000 mg Intravenous Q48H  . DISCONTD: vancomycin  1,000 mg Intravenous Q24H    Assessment: 78 yo male here with CVA and also noted with history of afib on coumadin PTA (2.5mg /day).  Patient was on Pradaxa but family prefers coumadin. INR today is 1.54 which is a big fall from yesterday.  Only two doses of Pradaxa given on 09/26/12 and would anticipate minimal effect on INR at this point.  He is without noted bleeding complications and his CBC is stable as is his platelets.  Goal of Therapy:  INR 2-3 Monitor platelets by anticoagulation protocol: Yes   Plan:  -Will give single dose of coumadin 5mg  tonight -Will follow PT/INR daily for now - Monitor for s/s of bleeding  Nadara Mustard, PharmD., MS Clinical Pharmacist Pager:  [phone] Thank you for allowing pharmacy to be part of this patients care team. 09/30/2012  2:16 PM

## 2012-09-30 NOTE — Progress Notes (Signed)
Subjective: Patient complaining of SOB at around 1900 yesterday evening. Was seen by IMTS for evaluation. Patient was reported to have had minimal chest discomfort associated with SOB yesterday, although he denies having had any chest pain. EKG was performed which did not reveal any acute ST or T-wave changes. Troponin was drawn at ~ 2200 and was 9.77. Cardiology consulted, who diagnosed patient with having an NSTEMI and are recommending medical management at this point, with possible lexiscan in a few days after patient's other active medical issues are stable.   [name] states he is having some SOB today, but that it is significantly improved overnight. He states that he has not had any difficulty urinating. Denies any nausea/vomiting yesterday, and states he tolerated his food without incident.    Objective: Vital signs in last 24 hours: Filed Vitals:   09/30/12 0200 09/30/12 0500 09/30/12 0600 09/30/12 1048  BP: 143/89  136/81 135/68  Pulse: 86  74 75  Temp: 98.1 F (36.7 C)  98.9 F (37.2 C) 98.2 F (36.8 C)  TempSrc: Oral  Oral Oral  Resp: 20  20 18   Height:      Weight:  152 lb 12.5 oz (69.3 kg)    SpO2: 100%  100% 100%   Weight change:   Intake/Output Summary (Last 24 hours) at 09/30/12 1052 Last data filed at 09/30/12 0600  Gross per 24 hour  Intake    240 ml  Output   1000 ml  Net   -760 ml   General: Vital signs reviewed and noted. Well-developed, well-nourished, in no acute distress; alert, appropriate and cooperative throughout examination.  HEENT: Normocephalic, atraumatic  Lungs: Mild bibasilar rales and minimal end-expiratory wheezing bilaterally.  Heart: RRR. S1 and S2 normal without gallop, murmur, or rubs.  Abdomen: Neuro: Asymmetric smile (weakness on R), CN II-XII otherwise intact; LUE, LLE, RLE all 5/5. RUE 3/5, with 1/5 strength in right hand.  Extremities: No pretibial edema in BLE. Mild edema in RUE compared to LUE. No TTP, erythema, increased warmth  in any extremity.    Lab Results: Basic Metabolic Panel:  Lab 09/29/12 1478 09/29/12 0900 09/26/12 0625  NA 130* 139 --  K 5.2* 3.4* --  CL 99 106 --  CO2 20 22 --  GLUCOSE 518* 64* --  BUN 32* 32* --  CREATININE 1.32 1.43* --  CALCIUM 8.5 8.9 --  MG -- -- 2.2  PHOS -- -- --   Liver Function Tests:  Lab 09/29/12 0900 09/28/12 1605  AST 54* 82*  ALT 19 22  ALKPHOS 104 115  BILITOT 0.7 0.5  PROT 6.5 6.9  ALBUMIN 2.6* 2.9*   CBC:  Lab 09/30/12 0934 09/29/12 0900 09/25/12 0945  WBC 11.7* 12.6* --  NEUTROABS -- 11.2* 8.4*  HGB 11.1* 11.0* --  HCT 35.3* 34.8* --  MCV 89.8 90.4 --  PLT 292 321 --   Cardiac Enzymes:  Lab 09/30/12 0715 09/29/12 2352 09/29/12 2210  CKTOTAL 62 -- --  CKMB 6.2* -- --  CKMBINDEX -- -- --  TROPONINI 8.38* 6.63* 9.77*   CBG:  Lab 09/30/12 0738 09/30/12 0355 09/29/12 2330 09/29/12 2200 09/29/12 2128 09/29/12 1718  GLUCAP 211* 189* 438* 529* 501* 262*   Hemoglobin A1C:  Lab 09/25/12 1403  HGBA1C 8.0*   Fasting Lipid Panel:  Lab 09/26/12 0625  CHOL 132  HDL 31*  LDLCALC 74  TRIG 295  CHOLHDL 4.3  LDLDIRECT --   Coagulation:  Lab 09/30/12 0715 09/29/12 0640 09/25/12  0930  LABPROT 18.0* 27.5* 16.4*  INR 1.54* 2.72* 1.35   Urinalysis:  Lab 09/27/12 1836  COLORURINE AMBER*  LABSPEC 1.020  PHURINE 5.0  GLUCOSEU 100*  HGBUR NEGATIVE  BILIRUBINUR LARGE*  KETONESUR 15*  PROTEINUR 100*  UROBILINOGEN 1.0  NITRITE NEGATIVE  LEUKOCYTESUR NEGATIVE   Studies/Results: Dg Chest 2 View  09/29/2012  *RADIOLOGY REPORT*  Clinical Data: Stroke.  History of cardiac valve replacement.  CHEST - 2 VIEW  Comparison: 09/27/2012  Findings: Since the prior study, the central vasculature and mild interstitial thickening has developed, greater on the right.  This suggests mild pulmonary edema in the proper clinical setting.  Lung base opacity has increased, most evident on the lateral view, most notably at the left lung base.   This may  reflect increased basilar atelectasis.  Infiltrate should be considered in the proper clinical setting.  Changes from cardiac surgery and valve replacement are stable.  No pneumothorax.  A small pleural effusion on the left is suggested.  Multiple compression fractures several treated with vertebroplasty are evident at the thoracolumbar junction.  These are stable.  IMPRESSION: Findings suggest mild congestive heart failure in the proper clinical setting.  More discrete lung base opacity, most notably in the left, is likely increased atelectasis.   Original Report Authenticated By: Domenic Moras, M.D.    Medications: I have reviewed the patient's current medications. Scheduled Meds:   . amLODipine  5 mg Oral Daily  . aspirin EC  81 mg Oral Daily  . atorvastatin  80 mg Oral q1800  . furosemide  20 mg Intravenous Once  . insulin aspart  0-15 Units Subcutaneous TID WC  . insulin glargine  12 Units Subcutaneous QHS  . metoprolol tartrate  75 mg Oral BID  . pantoprazole  40 mg Oral QHS  . polyethylene glycol  17 g Oral Daily  . potassium chloride  40 mEq Oral Q4H  . sodium chloride  500 mL Intravenous Once  . Warfarin - Pharmacist Dosing Inpatient   Does not apply q1800  . DISCONTD: heparin subcutaneous  5,000 Units Subcutaneous Q8H  . DISCONTD: insulin glargine  15 Units Subcutaneous QHS  . DISCONTD: metoprolol tartrate  25 mg Oral BID  . DISCONTD: pantoprazole (PROTONIX) IV  40 mg Intravenous QHS  . DISCONTD: piperacillin-tazobactam (ZOSYN)  IV  3.375 g Intravenous Q8H  . DISCONTD: potassium chloride  10 mEq Intravenous Q1 Hr x 4  . DISCONTD: vancomycin  1,000 mg Intravenous Q48H  . DISCONTD: vancomycin  1,000 mg Intravenous Q24H   Continuous Infusions:   . DISCONTD: sodium chloride 1,000 mL (09/29/12 0547)   PRN Meds:.acetaminophen, albuterol, bisacodyl, dextrose, ondansetron (ZOFRAN) IV, DISCONTD: albuterol Assessment/Plan: [name] is an 78 yo gentleman with PMH of HTN, DM, CHF,  CAD, a fib, PVD, and L BKA who was admitted for right-sided weakness and difficulty speaking secondary to an posterior L frontal acute ischemic stroke.   NSTEMI - Patient with SOB and troponin elevation yesterday evening, consistent with likely NSTEMI. He has a history of CHF exacerbations during previous hospitalizations when receiving IV fluids. CXR yesterday evening revealed findings consistent with mild CHF. No other signs/symptoms of CHF thus far during course. Serial troponins have been 9.77 -> 6.63 -> 8.38. IVF were discontinued yesterday. - lasix 20mg   - check proBNP - cardiology consulted and providing recs - BP/HR control per below  Ischemic stroke - Stable with no change in neurologic deficits. - cont dys 2 diet  - cont atorvastatin  80 mg  - 81mg  ASA daily (d/c rectal ASA 150mg /day)   Leukocytosis - Resolving, with WBC = 11.7 this AM. Spoke with ID attending regarding antibiotic therapy at this time--decision was reached to d/c antibiotics, as patient has no signs/symptoms concerning with infection and has no identifiable source at this time (CXR more concerning for acute CHF, particularly given clinical context).  1/2 blood cultures grew coagulase negative staph, most likely representative of specimen contamination. - d/c vanc, zosyn - continue daily CBC  Hypertension - BP mildly elevated yesterday evening. In setting of NSTEMI, BP and HR will be aggressively controlled, per cardiology recommendation. - increase metoprolol 25mg  -> 75mg  bid - start norvasc 5mg  qdaily  Diabetes mellitus - CBGs > 500 yesterday evening after increased appetite/food consumption, Was having CBGs ~40 earlier that AM while still not eating. Required increased aspart yesterday evening for CBG control. This AM CBGs ~  250-300.  - return to resistant SSI coverage - inc lantus 12U -> 15U qhs  Chronic A fib - [name] [facility]) adamant about pursuing warfarin anticoagulation rather than pradaxa.  -  warfarin per pharmacy (INR = 1.54 this AM) - patient's daughter (POA) willing to transition to pradaxa in 1-2 months if patient doing well   AKI/constrast-induced nephropathy - Resolved. Creatinine improved to 1.2 today. FeNa = 0.16%.   Back pain - TTP or L thoracic paraspinal musculature. Likely MSK in etiology. States heating pads helped significantly.  - xray thoracic spine   Dispo - in setting of NSTEMI and other active medical issues, patient will be transferred to stepdown unit for a higher level of care/attention.   LOS: 5 days   Joselito Fieldhouse R 09/30/2012, 10:52 AM

## 2012-09-30 NOTE — Progress Notes (Signed)
Orders to transfer to stepdown unit; nursing reported called to 2600 RN for bed 2602. Patient transported via bed with oxygen and cardiac monitor. Tolerated without distress.

## 2012-09-30 NOTE — Consult Note (Signed)
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   HPI: 78 yo man with a h/o CAD, AVR, s/p CABG, atrial fib and HTN who was admitted after a stroke in the setting of subtherapeutic coumadin. He had minimal chest discomfort yesterday p.m. And associated sob and was found to have an elevated troponin. We are referred for additional evaluation. The patient is currently sleeping but on awakening notes no chest pain. He denies sob.   PMH: Past Medical History  Diagnosis Date  . Hypertension   . Diabetes mellitus   . CHF (congestive heart failure)   . Coronary artery disease   . Atrial fibrillation   . PVD (peripheral vascular disease)   . S/P CABG x 1 2008  . S/P aortic valve replacement with porcine valve 2008    PSHX: Past Surgical History  Procedure Date  . Below knee leg amputation 07/2012  . Cataract extraction w/ intraocular lens implant   . Abdominal surgery   . Hernia repair     x5  . Back surgery     vertebroplasty  . Coronary artery bypass graft 2008    FAMHX:History reviewed. No pertinent family history.  Social History:  reports that he has quit smoking. He uses smokeless tobacco. He reports that he does not drink alcohol or use illicit drugs.  Allergies:  Allergies  Allergen Reactions  . Metformin And Related Hives  . Penicillins Hives and Swelling    Medications: reviewed  Dg Chest 2 View  09/29/2012  *RADIOLOGY REPORT*  Clinical Data: Stroke.  History of cardiac valve replacement.  CHEST - 2 VIEW  Comparison: 09/27/2012  Findings: Since the prior study, the central vasculature and mild interstitial thickening has developed, greater on the right.  This suggests mild pulmonary edema in the proper clinical setting.  Lung base opacity has increased, most evident on the lateral view, most notably at the left lung base.   This may reflect increased basilar atelectasis.  Infiltrate should be considered in the proper  clinical setting.  Changes from cardiac surgery and valve replacement are stable.  No pneumothorax.  A small pleural effusion on the left is suggested.  Multiple compression fractures several treated with vertebroplasty are evident at the thoracolumbar junction.  These are stable.  IMPRESSION: Findings suggest mild congestive heart failure in the proper clinical setting.  More discrete lung base opacity, most notably in the left, is likely increased atelectasis.   Original Report Authenticated By: Domenic Moras, M.D.     ROS  As stated in the HPI and negative for all other systems.  Physical Exam  Vitals:Blood pressure 143/89, pulse 86, temperature 98.1 F (36.7 C), temperature source Oral, resp. rate 20, height 5\' 8"  (1.727 m), weight 151 lb 14.4 oz (68.9 kg), SpO2 100.00%.  Well appearing elderly man, NAD HEENT: Unremarkable Neck:  No JVD, no thyromegally Lungs:  Clear with minimal basilar rales HEART:  IRegular rate rhythm, 1/6 systolic at RLSB murmur, no rubs, no clicks Abd:  soft, positive bowel sounds, no organomegally, no rebound, no guarding Ext:  Left bka and right leg with marked venous insufficiency Skin:  No rashes no nodules Neuro:  Could not cooperated due to somnolence  ECG - Atrial fib with RBBB, controlled with no acute STT changes Labs - reviewed, troponin elevated CXR - reviewed Assessment/Plan: 1.NSTEMI 2. Recent stroke, now on low dose pradaxa 3. Atrial fb 4. HTN 5. Known CAD with prior CABG Rec:  as he is asymptomatic at present and is contraindicated to have heparin, would continue current medical care with likely non-invasive evaluation. Would recommend Lexiscan myoview in a few days though this would only help assess risk. I do not think he is a good candidate for any invasive treatment at the present. Keep heart rate and blood pressure controlled.  Sharlot Gowda TaylorMD 09/30/2012, 3:54 AM

## 2012-10-01 ENCOUNTER — Encounter (HOSPITAL_COMMUNITY): Payer: Self-pay | Admitting: Radiation Oncology

## 2012-10-01 DIAGNOSIS — I509 Heart failure, unspecified: Secondary | ICD-10-CM

## 2012-10-01 LAB — GLUCOSE, CAPILLARY
Glucose-Capillary: 172 mg/dL — ABNORMAL HIGH (ref 70–99)
Glucose-Capillary: 196 mg/dL — ABNORMAL HIGH (ref 70–99)
Glucose-Capillary: 254 mg/dL — ABNORMAL HIGH (ref 70–99)

## 2012-10-01 LAB — CBC
MCH: 28.6 pg (ref 26.0–34.0)
MCHC: 32.3 g/dL (ref 30.0–36.0)
Platelets: 338 10*3/uL (ref 150–400)
RDW: 15.6 % — ABNORMAL HIGH (ref 11.5–15.5)

## 2012-10-01 LAB — BASIC METABOLIC PANEL
Calcium: 8.7 mg/dL (ref 8.4–10.5)
GFR calc non Af Amer: 56 mL/min — ABNORMAL LOW (ref >90)
Sodium: 135 mEq/L (ref 135–145)

## 2012-10-01 LAB — TROPONIN I
Troponin I: 2.44 ng/mL (ref <0.30)
Troponin I: 6.71 ng/mL (ref <0.30)

## 2012-10-01 LAB — CK TOTAL AND CKMB (NOT AT ARMC)
CK, MB: 3.9 ng/mL (ref 0.3–4.0)
CK, MB: 4 ng/mL (ref 0.3–4.0)
CK, MB: 3.1 ng/mL (ref 0.3–4.0)
Relative Index: INVALID (ref 0.0–2.5)

## 2012-10-01 MED ORDER — FUROSEMIDE 40 MG PO TABS
40.0000 mg | ORAL_TABLET | Freq: Two times a day (BID) | ORAL | Status: DC
  Administered 2012-10-01 – 2012-10-02 (×2): 40 mg via ORAL
  Filled 2012-10-01 (×4): qty 1

## 2012-10-01 MED ORDER — GUAIFENESIN 100 MG/5ML PO SYRP
100.0000 mg | ORAL_SOLUTION | ORAL | Status: DC | PRN
  Filled 2012-10-01: qty 118

## 2012-10-01 MED ORDER — FUROSEMIDE 10 MG/ML IJ SOLN
20.0000 mg | Freq: Once | INTRAMUSCULAR | Status: AC
  Administered 2012-10-01: 20 mg via INTRAVENOUS

## 2012-10-01 MED ORDER — FUROSEMIDE 10 MG/ML IJ SOLN
20.0000 mg | Freq: Once | INTRAMUSCULAR | Status: AC
  Administered 2012-10-01: 20 mg via INTRAVENOUS
  Filled 2012-10-01: qty 2

## 2012-10-01 MED ORDER — GUAIFENESIN 100 MG/5ML PO SOLN
5.0000 mL | ORAL | Status: DC | PRN
  Filled 2012-10-01 (×2): qty 5

## 2012-10-01 MED ORDER — INSULIN GLARGINE 100 UNIT/ML ~~LOC~~ SOLN
18.0000 [IU] | Freq: Every day | SUBCUTANEOUS | Status: DC
  Administered 2012-10-01 – 2012-10-02 (×2): 18 [IU] via SUBCUTANEOUS

## 2012-10-01 MED ORDER — ALENDRONATE SODIUM 10 MG PO TABS: 70.0000 mg | ORAL_TABLET | ORAL | Status: DC

## 2012-10-01 MED ORDER — CYCLOBENZAPRINE HCL 10 MG PO TABS: 5.0000 mg | ORAL_TABLET | Freq: Two times a day (BID) | ORAL | Status: DC | PRN

## 2012-10-01 MED ORDER — FUROSEMIDE 10 MG/ML IJ SOLN: 40.0000 mg | Freq: Two times a day (BID) | INTRAMUSCULAR | Status: DC

## 2012-10-01 MED ORDER — WARFARIN SODIUM 5 MG PO TABS
5.0000 mg | ORAL_TABLET | Freq: Once | ORAL | Status: AC
  Administered 2012-10-01: 5 mg via ORAL
  Filled 2012-10-01: qty 1

## 2012-10-01 NOTE — Progress Notes (Signed)
Medical Student Daily Progress Note   Subjective:    Interval Events:  Patient reports SOB this morning.  SpO2 this am was 91% on RA but later improved to 97%.  He did not sleep well overnight.  He denies chest pain, N/V, abdominal pain, or diarrhea.     Objective:    Vital Signs:   Temp:  [97.5 F (36.4 C)-98.5 F (36.9 C)] 97.8 F (36.6 C) (10/14 0500) Pulse Rate:  [44-99] 91  (10/14 0600) Resp:  [17-28] 20  (10/14 0600) BP: (135-164)/(61-95) 163/95 mmHg (10/14 0500) SpO2:  [91 %-100 %] 95 % (10/14 0600) Weight:  [69 kg (152 lb 1.9 oz)] 69 kg (152 lb 1.9 oz) (10/14 0500) Last BM Date: 09/30/12   Weights: 24-hour Weight change: -0.3 kg (-10.6 oz)  Filed Weights   09/27/12 0900 09/30/12 0500 10/01/12 0500  Weight: 68.9 kg (151 lb 14.4 oz) 69.3 kg (152 lb 12.5 oz) 69 kg (152 lb 1.9 oz)     Intake/Output:   Intake/Output Summary (Last 24 hours) at 10/01/12 0736 Last data filed at 10/01/12 1610  Gross per 24 hour  Intake    300 ml  Output   1075 ml  Net   -775 ml       Physical Exam: Vitals: T 97.8 P 98 R 28 BP 163/95 SpO2 91%  General: Pleasant, lying in bed in NAD.  Skin: Bruising on dorsum of hands. Darkening of skin on R LE.  Head: Normocephalic, atraumatic. Right-sided mouth droop, improved from presentation.  Eyes: PERRLA. EOMI. No scleral icterus.  Ears: Normal gross hearing bilaterally with hearing aids in place.  CV: Regular rate, irregular rhythm. No m/r/g. No carotid bruits.  Resp: Normal work of breathing. Diffuse expiratory wheezes bilaterally that improved after nebulizer treatment. Bibasilar crackles. Abd: Soft, non-tender, non-distended.  Ext: Radial pulses 2+ bilaterally. Left BKA stump is c/d/non-erythematous and non-tender.  Skin: No bedsores noted.  Neuro: Right-sided mouth/lower facial weakness improved from last week. CN II-XII otherwise intact bilaterally. RUE 4/5 strength  and R hand 0/5 strength. LUE 5/5 strength. RLE and LLE 5/5 strength.  Sensation normal and equal bilaterally. Abnormal finger-to-nose and RAM on the right secondary to weakness. Normal FTN and RAM on the left.  2+ patellar reflex bilaterally.  Labs: Basic Metabolic Panel:  Lab 09/30/12 9604 09/29/12 2210 09/29/12 0900 09/28/12 0650 09/27/12 1820 09/26/12 0625  NA 135 130* 139 139 138 --  K 5.1 5.2* 3.4* 4.4 4.6 --  CL 105 99 106 103 102 --  CO2 20 20 22 20 22  --  GLUCOSE 289* 518* 64* 121* 167* --  BUN 29* 32* 32* 41* 44* --  CREATININE 1.19 1.32 1.43* 2.16* 2.70* --  CALCIUM 8.5 8.5 8.9 -- -- --  MG -- -- -- -- -- 2.2  PHOS -- -- -- -- -- --    Liver Function Tests:  Lab 09/29/12 0900 09/28/12 1605 09/25/12 0945  AST 54* 82* 21  ALT 19 22 10   ALKPHOS 104 115 96  BILITOT 0.7 0.5 0.4  PROT 6.5 6.9 7.2  ALBUMIN 2.6* 2.9* 2.9*    CBC:  Lab 09/30/12 0934 09/29/12 0900 09/28/12 0650 09/27/12 1142 09/25/12 1122 09/25/12 0945  WBC 11.7* 12.6* 26.5* 22.4* -- 11.7*  NEUTROABS -- 11.2* -- -- -- 8.4*  HGB 11.1* 11.0* 11.6* 10.7* 13.9 --  HCT 35.3* 34.8* 36.3* 34.3* 41.0 --  MCV 89.8 90.4 89.4 90.3 -- 89.4  PLT 292 321 367 394 -- 360  Cardiac Enzymes:  Lab 10/13/12 2220 10-13-2012 1724 2012-10-13 1233 2012-10-13 0715 09/29/12 2352 09/29/12 2210  CKTOTAL 55 56 60 62 -- --  CKMB 4.4* 4.9* 5.6* 6.2* -- --  CKMBINDEX -- -- -- -- -- --  TROPONINI 5.28* -- 4.46* 8.38* 6.63* 9.77*    BNP (last 3 results):  Basename October 13, 2012 1233  PROBNP 29199.0*    CBG:  Lab 10-13-2012 2233 October 13, 2012 1714 10/13/2012 1119 Oct 13, 2012 0738 10-13-2012 0355  GLUCAP 254* 346* 251* 211* 189*    Coagulation Studies:  Basename 10/01/12 0530 Oct 13, 2012 0715 09/29/12 0640  LABPROT 16.7* 18.0* 27.5*  INR 1.39 1.54* 2.72*    Microbiology: Results for orders placed during the hospital encounter of 09/25/12  CULTURE, BLOOD (ROUTINE X 2)     Status: Normal (Preliminary result)   Collection Time   09/27/12 11:45 AM      Component Value Range Status Comment   Specimen Description  BLOOD RIGHT ARM   Final    Special Requests BOTTLES DRAWN AEROBIC AND ANAEROBIC 10CC   Final    Culture  Setup Time 09/27/2012 19:56   Final    Culture     Final    Value:        BLOOD CULTURE RECEIVED NO GROWTH TO DATE CULTURE WILL BE HELD FOR 5 DAYS BEFORE ISSUING A FINAL NEGATIVE REPORT   Report Status PENDING   Incomplete   CULTURE, BLOOD (ROUTINE X 2)     Status: Normal   Collection Time   09/27/12 11:50 AM      Component Value Range Status Comment   Specimen Description BLOOD LEFT HAND   Final    Special Requests BOTTLES DRAWN AEROBIC AND ANAEROBIC 10CC   Final    Culture  Setup Time 09/27/2012 19:56   Final    Culture     Final    Value: STAPHYLOCOCCUS SPECIES (COAGULASE NEGATIVE)     Note: THE SIGNIFICANCE OF ISOLATING THIS ORGANISM FROM A SINGLE SET OF BLOOD CULTURES WHEN MULTIPLE SETS ARE DRAWN IS UNCERTAIN. PLEASE NOTIFY THE MICROBIOLOGY DEPARTMENT WITHIN ONE WEEK IF SPECIATION AND SENSITIVITIES ARE REQUIRED.     Note: Gram Stain Report Called to,Read Back By and Verified With: MIKE GALLIGAN @1345  09/28/12 BY KRAWS   Report Status 09/29/2012 FINAL   Final   URINE CULTURE     Status: Normal   Collection Time   09/28/12 10:40 AM      Component Value Range Status Comment   Specimen Description URINE, RANDOM   Final    Special Requests NONE   Final    Culture  Setup Time 09/28/2012 11:24   Final    Colony Count NO GROWTH   Final    Culture NO GROWTH   Final    Report Status 09/29/2012 FINAL   Final   MRSA PCR SCREENING     Status: Normal   Collection Time   October 13, 2012  1:13 PM      Component Value Range Status Comment   MRSA by PCR NEGATIVE  NEGATIVE Final     Other results: EKG Results 10/12: Rate:  102 PR:  -- QRS:  144 ms QTc:  521 EKG: A fib with rvr; RBBB  Imaging: Dg Chest 2 View  09/29/2012  *RADIOLOGY REPORT*  Clinical Data: Stroke.  History of cardiac valve replacement.  CHEST - 2 VIEW  Comparison: 09/27/2012  Findings: Since the prior study, the central  vasculature and mild interstitial thickening has developed, greater on the right.  This suggests mild pulmonary edema in the proper clinical setting.  Lung base opacity has increased, most evident on the lateral view, most notably at the left lung base.   This may reflect increased basilar atelectasis.  Infiltrate should be considered in the proper clinical setting.  Changes from cardiac surgery and valve replacement are stable.  No pneumothorax.  A small pleural effusion on the left is suggested.  Multiple compression fractures several treated with vertebroplasty are evident at the thoracolumbar junction.  These are stable.  IMPRESSION: Findings suggest mild congestive heart failure in the proper clinical setting.  More discrete lung base opacity, most notably in the left, is likely increased atelectasis.   Original Report Authenticated By: Domenic Moras, M.D.    Dg Thoracic Spine 2 View  09/30/2012  *RADIOLOGY REPORT*  Clinical Data: Recent fall.  Back pain.  THORACIC SPINE - 2 VIEW  Comparison: 09/27/2012.  Findings: Vertebral augmentation is present from T11-L1.  There is a T10 compression fracture which was present on the prior radiograph 09/25/2012.  No other compression fractures of the thoracic spine are identified.  Partially visualized cervical spondylosis. Mid thoracic vertebral body height appears preserved.  IMPRESSION: T10 compression fracture is unchanged compared to 09/25/2012.  T11- L1 vertebral augmentation.   Original Report Authenticated By: Andreas Newport, M.D.       Medications:    Infusions:     Scheduled Medications:    . amLODipine  5 mg Oral Daily  . aspirin EC  81 mg Oral Daily  . atorvastatin  80 mg Oral q1800  . furosemide  20 mg Intravenous Once  . influenza  inactive virus vaccine  0.5 mL Intramuscular Once  . insulin aspart  0-20 Units Subcutaneous TID WC  . insulin glargine  15 Units Subcutaneous QHS  . metoprolol tartrate  75 mg Oral BID  . pantoprazole   40 mg Oral QHS  . polyethylene glycol  17 g Oral Daily  . sodium chloride  500 mL Intravenous Once  . warfarin  5 mg Oral ONCE-1800  . Warfarin - Pharmacist Dosing Inpatient   Does not apply q1800  . DISCONTD: insulin aspart  0-15 Units Subcutaneous TID WC  . DISCONTD: insulin glargine  12 Units Subcutaneous QHS  . DISCONTD: metoprolol tartrate  25 mg Oral BID     PRN Medications: acetaminophen, albuterol, bisacodyl, dextrose, ondansetron (ZOFRAN) IV, DISCONTD: albuterol   Assessment/ Plan:    Mr. Serpa is an 78 yo gentleman with multiple comorbidities, including HTN, DM, CHF, CAD, a fib, PVD, and L BKA with right-sided weakness and difficulty speaking secondary to an acute ischemic stroke, and he now has acute CHF exacerbation.   #CHF exacerbation Most likely due to CHF exacerbation due to fluid overload.  SpO2 91% this am, then improved to 97%.  Patient c/o SOB, denies chest pain.  On exam there are diffuse expiratory wheezes bilaterally.  proBNP 30,000.  Troponins were elevated to 9.77.  Most recent troponins: 5.28-->2.44-->6.71.   CXR is consistent with mild CHF.  Patient has a h/o CHF exacerbation secondary to fluid overload.  2D echo shows EF 65-70%, but patient could have diastolic dysfunction. IVF were d/c'd on Saturday.  Cards consulted, appreciate recs. --Lasix 40 mg IV this am.  Creatinine today 1.14.  Will transition to po 40 mg BID this pm. --per cards, trend troponins for another 24 hrs  #Hypertension  BPs have been 130-160s over 60-90s.  In setting of CHF exacerbation, BP and HR will  be aggressively controlled, per cardiology recommendation.  Home regimen: amlodipine 5 mg po BID, losartan 50 mg po BID, metoprolol 75 mg po BID.  --metoprolol 75 mg BID --amlodipine 5 mg daily  #Ischemic stroke  Stable. Patient is still experiencing right-sided UE weakness (improved from presentation) and dysarthria (improved from presentation) secondary to an ischemic stroke in the left  MCA territory that was confirmed by brain MRI.   CTA of head and neck reveal high-grade stenosis in the cavernous left internal carotid artery. Patient initially presented with RUE paralysis and complete expressive aphasia and/or dysarthria.  Patient is at high risk for an embolic or thrombotic stroke because he has chronic a fib and stopped his warfarin x 4 days, and he has CAD, hypertension, and DM. CHADS2 score is 6. 2D echo showed EF 65%, mild LVH, and calcified MV annulus. Neuro consulted, appreciate recs.  --Per speech, dysphagia 2 (find chop); thin liquid diet  --Frequent neuro checks q 4 hrs  --ASA 81 mg po once daily --Holding pradaxa. Per neuro and pharmacy, pradaxa 75 mg po BID for secondary stroke prevention. However, daughter (who is healthcare power of attorney) has decided that for now she prefers Warfarin for anticoagulation. She has discussed the risks and benefits with the primary team, neurologist, pharmacist, and her daughter-in-law (who is a pharmacy resident at [employer]  --Warfarin (started on 10/12) per pharmacy. INR today 1.39. --PM&R recommends inpatient rehabilitation. He will remain on our service for now. Consider transfer to inpatient rehab tomorrow or the next day once patient's SOB improves. --2D Echocardiogram shows EF 65-70%, mild LVH, MV with calcified annulus, and a bioprosthetic aortic valve  --Risk factor modification  --Telemetry monitoring  --atorvastatin 80 mg po once daily.  --HgbA1c 8%  --fasting lipid panel: chol 132 TG 135 HDL 31 LDL 74 VLDL 27 CHOL/HDL ratio 4.3   #Contrast-induced AKI  Resolved.  F/u creatinine today.  Most likely contrast-induced AKI.  FeNa 0.16%, suggesting pre-renal etiology. Per daughter the pt's baseline creatinine prior to amputation was 1.7-1.9. We considered obstruction as a cause, though  bladder scan showed 30 cc's post-void residual and renal US was negative for obstruction (though left kidney was not visualized due to  bowel). U/A: spec grav 1.020 pH 5 glucose 100, large bili, ketone 15, protein 100, negative nitrite/LE/Hgb; hyaline and granular casts.   --I/O cath if postvoid bladder scan > 300 cc's or if patient unable to void  --trend creatinine, especially in setting of Lasix tx  #Leukocytosis  Resolving.  F/u today's CBC.  WBC yesterday 11.7.  Spoke with ID attending regarding antibiotic therapy at this time--decision was reached to d/c antibiotics, as patient has no signs/symptoms concerning with infection and has no identifiable source at this time (CXR more concerning for acute CHF, particularly given clinical context). Clinically the patient does not show signs of infection: he is hypertensive (163/95), afebrile, his BKA stump is clean and dry, his peripheral IV is non-erythematous, and no bed sores were noted. 1/2 blood cultures grew coagulase negative staph, most likely representative of specimen contamination.  Patient received abx 10/10-10/14.  --yesterday d/c'd vanc, zosyn --f/u today's CBC  #Back pain  Improving, most likely musculoskeletal. On exam patient is tender to palpation on the left paravertebral region at lower thoracic/upper lumbar spine. Patient had two falls last week, the most recent on 10/3, when he slipped on a sock in the bathroom. He denies LOC or head trauma. Xray lumar spine shows compression fractures at L3 and L4  of indeterminate age whose location does not correlate with his pain. Xray thoracic spine shows T10 compression fracture that is unchanged from before.  We will continue conservative management at this time. --heating pad --lidocaine patch for pain --guaifenesin cough syrup since cough exacerbates back pain --avoid narcotics, as has h/o AMS with these --consulting IR for possible cement fixation of his stress fractures for pain relief   #Diabetes mellitus  CBGs 200-300s this am.  --resistant SSI coverage --lantus 15 U at bedtime  #Chronic A fib  INR today  1.39. --Holding pradaxa for now. Per neuro,started on pradaxa 150 mg BID on 10/9. He received 2 doses (last one on 10/9 at 2200). If we do re-start pradaxa, pharmacy recommends 75 mg BID due to decreased GFR. For now, daughter (who is healthcare power of attorney) wants him re-started on warfarin instead. --warfarin per pharmacy (5 mg once daily; INR = 1.39 this am)  --daughter willing to transition to pradaxa in 1-2 months if patient doing well  #FEN/GI  --dysphagia 2 diet --IVF d/c'd   #PPX  --heparin 5,000U Yetter  --protonix IV 40 mg once daily    This is a Psychologist, occupational Note.  The care of the patient was discussed with Dr. Lavena Bullion and the assessment and plan formulated with their assistance.  Please see their attached note or addendum for official documentation of the daily encounter.

## 2012-10-01 NOTE — Progress Notes (Signed)
I have seen and examined the patient, and agree with the the medical student assessment and plan outlined above. Please see my brief note below for additional details.   S:  Patient states his SOB is improving this AM. He states that overall he feels better than yesterday, however he had difficulty sleeping last night secondary to cough. Denies chest pain. Good O2 sats (97-98%) on RA this AM.   OBJECTIVE:  VS:  Reviewed   Meds:  Reviewed   Labs:  Reviewed   Imaging:  Reviewed    Physical Exam:  General:  Vital signs reviewed and noted. Well-developed, well-nourished, in no acute distress; alert, appropriate and cooperative throughout examination.   HEENT:  Normocephalic, atraumatic   Lungs:  Bibasilar rales with diffuse end-expiratory wheezes.   Heart:  Irregularly irregular. S1 and S2 normal without gallop, murmur, or rubs.   Abdomen:  BS normoactive. Soft, Nondistended, non-tender. No masses or organomegaly.   Extremities: Neuro:  1+ pretibial edema  4/5 strength in RUE, 1/5 strength in R hand. 5/5 strength in all other extremities. No 24hr interval changes. R facial droop persists.    ASSESSMENT/ PLAN:  Aaron [name] is an 78 yo gentleman with PMH of HTN, DM, CHF, CAD, a fib, PVD, and L BKA who was admitted for right-sided weakness and difficulty speaking secondary to an posterior L frontal acute ischemic stroke.   CHF exacerbation - Patient with SOB and troponin elevation on 10/12. He has a history of CHF exacerbations during previous hospitalizations when receiving IV fluids. CXR on 10/12 revealed findings consistent with mild CHF. ProBNP ~30,000. Troponins have fluctuated since initial troponin drawn on 10/12, with most recent troponin = 6.71 this AM, consistent with myocardial injury from his CHF exacerbation. Good UOP yesterday (0.6cc/kg/hr). Patient received 20mg  IV lasix yesterday afternoon, and has received 40mg  IV lasix this AM.  - cont to trend troponin (still elevated at this  time)  - IV lasix 40mg  bid  - cardiology consulted and providing recs (including myoview, which after discussion with cardiology today will be appropriate to pursue as outpatient)   Ischemic stroke - Stable with no change in neurologic deficits.  - cont dys 2 diet  - cont atorvastatin 80 mg  - 81mg  ASA daily (d/c rectal ASA 150mg /day)   Leukocytosis - Likely stress response in setting of acute stroke and CHF exacerbation. Still mildly elevated but stable with WBC = 13.5 this AM (11.7 on 10/13). 1/2 blood cultures grew coagulase negative staph, most likely representative of specimen contamination. Vanc/zosyn d/c'd on 10/13.  - continue daily CBC   Hypertension - Good BP control in interim since increasing metoprolol, restarting norvasc, and receiving 60mg  IV lasix.  - cont metoprolol 75mg  bid  - cont norvasc 5mg  qdaily   Diabetes mellitus - Better CBG control yesterday, averaging ~250, with one value of ~350 yesterday evening. Was as low as 40 and as high as ~500 the day prior. Further upward titration is warranted at this point as patient now eating regularly.  - continue resistant SSI coverage  - inc lantus 15U -> 18U qhs   Chronic A fib - [name] [facility]) adamant about pursuing warfarin anticoagulation rather than pradaxa.  - warfarin per pharmacy (INR = 1.39 this AM)  - patient's daughter (POA) willing to transition to pradaxa in 1-2 months if patient doing well   AKI/constrast-induced nephropathy - Resolved.   T10 compression fracture - Thoracic xray revealing T10 fracture which was read on interpretation of this  study as also being present on lumbar xray on 09/25/2012 (but was not commented on at that time). Possible this could be the source of patient's pain, as correlates with level of TTP. Will continue to treat with conservative measures at present, and will avoid bracing as it promotes immobility of the spine. If pain not improved in 3-4 weeks and is debilitating, further  intervention may be warranted (although not universally recommended)  - conservative management with heating pads, tylenol PRN (avoiding opioids as patient had AMS with fentanyl patch)   Dispo - patient will continue to be diuresed for his acute CHF exacerbation. Hopeful for discharge to inpatient rehabilitation in the next 48-72 hours if patient continues to improve, as he has no other acute medical issues at this time.   Length of Stay: 6   Elenor Legato, MD  PGY1, Internal Medicine Resident  10/01/2012, 11:30 AM

## 2012-10-01 NOTE — Progress Notes (Signed)
Rehab admissions - Continuing to follow.  Noted transfer to SDU over past few days.  Patient looks better today than last week.  He spoke to me and made a couple jokes.  Spoke with daughter at bedside.  Once he is medically stable, hope to admit to inpatient rehab later this week.  #[phone]

## 2012-10-01 NOTE — Progress Notes (Signed)
Speech Language Pathology Dysphagia Treatment Patient Details Name: Aaron [name] MRN: [id number] DOB: [birth year] Today's Date: 10/01/2012 Time: 7829-5621 SLP Time Calculation (min): 20 min  Assessment / Plan / Recommendation Clinical Impression  Treatment session addressed dysphagia goals; SLP facilitated session with trials of regular textures and thin liquids via cup with min assist verbal cues to carryover consistent use of chin tuck and multiple swallows. Patient demonstrated prolonged mastication of graham cracker; no overt s/s of aspiration were observed.  Recommend diet advancement to Dys.3 textures and continuation of thin liquids via cup with continued use of safe swallow strategies and full supervision for cuing.    Diet Recommendation  Initiate / Change Diet: Dysphagia 3 (mechanical soft);Thin liquid    SLP Plan Continue with current plan of care;Goals updated   Pertinent Vitals/Pain none   Swallowing Goals  SLP Swallowing Goals Swallow Study Goal #1 - Progress: Progressing toward goal Swallow Study Goal #2 - Progress: Progressing toward goal  General Temperature Spikes Noted: No Respiratory Status: Supplemental O2 delivered via (comment) (2 L) Behavior/Cognition: Alert;Cooperative;Pleasant mood;Requires cueing Oral Cavity - Dentition: Poor condition;Missing dentition Patient Positioning: Upright in bed  Oral Cavity - Oral Hygiene Does patient have any of the following "at risk" factors?: Oxygen therapy - cannula, mask, simple oxygen devices Brush patient's teeth BID with toothbrush (using toothpaste with fluoride): Yes Patient is AT RISK - Oral Care Protocol followed (see row info): Yes   Dysphagia Treatment Treatment focused on: Skilled observation of diet tolerance;Upgraded PO texture trials;Patient/family/caregiver education;Utilization of compensatory strategies Family/Caregiver Educated: Son-in-law present for session and educated on diet upgrade recommendations  and continued use of safe swallow compensatory strategies. Treatment Methods/Modalities: Skilled observation Patient observed directly with PO's: Yes Type of PO's observed: Regular;Thin liquids Feeding: Needs assist;Needs set up;Able to feed self Liquids provided via: Cup;No straw Oral Phase Signs & Symptoms: Prolonged mastication Pharyngeal Phase Signs & Symptoms: Multiple swallows (multi swallows secondary to use as a strategy.) Type of cueing: Verbal Amount of cueing: Minimal   Charlane Ferretti., CCC-SLP [phone]  Severino Paolo 10/01/2012, 3:39 PM

## 2012-10-01 NOTE — Progress Notes (Signed)
Physical Therapy Treatment Patient Details Name: Aaron [name] MRN: [id number] DOB: Nov 28, 1924 Today's Date: 10/01/2012 Time: 2952-8413 PT Time Calculation (min): 25 min  PT Assessment / Plan / Recommendation Comments on Treatment Session  Pt. s/p CVA with recent LBKA.  NSTEMI over weeekend.  Transferred to chair today but pt with SOB and difficulty completing transfer.  DOE 2/4.      Follow Up Recommendations  Post acute inpatient     Does the patient have the potential to tolerate intense rehabilitation  Yes, Recommend IP Rehab Screening  Barriers to Discharge        Equipment Recommendations  None recommended by PT    Recommendations for Other Services Rehab consult  Frequency Min 4X/week   Plan Discharge plan remains appropriate;Frequency remains appropriate    Precautions / Restrictions Precautions Precautions: Fall Restrictions Weight Bearing Restrictions: No   Pertinent Vitals/Pain VSS, No pain    Mobility  Bed Mobility Bed Mobility: Rolling Right;Right Sidelying to Sit;Rolling Left Rolling Right: 3: Mod assist;With rail Rolling Left: 3: Mod assist;With rail Right Sidelying to Sit: 3: Mod assist;HOB elevated Supine to Sit: Not tested (comment) Details for Bed Mobility Assistance: Pt. needed facilitation and verbal sequencing cues for trunk rotation, min to mod verbal and tactile cues for side to sit.  Pt had to be cleaned of stool prior to getting pt to EOB.   Transfers Transfers: Sit to Stand;Stand to Sit;Squat Pivot Transfers Sit to Stand: 1: +2 Total assist;With upper extremity assist;From bed Sit to Stand: Patient Percentage: 50% Stand to Sit: 1: +2 Total assist;With upper extremity assist;With armrests;To chair/3-in-1 Stand to Sit: Patient Percentage: 40% Squat Pivot Transfers: 1: +2 Total assist Squat Pivot Transfers: Patient Percentage: 50% Details for Transfer Assistance: Pt. needed cues and assist to bring his weight forward over his foot for pivot  transfer on RLE.  Pt. initiated movement however had difficulty midway through transfer with follow through of movement pattern.  Needs continued practice.  Ambulation/Gait Ambulation/Gait Assistance: Not tested (comment) Stairs: No Wheelchair Mobility Wheelchair Mobility: No Modified Rankin (Stroke Patients Only) Modified Rankin: Severe disability    PT Goals Acute Rehab PT Goals PT Goal: Supine/Side to Sit - Progress: Progressing toward goal PT Goal: Sit at Edge Of Bed - Progress: Progressing toward goal PT Goal: Sit to Stand - Progress: Progressing toward goal PT Goal: Stand to Sit - Progress: Progressing toward goal PT Transfer Goal: Bed to Chair/Chair to Bed - Progress: Progressing toward goal  Visit Information  Last PT Received On: 10/01/12 Assistance Needed: +2    Subjective Data  Subjective: "I just can't stand."   Cognition  Overall Cognitive Status: Appears within functional limits for tasks assessed/performed Arousal/Alertness: Awake/alert Orientation Level: Appears intact for tasks assessed Behavior During Session: [facility] for tasks performed    Balance  Static Sitting Balance Static Sitting - Balance Support: Bilateral upper extremity supported Static Sitting - Level of Assistance: 4: Min assist Static Sitting - Comment/# of Minutes: min tactile cues for tall posture (very slumped posteriorly rotated pelvis with kyphosis) Dynamic Sitting Balance Dynamic Sitting - Balance Support: Bilateral upper extremity supported Dynamic Sitting - Level of Assistance: 4: Min assist  End of Session PT - End of Session Equipment Utilized During Treatment: Gait belt Activity Tolerance: Patient tolerated treatment well Patient left: in chair;with call bell/phone within reach Nurse Communication: Mobility status       INGOLD,Margretta Zamorano 10/01/2012, 1:52 PM  [facility] [phone] (pager)

## 2012-10-01 NOTE — Progress Notes (Signed)
ANTICOAGULATION CONSULT NOTE - Follow Up Consult  Pharmacy Consult for coumadin  Indication: atrial fibrillation, CVA   Patient Measurements: Height: 5\' 8"  (172.7 cm) Weight: 152 lb 1.9 oz (69 kg) IBW/kg (Calculated) : 68.4   Vital Signs: Temp: 97.5 F (36.4 C) (10/14 0731) Temp src: Oral (10/14 0731) BP: 142/93 mmHg (10/14 0731) Pulse Rate: 87  (10/14 0731)  Labs:  Basename 10/01/12 0530 09/30/12 2220 09/30/12 1724 09/30/12 1233 09/30/12 0934 09/30/12 0715 09/29/12 2210 09/29/12 0900 09/29/12 0640  HGB -- -- -- -- 11.1* -- -- 11.0* --  HCT -- -- -- -- 35.3* -- -- 34.8* --  PLT -- -- -- -- 292 -- -- 321 --  APTT -- -- -- -- -- -- -- -- --  LABPROT 16.7* -- -- -- -- 18.0* -- -- 27.5*  INR 1.39 -- -- -- -- 1.54* -- -- 2.72*  HEPARINUNFRC -- -- -- -- -- -- -- -- --  CREATININE -- -- -- -- 1.19 -- 1.32 1.43* --  CKTOTAL -- 55 56 60 -- -- -- -- --  CKMB -- 4.4* 4.9* 5.6* -- -- -- -- --  TROPONINI -- 5.28* -- 4.46* -- 8.38* -- -- --    Estimated Creatinine Clearance: 43.1 ml/min (by C-G formula based on Cr of 1.19).   Medications:  Scheduled:     . amLODipine  5 mg Oral Daily  . aspirin EC  81 mg Oral Daily  . atorvastatin  80 mg Oral q1800  . furosemide  20 mg Intravenous Once  . influenza  inactive virus vaccine  0.5 mL Intramuscular Once  . insulin aspart  0-20 Units Subcutaneous TID WC  . insulin glargine  15 Units Subcutaneous QHS  . metoprolol tartrate  75 mg Oral BID  . pantoprazole  40 mg Oral QHS  . polyethylene glycol  17 g Oral Daily  . sodium chloride  500 mL Intravenous Once  . warfarin  5 mg Oral ONCE-1800  . Warfarin - Pharmacist Dosing Inpatient   Does not apply q1800  . DISCONTD: insulin aspart  0-15 Units Subcutaneous TID WC  . DISCONTD: insulin glargine  12 Units Subcutaneous QHS  . DISCONTD: metoprolol tartrate  25 mg Oral BID    Assessment: 78 yo male here with CVA and also noted with history of afib on coumadin PTA (2.5mg /day).  Patient was  on Pradaxa but family prefers coumadin. INR today is 1.39 which is decreased from yesterday.  Only two doses of Pradaxa given on 09/26/12 and would anticipate minimal effect on INR at this point.    Goal of Therapy:  INR 2-3 Monitor platelets by anticoagulation protocol: Yes   Plan:  -Will give single dose of coumadin 5mg  tonight -Will follow PT/INR daily for now - Monitor for s/s of bleeding  Celedonio Miyamoto, PharmD, BCPS Clinical Pharmacist Pager [phone]  Thank you for allowing pharmacy to be part of this patients care team. 10/01/2012 7:51 AM

## 2012-10-01 NOTE — Progress Notes (Signed)
Subjective:  No chest pain.  Mild expiratory wheezes this am. BP running mildly high.  Objective:  Vital Signs in the last 24 hours: Temp:  [97.5 F (36.4 C)-98.5 F (36.9 C)] 97.5 F (36.4 C) (10/14 0731) Pulse Rate:  [44-99] 87  (10/14 0731) Resp:  [16-28] 16  (10/14 0731) BP: (135-164)/(61-95) 142/93 mmHg (10/14 0731) SpO2:  [91 %-100 %] 100 % (10/14 0731) Weight:  [152 lb 1.9 oz (69 kg)] 152 lb 1.9 oz (69 kg) (10/14 0500)  Intake/Output from previous day: 10/13 0701 - 10/14 0700 In: 300 [P.O.:300] Out: 1075 [Urine:1075] Intake/Output from this shift:       . amLODipine  5 mg Oral Daily  . aspirin EC  81 mg Oral Daily  . atorvastatin  80 mg Oral q1800  . furosemide  20 mg Intravenous Once  . furosemide  20 mg Intravenous Once  . influenza  inactive virus vaccine  0.5 mL Intramuscular Once  . insulin aspart  0-20 Units Subcutaneous TID WC  . insulin glargine  15 Units Subcutaneous QHS  . metoprolol tartrate  75 mg Oral BID  . pantoprazole  40 mg Oral QHS  . polyethylene glycol  17 g Oral Daily  . sodium chloride  500 mL Intravenous Once  . warfarin  5 mg Oral ONCE-1800  . warfarin  5 mg Oral ONCE-1800  . Warfarin - Pharmacist Dosing Inpatient   Does not apply q1800  . DISCONTD: insulin aspart  0-15 Units Subcutaneous TID WC  . DISCONTD: insulin glargine  12 Units Subcutaneous QHS  . DISCONTD: metoprolol tartrate  25 mg Oral BID      Physical Exam: The patient appears to be in no distress.  Head and neck exam reveals that the pupils are equal and reactive.  The extraocular movements are full.  There is no scleral icterus.  Mouth and pharynx are benign.  No lymphadenopathy.  No carotid bruits.  The jugular venous pressure is normal.  Thyroid is not enlarged or tender.  Chest reveals mild expiratory wheezing.  Heart reveals no abnormal lift or heave.  First and second heart sounds are normal. There is grade 1/6 systolic murmur at LSE. There is no  gallop rub or  click. Rhythm is irregular.  The abdomen is soft and nontender.  Bowel sounds are normoactive.  There is no hepatosplenomegaly or mass.  There are no abdominal bruits.  Extremities reveal mild edema on right.  Left BKA.  Neurologic exam reveals right-sided weakness.  Integument reveals no rash  Lab Results:  Basename 09/30/12 0934 09/29/12 0900  WBC 11.7* 12.6*  HGB 11.1* 11.0*  PLT 292 321    Basename 09/30/12 0934 09/29/12 2210  NA 135 130*  K 5.1 5.2*  CL 105 99  CO2 20 20  GLUCOSE 289* 518*  BUN 29* 32*  CREATININE 1.19 1.32    Basename 09/30/12 2220 09/30/12 1233  TROPONINI 5.28* 4.46*   Hepatic Function Panel  Basename 09/29/12 0900 09/28/12 1605  PROT 6.5 --  ALBUMIN 2.6* --  AST 54* --  ALT 19 --  ALKPHOS 104 --  BILITOT 0.7 --  BILIDIR -- 0.2  IBILI -- 0.3   No results found for this basename: CHOL in the last 72 hours No results found for this basename: PROTIME in the last 72 hours  Imaging: Dg Chest 2 View  09/29/2012  *RADIOLOGY REPORT*  Clinical Data: Stroke.  History of cardiac valve replacement.  CHEST - 2 VIEW  Comparison:  09/27/2012  Findings: Since the prior study, the central vasculature and mild interstitial thickening has developed, greater on the right.  This suggests mild pulmonary edema in the proper clinical setting.  Lung base opacity has increased, most evident on the lateral view, most notably at the left lung base.   This may reflect increased basilar atelectasis.  Infiltrate should be considered in the proper clinical setting.  Changes from cardiac surgery and valve replacement are stable.  No pneumothorax.  A small pleural effusion on the left is suggested.  Multiple compression fractures several treated with vertebroplasty are evident at the thoracolumbar junction.  These are stable.  IMPRESSION: Findings suggest mild congestive heart failure in the proper clinical setting.  More discrete lung base opacity, most notably in the left, is  likely increased atelectasis.   Original Report Authenticated By: Domenic Moras, M.D.    Dg Thoracic Spine 2 View  09/30/2012  *RADIOLOGY REPORT*  Clinical Data: Recent fall.  Back pain.  THORACIC SPINE - 2 VIEW  Comparison: 09/27/2012.  Findings: Vertebral augmentation is present from T11-L1.  There is a T10 compression fracture which was present on the prior radiograph 09/25/2012.  No other compression fractures of the thoracic spine are identified.  Partially visualized cervical spondylosis. Mid thoracic vertebral body height appears preserved.  IMPRESSION: T10 compression fracture is unchanged compared to 09/25/2012.  T11- L1 vertebral augmentation.   Original Report Authenticated By: Andreas Newport, M.D.     Cardiac Studies: Telemetry shows atrial fib with rate 90-100 Assessment/Plan:   A-fib (09/25/2012)   Assessment: Continue warfarin and rate control     Aortic valve replaced (09/25/2012)   Assessment: Normal prosthetic function     NSTEMI (non-ST elevated myocardial infarction) (09/30/2012)   Assessment: Continue noninvasive approach.   Plan: consider lexiscan myoview in several days.   LOS: 6 days    Cassell Clement 10/01/2012, 8:15 AM

## 2012-10-01 NOTE — Progress Notes (Signed)
Internal Medicine Teaching Service Attending Dr.Teylor Wolven I have examined the patient at bedside today and discussed the management of the Patient with the resident team.Patient on diuresis for signs of fluid overload. Consulting IR for possible cement fixation of his stress fractures for pain relief. Topical lidocaine for pain relief till then.

## 2012-10-01 NOTE — Clinical Documentation Improvement (Signed)
CHF DOCUMENTATION CLARIFICATION QUERY  THIS DOCUMENT IS NOT A PERMANENT PART OF THE MEDICAL RECORD  Please update your documentation within the medical record to reflect your response to this query.                                                                                    10/01/12  Dear Dr.McTyre,  In a better effort to capture your patient's severity of illness, reflect appropriate length of stay and utilization of resources, a review of the patient medical record has revealed the following indicators the diagnosis of Heart Failure.   Based on your clinical judgment, please clarify and document in a progress note and/or discharge summary the clinical condition associated with the following supporting information: In responding to this query please exercise your independent judgment.  The fact that a query is asked, does not imply that any particular answer is desired or expected.    This patient has "CHF exacerbation" documented in the progress notes. If possible could you please help provide greater specificity for this patient in the progress note and discharge summary. THANK YOU!  BEST PRACTICE: The acuity and type of CHF should be documented when known [acute, chronic, acute on chronic, systolic, diastolic, systolic and diastolic].  Possible Clinical Conditions?  - Acute on Chronic Diastolic Congestive Heart Failure  - Acute on Chronic Systolic Congestive Heart Failure  - Other condition (please document in the progress notes and/or discharge summary)  - Cannot Clinically determine at this time   Supporting Information:  - "CHF exacerbation - Patient with SOB and troponin elevation on 10/12. He has a history of CHF exacerbations during previous hospitalizations when receiving IV fluids. CXR on 10/12 revealed findings consistent with mild CHF. ProBNP ~30,000. Troponins have fluctuated since initial troponin drawn on 10/12, with most recent troponin = 6.71 this AM,  consistent with myocardial injury from his CHF exacerbation. Good UOP yesterday (0.6cc/kg/hr). Patient received 20mg  IV lasix yesterday afternoon, and has received 40mg  IV lasix this AM.  - cont to trend troponin (still elevated at this time)  - IV lasix 40mg  bid  - cardiology consulted and providing recs (including myoview, which after discussion with cardiology today will be appropriate to pursue as outpatient)" progress note 10/14 McTyre  BNP    Component Value Date/Time   PROBNP 29199.0* 09/30/2012 1233   CXR 10/12: IMPRESSION: Findings suggest mild congestive heart failure in the proper clinical setting. More discrete lung base opacity, most notably in the left, is likely increased atelectasis.   - furosemide (LASIX) injection 20 mg Once ordered on 10/14 - furosemide (LASIX) injection 40 mg BID ordered on 10/14     No additional documentation in chart upon review. SM   Thank You,  Saul Fordyce  Clinical Documentation Specialist: [phone] Pager  Health Information Management 

## 2012-10-02 ENCOUNTER — Inpatient Hospital Stay (HOSPITAL_COMMUNITY): Payer: Medicare Other

## 2012-10-02 DIAGNOSIS — M4850XA Collapsed vertebra, not elsewhere classified, site unspecified, initial encounter for fracture: Secondary | ICD-10-CM | POA: Diagnosis present

## 2012-10-02 LAB — BASIC METABOLIC PANEL
BUN: 25 mg/dL — ABNORMAL HIGH (ref 6–23)
Chloride: 101 mEq/L (ref 96–112)
GFR calc non Af Amer: 59 mL/min — ABNORMAL LOW (ref >90)
Glucose, Bld: 120 mg/dL — ABNORMAL HIGH (ref 70–99)
Potassium: 4.2 mEq/L (ref 3.5–5.1)
Sodium: 136 mEq/L (ref 135–145)

## 2012-10-02 LAB — GLUCOSE, CAPILLARY
Glucose-Capillary: 111 mg/dL — ABNORMAL HIGH (ref 70–99)
Glucose-Capillary: 217 mg/dL — ABNORMAL HIGH (ref 70–99)
Glucose-Capillary: 213 mg/dL — ABNORMAL HIGH (ref 70–99)
Glucose-Capillary: 204 mg/dL — ABNORMAL HIGH (ref 70–99)

## 2012-10-02 LAB — CBC
HCT: 34.4 % — ABNORMAL LOW (ref 39.0–52.0)
Hemoglobin: 11 g/dL — ABNORMAL LOW (ref 13.0–17.0)
MCH: 28.2 pg (ref 26.0–34.0)
MCHC: 32 g/dL (ref 30.0–36.0)
RBC: 3.9 MIL/uL — ABNORMAL LOW (ref 4.22–5.81)

## 2012-10-02 LAB — CK TOTAL AND CKMB (NOT AT ARMC)
CK, MB: 3.2 ng/mL (ref 0.3–4.0)
Relative Index: INVALID (ref 0.0–2.5)
Relative Index: INVALID (ref 0.0–2.5)
Total CK: 48 U/L (ref 7–232)
Total CK: 45 U/L (ref 7–232)

## 2012-10-02 LAB — TROPONIN I: Troponin I: 4.55 ng/mL (ref <0.30)

## 2012-10-02 MED ORDER — LIDOCAINE 5 % EX PTCH
1.0000 | MEDICATED_PATCH | CUTANEOUS | Status: DC
  Filled 2012-10-02: qty 1

## 2012-10-02 MED ORDER — AMLODIPINE BESYLATE 10 MG PO TABS
10.0000 mg | ORAL_TABLET | Freq: Every day | ORAL | Status: DC
  Administered 2012-10-03: 10 mg via ORAL
  Filled 2012-10-02: qty 1

## 2012-10-02 MED ORDER — ATROPINE SULFATE 1 MG/ML IJ SOLN
INTRAMUSCULAR | Status: AC
  Filled 2012-10-02: qty 1

## 2012-10-02 MED ORDER — WARFARIN SODIUM 2.5 MG PO TABS
2.5000 mg | ORAL_TABLET | Freq: Once | ORAL | Status: DC
  Filled 2012-10-02: qty 1

## 2012-10-02 MED ORDER — ZOLPIDEM TARTRATE 5 MG PO TABS
5.0000 mg | ORAL_TABLET | Freq: Every evening | ORAL | Status: DC | PRN
  Administered 2012-10-02: 5 mg via ORAL
  Filled 2012-10-02: qty 1

## 2012-10-02 MED ORDER — ALBUTEROL SULFATE (5 MG/ML) 0.5% IN NEBU
2.5000 mg | INHALATION_SOLUTION | Freq: Three times a day (TID) | RESPIRATORY_TRACT | Status: DC | PRN
  Administered 2012-10-03: 2.5 mg via RESPIRATORY_TRACT
  Filled 2012-10-02: qty 0.5

## 2012-10-02 NOTE — Progress Notes (Signed)
Report called to May, 63 RN. Pt to transfer via bed, no O2, no IV. Belongings and family at bedside, VS stable upon transfer. Meds in chart, no current questions or complaints. Aaron [name]

## 2012-10-02 NOTE — Progress Notes (Signed)
Aware of request for Vertebroplasty Dr. Corliss Skains has reviewed plain film images and feels T10 compression fracture is likely amenable to augmentation. However, we will order formal CT T-Spine to asess and rule out retropulsion. Additionally, it is noted that the pt is on Coumadin for a.fib, receiving a dose last pm and with an INR this am. Coumadin will need to be held and INR will need to be at or below 1.5 before we can safely proceed. Would make pt NPO after MN in case INR is down tomorrow. Will follow along.

## 2012-10-02 NOTE — Progress Notes (Signed)
Internal Medicine Teaching Service Attending Dr.Huy Majid I have examined the patient at bedside today and discussed the management of the Patient with the resident team. Patient for inpatient rehab. He needs vertebroplasty by IR.

## 2012-10-02 NOTE — Progress Notes (Signed)
ANTICOAGULATION CONSULT NOTE - Follow Up Consult  Pharmacy Consult for coumadin  Indication: atrial fibrillation, CVA   Patient Measurements: Height: 5\' 8"  (172.7 cm) Weight: 150 lb 11.2 oz (68.357 kg) IBW/kg (Calculated) : 68.4   Vital Signs: Temp: 97.6 F (36.4 C) (10/15 0316) Temp src: Oral (10/15 0316) BP: 160/78 mmHg (10/15 0316) Pulse Rate: 77  (10/15 0014)  Labs:  Basename 10/02/12 0124 10/01/12 2136 10/01/12 1352 10/01/12 0754 10/01/12 0530 09/30/12 0934 09/30/12 0715  HGB 11.0* -- -- 11.8* -- -- --  HCT 34.4* -- -- 36.5* -- 35.3* --  PLT 319 -- -- 338 -- 292 --  APTT -- -- -- -- -- -- --  LABPROT 19.6* -- -- -- 16.7* -- 18.0*  INR 1.72* -- -- -- 1.39 -- 1.54*  HEPARINUNFRC -- -- -- -- -- -- --  CREATININE 1.09 -- -- 1.14 -- 1.19 --  CKTOTAL 44 48 54 -- -- -- --  CKMB 3.0 3.1 4.0 -- -- -- --  TROPONINI 4.55* 4.34* 4.75* -- -- -- --    Estimated Creatinine Clearance: 47.1 ml/min (by C-G formula based on Cr of 1.09).    Assessment: 78 yo male here with CVA and also noted with history of afib on coumadin PTA (2.5mg /day).  Patient was on Pradaxa but family prefers coumadin. INR today is up to 1.72 which is increased from yesterday.  Only two doses of Pradaxa given on 09/26/12 and would anticipate minimal effect on INR at this point.    Goal of Therapy:  INR 2-3 Monitor platelets by anticoagulation protocol: Yes   Plan:  -Will give single dose of coumadin 2.5mg  tonight  -Will follow PT/INR daily for now - Monitor for s/s of bleeding  Celedonio Miyamoto, PharmD, BCPS Clinical Pharmacist Pager [phone]  Thank you for allowing pharmacy to be part of this patients care team. 10/02/2012 7:32 AM

## 2012-10-02 NOTE — Progress Notes (Signed)
Occupational Therapy Treatment Patient Details Name: Symere Muise MRN: [id number] DOB: 18-Feb-1924 Today's Date: 10/02/2012 Time: 1478-2956 OT Time Calculation (min): 28 min  OT Assessment / Plan / Recommendation Comments on Treatment Session Pt appears to be making slow, steady gains. Feel CIR is an appropriate d/c plan.    Follow Up Recommendations  Inpatient Rehab    Barriers to Discharge       Equipment Recommendations  None recommended by OT    Recommendations for Other Services Rehab consult  Frequency Min 3X/week   Plan Discharge plan remains appropriate    Precautions / Restrictions Precautions Precautions: Fall Precaution Comments: LLE BKA Restrictions Weight Bearing Restrictions: No   Pertinent Vitals/Pain     ADL  Grooming: Performed;Brushing hair;Wash/dry face;Minimal assistance Where Assessed - Grooming: Unsupported sitting Lower Body Dressing: Performed;Moderate assistance Where Assessed - Lower Body Dressing: Unsupported sitting Toilet Transfer: +2 Total assistance Toilet Transfer: Patient Percentage: 60% Toilet Transfer Method:  (using sera plus) Toilet Transfer Equipment: Other (comment) (to recliner.) ADL Comments: Pt was able to A in donning shoe and adjusting sock. Pt does tend to lean posteriorly during unsupported sitting. Fatigues very quickly. Once pt was in standing in the sera plus, he was able to use his L hand very briefly to wash his face. Relies heavily LUE.    OT Diagnosis:    OT Problem List:   OT Treatment Interventions:     OT Goals ADL Goals ADL Goal: Grooming - Progress: Progressing toward goals ADL Goal: Lower Body Dressing - Progress: Progressing toward goals ADL Goal: Toilet Transfer - Progress: Progressing toward goals Miscellaneous OT Goals OT Goal: Miscellaneous Goal #1 - Progress: Progressing toward goals OT Goal: Miscellaneous Goal #2 - Progress: Progressing toward goals  Visit Information  Last OT Received On:  10/02/12 Assistance Needed: +2 PT/OT Co-Evaluation/Treatment: Yes    Subjective Data  Subjective: "I could use a bud light."   Prior Functioning       Cognition  Overall Cognitive Status: Appears within functional limits for tasks assessed/performed Arousal/Alertness: Awake/alert Orientation Level: Appears intact for tasks assessed Behavior During Session: [facility] for tasks performed    Mobility  Shoulder Instructions Bed Mobility Bed Mobility: Rolling Right;Right Sidelying to Sit;Sitting - Scoot to [place] of Bed Rolling Right: 3: Mod assist;With rail Rolling Left: Not tested (comment) Right Sidelying to Sit: 3: Mod assist;With rails;HOB elevated Supine to Sit: Not tested (comment) Sitting - Scoot to Edge of Bed: 3: Mod assist Details for Bed Mobility Assistance: Pt. needed facilitation and verbal sequencing cues for trunk rotation, min to mod verbal and tactile cues for side to sit.  Transfers Sit to Stand: 1: +2 Total assist;With upper extremity assist;From bed;With armrests Sit to Stand: Patient Percentage: 70% Stand to Sit: 1: +2 Total assist;With upper extremity assist;To chair/3-in-1;With armrests Stand to Sit: Patient Percentage: 70% Transfer via Lift Equipment: Kandee Keen Details for Transfer Assistance: Pt needed cues and assist to bring his weight forward over his foot for pivot transfer on RLE.  Used [place] Dec Plus to assist with this.  Pt. initiated movement and needed facilitation to complete movement.  Used [place] Dec Plus which worked nicely for sit to stand.  Pt. used UEs to pull self up more than using LE.         Exercises      Balance Static Sitting Balance Static Sitting - Balance Support: Bilateral upper extremity supported Static Sitting - Level of Assistance: 4: Min assist Static Sitting - Comment/# of Minutes: Continues to  slump posteriorly with kyphosis in sitting position causing most of weight to be posterior to body.   Dynamic Sitting Balance Dynamic Sitting -  Level of Assistance: Not tested (comment) Static Standing Balance Static Standing - Balance Support: Bilateral upper extremity supported;During functional activity Static Standing - Level of Assistance: 1: +2 Total assist Static Standing - Comment/# of Minutes: Stood in [place] Plus with footplate in place for 4 minutes with Tot A +2 (pt = 75%).  Pt. using L UE a lot to hold onto [facility] but even when asked to wash face, pt was able to still stand up on Sara Plus.  Pt. did not stand up as tall without L UE support and was leaning to right and posteriorly.  As soon as pt resumed his L hand on machine, he corrrected his posture and was standing fully upright and more at midline.     End of Session OT - End of Session Activity Tolerance: Patient tolerated treatment well Patient left: in chair;with call bell/phone within reach  GO     Latravia Southgate A OTR/L [phone] 10/02/2012, 12:49 PM

## 2012-10-02 NOTE — Progress Notes (Signed)
Resident Addendum to Medical Student Note   I have seen and examined the patient, and agree with the the medical student assessment and plan outlined above. Please see my progress note for 10/02/2012 for additional details.  Elenor Legato, MD PGY1, Internal Medicine Resident 10/02/2012, 12:30 PM

## 2012-10-02 NOTE — Progress Notes (Signed)
Inpatient Diabetes Program Recommendations  AACE/ADA: New Consensus Statement on Inpatient Glycemic Control (2013)  Target Ranges:  Prepandial:   less than 140 mg/dL      Peak postprandial:   less than 180 mg/dL (1-2 hours)      Critically ill patients:  140 - 180 mg/dL   Results for AUBERY, SAMEK (MRN [id number]) as of 10/02/2012 13:53  Ref. Range 10/01/2012 07:41 10/01/2012 12:50 10/01/2012 16:37 10/02/2012 08:21 10/02/2012 12:10  Glucose-Capillary Latest Range: 70-99 mg/dL 562 (H) 130 (H) 865 (H) 217 (H) 213 (H)    Inpatient Diabetes Program Recommendations Insulin - Basal: Lantus was increased on 10/14 @ 22:00 to Lantus 18 units at bedtime. Insulin - Meal Coverage: Please consider starting Novolog 4 units for meal coverage.  Note: Will continue to follow. Orlando Penner, RN, BSN, CCRN Diabetes Coordinator

## 2012-10-02 NOTE — Progress Notes (Signed)
10/02/12 1311  PT Visit Information  Last PT Received On 10/02/12  Assistance Needed +2  PT Time Calculation  PT Start Time 1109  PT Stop Time 1121  PT Time Calculation (min) 12 min  Subjective Data  Subjective "I need to go to the bathroom."  Precautions  Precautions Fall  Restrictions  Weight Bearing Restrictions No  Cognition  Overall Cognitive Status Appears within functional limits for tasks assessed/performed  Arousal/Alertness Awake/alert  Orientation Level Appears intact for tasks assessed  Behavior During Session [facility] for tasks performed  Bed Mobility  Bed Mobility Sit to Supine  Rolling Right Not tested (comment)  Rolling Left Not tested (comment)  Right Sidelying to Sit Not tested (comment)  Supine to Sit Not tested (comment)  Sitting - Scoot to Edge of Bed Not tested (comment)  Sit to Supine 4: Min guard  Transfers  Transfers Sit to Stand;Stand to Sit;Squat Pivot Transfers  Sit to Stand 1: +2 Total assist;With upper extremity assist;From chair/3-in-1  Sit to Stand: Patient Percentage 50%  Stand to Sit 1: +2 Total assist;With upper extremity assist;To chair/3-in-1  Stand to Sit: Patient Percentage 40%  Stand Pivot Transfers Not tested (comment)  Squat Pivot Transfers 1: +2 Total assist;With upper extremity assistance;With armrests  Squat Pivot Transfers: Patient Percentage 50%  Details for Transfer Assistance Pt needed cues and assist to bring weight forward over his foot for pivot transfer on RLE.  Pt could initiate movement but had difficulty pivoting once patient up on RLE to get from recliner to 3N1.  Did better getting from 3N1 to bed but still needed a lot of assist.  Pt. had to stand for a few minutes with TotA+2 (pt = 65%)to be cleaned of stool.    Ambulation/Gait  Ambulation/Gait Assistance Not tested (comment)  Stairs No  Wheelchair Mobility  Wheelchair Mobility No  PT - End of Session  Equipment Utilized During Treatment Gait belt  Activity Tolerance  Patient tolerated treatment well  Patient left in bed;with call bell/phone within reach;with family/visitor present  Nurse Communication Mobility status  PT - Assessment/Plan  Comments on Treatment Session Pt s/p CVA with recent LBKA.  NSTEMI over weekend.  Pt had difficulty with squat pivot transsfers to get to 3N1 and then to bed.  Fatigue may have played a factor.    PT Plan Discharge plan remains appropriate;Frequency remains appropriate  PT Frequency Min 4X/week  Recommendations for Other Services Rehab consult  Follow Up Recommendations Post acute inpatient  Does the patient have the potential to tolerate intense rehabilitation? Yes, Recommend IP Rehab Screening  Equipment Recommended None recommended by OT  Acute Rehab PT Goals  PT Goal: Sit to Stand - Progress Progressing toward goal  PT Goal: Stand to Sit - Progress Progressing toward goal  PT Transfer Goal: Bed to Chair/Chair to Bed - Progress Progressing toward goal  PT Goal: Stand - Progress Progressing toward goal  PT General Charges  $$ ACUTE PT VISIT 1 Procedure  PT Treatments  $Therapeutic Activity 8-22 mins   [facility] Acute Rehabilitation [phone] (pager)

## 2012-10-02 NOTE — Discharge Summary (Signed)
Patient Name:  Aaron [name] MRN: [id number]  PCP: No primary provider on file. DOB:  [birth year]         Date of Admission:  09/26/75  Date of Discharge:  10/03/2012     Attending Physician: Dr. Lars Mage, MD         DISCHARGE DIAGNOSES: Ischemic stroke CHF exacerbation T10 compression fracture Hypertension Diabetes mellitus Chronic A fib AKI/constrast-induced nephropathy Leukocytosis    DISPOSITION AND FOLLOW-UP: Habram Caler is to follow-up with the listed providers as detailed below, at which time, the following should be addressed:   1. F/u patient's recent hospitalization for ischemic stroke with Neurology 2. Follow up with Cardiology as an outpatient to address Warfarin vs Pradaxa for chronic Afib 3. Follow up with Endocrinology for Diabetes control 4. Follow up PCP as an outpatient for discussion about Vertebroplasty         Discharge Orders    Future Orders Please Complete By Expires   Diet - low sodium heart healthy      Diet Carb Modified      Increase activity slowly      warfarin (COUMADIN) per pharmacy consult          DISCHARGE MEDICATIONS:   Medication List     As of 10/03/2012 12:10 PM    STOP taking these medications         HYDROCODONE-ACETAMINOPHEN PO      losartan 50 MG tablet   Commonly known as: COZAAR      nystatin 100000 UNIT/GM Powd      simvastatin 40 MG tablet   Commonly known as: ZOCOR      warfarin 2.5 MG tablet   Commonly known as: COUMADIN      TAKE these medications         acetaminophen 325 MG tablet   Commonly known as: TYLENOL   Take 2 tablets (650 mg total) by mouth every 6 (six) hours as needed.      albuterol (5 MG/ML) 0.5% nebulizer solution   Commonly known as: PROVENTIL   Take 0.5 mLs (2.5 mg total) by nebulization 3 (three) times daily as needed for wheezing or shortness of breath.      amLODipine 10 MG tablet   Commonly known as: NORVASC   Take 1 tablet (10 mg total) by mouth daily.     aspirin 81 MG EC tablet   Take 1 tablet (81 mg total) by mouth daily.      atorvastatin 80 MG tablet   Commonly known as: LIPITOR   Take 1 tablet (80 mg total) by mouth daily at 6 PM.      bisacodyl 10 MG suppository   Commonly known as: DULCOLAX   Place 1 suppository (10 mg total) rectally daily as needed.      Calcium 600+D 600-400 MG-UNIT per tablet   Generic drug: Calcium Carbonate-Vitamin D   Take 1 tablet by mouth daily.      DULoxetine 30 MG capsule   Commonly known as: CYMBALTA   Take 30 mg by mouth at bedtime.      guaiFENesin 100 MG/5ML Soln   Commonly known as: ROBITUSSIN   Take 5 mLs (100 mg total) by mouth every 4 (four) hours as needed.      insulin aspart 100 UNIT/ML injection   Commonly known as: novoLOG   Inject 0-20 Units into the skin 3 (three) times daily with meals.      insulin glargine 100 UNIT/ML injection   Commonly  known as: LANTUS   Inject 18 Units into the skin at bedtime.      metoprolol 50 MG tablet   Commonly known as: LOPRESSOR   Take 75 mg by mouth 2 (two) times daily.      pantoprazole 40 MG tablet   Commonly known as: PROTONIX   Take 1 tablet (40 mg total) by mouth at bedtime.      polyethylene glycol packet   Commonly known as: MIRALAX / GLYCOLAX   Take 17 g by mouth daily.      torsemide 10 MG tablet   Commonly known as: DEMADEX   Take 10 mg by mouth 2 (two) times daily.      Vitamin D-3 1000 UNITS Caps   Take 1,000 Units by mouth at bedtime.      zolpidem 5 MG tablet   Commonly known as: AMBIEN   Take 1 tablet (5 mg total) by mouth at bedtime as needed for sleep.          CONSULTS:  Cardiology Neurology Inpatient rehabilitation  PROCEDURES PERFORMED:  Ct Angio Head W/cm &/or Wo Cm  09/25/2012  *RADIOLOGY REPORT*  Clinical Data:  Stroke.  Right-sided hemiparesis and unable to speak.  Last seen normal at 5 o'clock a.m.  CT ANGIOGRAPHY HEAD AND NECK  Technique:  Multidetector CT imaging of the head and neck was  performed using the standard protocol during bolus administration of intravenous contrast.  Multiplanar CT image reconstructions including MIPs were obtained to evaluate the vascular anatomy. Carotid stenosis measurements (when applicable) are obtained utilizing NASCET criteria, using the distal internal carotid diameter as the denominator.  Contrast: 50mL OMNIPAQUE IOHEXOL 350 MG/ML SOLN  Comparison:  CT head without contrast 09/25/2012.  CTA NECK  Findings:  A standard three-vessel arch configuration is present. The vertebral arteries both originate from the subclavian arteries. There is dense calcification at the origin of the nondominant right vertebral artery, suggesting high-grade stenosis.  There are tandem stenoses and C6-7 on the right and again at C5-6 on the right. There is high-grade stenosis at the dural margin of the right vertebral artery without significant contribution to the vertebral basilar junction.  Calcification at the origin of the dominant left vertebral artery is associated with mild stenosis of less than 50%.  There is some tortuosity in the remaining left vertebral artery without significant stenosis through the vertebral basilar junction.  The right to common carotid arteries within normal limits.  Dense atherosclerotic calcifications are as proximal right internal carotid artery without significant stenosis relative to the distal vessel.  The cervical ICA is tortuous without significant stenosis.  The left common carotid artery is within normal limits.  Dense calcifications are present at the origin of the proximal left internal carotid artery.  The minimal luminal diameters 2.4 mm. Compared with a distal measurement of 4.9 mm, there is no significant stenosis.  Moderate tortuosity is present in the more distal left internal carotid artery without significant stenosis to the skull base.  The soft tissues of the neck demonstrate no focal mucosal or submucosal lesions.  No significant  adenopathy is present.  Bone windows demonstrate moderate endplate degenerative changes at C3-4, C5-6, and C6-7.   Review of the MIP images confirms the above findings.  IMPRESSION:  1.  High-grade stenosis of the right vertebral artery at its origin with tandem stenoses at C6-7, C5-6, and at the dural margin.  The distal vertebral artery is essentially occluded. 2.  Mild narrowing of the proximal  left vertebral artery, dominant vessel without other significant stenoses through the neck. 3.  50% stenosis of the proximal left internal carotid artery. 4.  Atherosclerotic calcifications of the proximal right internal carotid artery without significant stenosis. 5.  Tortuosity of the internal carotid arteries bilaterally as well as the left vertebral artery, likely secondary to hypertension. 6.  Moderate spondylosis of the cervical spine.  CTA HEAD  Findings:  The postcontrast images of the head demonstrate no acute cortical infarct.  Atrophy and moderate white matter changes are again seen.  Remote subcortical white matter lacunar infarcts are noted within the left external capsule.  The postcontrast images demonstrate no pathologic enhancement.  Circumferential mucosal thickening is present in the left maxillary sinus.  There is a small fluid level in the left sphenoid sinus. The paranasal sinuses and mastoid air cells are clear.  Dense calcifications are present within the right cavernous carotid artery there is a focal stenosis of at least 50% in the supraclinoid right internal carotid artery, just proximal to the posterior communicating artery.  Similar dense calcifications are present throughout the left cavernous carotid artery with high-grade stenosis of the anterior genu there is a moderate tandem stenosis of the distal aspect of the calcifications.   Mild irregularity is present within the proximal M1 and A1 segments bilaterally without to high-grade stenosis.  The right anterior communicating artery is  patent.  The MCA bifurcations are within normal limits bilaterally.  There is significant attenuation of distal MCA branch vessels bilaterally without a significant proximal stenosis or occlusion.  The basilar artery is within normal limits.  The left P1 segment originates from the basilar tip.  The right posterior cerebral artery is of fetal type.  The distal PCA branch vessels demonstrate significant attenuation.  There is no significant proximal stenosis or occlusion.   Review of the MIP images confirms the above findings.  IMPRESSION:  1.  High-grade stenosis within the cavernous left internal carotid artery. 2.  Moderate supraclinoid internal carotid artery stenoses bilaterally. 3.  Mild to moderate irregularity of the proximal A1 and M1 segments bilaterally without significant stenosis. 4.  Moderate small vessel disease. 5.  Apart from the cavernous and supraclinoid left internal carotid artery stenoses, no other focal lesion is evident to explain the patient's symptoms.   Original Report Authenticated By: Jamesetta Orleans. MATTERN, M.D.    Dg Chest 2 View  09/29/2012  *RADIOLOGY REPORT*  Clinical Data: Stroke.  History of cardiac valve replacement.  CHEST - 2 VIEW  Comparison: 09/27/2012  Findings: Since the prior study, the central vasculature and mild interstitial thickening has developed, greater on the right.  This suggests mild pulmonary edema in the proper clinical setting.  Lung base opacity has increased, most evident on the lateral view, most notably at the left lung base.   This may reflect increased basilar atelectasis.  Infiltrate should be considered in the proper clinical setting.  Changes from cardiac surgery and valve replacement are stable.  No pneumothorax.  A small pleural effusion on the left is suggested.  Multiple compression fractures several treated with vertebroplasty are evident at the thoracolumbar junction.  These are stable.  IMPRESSION: Findings suggest mild congestive heart  failure in the proper clinical setting.  More discrete lung base opacity, most notably in the left, is likely increased atelectasis.   Original Report Authenticated By: Domenic Moras, M.D.    Dg Chest 2 View  09/27/2012  *RADIOLOGY REPORT*  Clinical Data: Rule out aspiration.  Stroke  CHEST - 2 VIEW  Comparison: None.  Findings: Prior CABG.  Aortic valve replacement.  Cement vertebral augmentation in the lumbar spine at three levels.  Mild bibasilar airspace disease with the appearance of atelectasis. No definite pneumonia or effusion.  Negative for heart failure.  IMPRESSION: Mild bibasilar airspace disease, most likely atelectasis.   Original Report Authenticated By: Camelia Phenes, M.D.    Dg Thoracic Spine 2 View  09/30/2012  *RADIOLOGY REPORT*  Clinical Data: Recent fall.  Back pain.  THORACIC SPINE - 2 VIEW  Comparison: 09/27/2012.  Findings: Vertebral augmentation is present from T11-L1.  There is a T10 compression fracture which was present on the prior radiograph 09/25/2012.  No other compression fractures of the thoracic spine are identified.  Partially visualized cervical spondylosis. Mid thoracic vertebral body height appears preserved.  IMPRESSION: T10 compression fracture is unchanged compared to 09/25/2012.  T11- L1 vertebral augmentation.   Original Report Authenticated By: Andreas Newport, M.D.    Dg Lumbar Spine Complete  09/25/2012  *RADIOLOGY REPORT*  Clinical Data: Back pain.  Recent fall.  LUMBAR SPINE - COMPLETE 4+ VIEW  Comparison: None  Findings: Prior cement vertebral augmentation of T11, T12, and L1 for  fracture treatment.  Moderate fracture of L3 is indeterminate in age.  Mild superior endplate fracture L4 of indeterminate age.  If the patient has acute pain in this area, consider MRI for further evaluation. Normal lumbar alignment.  Mild facet degeneration at L5-S1.  No pars defect.  2 cm right renal calculus compatible with a partial staghorn calculus  IMPRESSION: Cement  vertebral augmentation at T11, T12, and L1.  Compression fractures of L3 and L4 of indeterminate age.  2 cm partial staghorn calculus in the right renal pelvis.   Original Report Authenticated By: Camelia Phenes, M.D.    Ct Head Wo Contrast  09/27/2012  *RADIOLOGY REPORT*  Clinical Data: Right-sided weakness worsening this morning. Diabetic hypertensive.  Atrial fibrillation.  CT HEAD WITHOUT CONTRAST  Technique:  Contiguous axial images were obtained from the base of the skull through the vertex without contrast.  Comparison: 09/25/2012 MR and CT.  Findings: The posterior left frontal lobe acute infarct (motor strip) appears larger than on the prior MR.  There may have been interval extension of this infarct (as versus result of edema from expected evolution of the acute infarct causing the CT appearance). No associated hemorrhage.  No new findings otherwise noted.  Small vessel disease type changes.  Global atrophy without hydrocephalus.  Remote small cerebellar infarcts. No intracranial mass lesion detected on this unenhanced exam.  Vascular calcifications.  Mucosal thickening left maxillary sinus.  IMPRESSION:  The posterior left frontal lobe acute infarct appears larger than on the prior MR.  There may have been interval extension of this infarct (as versus result of edema from expected evolution of the acute infarct causing the CT appearance).  No associated hemorrhage.  This has been made a PRA call report utilizing dashboard call feature.   Original Report Authenticated By: Fuller Canada, M.D.    Ct Head Wo Contrast  09/25/2012  *RADIOLOGY REPORT*  Clinical Data: Sudden onset of right-sided weakness and facial droop.  CT HEAD WITHOUT CONTRAST  Technique:  Contiguous axial images were obtained from the base of the skull through the vertex without contrast.  Comparison: None.  Findings: No intracranial hemorrhage.  Small vessel disease type changes.  Remote right occipital lobe and right cerebellar  infarcts suspected without CT evidence of large acute  infarct.  Global atrophy without hydrocephalus.  No intracranial mass lesion detected on this unenhanced exam.  Prominent vascular calcifications.  Moderate mucosal thickening left maxillary sinus.  IMPRESSION: No intracranial hemorrhage.  Small vessel disease type changes.  Remote right occipital lobe and right cerebellar infarcts suspected without CT evidence of large acute infarct.  Critical Value/emergent results were called by telephone at the time of interpretation on 09/25/2012 at 9:40 a.m. to Dr. Amada Jupiter, who verbally acknowledged these results.   Original Report Authenticated By: Fuller Canada, M.D.    Ct Angio Neck W/cm &/or Wo/cm  09/25/2012  *RADIOLOGY REPORT*  Clinical Data:  Stroke.  Right-sided hemiparesis and unable to speak.  Last seen normal at 5 o'clock a.m.  CT ANGIOGRAPHY HEAD AND NECK  Technique:  Multidetector CT imaging of the head and neck was performed using the standard protocol during bolus administration of intravenous contrast.  Multiplanar CT image reconstructions including MIPs were obtained to evaluate the vascular anatomy. Carotid stenosis measurements (when applicable) are obtained utilizing NASCET criteria, using the distal internal carotid diameter as the denominator.  Contrast: 50mL OMNIPAQUE IOHEXOL 350 MG/ML SOLN  Comparison:  CT head without contrast 09/25/2012.  CTA NECK  Findings:  A standard three-vessel arch configuration is present. The vertebral arteries both originate from the subclavian arteries. There is dense calcification at the origin of the nondominant right vertebral artery, suggesting high-grade stenosis.  There are tandem stenoses and C6-7 on the right and again at C5-6 on the right. There is high-grade stenosis at the dural margin of the right vertebral artery without significant contribution to the vertebral basilar junction.  Calcification at the origin of the dominant left vertebral artery is  associated with mild stenosis of less than 50%.  There is some tortuosity in the remaining left vertebral artery without significant stenosis through the vertebral basilar junction.  The right to common carotid arteries within normal limits.  Dense atherosclerotic calcifications are as proximal right internal carotid artery without significant stenosis relative to the distal vessel.  The cervical ICA is tortuous without significant stenosis.  The left common carotid artery is within normal limits.  Dense calcifications are present at the origin of the proximal left internal carotid artery.  The minimal luminal diameters 2.4 mm. Compared with a distal measurement of 4.9 mm, there is no significant stenosis.  Moderate tortuosity is present in the more distal left internal carotid artery without significant stenosis to the skull base.  The soft tissues of the neck demonstrate no focal mucosal or submucosal lesions.  No significant adenopathy is present.  Bone windows demonstrate moderate endplate degenerative changes at C3-4, C5-6, and C6-7.   Review of the MIP images confirms the above findings.  IMPRESSION:  1.  High-grade stenosis of the right vertebral artery at its origin with tandem stenoses at C6-7, C5-6, and at the dural margin.  The distal vertebral artery is essentially occluded. 2.  Mild narrowing of the proximal left vertebral artery, dominant vessel without other significant stenoses through the neck. 3.  50% stenosis of the proximal left internal carotid artery. 4.  Atherosclerotic calcifications of the proximal right internal carotid artery without significant stenosis. 5.  Tortuosity of the internal carotid arteries bilaterally as well as the left vertebral artery, likely secondary to hypertension. 6.  Moderate spondylosis of the cervical spine.  CTA HEAD  Findings:  The postcontrast images of the head demonstrate no acute cortical infarct.  Atrophy and moderate white matter changes are again seen.  Remote subcortical white matter lacunar infarcts are noted within the left external capsule.  The postcontrast images demonstrate no pathologic enhancement.  Circumferential mucosal thickening is present in the left maxillary sinus.  There is a small fluid level in the left sphenoid sinus. The paranasal sinuses and mastoid air cells are clear.  Dense calcifications are present within the right cavernous carotid artery there is a focal stenosis of at least 50% in the supraclinoid right internal carotid artery, just proximal to the posterior communicating artery.  Similar dense calcifications are present throughout the left cavernous carotid artery with high-grade stenosis of the anterior genu there is a moderate tandem stenosis of the distal aspect of the calcifications.   Mild irregularity is present within the proximal M1 and A1 segments bilaterally without to high-grade stenosis.  The right anterior communicating artery is patent.  The MCA bifurcations are within normal limits bilaterally.  There is significant attenuation of distal MCA branch vessels bilaterally without a significant proximal stenosis or occlusion.  The basilar artery is within normal limits.  The left P1 segment originates from the basilar tip.  The right posterior cerebral artery is of fetal type.  The distal PCA branch vessels demonstrate significant attenuation.  There is no significant proximal stenosis or occlusion.   Review of the MIP images confirms the above findings.  IMPRESSION:  1.  High-grade stenosis within the cavernous left internal carotid artery. 2.  Moderate supraclinoid internal carotid artery stenoses bilaterally. 3.  Mild to moderate irregularity of the proximal A1 and M1 segments bilaterally without significant stenosis. 4.  Moderate small vessel disease. 5.  Apart from the cavernous and supraclinoid left internal carotid artery stenoses, no other focal lesion is evident to explain the patient's symptoms.   Original Report  Authenticated By: Jamesetta Orleans. MATTERN, M.D.    Mr Brain Wo Contrast  09/25/2012  *RADIOLOGY REPORT*  Clinical Data: Code stroke.  New onset of right-sided weakness.  MRI HEAD WITHOUT CONTRAST  Technique:  Multiplanar, multiecho pulse sequences of the brain and surrounding structures were obtained according to standard protocol without intravenous contrast.  Comparison: CTA head and neck 09/25/2012.  Findings: The diffusion weighted images demonstrate a focus of restricted effusion in the posterior left frontal lobe, along the central sulcus.  There is no significant associated T2 or FLAIR hyperintensity.  Moderate generalized atrophy is present.  Mild periventricular subcortical T2 and FLAIR hyperintensities are present bilaterally.  Flow is present in the major intracranial arteries.  Multiple remote lacunar infarcts are noted within the cerebellum bilaterally, worse on the right.  The mastoid air cells are clear.  A fluid levels present in the left maxillary sinus.  Circumferential mucosal thickening is also present in the left maxillary sinus.  Remaining paranasal sinuses are clear.  IMPRESSION:  1.  Acute non hemorrhagic infarct within the posterior left frontal lobe, along the central sulcus measures less than 2 cm. 2.  Atrophy and mild diffuse white matter disease likely reflects the sequelae of chronic microvascular ischemia. 3.  Remote lacunar infarcts of the cerebellum bilaterally. 4.  Acute left maxillary sinus disease.  These results were called by telephone on 09/25/2012 at 12:40 p.m. to Dr. Ignacia Palma, who verbally acknowledged these results.   Original Report Authenticated By: Jamesetta Orleans. MATTERN, M.D.    US Renal  09/27/2012  *RADIOLOGY REPORT*  Clinical Data: Acute renal failure  RENAL/URINARY TRACT ULTRASOUND COMPLETE  Comparison:  Lumbar spine radiographs 09/25/2012  Findings:  Right Kidney:  10.2 cm. No hydronephrosis  or focal mass.  Mildly suboptimal visualization due to adjacent bowel  gas and acoustic window.  Allowing for this, no echogenic shadowing calculus is identified within the right renal pelvis.  Left Kidney:  Not visualized due to bowel gas.  Bladder:  Bladder is decompressed with a Foley catheter in place.  IMPRESSION: The right kidney is within normal limits in appearance and no echogenic shadowing calculi identified.  It is possible this obscured by adjacent bowel or could have been passed into non visualized portion of the ureter.  No other abnormality is identified to explain the history of acute renal failure, but the left kidney is not visualized.  If there is strong clinical suspicion for renal calculus or other complication given findings on recent lumbar spine radiographs, consider CT abdomen/pelvis for further evaluation.   Original Report Authenticated By: Harrel Lemon, M.D.        ADMISSION DATA: H&P: Patient is a 78 y.o. male with a PMHx of HTN, DM, CHF, CAD, a fib, PVD, and L BKA, who presents to [facility] for evaluation of R-sided paresis, dysarthria. Patient was last noted to be normal at 0600 this AM by his home nurse. At 0800 he was found to have dysarthria and EMS was called for transport to the [facility] ED. Patient is on warfarin therapy for his atrial fibrillation, but after an INR of 3.1 approximately one week prior to admission, patient's PCP instructed the patient to not take his coumadin from 10/2-10/5, and was instructed to resume warfarin on 10/7, which he did.   [name] is present at time of admission, and provides much of history secondary to patient's dysarthria and aphasia. He had apparently been fully healthy prior to the events this AM, aside from a fall 4-5 days prior to admission, at which time patient believes he injured his back. Patient has 24-hr home health to assist him since he had a L BKA on 07/29/2012 of this year, but before this he was living independently and only received assistance with meal preparation.   At time of  admission, patient and daughter both agree that his symptoms of right-sided paresis and aphasia are substantially improved. R-sided paresis now limited to only the RUE per patient and daughter. Patient had profound aphasia this AM with no ability to communicate with family, but at time of admission he is able to communicate verbally to a limited extent, and answers basic questions appropriately.    Physical Exam: General:  Vital signs reviewed and noted. Lying in bed. Appears disheveled.    Head:  Normocephalic, atraumatic.   Eyes:  PERRL, EOMI, No signs of anemia or jaundice.   Nose:  Mucous membranes moist, not inflammed, nonerythematous.   Throat:  Oropharynx nonerythematous, no exudate appreciated.    Neck:  No deformities, masses, or tenderness noted.Supple, No carotid Bruits, no JVD.   Lungs:   Normal respiratory effort. Clear to auscultation BL without crackles or wheezes.   Heart:  Irregularly irregular. S1 and S2 normal.   Abdomen:   BS normoactive. Soft, Non-distended, non-tender.  No masses or organomegaly.   Extremities:  No pretibial edema.   Neurologic:  Orientation unable to be assessed 2/2 dysarthria/aphasia. Asymmetric smile with droop on L, CN II-XII otherwise intact. 5/5 strength in LUE, LLE, RLE.    Skin:    MSK:   LLE BKA with wrap clean, dry, intact. Skin hyperpigmentation on RLE, with small areas of ulceration.  TTP of L paraspinal musculature at level of ~T10-L2.  Labs: Basic Metabolic Panel:   Basename  09/25/12 1122  09/25/12 1001  09/25/12 0945   NA  137  138  --   K  3.8  3.8  --   CL  100  100  --   CO2  --  --  26   GLUCOSE  177*  185*  --   BUN  20  20  --   CREATININE  1.30  1.20  --   CALCIUM  --  --  9.1   MG  --  --  --   PHOS  --  --  --    Liver Function Tests:   Basename  09/25/12 0945   AST  21   ALT  10   ALKPHOS  96   BILITOT  0.4   PROT  7.2   ALBUMIN  2.9*    CBC:   Basename  09/25/12 1122  09/25/12 1001  09/25/12 0945     WBC  --  --  11.7*   NEUTROABS  --  --  8.4*   HGB  13.9  13.6  --   HCT  41.0  40.0  --   MCV  --  --  89.4   PLT  --  --  360    Cardiac Enzymes:   Basename  09/25/12 0945   CKTOTAL  --   CKMB  --   CKMBINDEX  --   TROPONINI  <0.30    CBG:   Basename  09/25/12 1806  09/25/12 1411  09/25/12 0954   GLUCAP  167*  172*  181*    Coagulation:   Basename  09/25/12 0930   LABPROT  16.4*   INR  1.35      HOSPITAL COURSE: Ischemic stroke - On admission patient had dysarthria and weakness in RUE (most notably R hand, 1/5, remainder of extremity 4/5), which were likely secondary to cardiac thrombus from subtherapeutic INR (per HPI). CT head negative for hemorrhage. MRI revealed acute non-hemorrhagic infarct within the posterior left frontal lobe. CTA of head and neck reveal high-grade stenosis in the cavernous left internal carotid artery. Neuro consulted and recommended pradaxa for anticoagulation for atrial fibrillation, and that the patient be restarted on daily aspirin. [name] Melina Modena) was uncomfortable with changing anticoagulation during course, however, so warfarin was restarted after patient received pradaxa on 10/9.   Symptoms of dysarthria/R hand weakness persisted throughout course. Due to worsening neuro exam on 10/10, patient went for a stat head CT that was negative for hemorrhage. Acute worsening in perceived RUE weakness likely was secondary to fentanyl patch used for patient's back pain. After fentanyl patch was discontinued, function returned to admission baseline.   At time of discharge patient: Right-sided mouth/lower facial weakness improved from last week. CN II-XII otherwise intact bilaterally. RUE 4/5 strength and R hand 1/5 strength. Patient can now move wrist and MCPs but still cannot move fingers. LUE 5/5 strength. RLE and LLE 5/5 strength. Sensation normal and equal bilaterally. Abnormal finger-to-nose and RAM on the right secondary to weakness. Normal  FTN and RAM on the left.    CHF exacerbation - Patient developed a CHF exacerbation with marked SOB that occurred after he was hydrated with IVF in the setting of his contrast-induced AKI,  Troponin was found to be elevated at ~9.7. Cardiology consulted, and recommendations per cards were to continue to control BP and HR control. Patient was diuresed initially with IV lasix, and then with PO lasix. Patient's  SOB began to resolve after initiation of diuresis. Patient will be discharged with home dose Torsemide 10 mg bid   T10 compression fracture - Patient has T10 compression fx on xray thoracic spine, and complains of pain at this level (also has TTP at ~T10). Patient and daughter state he had excellent response to prior vertebroplasty of T11, L1, and L2 vertebrae. IR consulted who was agreeable tor proceed with Vertebroplasty  INR was <1.5 but patient INR remained elevated even after d/c warfarin. Evaluating risk and benefits  for holding warfarin in the setting of chronic AFib the decision was made with the  Daughter [name] to not to proceed with intervention at this point.    Hypertension - Home meds initially held in setting of acute stroke, but were slowly reintroduced during course. Well-controlled overall.   Diabetes mellitus - Patient's CBGs difficult to control, with CBGs as high as ~500, and as low as ~40. Basal and SSI were thus titrated up/down at various times during course. Ultimately, after patient resumed PO intake, good glucose control was maintained with resistant SSI and lantus 18U qhs.   Chronic A fib - Ischemic stroke likely a result of cardiac thromboembolism in setting of subtherapeutic INR with warfarin anticoagulation (CHADS2 = 6). Neurology consulted, and recommended pradaxa as patient failed warfarin anticoagulation therapy. [name] Melina Modena) adamant about continuing to pursue warfarin anticoagulation rather than pradaxa. She was however willing to consider transitioning  to pradaxa in 1-2 months if patient doing well (wasn't comfortable with too much being changed in the acute setting). Warfarin reinitiated on 10/12. At time of discharge, INR 1.8   AKI/constrast-induced nephropathy - Cr increased from ~1.1 to 2.44 within 48 hours of admission.  FeNa = 0.16%. Patient had received contrast with CTA head/neck at time of admission. Cr peaked at 2.7 on 10/10, and began to decrease to baseline at ~72hrs post-contrast. As of 10/02/2012, patient's contrast-induced nephropathy was fully resolved, with Cr returning to admission baseline of 1.1 8.   Dispo: Patient was transferred to inpatient rehab.   DISCHARGE DATA: Vital Signs: BP 148/64  Pulse 91  Temp 97.1 F (36.2 C) (Oral)  Resp 20  Ht 5\' 8"  (1.727 m)  Wt 148 lb 13 oz (67.5 kg)  BMI 22.63 kg/m2  SpO2 97%  Labs: Results for orders placed during the hospital encounter of 09/25/12 (from the past 24 hour(s))  GLUCOSE, CAPILLARY     Status: Abnormal   Collection Time   10/02/12  4:18 PM      Component Value Range   Glucose-Capillary 204 (*) 70 - 99 mg/dL   Comment 1 Notify RN    CK TOTAL AND CKMB     Status: Normal   Collection Time   10/02/12  5:32 PM      Component Value Range   Total CK 45  7 - 232 U/L   CK, MB 3.2  0.3 - 4.0 ng/mL   Relative Index RELATIVE INDEX IS INVALID  0.0 - 2.5  GLUCOSE, CAPILLARY     Status: Abnormal   Collection Time   10/02/12  8:23 PM      Component Value Range   Glucose-Capillary 220 (*) 70 - 99 mg/dL   Comment 1 Notify RN     Comment 2 Documented in Chart    PROTIME-INR     Status: Abnormal   Collection Time   10/03/12  6:10 AM      Component Value Range   Prothrombin Time 20.3 (*) 11.6 -  15.2 seconds   INR 1.81 (*) 0.00 - 1.49  BASIC METABOLIC PANEL     Status: Abnormal   Collection Time   10/03/12  6:10 AM      Component Value Range   Sodium 133 (*) 135 - 145 mEq/L   Potassium 4.5  3.5 - 5.1 mEq/L   Chloride 96  96 - 112 mEq/L   CO2 28  19 - 32 mEq/L    Glucose, Bld 367 (*) 70 - 99 mg/dL   BUN 29 (*) 6 - 23 mg/dL   Creatinine, Ser 1.61  0.50 - 1.35 mg/dL   Calcium 8.5  8.4 - 09.6 mg/dL   GFR calc non Af Amer 54 (*) >90 mL/min   GFR calc Af Amer 63 (*) >90 mL/min  CBC     Status: Abnormal   Collection Time   10/03/12  6:10 AM      Component Value Range   WBC 11.5 (*) 4.0 - 10.5 K/uL   RBC 3.98 (*) 4.22 - 5.81 MIL/uL   Hemoglobin 11.3 (*) 13.0 - 17.0 g/dL   HCT 04.5 (*) 40.9 - 81.1 %   MCV 88.7  78.0 - 100.0 fL   MCH 28.4  26.0 - 34.0 pg   MCHC 32.0  30.0 - 36.0 g/dL   RDW 91.4 (*) 78.2 - 95.6 %   Platelets 355  150 - 400 K/uL  GLUCOSE, CAPILLARY     Status: Abnormal   Collection Time   10/03/12  6:31 AM      Component Value Range   Glucose-Capillary 372 (*) 70 - 99 mg/dL     Time spent on discharge: 40 min   Signed: Almyra Deforest, MD PGY I, Internal Medicine Resident 10/03/2012, 12:10 PM

## 2012-10-02 NOTE — Progress Notes (Signed)
Medical Student Daily Progress Note   Subjective:    Interval Events:  No acute events overnight.  Patient feels well this morning.  He c/o SOB when lying flat but otherwise denies SOB or chest pain.     Objective:    Vital Signs:   Temp:  [97.4 F (36.3 C)-99 F (37.2 C)] 97.9 F (36.6 C) (10/15 0730) Pulse Rate:  [75-97] 84  (10/15 0730) Resp:  [19-26] 19  (10/15 0730) BP: (126-172)/(71-98) 172/98 mmHg (10/15 0730) SpO2:  [98 %-100 %] 100 % (10/15 0730) Weight:  [68.357 kg (150 lb 11.2 oz)] 68.357 kg (150 lb 11.2 oz) (10/15 0500) Last BM Date: 09/30/12   Weights: 24-hour Weight change: -0.643 kg (-1 lb 6.7 oz)  Filed Weights   09/30/12 0500 10/01/12 0500 10/02/12 0500  Weight: 69.3 kg (152 lb 12.5 oz) 69 kg (152 lb 1.9 oz) 68.357 kg (150 lb 11.2 oz)     Intake/Output:   Intake/Output Summary (Last 24 hours) at 10/02/12 1013 Last data filed at 10/02/12 0937  Gross per 24 hour  Intake    820 ml  Output   1526 ml  Net   -706 ml       Physical Exam: Vitals: T 97.9 P 84 R 19 BP 172/98 SpO2 98% 2L [place] [facility]: Pleasant, lying in bed in NAD.  Skin: Bruising on dorsum of hands. Darkening of skin on R LE.  Head: Normocephalic, atraumatic. Right-sided mouth droop, improved from presentation.  Eyes: PERRLA. EOMI. No scleral icterus.  Ears: Normal gross hearing bilaterally with hearing aids in place.  CV: Regular rate, irregular rhythm. No m/r/g. No carotid bruits.  Resp: Normal work of breathing. Mild bibasilar crackles. Abd: Soft, non-tender, non-distended.  Ext: Radial pulses 2+ bilaterally. Left BKA stump is c/d/non-erythematous and non-tender.  Skin: No bedsores noted.  Neuro: Right-sided mouth/lower facial weakness improved from last week. CN II-XII otherwise intact bilaterally. RUE 4/5 strength and R hand 1/5 strength.  Patient can now move wrist and MCPs but still cannot move fingers. LUE 5/5 strength. RLE and LLE 5/5 strength. Sensation normal and equal  bilaterally. Abnormal finger-to-nose and RAM on the right secondary to weakness. Normal FTN and RAM on the left.   Labs: Basic Metabolic Panel:  Lab 10/02/12 1610 10/01/12 0754 09/30/12 0934 09/29/12 2210 09/29/12 0900 09/26/12 0625  NA 136 135 135 130* 139 --  K 4.2 4.7 5.1 5.2* 3.4* --  CL 101 102 105 99 106 --  CO2 28 26 20 20 22  --  GLUCOSE 120* 205* 289* 518* 64* --  BUN 25* 23 29* 32* 32* --  CREATININE 1.09 1.14 1.19 1.32 1.43* --  CALCIUM 8.5 8.7 8.5 -- -- --  MG -- -- -- -- -- 2.2  PHOS -- -- -- -- -- --    Liver Function Tests:  Lab 09/29/12 0900 09/28/12 1605  AST 54* 82*  ALT 19 22  ALKPHOS 104 115  BILITOT 0.7 0.5  PROT 6.5 6.9  ALBUMIN 2.6* 2.9*    CBC:  Lab 10/02/12 0124 10/01/12 0754 09/30/12 0934 09/29/12 0900 09/28/12 0650  WBC 10.5 13.5* 11.7* 12.6* 26.5*  NEUTROABS -- -- -- 11.2* --  HGB 11.0* 11.8* 11.1* 11.0* 11.6*  HCT 34.4* 36.5* 35.3* 34.8* 36.3*  MCV 88.2 88.4 89.8 90.4 89.4  PLT 319 338 292 321 367    Cardiac Enzymes:  Lab 10/02/12 0812 10/02/12 0124 10/01/12 2136 10/01/12 1352 10/01/12 0755 10/01/12 0530  CKTOTAL 48 44 48 54 --  77  CKMB 3.5 3.0 3.1 4.0 -- 3.9  CKMBINDEX -- -- -- -- -- --  TROPONINI -- 4.55* 4.34* 4.75* 6.71* 2.44*    BNP (last 3 results):  Basename 09/30/12 1233  PROBNP 29199.0*    CBG:  Lab 10/02/12 0821 10-28-12 1637 2012-10-28 1250 2012/10/28 0741 09/30/12 2233  GLUCAP 217* 254* 196* 172* 254*    Coagulation Studies:  Basename 10/02/12 0124 Oct 28, 2012 0530 09/30/12 0715  LABPROT 19.6* 16.7* 18.0*  INR 1.72* 1.39 1.54*    Microbiology: Results for orders placed during the hospital encounter of 09/25/12  CULTURE, BLOOD (ROUTINE X 2)     Status: Normal (Preliminary result)   Collection Time   09/27/12 11:45 AM      Component Value Range Status Comment   Specimen Description BLOOD RIGHT ARM   Final    Special Requests BOTTLES DRAWN AEROBIC AND ANAEROBIC 10CC   Final    Culture  Setup Time 09/27/2012  19:56   Final    Culture     Final    Value:        BLOOD CULTURE RECEIVED NO GROWTH TO DATE CULTURE WILL BE HELD FOR 5 DAYS BEFORE ISSUING A FINAL NEGATIVE REPORT   Report Status PENDING   Incomplete   CULTURE, BLOOD (ROUTINE X 2)     Status: Normal   Collection Time   09/27/12 11:50 AM      Component Value Range Status Comment   Specimen Description BLOOD LEFT HAND   Final    Special Requests BOTTLES DRAWN AEROBIC AND ANAEROBIC 10CC   Final    Culture  Setup Time 09/27/2012 19:56   Final    Culture     Final    Value: STAPHYLOCOCCUS SPECIES (COAGULASE NEGATIVE)     Note: THE SIGNIFICANCE OF ISOLATING THIS ORGANISM FROM A SINGLE SET OF BLOOD CULTURES WHEN MULTIPLE SETS ARE DRAWN IS UNCERTAIN. PLEASE NOTIFY THE MICROBIOLOGY DEPARTMENT WITHIN ONE WEEK IF SPECIATION AND SENSITIVITIES ARE REQUIRED.     Note: Gram Stain Report Called to,Read Back By and Verified With: MIKE GALLIGAN @1345  09/28/12 BY KRAWS   Report Status 09/29/2012 FINAL   Final   URINE CULTURE     Status: Normal   Collection Time   09/28/12 10:40 AM      Component Value Range Status Comment   Specimen Description URINE, RANDOM   Final    Special Requests NONE   Final    Culture  Setup Time 09/28/2012 11:24   Final    Colony Count NO GROWTH   Final    Culture NO GROWTH   Final    Report Status 09/29/2012 FINAL   Final   MRSA PCR SCREENING     Status: Normal   Collection Time   09/30/12  1:13 PM      Component Value Range Status Comment   MRSA by PCR NEGATIVE  NEGATIVE Final     Other results: EKG Results 10/12:  Rate: 102  PR: --  QRS: 144 ms  QTc: 521  EKG: A fib with rvr; RBBB  Imaging: Dg Thoracic Spine 2 View  09/30/2012  *RADIOLOGY REPORT*  Clinical Data: Recent fall.  Back pain.  THORACIC SPINE - 2 VIEW  Comparison: 09/27/2012.  Findings: Vertebral augmentation is present from T11-L1.  There is a T10 compression fracture which was present on the prior radiograph 09/25/2012.  No other compression  fractures of the thoracic spine are identified.  Partially visualized cervical spondylosis. Mid thoracic vertebral  body height appears preserved.  IMPRESSION: T10 compression fracture is unchanged compared to 09/25/2012.  T11- L1 vertebral augmentation.   Original Report Authenticated By: Andreas Newport, M.D.       Medications:    Infusions:     Scheduled Medications:    . amLODipine  5 mg Oral Daily  . aspirin EC  81 mg Oral Daily  . atorvastatin  80 mg Oral q1800  . furosemide  20 mg Intravenous Once  . furosemide  40 mg Oral BID  . influenza  inactive virus vaccine  0.5 mL Intramuscular Once  . insulin aspart  0-20 Units Subcutaneous TID WC  . insulin glargine  18 Units Subcutaneous QHS  . lidocaine  1 patch Transdermal Q24H  . metoprolol tartrate  75 mg Oral BID  . pantoprazole  40 mg Oral QHS  . polyethylene glycol  17 g Oral Daily  . sodium chloride  500 mL Intravenous Once  . warfarin  2.5 mg Oral ONCE-1800  . warfarin  5 mg Oral ONCE-1800  . Warfarin - Pharmacist Dosing Inpatient   Does not apply q1800  . DISCONTD: alendronate  70 mg Oral Weekly  . DISCONTD: furosemide  40 mg Intravenous BID  . DISCONTD: insulin glargine  15 Units Subcutaneous QHS     PRN Medications: acetaminophen, albuterol, bisacodyl, dextrose, guaiFENesin, ondansetron (ZOFRAN) IV, DISCONTD: cyclobenzaprine   Assessment/ Plan:    Mr. Saint is an 78 yo gentleman with multiple comorbidities, including HTN, DM, CHF, CAD, a fib, PVD, and L BKA with right-sided weakness and difficulty speaking secondary to an acute ischemic stroke, and he is now recovering from acute CHF exacerbation.   #CHF exacerbation  Patient's SOB over the weekend most likely due to CHF exacerbation due to fluid overload. SpO2 97% on 2L [place] this am. Patient c/o SOB only when lying flat, denies chest pain. On exam there are mild bibasilar crackles. proBNP 30,000. Most recent troponins 4.75->4.34->4.55.  Patient has a h/o CHF  exacerbation secondary to fluid overload. 2D echo shows EF 65-70%, but patient could have diastolic dysfunction. IVF were d/c'd on Saturday. Cards consulted, appreciate recs. --Creatinine today 1.09. Lasix 40 mg po BID.  --per cards, trend troponins for another 24 hrs  --albuterol nebulizer BID prn SOB/wheezing  #Hypertension  BPs have been 120-160s over 70-90s. In setting of CHF exacerbation, BP and HR will be aggressively controlled, per cardiology recommendation. Home regimen: amlodipine 5 mg po BID, losartan 50 mg po BID, metoprolol 75 mg po BID.  --metoprolol 75 mg BID  --amlodipine 10 mg daily   #Ischemic stroke  Stable. Patient is still experiencing right-sided UE weakness (improved from presentation) and dysarthria (improved from presentation) secondary to an ischemic stroke in the left MCA territory that was confirmed by brain MRI.  CTA of head and neck reveal high-grade stenosis in the cavernous left internal carotid artery. Patient initially presented with RUE paralysis and complete expressive aphasia and/or dysarthria. Patient is at high risk for an embolic or thrombotic stroke because he has chronic a fib and stopped his warfarin x 4 days, and he has CAD, hypertension, and DM. CHADS2 score is 6. 2D echo showed EF 65%, mild LVH, and calcified MV annulus. Neuro consulted, appreciate recs.  --Per speech, dysphagia 3 diet  --ASA 81 mg po once daily  --Holding pradaxa. Per neuro and pharmacy, pradaxa 75 mg po BID for secondary stroke prevention. However, daughter (who is healthcare power of attorney) has decided that for now she prefers Warfarin  for anticoagulation. She has discussed the risks and benefits with the primary team, neurologist, pharmacist, and her daughter-in-law (who is a pharmacy resident at [employer]  --Warfarin (started on 10/12) per pharmacy. INR today 1.72.  Will hold warfarin today for IR procedure tomorrow.  --PM&R recommends inpatient rehabilitation. He will remain on  our service for now. Consider transfer to inpatient rehab tomorrow after IR procedure. --2D Echocardiogram shows EF 65-70%, mild LVH, MV with calcified annulus, and a bioprosthetic aortic valve  --Risk factor modification  --Telemetry monitoring  --atorvastatin 80 mg po once daily.  --HgbA1c 8%  --fasting lipid panel: chol 132 TG 135 HDL 31 LDL 74 VLDL 27 CHOL/HDL ratio 4.3   #Contrast-induced AKI  Resolved. F/u creatinine today. Most likely contrast-induced AKI. FeNa 0.16%, suggesting pre-renal etiology. Per daughter the pt's baseline creatinine prior to amputation was 1.7-1.9. We considered obstruction as a cause, though bladder scan showed 30 cc's post-void residual and renal US was negative for obstruction (though left kidney was not visualized due to bowel). U/A: spec grav 1.020 pH 5 glucose 100, large bili, ketone 15, protein 100, negative nitrite/LE/Hgb; hyaline and granular casts.  --I/O cath if postvoid bladder scan > 300 cc's or if patient unable to void  --trend creatinine, especially in setting of Lasix tx   #Leukocytosis  Resolved. WBC today 10.5. Was most likely due to physiologic stress.  Spoke with ID attending regarding antibiotic therapy when his white count increased--decision was reached to d/c antibiotics, as patient has no signs/symptoms concerning with infection and has no identifiable source at this time (CXR more concerning for acute CHF, particularly given clinical context). Clinically the patient did not show signs of infection: he was normo-hypertensive, afebrile, his BKA stump was clean and dry, his peripheral IV was non-erythematous, and no bed sores were noted. 1/2 blood cultures grew coagulase negative staph, most likely representative of specimen contamination. Patient received abx 10/10-10/14.  --d/c'd vanc, zosyn on 10/13   #Back pain  Improving, most likely musculoskeletal. On exam patient is tender to palpation on the left paravertebral region at lower  thoracic/upper lumbar spine. Patient had two falls recently, the most recent on 10/3, when he slipped on a sock in the bathroom. He denies LOC or head trauma. Xray lumar spine shows compression fractures at L3 and L4 of indeterminate age whose location does not correlate with his pain. Xray thoracic spine shows T10 compression fracture that is unchanged from before. We will continue conservative management at this time.  --heating pad  --lidocaine patch for pain  --guaifenesin cough syrup since cough exacerbates back pain  --avoid narcotics, as has h/o AMS with these  --consulted IR for cement fixation of his stress fractures for pain relief.  Per their recs, hold warfarin now and if INR<1.5 they will do the procedure tomorrow morning.  If this is not performed tomorrow, patient can transfer to inpatient rehab and this can be re-addressed at a later date.  #Diabetes mellitus  CBGs 100-200s this am.  --resistant SSI coverage  --lantus 18 U at bedtime   #Chronic A fib  INR today 1.72.  --Holding pradaxa for now. Per neuro,started on pradaxa 150 mg BID on 10/9. He received 2 doses (last one on 10/9 at 2200). If we do re-start pradaxa, pharmacy recommends 75 mg BID due to decreased GFR. For now, daughter (who is healthcare power of attorney) wants him re-started on warfarin instead.  --warfarin per pharmacy (5 mg once daily; INR = 1.72 this am).  Holding now for possible cement fixation  Of thoracic compression fx tomorrow. --daughter willing to transition to pradaxa in 1-2 months if patient doing well   #FEN/GI  --dysphagia 3 diet  --IVF d/c'd   #PPX  --heparin 5,000U [place]  --protonix IV 40 mg once daily   This is a Psychologist, occupational Note.  The care of the patient was discussed with Dr. Lavena Bullion and the assessment and plan formulated with their assistance.  Please see their attached note or addendum for official documentation of the daily encounter.

## 2012-10-02 NOTE — Progress Notes (Signed)
Subjective: Patient states that SOB continues this AM, although mildly improved. [name] is present, who notes substantial interval improvement in his appearance and work of breathing. Continues to complain of back pain.   No acute interval events.  Objective: Vital signs in last 24 hours: Filed Vitals:   10/02/12 0316 10/02/12 0500 10/02/12 0730 10/02/12 0845  BP: 160/78  172/98 149/80  Pulse:   84 88  Temp: 97.6 F (36.4 C)  97.9 F (36.6 C)   TempSrc: Oral  Oral   Resp:   19 18  Height:      Weight:  150 lb 11.2 oz (68.357 kg)    SpO2:   100% 100%   Weight change: -1 lb 6.7 oz (-0.643 kg)  Intake/Output Summary (Last 24 hours) at 10/02/12 1105 Last data filed at 10/02/12 0937  Gross per 24 hour  Intake    820 ml  Output   1526 ml  Net   -706 ml   Physical Exam: General: Awake and alert; NAD CV: irregularly irregular; normal S1/S2 Resp: Bibasilar rales and scattered mild wheezes bilaterally GI/Abd: Soft, non-tender, non-distended; normoactive bowel sounds Ext: 1+ pretibial edema Neuro: 4/5 strength in RUE, with 1/5 strength in R hand; 5/5 LUE, LUE, RLE. Dysarthria persists.    Lab Results: Basic Metabolic Panel:  Lab 10/02/12 4098 10/01/12 0754 09/26/12 0625  NA 136 135 --  K 4.2 4.7 --  CL 101 102 --  CO2 28 26 --  GLUCOSE 120* 205* --  BUN 25* 23 --  CREATININE 1.09 1.14 --  CALCIUM 8.5 8.7 --  MG -- -- 2.2  PHOS -- -- --   Liver Function Tests:  Lab 09/29/12 0900 09/28/12 1605  AST 54* 82*  ALT 19 22  ALKPHOS 104 115  BILITOT 0.7 0.5  PROT 6.5 6.9  ALBUMIN 2.6* 2.9*   CBC:  Lab 10/02/12 0124 10/01/12 0754 09/29/12 0900  WBC 10.5 13.5* --  NEUTROABS -- -- 11.2*  HGB 11.0* 11.8* --  HCT 34.4* 36.5* --  MCV 88.2 88.4 --  PLT 319 338 --   Cardiac Enzymes:  Lab 10/02/12 0812 10/02/12 0124 10/01/12 2136 10/01/12 1352  CKTOTAL 48 44 48 --  CKMB 3.5 3.0 3.1 --  CKMBINDEX -- -- -- --  TROPONINI -- 4.55* 4.34* 4.75*   BNP:  Lab  09/30/12 1233  PROBNP 29199.0*   CBG:  Lab 10/02/12 0821 10/01/12 1637 10/01/12 1250 10/01/12 0741 09/30/12 2233 09/30/12 1714  GLUCAP 217* 254* 196* 172* 254* 346*   Hemoglobin A1C:  Lab 09/25/12 1403  HGBA1C 8.0*   Fasting Lipid Panel:  Lab 09/26/12 0625  CHOL 132  HDL 31*  LDLCALC 74  TRIG 119  CHOLHDL 4.3  LDLDIRECT --   Coagulation:  Lab 10/02/12 0124 10/01/12 0530 09/30/12 0715 09/29/12 0640  LABPROT 19.6* 16.7* 18.0* 27.5*  INR 1.72* 1.39 1.54* 2.72*   Urinalysis:  Lab 09/27/12 1836  COLORURINE AMBER*  LABSPEC 1.020  PHURINE 5.0  GLUCOSEU 100*  HGBUR NEGATIVE  BILIRUBINUR LARGE*  KETONESUR 15*  PROTEINUR 100*  UROBILINOGEN 1.0  NITRITE NEGATIVE  LEUKOCYTESUR NEGATIVE   Micro Results: Recent Results (from the past 240 hour(s))  CULTURE, BLOOD (ROUTINE X 2)     Status: Normal (Preliminary result)   Collection Time   09/27/12 11:45 AM      Component Value Range Status Comment   Specimen Description BLOOD RIGHT ARM   Final    Special Requests BOTTLES DRAWN AEROBIC AND ANAEROBIC  10CC   Final    Culture  Setup Time 09/27/2012 19:56   Final    Culture     Final    Value:        BLOOD CULTURE RECEIVED NO GROWTH TO DATE CULTURE WILL BE HELD FOR 5 DAYS BEFORE ISSUING A FINAL NEGATIVE REPORT   Report Status PENDING   Incomplete   CULTURE, BLOOD (ROUTINE X 2)     Status: Normal   Collection Time   09/27/12 11:50 AM      Component Value Range Status Comment   Specimen Description BLOOD LEFT HAND   Final    Special Requests BOTTLES DRAWN AEROBIC AND ANAEROBIC 10CC   Final    Culture  Setup Time 09/27/2012 19:56   Final    Culture     Final    Value: STAPHYLOCOCCUS SPECIES (COAGULASE NEGATIVE)     Note: THE SIGNIFICANCE OF ISOLATING THIS ORGANISM FROM A SINGLE SET OF BLOOD CULTURES WHEN MULTIPLE SETS ARE DRAWN IS UNCERTAIN. PLEASE NOTIFY THE MICROBIOLOGY DEPARTMENT WITHIN ONE WEEK IF SPECIATION AND SENSITIVITIES ARE REQUIRED.     Note: Gram Stain Report  Called to,Read Back By and Verified With: MIKE GALLIGAN @1345  09/28/12 BY KRAWS   Report Status 09/29/2012 FINAL   Final   URINE CULTURE     Status: Normal   Collection Time   09/28/12 10:40 AM      Component Value Range Status Comment   Specimen Description URINE, RANDOM   Final    Special Requests NONE   Final    Culture  Setup Time 09/28/2012 11:24   Final    Colony Count NO GROWTH   Final    Culture NO GROWTH   Final    Report Status 09/29/2012 FINAL   Final   MRSA PCR SCREENING     Status: Normal   Collection Time   09/30/12  1:13 PM      Component Value Range Status Comment   MRSA by PCR NEGATIVE  NEGATIVE Final    Studies/Results: Dg Thoracic Spine 2 View  09/30/2012  *RADIOLOGY REPORT*  Clinical Data: Recent fall.  Back pain.  THORACIC SPINE - 2 VIEW  Comparison: 09/27/2012.  Findings: Vertebral augmentation is present from T11-L1.  There is a T10 compression fracture which was present on the prior radiograph 09/25/2012.  No other compression fractures of the thoracic spine are identified.  Partially visualized cervical spondylosis. Mid thoracic vertebral body height appears preserved.  IMPRESSION: T10 compression fracture is unchanged compared to 09/25/2012.  T11- L1 vertebral augmentation.   Original Report Authenticated By: Andreas Newport, M.D.    Medications: I have reviewed the patient's current medications. Scheduled Meds:   . amLODipine  5 mg Oral Daily  . aspirin EC  81 mg Oral Daily  . atorvastatin  80 mg Oral q1800  . influenza  inactive virus vaccine  0.5 mL Intramuscular Once  . insulin aspart  0-20 Units Subcutaneous TID WC  . insulin glargine  18 Units Subcutaneous QHS  . metoprolol tartrate  75 mg Oral BID  . pantoprazole  40 mg Oral QHS  . polyethylene glycol  17 g Oral Daily  . sodium chloride  500 mL Intravenous Once  . warfarin  2.5 mg Oral ONCE-1800  . warfarin  5 mg Oral ONCE-1800  . Warfarin - Pharmacist Dosing Inpatient   Does not apply q1800  .  DISCONTD: alendronate  70 mg Oral Weekly  . DISCONTD: furosemide  40 mg Intravenous  BID  . DISCONTD: furosemide  40 mg Oral BID  . DISCONTD: insulin glargine  15 Units Subcutaneous QHS  . DISCONTD: lidocaine  1 patch Transdermal Q24H   Continuous Infusions:  PRN Meds:.acetaminophen, albuterol, bisacodyl, dextrose, guaiFENesin, ondansetron (ZOFRAN) IV, DISCONTD: cyclobenzaprine Assessment/Plan: Mr. Plett is an 78 yo gentleman with PMH of HTN, DM, CHF, CAD, a fib, PVD, and L BKA who was admitted for right-sided weakness and difficulty speaking secondary to an posterior L frontal acute ischemic stroke.   CHF exacerbation -  Good UOP yesterday (1.1cc/kg/hr), which is improved over previous day (0.6cc/kg/hr). SOB persists but is improving. Patient transitioned from IV to PO lasix yesterday.  - PO lasix 40mg  bid  - consider outpatient myoview (as pt had troponin elevations)  Ischemic stroke - Stable with no change in neurologic deficits.  - NPO while awaiting possible vertebroplasty (per below) - cont atorvastatin 80 mg  - 81mg  ASA daily (d/c rectal ASA 150mg /day)   T10 compression fracture - Patient has T10 compression fx on xray thoracic spine, and complains of pain at this level (also has TTP at ~T10). Patient and daughter state he had excellent response to prior vertebroplasty of T11, L1, and L2 vertebrae, and wish to pursue vertebroplasty for the T10 vertebra at this time. Patient also has compression fx of L3, L4, but no pain at that level.  - IR consulted for vertebroplasty  Hypertension - BP elevated overnight.  - cont metoprolol 75mg  bid  - inc norvasc 5mg  -> 10mg  qdaily   Diabetes mellitus - Better CBG control yesterday, averaging ~250, with one value of ~350 yesterday evening. Was as low as 40 and as high as ~500 the day prior. Further upward titration is warranted at this point as patient now eating regularly.  - continue resistant SSI coverage  - cont lantus 18U qhs   Chronic  A fib - [name] [facility]) adamant about continuing to pursue warfarin anticoagulation rather than pradaxa. She is however willing to transition to pradaxa in 1-2 months if patient doing well (doesn't want too much changed in the acute setting).  - warfarin per pharmacy (INR = 1.72 this AM)    AKI/constrast-induced nephropathy - Resolved.   Leukocytosis - resolved.  Dispo - will continue diuresis for CHF exacerbation. IR consulted for consideration of vertebroplasty of T10 vertebra, after which patient will be appropriate for discharge to inpatient rehab (likely tomorrow AM).    Length of Stay: 7   Elenor Legato, MD PGY1, Internal Medicine Resident 10/02/2012, 10:53 AM

## 2012-10-02 NOTE — Progress Notes (Signed)
Pt observed having apnea periods between 10-15 seconds. Pt SpO2 would decline into the mid 80s when on room air. Placed pt on 2L N/C and SpO2 has continued to stay in the mid to upper 90s. Pt has moderate upper airway wheezing when waking up due to pt forcing air out. Pt was unable to tell RT if pt wears a mask at home at night. RT would recommend pt having a sleep study done.

## 2012-10-02 NOTE — Progress Notes (Signed)
Physical Therapy Treatment Patient Details Name: Aaron [name] MRN: [id number] DOB: 07-10-1924 Today's Date: 10/02/2012 Time: 6295-2841 PT Time Calculation (min): 28 min  PT Assessment / Plan / Recommendation Comments on Treatment Session  Pt s/p CVA with recent LBKA.  NSTEMI over weekend.  Pt able to stand nicely with Huntley Dec Plus and stand up fairly tall.  Still with SOB and some wheezing noted as well.  DOE 2/4.      Follow Up Recommendations  Post acute inpatient     Does the patient have the potential to tolerate intense rehabilitation  Yes, Recommend IP Rehab Screening  Barriers to Discharge        Equipment Recommendations  None recommended by PT    Recommendations for Other Services Rehab consult  Frequency Min 4X/week   Plan Discharge plan remains appropriate;Frequency remains appropriate    Precautions / Restrictions Precautions Precautions: Fall Precaution Comments: LLE BKA Restrictions Weight Bearing Restrictions: No   Pertinent Vitals/Pain VSS, No pain    Mobility  Bed Mobility Bed Mobility: Rolling Right;Right Sidelying to Sit;Sitting - Scoot to [place] of Bed Rolling Right: 3: Mod assist;With rail Rolling Left: Not tested (comment) Right Sidelying to Sit: 3: Mod assist;With rails;HOB elevated Supine to Sit: Not tested (comment) Sitting - Scoot to Edge of Bed: 3: Mod assist Details for Bed Mobility Assistance: Pt. needed facilitation and verbal sequencing cues for trunk rotation, min to mod verbal and tactile cues for side to sit.  Transfers Transfers: Sit to Stand;Stand to Sit;Stand Pivot Transfers Sit to Stand: 1: +2 Total assist;With upper extremity assist;From bed;With armrests Sit to Stand: Patient Percentage: 70% Stand to Sit: 1: +2 Total assist;With upper extremity assist;To chair/3-in-1;With armrests Stand to Sit: Patient Percentage: 70% Stand Pivot Transfers: 1: +2 Total assist;With armrests Stand Pivot Transfers: Patient Percentage: 70% Squat Pivot  Transfers: Not tested (comment) Transfer via Lift Equipment: Hydrographic surveyor Details for Transfer Assistance: Pt needed cues and assist to bring his weight forward over his foot for pivot transfer on RLE.  Used Huntley Dec Plus to assist with this.  Pt. initiated movement and needed facilitation to complete movement.  Used Huntley Dec Plus which worked nicely for sit to stand.  Pt. used UEs to pull self up more than using LE.   Ambulation/Gait Ambulation/Gait Assistance: Not tested (comment) Stairs: No Wheelchair Mobility Wheelchair Mobility: No Modified Rankin (Stroke Patients Only) Pre-Morbid Rankin Score: Moderately severe disability Modified Rankin: Severe disability    PT Goals Acute Rehab PT Goals PT Goal: Supine/Side to Sit - Progress: Progressing toward goal PT Goal: Sit at Edge Of Bed - Progress: Progressing toward goal PT Goal: Sit to Stand - Progress: Progressing toward goal PT Goal: Stand to Sit - Progress: Progressing toward goal PT Transfer Goal: Bed to Chair/Chair to Bed - Progress: Progressing toward goal PT Goal: Stand - Progress: Progressing toward goal  Visit Information  Last PT Received On: 10/02/12 Assistance Needed: +2 PT/OT Co-Evaluation/Treatment: Yes    Subjective Data  Subjective: "I want a Bud light."   Cognition  Overall Cognitive Status: Appears within functional limits for tasks assessed/performed Arousal/Alertness: Awake/alert Orientation Level: Appears intact for tasks assessed Behavior During Session: [facility] for tasks performed    Balance  Static Sitting Balance Static Sitting - Balance Support: Bilateral upper extremity supported Static Sitting - Level of Assistance: 4: Min assist Static Sitting - Comment/# of Minutes: Continues to slump posteriorly with kyphosis in sitting position causing most of weight to be posterior to body.   Dynamic Sitting  Balance Dynamic Sitting - Level of Assistance: Not tested (comment) Static Standing Balance Static Standing -  Balance Support: Bilateral upper extremity supported;During functional activity Static Standing - Level of Assistance: 1: +2 Total assist Static Standing - Comment/# of Minutes: Stood in [place] Plus with footplate in place for 4 minutes with Tot A +2 (pt = 75%).  Pt. using L UE a lot to hold onto [facility] but even when asked to wash face, pt was able to still stand up on Sara Plus.  Pt. did not stand up as tall without L UE support and was leaning to right and posteriorly.  As soon as pt resumed his L hand on machine, he corrrected his posture and was standing fully upright and more at midline.    End of Session PT - End of Session Equipment Utilized During Treatment: Gait belt;Oxygen (on 2.5 L) Activity Tolerance: Patient tolerated treatment well Patient left: in chair;with call bell/phone within reach Nurse Communication: Mobility status        INGOLD,Mickle Campton 10/02/2012, 10:40 AM [facility] [phone] (pager)

## 2012-10-02 NOTE — Progress Notes (Signed)
Patient ID: Aaron [name], male   DOB: 27-Mar-1924, 78 y.o.   MRN: [id number]   Subjective:  No chest pain.  BP running mildly high.  Objective:  Vital Signs in the last 24 hours: Temp:  [97.4 F (36.3 C)-99 F (37.2 C)] 97.6 F (36.4 C) (10/15 0316) Pulse Rate:  [75-97] 84  (10/15 0730) Resp:  [19-26] 19  (10/15 0730) BP: (126-172)/(71-98) 172/98 mmHg (10/15 0730) SpO2:  [98 %-100 %] 100 % (10/15 0730) Weight:  [150 lb 11.2 oz (68.357 kg)] 150 lb 11.2 oz (68.357 kg) (10/15 0500)  Intake/Output from previous day: 10/14 0701 - 10/15 0700 In: 780 [P.O.:780] Out: 1826 [Urine:1825; Stool:1] Intake/Output from this shift:       . amLODipine  5 mg Oral Daily  . aspirin EC  81 mg Oral Daily  . atorvastatin  80 mg Oral q1800  . furosemide  20 mg Intravenous Once  . furosemide  40 mg Oral BID  . influenza  inactive virus vaccine  0.5 mL Intramuscular Once  . insulin aspart  0-20 Units Subcutaneous TID WC  . insulin glargine  18 Units Subcutaneous QHS  . lidocaine  1 patch Transdermal Q24H  . metoprolol tartrate  75 mg Oral BID  . pantoprazole  40 mg Oral QHS  . polyethylene glycol  17 g Oral Daily  . sodium chloride  500 mL Intravenous Once  . warfarin  2.5 mg Oral ONCE-1800  . warfarin  5 mg Oral ONCE-1800  . Warfarin - Pharmacist Dosing Inpatient   Does not apply q1800  . DISCONTD: alendronate  70 mg Oral Weekly  . DISCONTD: furosemide  40 mg Intravenous BID  . DISCONTD: insulin glargine  15 Units Subcutaneous QHS      Physical Exam: The patient appears to be in no distress.  Head and neck exam reveals that the pupils are equal and reactive.  The extraocular movements are full.  There is no scleral icterus.  Mouth and pharynx are benign.  No lymphadenopathy.  No carotid bruits.  The jugular venous pressure is normal.  Thyroid is not enlarged or tender.  Chest reveals mild expiratory wheezing.  Heart reveals no abnormal lift or heave.  First and second heart sounds are  normal. There is grade 1/6 systolic murmur at LSE. There is no  gallop rub or click. Rhythm is irregular.  The abdomen is soft and nontender.  Bowel sounds are normoactive.  There is no hepatosplenomegaly or mass.  There are no abdominal bruits.  Extremities reveal mild edema on right.  Left BKA.  Neurologic exam reveals right-sided weakness.  Integument reveals no rash  Lab Results:  Basename 10/02/12 0124 10/01/12 0754  WBC 10.5 13.5*  HGB 11.0* 11.8*  PLT 319 338    Basename 10/02/12 0124 10/01/12 0754  NA 136 135  K 4.2 4.7  CL 101 102  CO2 28 26  GLUCOSE 120* 205*  BUN 25* 23  CREATININE 1.09 1.14    Basename 10/02/12 0124 10/01/12 2136  TROPONINI 4.55* 4.34*   Hepatic Function Panel No results found for this basename: PROT,ALBUMIN,AST,ALT,ALKPHOS,BILITOT,BILIDIR,IBILI in the last 72 hours No results found for this basename: CHOL in the last 72 hours No results found for this basename: PROTIME in the last 72 hours  Imaging: Dg Thoracic Spine 2 View  09/30/2012  *RADIOLOGY REPORT*  Clinical Data: Recent fall.  Back pain.  THORACIC SPINE - 2 VIEW  Comparison: 09/27/2012.  Findings: Vertebral augmentation is present from T11-L1.  There  is a T10 compression fracture which was present on the prior radiograph 09/25/2012.  No other compression fractures of the thoracic spine are identified.  Partially visualized cervical spondylosis. Mid thoracic vertebral body height appears preserved.  IMPRESSION: T10 compression fracture is unchanged compared to 09/25/2012.  T11- L1 vertebral augmentation.   Original Report Authenticated By: Andreas Newport, M.D.     Cardiac Studies: Telemetry shows atrial fib with rate 90-100 Assessment/Plan:   A-fib (09/25/2012)   Assessment: Continue warfarin and rate control     Aortic valve replaced (09/25/2012)   Assessment: Normal prosthetic function     NSTEMI (non-ST elevated myocardial infarction) (09/30/2012)   Assessment: Continue  noninvasive approach.   Plan: consider lexiscan myoview, likely as an outpatient.   LOS: 7 days    Gregg Taylor,M.D. 10/02/2012, 9:36 AM

## 2012-10-03 ENCOUNTER — Inpatient Hospital Stay (HOSPITAL_COMMUNITY)
Admission: RE | Admit: 2012-10-03 | Discharge: 2012-10-24 | DRG: 945 | Disposition: A | Discharge status: Home Health | Payer: Medicare Other | Source: Ambulatory Visit | Attending: Physical Medicine & Rehabilitation | Admitting: Physical Medicine & Rehabilitation

## 2012-10-03 ENCOUNTER — Inpatient Hospital Stay (HOSPITAL_COMMUNITY): Payer: Medicare Other

## 2012-10-03 DIAGNOSIS — L97509 Non-pressure chronic ulcer of other part of unspecified foot with unspecified severity: Secondary | ICD-10-CM | POA: Diagnosis present

## 2012-10-03 DIAGNOSIS — Z952 Presence of prosthetic heart valve: Secondary | ICD-10-CM

## 2012-10-03 DIAGNOSIS — I6330 Cerebral infarction due to thrombosis of unspecified cerebral artery: Secondary | ICD-10-CM

## 2012-10-03 DIAGNOSIS — I6340 Cerebral infarction due to embolism of unspecified cerebral artery: Secondary | ICD-10-CM | POA: Diagnosis present

## 2012-10-03 DIAGNOSIS — Z7982 Long term (current) use of aspirin: Secondary | ICD-10-CM

## 2012-10-03 DIAGNOSIS — I2510 Atherosclerotic heart disease of native coronary artery without angina pectoris: Secondary | ICD-10-CM | POA: Diagnosis present

## 2012-10-03 DIAGNOSIS — E1149 Type 2 diabetes mellitus with other diabetic neurological complication: Secondary | ICD-10-CM | POA: Diagnosis present

## 2012-10-03 DIAGNOSIS — G547 Phantom limb syndrome without pain: Secondary | ICD-10-CM | POA: Diagnosis present

## 2012-10-03 DIAGNOSIS — I4891 Unspecified atrial fibrillation: Secondary | ICD-10-CM | POA: Diagnosis present

## 2012-10-03 DIAGNOSIS — Z7901 Long term (current) use of anticoagulants: Secondary | ICD-10-CM

## 2012-10-03 DIAGNOSIS — E1142 Type 2 diabetes mellitus with diabetic polyneuropathy: Secondary | ICD-10-CM | POA: Diagnosis present

## 2012-10-03 DIAGNOSIS — Z794 Long term (current) use of insulin: Secondary | ICD-10-CM

## 2012-10-03 DIAGNOSIS — I509 Heart failure, unspecified: Secondary | ICD-10-CM | POA: Diagnosis present

## 2012-10-03 DIAGNOSIS — Z5189 Encounter for other specified aftercare: Principal | ICD-10-CM

## 2012-10-03 DIAGNOSIS — R1310 Dysphagia, unspecified: Secondary | ICD-10-CM | POA: Diagnosis present

## 2012-10-03 DIAGNOSIS — Z951 Presence of aortocoronary bypass graft: Secondary | ICD-10-CM

## 2012-10-03 DIAGNOSIS — I10 Essential (primary) hypertension: Secondary | ICD-10-CM

## 2012-10-03 DIAGNOSIS — S88119A Complete traumatic amputation at level between knee and ankle, unspecified lower leg, initial encounter: Secondary | ICD-10-CM

## 2012-10-03 DIAGNOSIS — J441 Chronic obstructive pulmonary disease with (acute) exacerbation: Secondary | ICD-10-CM

## 2012-10-03 DIAGNOSIS — I639 Cerebral infarction, unspecified: Secondary | ICD-10-CM

## 2012-10-03 LAB — CULTURE, BLOOD (ROUTINE X 2): Culture: NO GROWTH

## 2012-10-03 LAB — GLUCOSE, CAPILLARY: Glucose-Capillary: 187 mg/dL — ABNORMAL HIGH (ref 70–99)

## 2012-10-03 LAB — BASIC METABOLIC PANEL
BUN: 29 mg/dL — ABNORMAL HIGH (ref 6–23)
Calcium: 8.5 mg/dL (ref 8.4–10.5)
Creatinine, Ser: 1.18 mg/dL (ref 0.50–1.35)
GFR calc non Af Amer: 54 mL/min — ABNORMAL LOW (ref >90)
Glucose, Bld: 367 mg/dL — ABNORMAL HIGH (ref 70–99)

## 2012-10-03 LAB — CBC
MCH: 28.4 pg (ref 26.0–34.0)
MCHC: 32 g/dL (ref 30.0–36.0)
Platelets: 355 10*3/uL (ref 150–400)

## 2012-10-03 MED ORDER — ASPIRIN 81 MG PO TBEC: 81.0000 mg | DELAYED_RELEASE_TABLET | Freq: Every day | ORAL | Status: DC

## 2012-10-03 MED ORDER — POLYETHYLENE GLYCOL 3350 17 G PO PACK: 17.0000 g | PACK | Freq: Every day | ORAL | Status: DC

## 2012-10-03 MED ORDER — INSULIN ASPART 100 UNIT/ML ~~LOC~~ SOLN
0.0000 [IU] | Freq: Three times a day (TID) | SUBCUTANEOUS | Status: DC
  Administered 2012-10-04: 3 [IU] via SUBCUTANEOUS
  Administered 2012-10-04: 7 [IU] via SUBCUTANEOUS
  Administered 2012-10-05: 15 [IU] via SUBCUTANEOUS
  Administered 2012-10-05: 4 [IU] via SUBCUTANEOUS
  Administered 2012-10-06: 20 [IU] via SUBCUTANEOUS
  Administered 2012-10-06: 3 [IU] via SUBCUTANEOUS
  Administered 2012-10-06: 4 [IU] via SUBCUTANEOUS
  Administered 2012-10-07: 3 [IU] via SUBCUTANEOUS
  Administered 2012-10-07: 4 [IU] via SUBCUTANEOUS

## 2012-10-03 MED ORDER — WARFARIN - PHARMACIST DOSING INPATIENT
Freq: Every day | Status: DC
  Administered 2012-10-18: 18:00:00

## 2012-10-03 MED ORDER — ONDANSETRON HCL 4 MG/2ML IJ SOLN: 4.0000 mg | Freq: Four times a day (QID) | INTRAMUSCULAR | Status: DC | PRN

## 2012-10-03 MED ORDER — GUAIFENESIN 100 MG/5ML PO SOLN: 5.0000 mL | ORAL | Status: DC | PRN

## 2012-10-03 MED ORDER — BISACODYL 10 MG RE SUPP: 10.0000 mg | Freq: Every day | RECTAL | Status: DC | PRN

## 2012-10-03 MED ORDER — ATORVASTATIN CALCIUM 80 MG PO TABS: 80.0000 mg | ORAL_TABLET | Freq: Every day | ORAL | Status: DC

## 2012-10-03 MED ORDER — ALBUTEROL SULFATE (5 MG/ML) 0.5% IN NEBU: 2.5000 mg | INHALATION_SOLUTION | Freq: Three times a day (TID) | RESPIRATORY_TRACT | Status: DC | PRN

## 2012-10-03 MED ORDER — ZOLPIDEM TARTRATE 5 MG PO TABS: 5.0000 mg | ORAL_TABLET | Freq: Every evening | ORAL | Status: DC | PRN

## 2012-10-03 MED ORDER — ONDANSETRON HCL 4 MG PO TABS: 4.0000 mg | ORAL_TABLET | Freq: Four times a day (QID) | ORAL | Status: DC | PRN

## 2012-10-03 MED ORDER — SORBITOL 70 % SOLN: 30.0000 mL | Freq: Every day | Status: DC | PRN

## 2012-10-03 MED ORDER — POLYETHYLENE GLYCOL 3350 17 G PO PACK
17.0000 g | PACK | Freq: Every day | ORAL | Status: DC
  Administered 2012-10-04 – 2012-10-24 (×19): 17 g via ORAL
  Filled 2012-10-03 (×22): qty 1

## 2012-10-03 MED ORDER — INSULIN ASPART 100 UNIT/ML ~~LOC~~ SOLN: 0.0000 [IU] | Freq: Three times a day (TID) | SUBCUTANEOUS | Status: DC

## 2012-10-03 MED ORDER — GUAIFENESIN 100 MG/5ML PO SOLN
5.0000 mL | ORAL | Status: DC | PRN
  Filled 2012-10-03: qty 5

## 2012-10-03 MED ORDER — ACETAMINOPHEN 325 MG PO TABS
650.0000 mg | ORAL_TABLET | Freq: Four times a day (QID) | ORAL | Status: DC | PRN
  Administered 2012-10-03 – 2012-10-23 (×21): 650 mg via ORAL
  Filled 2012-10-03 (×7): qty 2
  Filled 2012-10-03: qty 1
  Filled 2012-10-03 (×16): qty 2

## 2012-10-03 MED ORDER — BISACODYL 10 MG RE SUPP
10.0000 mg | Freq: Every day | RECTAL | Status: DC | PRN
Start: 2012-10-03 — End: 2012-10-24

## 2012-10-03 MED ORDER — WARFARIN - PHARMACIST DOSING INPATIENT: Freq: Every day | Status: DC

## 2012-10-03 MED ORDER — ACETAMINOPHEN 325 MG PO TABS: 650.0000 mg | ORAL_TABLET | Freq: Four times a day (QID) | ORAL | Status: DC | PRN

## 2012-10-03 MED ORDER — LIDOCAINE 5 % EX PTCH
2.0000 | MEDICATED_PATCH | CUTANEOUS | Status: DC
  Administered 2012-10-04 – 2012-10-21 (×18): 2 via TRANSDERMAL
  Filled 2012-10-03 (×22): qty 2

## 2012-10-03 MED ORDER — PANTOPRAZOLE SODIUM 40 MG PO TBEC: 40.0000 mg | DELAYED_RELEASE_TABLET | Freq: Every day | ORAL | Status: DC

## 2012-10-03 MED ORDER — ALBUTEROL SULFATE (5 MG/ML) 0.5% IN NEBU
2.5000 mg | INHALATION_SOLUTION | Freq: Three times a day (TID) | RESPIRATORY_TRACT | Status: DC | PRN
Start: 2012-10-03 — End: 2012-10-24
  Administered 2012-10-03 – 2012-10-06 (×3): 2.5 mg via RESPIRATORY_TRACT
  Filled 2012-10-03 (×3): qty 0.5

## 2012-10-03 MED ORDER — INSULIN GLARGINE 100 UNIT/ML ~~LOC~~ SOLN: 18.0000 [IU] | Freq: Every day | SUBCUTANEOUS | Status: DC

## 2012-10-03 MED ORDER — INSULIN GLARGINE 100 UNIT/ML ~~LOC~~ SOLN
18.0000 [IU] | Freq: Every day | SUBCUTANEOUS | Status: DC
  Administered 2012-10-03: 18 [IU] via SUBCUTANEOUS

## 2012-10-03 MED ORDER — TORSEMIDE 10 MG PO TABS
10.0000 mg | ORAL_TABLET | Freq: Two times a day (BID) | ORAL | Status: DC
  Administered 2012-10-03 – 2012-10-24 (×42): 10 mg via ORAL
  Filled 2012-10-03 (×46): qty 1

## 2012-10-03 MED ORDER — AMLODIPINE BESYLATE 10 MG PO TABS
10.0000 mg | ORAL_TABLET | Freq: Every day | ORAL | Status: DC
  Administered 2012-10-04 – 2012-10-24 (×21): 10 mg via ORAL
  Filled 2012-10-03 (×22): qty 1

## 2012-10-03 MED ORDER — METOPROLOL TARTRATE 50 MG PO TABS
75.0000 mg | ORAL_TABLET | Freq: Two times a day (BID) | ORAL | Status: DC
  Administered 2012-10-03 – 2012-10-24 (×42): 75 mg via ORAL
  Filled 2012-10-03 (×46): qty 1

## 2012-10-03 MED ORDER — PANTOPRAZOLE SODIUM 40 MG PO TBEC
40.0000 mg | DELAYED_RELEASE_TABLET | Freq: Every day | ORAL | Status: DC
  Administered 2012-10-03 – 2012-10-23 (×21): 40 mg via ORAL
  Filled 2012-10-03 (×21): qty 1

## 2012-10-03 MED ORDER — WARFARIN SODIUM 2.5 MG PO TABS
2.5000 mg | ORAL_TABLET | Freq: Once | ORAL | Status: AC
  Administered 2012-10-03: 2.5 mg via ORAL
  Filled 2012-10-03 (×2): qty 1

## 2012-10-03 MED ORDER — AMLODIPINE BESYLATE 10 MG PO TABS: 10.0000 mg | ORAL_TABLET | Freq: Every day | ORAL | Status: DC

## 2012-10-03 MED ORDER — ATORVASTATIN CALCIUM 80 MG PO TABS
80.0000 mg | ORAL_TABLET | Freq: Every day | ORAL | Status: DC
  Administered 2012-10-04 – 2012-10-23 (×20): 80 mg via ORAL
  Filled 2012-10-03 (×21): qty 1

## 2012-10-03 MED ORDER — SENNOSIDES-DOCUSATE SODIUM 8.6-50 MG PO TABS: 1.0000 | ORAL_TABLET | Freq: Every evening | ORAL | Status: DC | PRN

## 2012-10-03 MED ORDER — TORSEMIDE 10 MG PO TABS
10.0000 mg | ORAL_TABLET | Freq: Two times a day (BID) | ORAL | Status: DC
  Administered 2012-10-03: 10 mg via ORAL
  Filled 2012-10-03 (×3): qty 1

## 2012-10-03 MED ORDER — ASPIRIN EC 81 MG PO TBEC
81.0000 mg | DELAYED_RELEASE_TABLET | Freq: Every day | ORAL | Status: DC
  Administered 2012-10-04 – 2012-10-24 (×21): 81 mg via ORAL
  Filled 2012-10-03 (×22): qty 1

## 2012-10-03 MED ORDER — WARFARIN SODIUM 2.5 MG PO TABS
2.5000 mg | ORAL_TABLET | Freq: Every day | ORAL | Status: DC
  Filled 2012-10-03: qty 1

## 2012-10-03 NOTE — H&P (View-Only) (Signed)
Physical Medicine and Rehabilitation Admission H&P    Chief Complaint  Patient presents with  . Code Stroke  : HPI: Aaron [name] is a 78 y.o. RH-male with history of DM with peripheral neuropathy, L-AKA 08/01/2012 at [facility] and was discharged to [facility] skilled nursing facility before returning home with 24-hour care, A Fib and maintained on chronic Coumadin Admitted on 09/25/12 with inability to move right side and inability to speak. Patient on chronic coumadin that had been held recently after noted INR 3.1 and INR sub-therapeutic at admission of 1.35. CT head revealing remote right occipital and cerebellar infarcts with global atrophy. CTA head/neck with high grade stenosis of R-VA, distal VA essentially occluded, 50% stenosis prox L-ICA, moderate supraclinoid ICA stenosis bilaterally. MRI brain done revealing acute infarct posterior left frontal lobe. Patient had improvement of symptoms in ED. Was able to start speaking and move RUE. 2 D echo done revealing mild LVH, EF 65-70%, bioprosthetic AV present. Neurology was consulted there was a first recommendations to begin Pradaxa but the family still preferred Coumadin thus Coumadin was resumed as well as the addition of low-dose aspirin. Patient did not receive TPA. Patient has had gradual worsening of symptoms overnight. ST evaluation showed mild oral phase dysphagia and patient placed on D3 diet. Noted patient with complaints of low back pain her x-rays and imaging revealed mild compression fractures of T7 and T10 of indeterminate age as well as compression chronic fractures T11, T12 and L1 . There was initially planned for possible vertebroplasty which had been held due to INR being more than 1.5. It was later advised to hold on vertebroplasty at this time and to a rehabilitation completed for CVA and discuss outpatient vertebroplasty. PT evaluation done revealing balance deficits and problems with mobility. Neurology  recommends changing patient to pradaxa for cardio embolic stroke. for MD, therapy team recommending CIR. patient was felt to be a good candidate for inpatient rehabilitation services and was admitted for comprehensive rehabilitation program.  Review of Systems  HENT: Negative for hearing loss.  Dry and sore mouth.  Eyes: Negative for blurred vision and double vision.  Respiratory: Negative for cough and shortness of breath.  Cardiovascular: Negative for chest pain and palpitations.  Gastrointestinal: Positive for nausea, vomiting and constipation. Negative for abdominal pain.  Genitourinary: Negative for urgency and frequency.  Musculoskeletal: Positive for back pain (muscle spasms.) and falls (fall a week ago striking mid back.).  Neurological: Positive for speech change and focal weakness. Negative for dizziness and headaches.  Psychiatric/Behavioral   Past Medical History  Diagnosis Date  . Hypertension   . Diabetes mellitus   . CHF (congestive heart failure)   . Coronary artery disease   . Atrial fibrillation   . PVD (peripheral vascular disease)   . S/P CABG x 1 2008  . S/P aortic valve replacement with porcine valve 2008   Past Surgical History  Procedure Date  . Below knee leg amputation 07/2012  . Cataract extraction w/ intraocular lens implant   . Abdominal surgery   . Hernia repair     x5  . Back surgery     vertebroplasty  . Coronary artery bypass graft 2008   History reviewed. No pertinent family history. Social History:  reports that he has quit smoking. He uses smokeless tobacco. He reports that he does not drink alcohol or use illicit drugs. Allergies:  Allergies  Allergen Reactions  . Metformin And Related Hives  . Penicillins Hives and Swelling     Medications Prior to Admission  Medication Sig Dispense Refill  . Calcium Carbonate-Vitamin D (CALCIUM 600+D) 600-400 MG-UNIT per tablet Take 1 tablet by mouth daily.      . Cholecalciferol (VITAMIN D-3) 1000  UNITS CAPS Take 1,000 Units by mouth at bedtime.      . DULoxetine (CYMBALTA) 30 MG capsule Take 30 mg by mouth at bedtime.       . metoprolol (LOPRESSOR) 50 MG tablet Take 75 mg by mouth 2 (two) times daily.      . torsemide (DEMADEX) 10 MG tablet Take 10 mg by mouth 2 (two) times daily.       . DISCONTD: amLODipine (NORVASC) 5 MG tablet Take 5 mg by mouth 2 (two) times daily.      . DISCONTD: HYDROCODONE-ACETAMINOPHEN PO Take 1-2 tablets by mouth every 6 (six) hours as needed. For pain      . DISCONTD: insulin aspart (NOVOLOG) 100 UNIT/ML injection Inject 3-6 Units into the skin 3 (three) times daily before meals. 6 units every morning, 3 units with lunch and 5 units with dinner      . DISCONTD: losartan (COZAAR) 100 MG tablet Take 50 mg by mouth 2 (two) times daily.      . DISCONTD: losartan (COZAAR) 50 MG tablet Take 50 mg by mouth 2 (two) times daily.      . DISCONTD: nystatin (MYCOSTATIN/NYSTOP) 100000 UNIT/GM POWD Apply topically 2 (two) times daily as needed. Apply to buttocks for rash as needed      . DISCONTD: simvastatin (ZOCOR) 40 MG tablet Take 40 mg by mouth every evening.      . DISCONTD: warfarin (COUMADIN) 2.5 MG tablet Take 2.5 mg by mouth daily.        Home: Home Living Lives With: Alone Available Help at Discharge: Personal care attendant;Available 24 hours/day Type of Home: House Home Access: Ramped entrance Home Layout: One level Bathroom Shower/Tub: Tub/shower unit Bathroom Toilet: Standard Home Adaptive Equipment: Walker - rolling;Bedside commode/3-in-1;Tub transfer bench;Wheelchair - manual Additional Comments: Pt had L BKA  in August 2013 per daugther report and [facility] about 10 days [facility].   Functional History: Prior Function Bath: Minimal Dressing: Minimal Able to Take Stairs?: No Driving: No Vocation: Retired  Functional Status:  Mobility: Bed Mobility Bed Mobility: Rolling Right;Right Sidelying to Sit;Sitting - Scoot to Edge of  Bed Rolling Right: 4: Min assist;With rail Rolling Left: Not tested (comment) Rolling Left: Patient Percentage: 70% Right Sidelying to Sit: 4: Min assist;With rails;HOB elevated Supine to Sit: Not tested (comment) Sitting - Scoot to Edge of Bed: 4: Min assist Sit to Supine: Not Tested (comment) Transfers Transfers: Sit to Stand;Stand to Sit;Squat Pivot Transfers Sit to Stand: 1: +2 Total assist;With upper extremity assist;From bed Sit to Stand: Patient Percentage: 50% Stand to Sit: 1: +2 Total assist;With upper extremity assist;With armrests;To chair/3-in-1 Stand to Sit: Patient Percentage: 40% Stand Pivot Transfers: 1: +2 Total assist Stand Pivot Transfers: Patient Percentage: 50% Squat Pivot Transfers: Not tested (comment) Squat Pivot Transfers: Patient Percentage: 50% Transfer via Lift Equipment: Sara Lift Ambulation/Gait Ambulation/Gait Assistance: Not tested (comment) Stairs: No Wheelchair Mobility Wheelchair Mobility: No  ADL: ADL Grooming: Performed;Brushing hair;Wash/dry face;Minimal assistance Where Assessed - Grooming: Unsupported sitting Upper Body Bathing: Simulated;Moderate assistance Where Assessed - Upper Body Bathing: Supported sitting Lower Body Bathing: Simulated;Moderate assistance (+2 for standing) Where Assessed - Lower Body Bathing: Supported sit to stand Upper Body Dressing: Simulated;Moderate assistance Where Assessed - Upper Body Dressing:   Supported sitting Lower Body Dressing: Performed;Moderate assistance Where Assessed - Lower Body Dressing: Unsupported sitting Toilet Transfer: +2 Total assistance Toilet Transfer Method:  (using sera plus) Toilet Transfer Equipment: Other (comment) (to recliner.) Equipment Used: Gait belt Transfers/Ambulation Related to ADLs: +2 total A (pt=60%) squat pivot to right side ADL Comments: Pt was able to A in donning shoe and adjusting sock. Pt does tend to lean posteriorly during unsupported sitting. Fatigues very  quickly.  Cognition: Cognition Overall Cognitive Status: Appears within functional limits for tasks assessed Arousal/Alertness: Awake/alert Orientation Level: Oriented to person;Oriented to place;Oriented to time Cognition Overall Cognitive Status: Appears within functional limits for tasks assessed/performed Arousal/Alertness: Awake/alert Orientation Level: Appears intact for tasks assessed Behavior During Session: WFL for tasks performed Cognition - Other Comments: frustrated at times especially since he cannot use his RUE as he wants to   Blood pressure 148/64, pulse 91, temperature 97.1 F (36.2 C), temperature source Oral, resp. rate 20, height 5' 8" (1.727 m), weight 67.5 kg (148 lb 13 oz), SpO2 97.00%. Physical Exam  Constitutional: He is oriented to person, place, and time. He appears well-developed and well-nourished.  HENT:  Head: Normocephalic and atraumatic.  Eyes: Pupils are equal, round, and reactive to light.  Neck: Normal range of motion. Neck supple.  Cardiovascular: An irregularly irregular rhythm present. Tachycardia present.  Pulmonary/Chest: Effort normal and breath sounds normal.  Abdominal: Soft. Bowel sounds are normal. He exhibits no distension. There is no tenderness.  Musculoskeletal: He exhibits edema (right hand).  Neurological: He is alert and oriented to person, place, and time.  Alert. Easily frustrated.    Very dysarthric speech. Able to follow basic commands without difficulty. Needed some cueing to recall DOB and current month. RUE 2-3 prox to 0/5 at HI.  RLE 3+ to 4+/5. No gross sensory abnl. Right 7 and tongue deviation. No gross visual abnormalities. Skin:  Left BK is well healed and leg itself is extremely well shaped.  Results for orders placed during the hospital encounter of 09/25/12 (from the past 48 hour(s))  TROPONIN I     Status: Abnormal   Collection Time   10/01/12  1:52 PM      Component Value Range Comment   Troponin I 4.75 (*)  <0.30 ng/mL   CK TOTAL AND CKMB     Status: Normal   Collection Time   10/01/12  1:52 PM      Component Value Range Comment   Total CK 54  7 - 232 U/L    CK, MB 4.0  0.3 - 4.0 ng/mL    Relative Index RELATIVE INDEX IS INVALID  0.0 - 2.5   GLUCOSE, CAPILLARY     Status: Abnormal   Collection Time   10/01/12  4:37 PM      Component Value Range Comment   Glucose-Capillary 254 (*) 70 - 99 mg/dL   TROPONIN I     Status: Abnormal   Collection Time   10/01/12  9:36 PM      Component Value Range Comment   Troponin I 4.34 (*) <0.30 ng/mL   CK TOTAL AND CKMB     Status: Normal   Collection Time   10/01/12  9:36 PM      Component Value Range Comment   Total CK 48  7 - 232 U/L    CK, MB 3.1  0.3 - 4.0 ng/mL    Relative Index RELATIVE INDEX IS INVALID  0.0 - 2.5   GLUCOSE, CAPILLARY       Status: Abnormal   Collection Time   10/01/12  9:56 PM      Component Value Range Comment   Glucose-Capillary 111 (*) 70 - 99 mg/dL   TROPONIN I     Status: Abnormal   Collection Time   10/02/12  1:24 AM      Component Value Range Comment   Troponin I 4.55 (*) <0.30 ng/mL   CK TOTAL AND CKMB     Status: Normal   Collection Time   10/02/12  1:24 AM      Component Value Range Comment   Total CK 44  7 - 232 U/L    CK, MB 3.0  0.3 - 4.0 ng/mL    Relative Index RELATIVE INDEX IS INVALID  0.0 - 2.5   PROTIME-INR     Status: Abnormal   Collection Time   10/02/12  1:24 AM      Component Value Range Comment   Prothrombin Time 19.6 (*) 11.6 - 15.2 seconds    INR 1.72 (*) 0.00 - 1.49   BASIC METABOLIC PANEL     Status: Abnormal   Collection Time   10/02/12  1:24 AM      Component Value Range Comment   Sodium 136  135 - 145 mEq/L    Potassium 4.2  3.5 - 5.1 mEq/L    Chloride 101  96 - 112 mEq/L    CO2 28  19 - 32 mEq/L    Glucose, Bld 120 (*) 70 - 99 mg/dL    BUN 25 (*) 6 - 23 mg/dL    Creatinine, Ser 1.09  0.50 - 1.35 mg/dL    Calcium 8.5  8.4 - 10.5 mg/dL    GFR calc non Af Amer 59 (*) >90 mL/min     GFR calc Af Amer 69 (*) >90 mL/min   CBC     Status: Abnormal   Collection Time   10/02/12  1:24 AM      Component Value Range Comment   WBC 10.5  4.0 - 10.5 K/uL    RBC 3.90 (*) 4.22 - 5.81 MIL/uL    Hemoglobin 11.0 (*) 13.0 - 17.0 g/dL    HCT 34.4 (*) 39.0 - 52.0 %    MCV 88.2  78.0 - 100.0 fL    MCH 28.2  26.0 - 34.0 pg    MCHC 32.0  30.0 - 36.0 g/dL    RDW 15.7 (*) 11.5 - 15.5 %    Platelets 319  150 - 400 K/uL   CK TOTAL AND CKMB     Status: Normal   Collection Time   10/02/12  8:12 AM      Component Value Range Comment   Total CK 48  7 - 232 U/L    CK, MB 3.5  0.3 - 4.0 ng/mL    Relative Index RELATIVE INDEX IS INVALID  0.0 - 2.5   GLUCOSE, CAPILLARY     Status: Abnormal   Collection Time   10/02/12  8:21 AM      Component Value Range Comment   Glucose-Capillary 217 (*) 70 - 99 mg/dL   GLUCOSE, CAPILLARY     Status: Abnormal   Collection Time   10/02/12 12:10 PM      Component Value Range Comment   Glucose-Capillary 213 (*) 70 - 99 mg/dL   GLUCOSE, CAPILLARY     Status: Abnormal   Collection Time   10/02/12  4:18 PM      Component Value Range Comment     Glucose-Capillary 204 (*) 70 - 99 mg/dL    Comment 1 Notify RN     CK TOTAL AND CKMB     Status: Normal   Collection Time   10/02/12  5:32 PM      Component Value Range Comment   Total CK 45  7 - 232 U/L    CK, MB 3.2  0.3 - 4.0 ng/mL    Relative Index RELATIVE INDEX IS INVALID  0.0 - 2.5   GLUCOSE, CAPILLARY     Status: Abnormal   Collection Time   10/02/12  8:23 PM      Component Value Range Comment   Glucose-Capillary 220 (*) 70 - 99 mg/dL    Comment 1 Notify RN      Comment 2 Documented in Chart     PROTIME-INR     Status: Abnormal   Collection Time   10/03/12  6:10 AM      Component Value Range Comment   Prothrombin Time 20.3 (*) 11.6 - 15.2 seconds    INR 1.81 (*) 0.00 - 1.49   BASIC METABOLIC PANEL     Status: Abnormal   Collection Time   10/03/12  6:10 AM      Component Value Range Comment    Sodium 133 (*) 135 - 145 mEq/L    Potassium 4.5  3.5 - 5.1 mEq/L    Chloride 96  96 - 112 mEq/L    CO2 28  19 - 32 mEq/L    Glucose, Bld 367 (*) 70 - 99 mg/dL    BUN 29 (*) 6 - 23 mg/dL    Creatinine, Ser 1.18  0.50 - 1.35 mg/dL    Calcium 8.5  8.4 - 10.5 mg/dL    GFR calc non Af Amer 54 (*) >90 mL/min    GFR calc Af Amer 63 (*) >90 mL/min   CBC     Status: Abnormal   Collection Time   10/03/12  6:10 AM      Component Value Range Comment   WBC 11.5 (*) 4.0 - 10.5 K/uL    RBC 3.98 (*) 4.22 - 5.81 MIL/uL    Hemoglobin 11.3 (*) 13.0 - 17.0 g/dL    HCT 35.3 (*) 39.0 - 52.0 %    MCV 88.7  78.0 - 100.0 fL    MCH 28.4  26.0 - 34.0 pg    MCHC 32.0  30.0 - 36.0 g/dL    RDW 15.9 (*) 11.5 - 15.5 %    Platelets 355  150 - 400 K/uL   GLUCOSE, CAPILLARY     Status: Abnormal   Collection Time   10/03/12  6:31 AM      Component Value Range Comment   Glucose-Capillary 372 (*) 70 - 99 mg/dL    Dg Chest 2 View  10/03/2012  *RADIOLOGY REPORT*  Clinical Data: Shortness of breath, CHF exacerbation  CHEST - 2 VIEW  Comparison: 09/29/2012  Findings: Enlargement of cardiac silhouette post CABG and AVR. Pulmonary vascular congestion. Mild pulmonary edema, improving. Bibasilar atelectasis and decrease in lung volumes noted. Calcified tortuous aorta. Small bibasilar effusions. No pneumothorax. Bones appear demineralized with compression deformities and vertebroplasties at 3 upper lumbar levels. Epicardial pacing wires noted.  IMPRESSION: Improving CHF. Mild bibasilar atelectasis and small pleural effusions.   Original Report Authenticated By: MARK A. BOLES, M.D.    Ct Thoracic Spine Wo Contrast  10/02/2012  *RADIOLOGY REPORT*  Clinical Data: T10 compression fracture.  Rule out retropulsion.  CT THORACIC   SPINE WITHOUT CONTRAST  Technique:  Multidetector CT imaging of the thoracic spine was performed without intravenous contrast administration. Multiplanar CT image reconstructions were also generated   Comparison: Lumbar radiographs 09/25/2012.  Findings: Mild compression fracture of T10.  No prior studies are available prior to  09/30/2012.  This is of indeterminate age but most likely a mild chronic fracture.  No retropulsion into the canal.  Mild compression fracture of T7.  This could be a recent fracture. No retropulsion into the spinal canal.  Cement vertebral augmentation of T11 from the right pedicle approach.  Cement is in good position.  Cement vertebral augmentation of T12 from the left pedicle approach.  There is mild epidural cement on the left.  This is not causing significant spinal stenosis.  Cement vertebral augmentation of L1-4 right pedicle approach with satisfactory cement positioning.  No evidence of metastatic disease to the spine.  There are mild to moderately large pleural effusions bilaterally.  Extensive coronary artery calcification and atherosclerotic calcification is noted.  IMPRESSION: Mild compression fractures of T7 and T10 of  indeterminate age. MRI may be helpful to evaluate for bone marrow edema.  Chronic compression fracture with cement vertebral augmentation T11, T12 and L1.   Original Report Authenticated By: DAVID C. CLARK, M.D.     Post Admission Physician Evaluation: 1. Functional deficits secondary  to embolic left frontal lobe infarct, recent left BKA, thoracic and lumbar compression fx's. 2. Patient is admitted to receive collaborative, interdisciplinary care between the physiatrist, rehab nursing staff, and therapy team. 3. Patient's level of medical complexity and substantial therapy needs in context of that medical necessity cannot be provided at a lesser intensity of care such as a SNF. 4. Patient has experienced substantial functional loss from his/her baseline which was documented above under the "Functional History" and "Functional Status" headings.  Judging by the patient's diagnosis, physical exam, and functional history, the patient has potential for  functional progress which will result in measurable gains while on inpatient rehab.  These gains will be of substantial and practical use upon discharge  in facilitating mobility and self-care at the household level. 5. Physiatrist will provide 24 hour management of medical needs as well as oversight of the therapy plan/treatment and provide guidance as appropriate regarding the interaction of the two. 6. 24 hour rehab nursing will assist with bladder management, bowel management, safety, skin/wound care, disease management, medication administration, pain management and patient education  and help integrate therapy concepts, techniques,education, etc. 7. PT will assess and treat for:  fxnl mobility, NMR, pain mgt, ROM, safety, adaptive equipment.  Goals are:  Supervision to minimal assistance with basic transfers and wc mobility. 8. OT will assess and treat for: UES, ROM, NMR, ADL's, safety, adaptive techniques and equipment.   Goals are: supervision to moderate assist. 9. SLP will assess and treat for: communicationa and speech intelligibility.  Goals are: supervision. 10. Case Management and Social Worker will assess and treat for psychological issues and discharge planning. 11. Team conference will be held weekly to assess progress toward goals and to determine barriers to discharge. 12. Patient will receive at least 3 hours of therapy per day at least 5 days per week. 13. ELOS: 2-3 weeks      Prognosis:  excellent   Medical Problem List and Plan: 1. Left frontal lobe embolic infarct/recent left below-knee amputation 07/22/2012 2. DVT Prophylaxis/Anticoagulation: Chronic Coumadin therapy. Monitor for any bleeding episodes 3. Pain Management: Tylenol as needed. Monitor with increased mobility 4.   Neuropsych: This patient is capable of making decisions on his/her own behalf. 5. Hypertension/atrial fibrillation. Cardiac rate controlled. Continue Norvasc 10 mg daily, Lopressor 75 mg twice a day and  Demadex 10 mg twice a day. Monitor with increased activity 6. Diabetes mellitus. Hemoglobin A1c of 8.0. Continue Lantus insulin 18 units daily. Check blood sugars a.c. and at bedtime 7. Mild compression fractures T7 and T10 as well his chronic compression fractures with cemented vertebral augmentation T11, T12 and L1. No plans for a further vertebral plasty at this time and followup as outpatient  -advise the use of a soft lumbar-thoracic corset  -lidoderm patches 8. Left BKA- well shaped. Continue stump shrinker.   -prosthesis at some point after dc once improved from a stroke and back stand point. 10/03/2012, 1:12 PM   

## 2012-10-03 NOTE — Progress Notes (Signed)
Physical Therapy Treatment Patient Details Name: Aaron [name] MRN: [id number] DOB: 10-24-1924 Today's Date: 10/03/2012 Time: 4098-1191 PT Time Calculation (min): 18 min  PT Assessment / Plan / Recommendation Comments on Treatment Session  Pt s/p CVA with recent LBKA.  NSTEMI over weekend.  Pt. had difficulty with stand pivot transfer with RW secondary to right UE weakness.  Continue PT to address mobility.      Follow Up Recommendations  Post acute inpatient     Does the patient have the potential to tolerate intense rehabilitation  Yes, Recommend IP Rehab Screening  Barriers to Discharge        Equipment Recommendations  None recommended by PT    Recommendations for Other Services Rehab consult  Frequency Min 4X/week   Plan Discharge plan remains appropriate;Frequency remains appropriate    Precautions / Restrictions Precautions Precautions: Fall Restrictions Weight Bearing Restrictions: No   Pertinent Vitals/Pain VSS, No pain    Mobility  Bed Mobility Bed Mobility: Rolling Right;Right Sidelying to Sit;Sitting - Scoot to [place] of Bed Rolling Right: 4: Min assist;With rail Rolling Left: Not tested (comment) Right Sidelying to Sit: 4: Min assist;With rails;HOB elevated Supine to Sit: Not tested (comment) Sitting - Scoot to Edge of Bed: 4: Min assist Sit to Supine: Not Tested (comment) Details for Bed Mobility Assistance: Pt needed facilitation and verbal sequencing cues for trunk rotation and min verbal and tactile cues for side to sit.   Transfers Transfers: Sit to Stand;Stand to Sit;Squat Pivot Transfers Sit to Stand: 1: +2 Total assist;With upper extremity assist;From bed Sit to Stand: Patient Percentage: 50% Stand to Sit: 1: +2 Total assist;With upper extremity assist;With armrests;To chair/3-in-1 Stand to Sit: Patient Percentage: 40% Stand Pivot Transfers: 1: +2 Total assist Stand Pivot Transfers: Patient Percentage: 50% Squat Pivot Transfers: Not tested  (comment) Details for Transfer Assistance: Pt. needed cues and assist to bring weight forward over his foot for pivot transfer on RLE.  Pt needed assist to come to standing to RW, assist to hold right UE on RW and facilitation to keep right UE on RW throughtout transfer.  Pt. unable to get enought push to hop foot to pivot to chair.  Did more of a rotation onfoot and needed PT and tech to facilitate movement of RW and hip movement for patient to get around to recliner.   Ambulation/Gait Ambulation/Gait Assistance: Not tested (comment) Stairs: No Wheelchair Mobility Wheelchair Mobility: No    Exercises General Exercises - Upper Extremity Shoulder Flexion: AAROM;Right;10 reps;Seated Shoulder ABduction: AAROM;Right;10 reps;Seated General Exercises - Lower Extremity Quad Sets: AROM;Both;10 reps;Seated Long Arc Quad: AROM;Right;10 reps;Seated Heel Slides: AROM;10 reps;Seated;Right   PT Goals Acute Rehab PT Goals PT Goal Formulation: With patient PT Goal: Supine/Side to Sit - Progress: Progressing toward goal PT Goal: Sit at Edge Of Bed - Progress: Progressing toward goal PT Goal: Sit to Stand - Progress: Progressing toward goal PT Goal: Stand to Sit - Progress: Progressing toward goal PT Transfer Goal: Bed to Chair/Chair to Bed - Progress: Progressing toward goal PT Goal: Stand - Progress: Progressing toward goal  Visit Information  Last PT Received On: 10/03/12 Assistance Needed: +2    Subjective Data  Subjective: "I want to get up."   Cognition  Overall Cognitive Status: Appears within functional limits for tasks assessed/performed Arousal/Alertness: Awake/alert Orientation Level: Appears intact for tasks assessed Behavior During Session: [facility] for tasks performed    Balance  Static Sitting Balance Static Sitting - Balance Support: Bilateral upper extremity supported;Feet supported Static  Sitting - Level of Assistance: 5: Stand by assistance Dynamic Sitting Balance Dynamic  Sitting - Level of Assistance: Not tested (comment) Static Standing Balance Static Standing - Balance Support: Bilateral upper extremity supported;During functional activity Static Standing - Level of Assistance: 1: +2 Total assist Static Standing - Comment/# of Minutes: Pt. stood with RW.  Needed Tot A+2 (pt = 65%) to stand statically with RW.  Needed assist to hold right UE on RW as well as to support patient as patient was leaning posteriorly.    End of Session PT - End of Session Equipment Utilized During Treatment: Gait belt Activity Tolerance: Patient tolerated treatment well Patient left: in chair;with call bell/phone within reach;with family/visitor present Nurse Communication: Mobility status       INGOLD,Jaycelynn Knickerbocker 10/03/2012, 11:54 AM [facility] Acute Rehabilitation [phone] (pager)

## 2012-10-03 NOTE — Progress Notes (Signed)
Rehab admissions - I spoke with residents this morning.  Plans now are to hold on vertebroplasty for now and admit to inpatient rehab.  Vertebroplasty will be explored later perhaps on an outpatient basis.  Bed available and can admit to acute inpatient rehab today.  Call me for questions.  #[phone]

## 2012-10-03 NOTE — Progress Notes (Signed)
Attempted to see pt for swallowing therapy, pt to CT. Will continue to follow. Harlon Ditty, MA CCC-SLP [phone]

## 2012-10-03 NOTE — Progress Notes (Signed)
ANTICOAGULATION CONSULT NOTE - Follow Up Consult  Pharmacy Consult for coumadin  Indication: atrial fibrillation, CVA   Patient Measurements: Height: 5\' 8"  (172.7 cm) Weight: 148 lb 13 oz (67.5 kg) (bed scale) IBW/kg (Calculated) : 68.4   Vital Signs: Temp: 97.1 F (36.2 C) (10/16 1036) Temp src: Oral (10/16 1036) BP: 148/64 mmHg (10/16 1036) Pulse Rate: 91  (10/16 1036)  Labs:  Basename 10/03/12 0610 10/02/12 1732 10/02/12 0812 10/02/12 0124 10/01/12 2136 10/01/12 1352 10/01/12 0754 10/01/12 0530  HGB 11.3* -- -- 11.0* -- -- -- --  HCT 35.3* -- -- 34.4* -- -- 36.5* --  PLT 355 -- -- 319 -- -- 338 --  APTT -- -- -- -- -- -- -- --  LABPROT 20.3* -- -- 19.6* -- -- -- 16.7*  INR 1.81* -- -- 1.72* -- -- -- 1.39  HEPARINUNFRC -- -- -- -- -- -- -- --  CREATININE 1.18 -- -- 1.09 -- -- 1.14 --  CKTOTAL -- 45 48 44 -- -- -- --  CKMB -- 3.2 3.5 3.0 -- -- -- --  TROPONINI -- -- -- 4.55* 4.34* 4.75* -- --    Estimated Creatinine Clearance: 42.9 ml/min (by C-G formula based on Cr of 1.18).    Assessment: 78 yo male here with CVA and also noted with history of afib on coumadin PTA (2.5mg /day).  Patient was on Pradaxa but family prefers coumadin. INR today is up to 1.81 which is increased from yesterday despite dose being held yesterday.  He is to transfer to inpt rehab today and plan vertebroplasty later as an outpt if needed.  Goal of Therapy:  INR 2-3 Monitor platelets by anticoagulation protocol: Yes   Plan:  -Resume coumadin 2.5 mg daily -Will follow PT/INR daily for now - Monitor for s/s of bleeding  Talbert Cage, PharmD Clinical Pharmacist Pager [phone]  Thank you for allowing pharmacy to be part of this patients care team. 10/03/2012 12:56 PM

## 2012-10-03 NOTE — Interval H&P Note (Signed)
Aaron [name] was admitted today to Inpatient Rehabilitation with the diagnosis of embolic left frontal lobe infarct, recent left bka, thoracic-lumbar compression fx's.  The patient's history has been reviewed, patient examined, and there is no change in status.  Patient continues to be appropriate for intensive inpatient rehabilitation.  I have reviewed the patient's chart and labs.  Questions were answered to the patient's satisfaction.  Gaylynn Seiple T 10/03/2012, 9:10 PM

## 2012-10-03 NOTE — PMR Pre-admission (Signed)
PMR Admission Coordinator Pre-Admission Assessment  Patient: Aaron [name] is an 78 y.o., male MRN: [id number] DOB: November 20, 1924 Height: 5\' 8"  (172.7 cm) Weight: 67.5 kg (148 lb 13 oz) (bed scale)  Insurance Information HMO: No     PPO:       PCP:       IPA:       80/20:       OTHER:   PRIMARY: Medicare A/B      Policy#: [id number]      Subscriber: Aaron [name] CM Name:        Phone#:       Fax#:   Pre-Cert#:        Employer: Retired Benefits:  Phone #:       Name: Aaron [name]. Date: 02/17[birth year]91     Deduct: $1184      Out of Pocket Max: none      Life Max: unlimited CIR: 100%      SNF: 9 full  80 codays (but really used about 30 + SNF days at [facility] in [place]     LBD = 08/18/12 Outpatient: 80%     Co-Pay: 20% Home Health: 1005      Co-Pay: none DME: 80%     Co-Pay: 20% Providers: patient's choice  SECONDARY: [employer] of [place]      Policy#: [id number]      Subscriber: Aaron [name] CM Name:        Phone#:       Fax#:   Pre-Cert#:        Employer:  Retired Benefits:  Phone #: [phone]     Name:   Eff. Date:       Deduct:        Out of Pocket Max:        Life Max:   CIR:        SNF:   Outpatient:       Co-Pay:   Home Health:        Co-Pay:   DME:       Co-Pay:    Emergency Contact Information Contact Information    Name Relation Home Work Mobile   Aaron [name],Aaron [name]  [phone]       Current Medical History  Patient Admitting Diagnosis: left frontal lobe infarct, recent left BKA   History of Present Illness: A 78 y.o. RH-male with history of DM, L-AKA, A Fib, admitted on 09/25/12 with inability to move right side and inability to speak. Patient on chronic coumadin that had been held recently and INR sub-therapeutic at admission. CT head revealing remote right occipital and cerebellar infarcts with global atrophy. CTA head/neck with high grade stenosis of R-VA, distal VA essentially occluded, 50% stenosis prox L-ICA, moderate supraclinoid ICA stenosis bilaterally. MRI  brain done revealing acute infarct posterior left frontal lobe. Patient had improvement of symptoms in ED. Was able to start speaking and move RUE. 2 D echo done revealing mild LVH, EF 65-70%, bioprosthetic AV present. Patient has had gradual worsening of symptoms overnight. ST evaluation showed mild oral phase dysphagia and patient placed on D3 diet. PT evaluation done revealing balance deficits and problems with mobility. Neurology recommends changing patient to pradaxa for cardio embolic stroke. for MD, therapy team recommending CIR.   Patient had worsening neurological symptoms with likely extension of CVA.  Developed CHF and fluid overload (thought to be NSTEMI with enzyme elevation at first) and was transferred to 2600 stepdown.  Patient now with compression fracture.  Plans for vertebroplasty  were underway, but now on hold.  Vertebroplasty may be done later as an outpatient procedure.   Total: 2 =NIH  Past Medical History  Past Medical History  Diagnosis Date  . Hypertension   . Diabetes mellitus   . CHF (congestive heart failure)   . Coronary artery disease   . Atrial fibrillation   . PVD (peripheral vascular disease)   . S/P CABG x 1 2008  . S/P aortic valve replacement with porcine valve 2008    Family History  family history is not on file.  Prior Rehab/Hospitalizations: Was at [facility] in [place] after L BKA done 08/01/12 for about a month.  Was home about 10 days prior to current hospitalization.  Was in SNF 2 yrs ago as well for stent in leg.  Current Medications  Current facility-administered medications:acetaminophen (TYLENOL) tablet 650 mg, 650 mg, Oral, Q6H PRN, Elfredia Nevins, MD, 650 mg at 10/02/12 0145;  albuterol (PROVENTIL) (5 MG/ML) 0.5% nebulizer solution 2.5 mg, 2.5 mg, Nebulization, TID PRN, Almyra Deforest, MD, 2.5 mg at 10/03/12 0525;  amLODipine (NORVASC) tablet 10 mg, 10 mg, Oral, Daily, Elfredia Nevins, MD, 10 mg at 10/03/12 1038 aspirin EC tablet  81 mg, 81 mg, Oral, Daily, Elfredia Nevins, MD, 81 mg at 10/03/12 1038;  atorvastatin (LIPITOR) tablet 80 mg, 80 mg, Oral, q1800, Almyra Deforest, MD, 80 mg at 10/02/12 1840;  bisacodyl (DULCOLAX) suppository 10 mg, 10 mg, Rectal, Daily PRN, Elfredia Nevins, MD, 10 mg at 09/26/12 1630;  dextrose (GLUTOSE) 40 % oral gel 37.5 g, 1 Tube, Oral, PRN, Elfredia Nevins, MD, 37.5 g at 09/29/12 0845 guaiFENesin (ROBITUSSIN) 100 MG/5ML solution 100 mg, 5 mL, Oral, Q4H PRN, Fnu Nutan, MD;  insulin aspart (novoLOG) injection 0-20 Units, 0-20 Units, Subcutaneous, TID WC, Elfredia Nevins, MD, 4 Units at 10/03/12 1205;  insulin glargine (LANTUS) injection 18 Units, 18 Units, Subcutaneous, QHS, Emory Nani Skillern, MD, 18 Units at 10/02/12 2127;  metoprolol tartrate (LOPRESSOR) tablet 75 mg, 75 mg, Oral, BID, Elfredia Nevins, MD, 75 mg at 10/03/12 1039 ondansetron (ZOFRAN) injection 4 mg, 4 mg, Intravenous, Q6H PRN, Elfredia Nevins, MD, 4 mg at 09/29/12 0839;  pantoprazole (PROTONIX) EC tablet 40 mg, 40 mg, Oral, QHS, Fnu Nutan, MD, 40 mg at 10/02/12 2127;  polyethylene glycol (MIRALAX / GLYCOLAX) packet 17 g, 17 g, Oral, Daily, Almyra Deforest, MD, 17 g at 10/01/12 1040;  sodium chloride 0.9 % bolus 500 mL, 500 mL, Intravenous, Once, Almyra Deforest, MD torsemide (DEMADEX) tablet 10 mg, 10 mg, Oral, BID, Almyra Deforest, MD, 10 mg at 10/03/12 1042;  warfarin (COUMADIN) tablet 2.5 mg, 2.5 mg, Oral, q1800, Lars Mage, MD;  Warfarin - Pharmacist Dosing Inpatient, , Does not apply, q1800, Lars Mage, MD;  zolpidem (AMBIEN) tablet 5 mg, 5 mg, Oral, QHS PRN, Leodis Sias, MD, 5 mg at 10/02/12 2341  Patients Current Diet: Carb Control  Precautions / Restrictions Precautions Precautions: Fall Precaution Comments: LLE BKA Restrictions Weight Bearing Restrictions: No Other Position/Activity Restrictions: LLE BKA   Prior Activity Level Community (5-7x/wk): Went outside daily in w/c.  Went out into community 2-3 X a week.  Home  Assistive Devices / Equipment Home Assistive Devices/Equipment: Wheelchair;Eyeglasses;Dentures (specify type);CBG Meter;Shower chair with [employer] Equipment: Walker - rolling;Bedside commode/3-in-1;Tub transfer bench;Wheelchair - manual  Prior Functional Level Prior Function Level of Independence: Needs assistance Needs Assistance: Bathing;Dressing;Transfers Bath: Minimal Dressing: Minimal Gait Assistance: Daughter reports he was using RW to "  bunny hop" after his amputation but with caregiver present Transfer Assistance: superivsion from personal care attendant Able to Take Stairs?: No Driving: No Vocation: Retired  Current Functional Level Cognition  Arousal/Alertness: Awake/alert Overall Cognitive Status: Appears within functional limits for tasks assessed Overall Cognitive Status: Appears within functional limits for tasks assessed/performed Orientation Level: Oriented to person;Oriented to place;Oriented to time Cognition - Other Comments: frustrated at times especially since he cannot use his RUE as he wants to    Extremity Assessment (includes Sensation/Coordination)  RUE ROM/Strength/Tone: Deficits RUE ROM/Strength/Tone Deficits: Grip strength 2+/5. Wrist and elbow 3-/5. Shoulder 2+/5. RUE Sensation: Deficits RUE Sensation Deficits: "a little numb" RUE Coordination: Deficits RUE Coordination Deficits: both FM and GM deficits  RLE ROM/Strength/Tone: Deficits RLE ROM/Strength/Tone Deficits: generally weak, grossly 4/5 RLE Sensation: WFL - Light Touch    ADLs  Grooming: Performed;Brushing hair;Wash/dry face;Minimal assistance Where Assessed - Grooming: Unsupported sitting Upper Body Bathing: Simulated;Moderate assistance Where Assessed - Upper Body Bathing: Supported sitting Lower Body Bathing: Simulated;Moderate assistance (+2 for standing) Where Assessed - Lower Body Bathing: Supported sit to stand Upper Body Dressing: Simulated;Moderate  assistance Where Assessed - Upper Body Dressing: Supported sitting Lower Body Dressing: Performed;Moderate assistance Where Assessed - Lower Body Dressing: Unsupported sitting Toilet Transfer: +2 Total assistance Toilet Transfer: Patient Percentage: 60% Toilet Transfer Method:  (using sera plus) Toilet Transfer Equipment: Other (comment) (to recliner.) Equipment Used: Gait belt Transfers/Ambulation Related to ADLs: +2 total A (pt=60%) squat pivot to right side ADL Comments: Pt was able to A in donning shoe and adjusting sock. Pt does tend to lean posteriorly during unsupported sitting. Fatigues very quickly.    Mobility  Bed Mobility: Rolling Right;Right Sidelying to Sit;Sitting - Scoot to Edge of Bed Rolling Right: 4: Min assist;With rail Rolling Left: Not tested (comment) Rolling Left: Patient Percentage: 70% Right Sidelying to Sit: 4: Min assist;With rails;HOB elevated Supine to Sit: Not tested (comment) Sitting - Scoot to Edge of Bed: 4: Min assist Sit to Supine: Not Tested (comment)    Transfers  Transfers: Sit to Stand;Stand to Sit;Squat Pivot Transfers Sit to Stand: 1: +2 Total assist;With upper extremity assist;From bed Sit to Stand: Patient Percentage: 50% Stand to Sit: 1: +2 Total assist;With upper extremity assist;With armrests;To chair/3-in-1 Stand to Sit: Patient Percentage: 40% Stand Pivot Transfers: 1: +2 Total assist Stand Pivot Transfers: Patient Percentage: 50% Squat Pivot Transfers: Not tested (comment) Squat Pivot Transfers: Patient Percentage: 50% Transfer via Lift Equipment: Human resources [name] / Gait / Stairs / Psychologist, prison and probation services  Ambulation/Gait Ambulation/Gait Assistance: Not tested (comment) Stairs: No Corporate treasurer: No    Posture / Games developer Sitting - Balance Support: Bilateral upper extremity supported;Feet supported Static Sitting - Level of Assistance: 5: Stand by assistance Static  Sitting - Comment/# of Minutes: Continues to slump posteriorly with kyphosis in sitting position causing most of weight to be posterior to body.   Dynamic Sitting Balance Dynamic Sitting - Balance Support: Bilateral upper extremity supported Dynamic Sitting - Level of Assistance: Not tested (comment) Dynamic Sitting - Comments: falls posteriorly with MMT of lower extremities Static Standing Balance Static Standing - Balance Support: Bilateral upper extremity supported;During functional activity Static Standing - Level of Assistance: 1: +2 Total assist Static Standing - Comment/# of Minutes: Pt. stood with RW.  Needed Tot A+2 (pt = 65%) to stand statically with RW.  Needed assist to hold right UE on RW as well as to support patient as patient  was leaning posteriorly.       Previous Home Environment Living Arrangements: Spouse/significant other Lives With: Alone Available Help at Discharge: Personal care attendant;Available 24 hours/day Type of Home: House Home Layout: One level Home Access: Ramped entrance Bathroom Shower/Tub: Engineer, manufacturing systems: Standard Home Care Services: Yes Type of Home Care Services: Other (Comment);Homehealth aide;Home PT ([facility]) Home Care Agency (if known): [facility] Additional Comments: Pt had L BKA  in August 2013 per daugther report and [facility] about [facility].  Discharge Living Setting Plans for Discharge Living Setting: Patient's home;Alone Type of Home at Discharge: House Discharge Home Layout: Two level;Able to live on main level with bedroom/bathroom Discharge Home Access: Ramped entrance (Ramp at front entry and 3 steps a back entry.) Do you have any problems obtaining your medications?: No  Social/Family/Support Systems Patient Roles: Parent Contact Information: Aaron [name] - Dtr (c) [phone] Anticipated Caregiver: Has hired caregiver 10 am - 2 pm daily, but can increase to 24 hr care as needed (Has 3 daughters  who share care on weekends.) Ability/Limitations of Caregiver: Dtr's assist with weekend coverage. Caregiver Availability: Other (Comment) (Can get 24 hr care as needed.) Discharge Plan Discussed with Primary Caregiver: Yes Is Caregiver In Agreement with Plan?: Yes Does Caregiver/Family have Issues with Lodging/Transportation while Pt is in Rehab?: No  Goals/Additional Needs Patient/Family Goal for Rehab: PT min/mod A, OT mod/max A, ST min A goals Expected length of stay: 2-3 weeks Cultural Considerations: [facility] Dietary Needs: Carb Mod Med Cal thin liquids Equipment Needs: TBD Pt/Family Agrees to Admission and willing to participate: Yes Program Orientation Provided & Reviewed with Pt/Caregiver Including Roles  & Responsibilities: Yes  Patient Condition: Please see physician update to information in consult dated 09/26/12.  Preadmission Screen Completed By:  Trish Mage, 10/03/2012 1:39 PM ______________________________________________________________________   Discussed status with Dr. Riley Kill on 10/03/12 at 1354 and received telephone approval for admission today.  Admission Coordinator:  Trish Mage, time1354/Date10/16/13

## 2012-10-03 NOTE — Progress Notes (Signed)
Patient admitted with friable skin. Patient has bruising to all extremities, brown discoloration to bilateral arms and legs.  Right forearm with Allevyn dressing intact for skin tear to area,Scrotum is red, intact, left inner groin with abrasion, question if from lip of urinal, Sacrum and upper buttocks with reddish purple discoloration, slow to blanch, intact, inner buttocks reddened with peeling skin,utilizing skin steps 1-3, antifungal powder to scrotum and inner groin for redness, Left BKA  done in 07/2012 with stump shrinker and Allevyn dressing intact, incision healed, , skin is reddish purple and very fragile/friable, some peeling of the skin noted, replaced dressing and stump shrinker, right leg with brown discoloration and bruising, toe nails are yellow, dark, edges are jagged, toes are shiny red, +1 pedal pulse, sole of foot with dry peeling skin, heel is deep red, boggy, blanchable and intact.  Roberts-VonCannon, Carolanne Mercier Elon Jester

## 2012-10-03 NOTE — Progress Notes (Signed)
Internal Medicine Teaching Service Attending Note Date: 10/03/2012  Patient name: Aaron [name]  Medical record number: [id number]  Date of birth: 08-28-1924    This patient has been seen and discussed with the house staff. Please see their note for complete details. I concur with their findings with the following additions/corrections: Patient will be transferred to inpatient rehabilitation today. We will restart patient's Coumadin. Vertebroplasty which is currently on hold due to INR being more than 1.5 can be pursued as outpatient if need be. We will try to control the back pain with pain medications in the meanwhile.   Lars Mage 10/03/2012, 12:01 PM

## 2012-10-03 NOTE — Plan of Care (Signed)
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  Functional Problem List  Patient demonstrates impairments in the following areas: Balance, Bladder, Bowel, Cognition, Edema, Endurance, Medication Management, Motor, Nutrition, Pain, Safety, Sensory  and Skin Integrity  Basic ADL's: grooming, bathing, dressing and toileting Advanced ADL's: N/A  Transfers:  bed mobility, bed to chair, toilet, tub/shower and car Locomotion:  ambulation and wheelchair mobility  Additional Impairments:  Swallowing, Communication  expression and Social Cognition   memory  Anticipated Outcomes Item Anticipated Outcome  Eating/Swallowing  Min assist, Mod I for aspiration precautions  Basic self-care  Min assist transfers and LB bathing and dressing, supervision seated tasks  Tolieting    Bowel/Bladder  Min assist with toileting  Transfers  Min assist  Locomotion  Mod assist controlled in controlled environment W/c for home requiring supervision   Communication  supervision  Cognition  Min A-supervision  Pain  3 or less on scale of 1-10  Safety/Judgment  supervision  Other     Therapy Plan: PT Frequency: 5 out of 7 days OT Frequency: 1-2 X/day, 60-90 minutes SLP Frequency: 1-2 X/day, 30-60 minutes;5 out of 7 days   Team Interventions: Item RN PT OT SLP SW TR Other  Self Care/Advanced ADL Retraining  X x      Neuromuscular Re-Education  X x      Therapeutic Activities  X x x     UE/LE Strength Training/ROM  X x      UE/LE Coordination Activities  X x      Visual/Perceptual Remediation/Compensation         DME/Adaptive Equipment Instruction  X x      Therapeutic Exercise  X x      Balance/Vestibular Training  X x      Patient/Family Education x X x x     Cognitive Remediation/Compensation    x       Functional Mobility Training  X x      Ambulation/Gait Training  X       Higher education careers adviser    x     Speech/Language Facilitation    x     Bladder Management x        Bowel Management x        Disease Management/Prevention x        Pain Management x        Medication Management x        Skin Care/Wound Management x        Splinting/Orthotics         Discharge Planning x X x x     Psychosocial Support x  x                         Team Discharge Planning: Destination:  Home with 24/7 assistance Projected Follow-up:  Home Health; SLP TBD Projected Equipment Needs:  Patient has all necessary DME Patient/family involved in discharge planning:  Yes  MD ELOS: 3 wks Medical Rehab Prognosis:  Good Assessment: 78 yo male with recent L BKA admitted  for R HP due to CVA.  Now requiring CIR level PT,OT,SLP, 24/7 rehab RN and MD

## 2012-10-03 NOTE — H&P (Signed)
Physical Medicine and Rehabilitation Admission H&P    Chief Complaint  Patient presents with  . Code Stroke  : HPI: Aaron [name] is a 78 y.o. RH-male with history of DM with peripheral neuropathy, L-AKA 08/01/2012 at [facility] and was discharged to [facility] skilled nursing facility before returning home with 24-hour care, A Fib and maintained on chronic Coumadin Admitted on 09/25/12 with inability to move right side and inability to speak. Patient on chronic coumadin that had been held recently after noted INR 3.1 and INR sub-therapeutic at admission of 1.35. CT head revealing remote right occipital and cerebellar infarcts with global atrophy. CTA head/neck with high grade stenosis of R-VA, distal VA essentially occluded, 50% stenosis prox L-ICA, moderate supraclinoid ICA stenosis bilaterally. MRI brain done revealing acute infarct posterior left frontal lobe. Patient had improvement of symptoms in ED. Was able to start speaking and move RUE. 2 D echo done revealing mild LVH, EF 65-70%, bioprosthetic AV present. Neurology was consulted there was a first recommendations to begin Pradaxa but the family still preferred Coumadin thus Coumadin was resumed as well as the addition of low-dose aspirin. Patient did not receive TPA. Patient has had gradual worsening of symptoms overnight. ST evaluation showed mild oral phase dysphagia and patient placed on D3 diet. Noted patient with complaints of low back pain her x-rays and imaging revealed mild compression fractures of T7 and T10 of indeterminate age as well as compression chronic fractures T11, T12 and L1 . There was initially planned for possible vertebroplasty which had been held due to INR being more than 1.5. It was later advised to hold on vertebroplasty at this time and to a rehabilitation completed for CVA and discuss outpatient vertebroplasty. PT evaluation done revealing balance deficits and problems with mobility. Neurology  recommends changing patient to pradaxa for cardio embolic stroke. for MD, therapy team recommending CIR. patient was felt to be a good candidate for inpatient rehabilitation services and was admitted for comprehensive rehabilitation program.  Review of Systems  HENT: Negative for hearing loss.  Dry and sore mouth.  Eyes: Negative for blurred vision and double vision.  Respiratory: Negative for cough and shortness of breath.  Cardiovascular: Negative for chest pain and palpitations.  Gastrointestinal: Positive for nausea, vomiting and constipation. Negative for abdominal pain.  Genitourinary: Negative for urgency and frequency.  Musculoskeletal: Positive for back pain (muscle spasms.) and falls (fall a week ago striking mid back.).  Neurological: Positive for speech change and focal weakness. Negative for dizziness and headaches.  Psychiatric/Behavioral   Past Medical History  Diagnosis Date  . Hypertension   . Diabetes mellitus   . CHF (congestive heart failure)   . Coronary artery disease   . Atrial fibrillation   . PVD (peripheral vascular disease)   . S/P CABG x 1 2008  . S/P aortic valve replacement with porcine valve 2008   Past Surgical History  Procedure Date  . Below knee leg amputation 07/2012  . Cataract extraction w/ intraocular lens implant   . Abdominal surgery   . Hernia repair     x5  . Back surgery     vertebroplasty  . Coronary artery bypass graft 2008   History reviewed. No pertinent family history. Social History:  reports that he has quit smoking. He uses smokeless tobacco. He reports that he does not drink alcohol or use illicit drugs. Allergies:  Allergies  Allergen Reactions  . Metformin And Related Hives  . Penicillins Hives and Swelling  Medications Prior to Admission  Medication Sig Dispense Refill  . Calcium Carbonate-Vitamin D (CALCIUM 600+D) 600-400 MG-UNIT per tablet Take 1 tablet by mouth daily.      . Cholecalciferol (VITAMIN D-3) 1000  UNITS CAPS Take 1,000 Units by mouth at bedtime.      . DULoxetine (CYMBALTA) 30 MG capsule Take 30 mg by mouth at bedtime.       . metoprolol (LOPRESSOR) 50 MG tablet Take 75 mg by mouth 2 (two) times daily.      Marland Kitchen torsemide (DEMADEX) 10 MG tablet Take 10 mg by mouth 2 (two) times daily.       Marland Kitchen DISCONTD: amLODipine (NORVASC) 5 MG tablet Take 5 mg by mouth 2 (two) times daily.      Marland Kitchen DISCONTD: HYDROCODONE-ACETAMINOPHEN PO Take 1-2 tablets by mouth every 6 (six) hours as needed. For pain      . DISCONTD: insulin aspart (NOVOLOG) 100 UNIT/ML injection Inject 3-6 Units into the skin 3 (three) times daily before meals. 6 units every morning, 3 units with lunch and 5 units with dinner      . DISCONTD: losartan (COZAAR) 100 MG tablet Take 50 mg by mouth 2 (two) times daily.      Marland Kitchen DISCONTD: losartan (COZAAR) 50 MG tablet Take 50 mg by mouth 2 (two) times daily.      Marland Kitchen DISCONTD: nystatin (MYCOSTATIN/NYSTOP) 100000 UNIT/GM POWD Apply topically 2 (two) times daily as needed. Apply to buttocks for rash as needed      . DISCONTD: simvastatin (ZOCOR) 40 MG tablet Take 40 mg by mouth every evening.      Marland Kitchen DISCONTD: warfarin (COUMADIN) 2.5 MG tablet Take 2.5 mg by mouth daily.        Home: Home Living Lives With: Alone Available Help at Discharge: Personal care attendant;Available 24 hours/day Type of Home: House Home Access: Ramped entrance Home Layout: One level Bathroom Shower/Tub: Engineer, manufacturing systems: Standard Home Adaptive Equipment: Walker - rolling;Bedside commode/3-in-1;Tub transfer bench;Wheelchair - manual Additional Comments: Pt had L BKA  in August 2013 per daugther report and [facility] about 10 days ago.   Functional History: Prior Function Bath: Minimal Dressing: Minimal Able to Take Stairs?: No Driving: No Vocation: Retired  Functional Status:  Mobility: Bed Mobility Bed Mobility: Rolling Right;Right Sidelying to Sit;Sitting - Scoot to Edge of  Bed Rolling Right: 4: Min assist;With rail Rolling Left: Not tested (comment) Rolling Left: Patient Percentage: 70% Right Sidelying to Sit: 4: Min assist;With rails;HOB elevated Supine to Sit: Not tested (comment) Sitting - Scoot to Edge of Bed: 4: Min assist Sit to Supine: Not Tested (comment) Transfers Transfers: Sit to Stand;Stand to Sit;Squat Pivot Transfers Sit to Stand: 1: +2 Total assist;With upper extremity assist;From bed Sit to Stand: Patient Percentage: 50% Stand to Sit: 1: +2 Total assist;With upper extremity assist;With armrests;To chair/3-in-1 Stand to Sit: Patient Percentage: 40% Stand Pivot Transfers: 1: +2 Total assist Stand Pivot Transfers: Patient Percentage: 50% Squat Pivot Transfers: Not tested (comment) Squat Pivot Transfers: Patient Percentage: 50% Transfer via Lift Equipment: Hydrographic surveyor Ambulation/Gait Ambulation/Gait Assistance: Not tested (comment) Stairs: No Wheelchair Mobility Wheelchair Mobility: No  ADL: ADL Grooming: Performed;Brushing hair;Wash/dry face;Minimal assistance Where Assessed - Grooming: Unsupported sitting Upper Body Bathing: Simulated;Moderate assistance Where Assessed - Upper Body Bathing: Supported sitting Lower Body Bathing: Simulated;Moderate assistance (+2 for standing) Where Assessed - Lower Body Bathing: Supported sit to stand Upper Body Dressing: Simulated;Moderate assistance Where Assessed - Upper Body Dressing:  Supported sitting Lower Body Dressing: Performed;Moderate assistance Where Assessed - Lower Body Dressing: Unsupported sitting Toilet Transfer: +2 Total assistance Toilet Transfer Method:  (using sera plus) Toilet Transfer Equipment: Other (comment) (to recliner.) Equipment Used: Gait belt Transfers/Ambulation Related to ADLs: +2 total A (pt=60%) squat pivot to right side ADL Comments: Pt was able to A in donning shoe and adjusting sock. Pt does tend to lean posteriorly during unsupported sitting. Fatigues very  quickly.  Cognition: Cognition Overall Cognitive Status: Appears within functional limits for tasks assessed Arousal/Alertness: Awake/alert Orientation Level: Oriented to person;Oriented to place;Oriented to time Cognition Overall Cognitive Status: Appears within functional limits for tasks assessed/performed Arousal/Alertness: Awake/alert Orientation Level: Appears intact for tasks assessed Behavior During Session: [facility] for tasks performed Cognition - Other Comments: frustrated at times especially since he cannot use his RUE as he wants to   Blood pressure 148/64, pulse 91, temperature 97.1 F (36.2 C), temperature source Oral, resp. rate 20, height 5\' 8"  (1.727 m), weight 67.5 kg (148 lb 13 oz), SpO2 97.00%. Physical Exam  Constitutional: He is oriented to person, place, and time. He appears well-developed and well-nourished.  HENT:  Head: Normocephalic and atraumatic.  Eyes: Pupils are equal, round, and reactive to light.  Neck: Normal range of motion. Neck supple.  Cardiovascular: An irregularly irregular rhythm present. Tachycardia present.  Pulmonary/Chest: Effort normal and breath sounds normal.  Abdominal: Soft. Bowel sounds are normal. He exhibits no distension. There is no tenderness.  Musculoskeletal: He exhibits edema (right hand).  Neurological: He is alert and oriented to person, place, and time.  Alert. Easily frustrated.    Very dysarthric speech. Able to follow basic commands without difficulty. Needed some cueing to recall DOB and current month. RUE 2-3 prox to 0/5 at HI.  RLE 3+ to 4+/5. No gross sensory abnl. Right 7 and tongue deviation. No gross visual abnormalities. Skin:  Left BK is well healed and leg itself is extremely well shaped.  Results for orders placed during the hospital encounter of 09/25/12 (from the past 48 hour(s))  TROPONIN I     Status: Abnormal   Collection Time   10/01/12  1:52 PM      Component Value Range Comment   Troponin I 4.75 (*)  <0.30 ng/mL   CK TOTAL AND CKMB     Status: Normal   Collection Time   10/01/12  1:52 PM      Component Value Range Comment   Total CK 54  7 - 232 U/L    CK, MB 4.0  0.3 - 4.0 ng/mL    Relative Index RELATIVE INDEX IS INVALID  0.0 - 2.5   GLUCOSE, CAPILLARY     Status: Abnormal   Collection Time   10/01/12  4:37 PM      Component Value Range Comment   Glucose-Capillary 254 (*) 70 - 99 mg/dL   TROPONIN I     Status: Abnormal   Collection Time   10/01/12  9:36 PM      Component Value Range Comment   Troponin I 4.34 (*) <0.30 ng/mL   CK TOTAL AND CKMB     Status: Normal   Collection Time   10/01/12  9:36 PM      Component Value Range Comment   Total CK 48  7 - 232 U/L    CK, MB 3.1  0.3 - 4.0 ng/mL    Relative Index RELATIVE INDEX IS INVALID  0.0 - 2.5   GLUCOSE, CAPILLARY  Status: Abnormal   Collection Time   10/01/12  9:56 PM      Component Value Range Comment   Glucose-Capillary 111 (*) 70 - 99 mg/dL   TROPONIN I     Status: Abnormal   Collection Time   10/02/12  1:24 AM      Component Value Range Comment   Troponin I 4.55 (*) <0.30 ng/mL   CK TOTAL AND CKMB     Status: Normal   Collection Time   10/02/12  1:24 AM      Component Value Range Comment   Total CK 44  7 - 232 U/L    CK, MB 3.0  0.3 - 4.0 ng/mL    Relative Index RELATIVE INDEX IS INVALID  0.0 - 2.5   PROTIME-INR     Status: Abnormal   Collection Time   10/02/12  1:24 AM      Component Value Range Comment   Prothrombin Time 19.6 (*) 11.6 - 15.2 seconds    INR 1.72 (*) 0.00 - 1.49   BASIC METABOLIC PANEL     Status: Abnormal   Collection Time   10/02/12  1:24 AM      Component Value Range Comment   Sodium 136  135 - 145 mEq/L    Potassium 4.2  3.5 - 5.1 mEq/L    Chloride 101  96 - 112 mEq/L    CO2 28  19 - 32 mEq/L    Glucose, Bld 120 (*) 70 - 99 mg/dL    BUN 25 (*) 6 - 23 mg/dL    Creatinine, Ser 1.61  0.50 - 1.35 mg/dL    Calcium 8.5  8.4 - 09.6 mg/dL    GFR calc non Af Amer 59 (*) >90 mL/min     GFR calc Af Amer 69 (*) >90 mL/min   CBC     Status: Abnormal   Collection Time   10/02/12  1:24 AM      Component Value Range Comment   WBC 10.5  4.0 - 10.5 K/uL    RBC 3.90 (*) 4.22 - 5.81 MIL/uL    Hemoglobin 11.0 (*) 13.0 - 17.0 g/dL    HCT 04.5 (*) 40.9 - 52.0 %    MCV 88.2  78.0 - 100.0 fL    MCH 28.2  26.0 - 34.0 pg    MCHC 32.0  30.0 - 36.0 g/dL    RDW 81.1 (*) 91.4 - 15.5 %    Platelets 319  150 - 400 K/uL   CK TOTAL AND CKMB     Status: Normal   Collection Time   10/02/12  8:12 AM      Component Value Range Comment   Total CK 48  7 - 232 U/L    CK, MB 3.5  0.3 - 4.0 ng/mL    Relative Index RELATIVE INDEX IS INVALID  0.0 - 2.5   GLUCOSE, CAPILLARY     Status: Abnormal   Collection Time   10/02/12  8:21 AM      Component Value Range Comment   Glucose-Capillary 217 (*) 70 - 99 mg/dL   GLUCOSE, CAPILLARY     Status: Abnormal   Collection Time   10/02/12 12:10 PM      Component Value Range Comment   Glucose-Capillary 213 (*) 70 - 99 mg/dL   GLUCOSE, CAPILLARY     Status: Abnormal   Collection Time   10/02/12  4:18 PM      Component Value Range Comment  Glucose-Capillary 204 (*) 70 - 99 mg/dL    Comment 1 Notify RN     CK TOTAL AND CKMB     Status: Normal   Collection Time   10/02/12  5:32 PM      Component Value Range Comment   Total CK 45  7 - 232 U/L    CK, MB 3.2  0.3 - 4.0 ng/mL    Relative Index RELATIVE INDEX IS INVALID  0.0 - 2.5   GLUCOSE, CAPILLARY     Status: Abnormal   Collection Time   10/02/12  8:23 PM      Component Value Range Comment   Glucose-Capillary 220 (*) 70 - 99 mg/dL    Comment 1 Notify RN      Comment 2 Documented in Chart     PROTIME-INR     Status: Abnormal   Collection Time   10/03/12  6:10 AM      Component Value Range Comment   Prothrombin Time 20.3 (*) 11.6 - 15.2 seconds    INR 1.81 (*) 0.00 - 1.49   BASIC METABOLIC PANEL     Status: Abnormal   Collection Time   10/03/12  6:10 AM      Component Value Range Comment    Sodium 133 (*) 135 - 145 mEq/L    Potassium 4.5  3.5 - 5.1 mEq/L    Chloride 96  96 - 112 mEq/L    CO2 28  19 - 32 mEq/L    Glucose, Bld 367 (*) 70 - 99 mg/dL    BUN 29 (*) 6 - 23 mg/dL    Creatinine, Ser 2.95  0.50 - 1.35 mg/dL    Calcium 8.5  8.4 - 62.1 mg/dL    GFR calc non Af Amer 54 (*) >90 mL/min    GFR calc Af Amer 63 (*) >90 mL/min   CBC     Status: Abnormal   Collection Time   10/03/12  6:10 AM      Component Value Range Comment   WBC 11.5 (*) 4.0 - 10.5 K/uL    RBC 3.98 (*) 4.22 - 5.81 MIL/uL    Hemoglobin 11.3 (*) 13.0 - 17.0 g/dL    HCT 30.8 (*) 65.7 - 52.0 %    MCV 88.7  78.0 - 100.0 fL    MCH 28.4  26.0 - 34.0 pg    MCHC 32.0  30.0 - 36.0 g/dL    RDW 84.6 (*) 96.2 - 15.5 %    Platelets 355  150 - 400 K/uL   GLUCOSE, CAPILLARY     Status: Abnormal   Collection Time   10/03/12  6:31 AM      Component Value Range Comment   Glucose-Capillary 372 (*) 70 - 99 mg/dL    Dg Chest 2 View  95/28/4132  *RADIOLOGY REPORT*  Clinical Data: Shortness of breath, CHF exacerbation  CHEST - 2 VIEW  Comparison: 09/29/2012  Findings: Enlargement of cardiac silhouette post CABG and AVR. Pulmonary vascular congestion. Mild pulmonary edema, improving. Bibasilar atelectasis and decrease in lung volumes noted. Calcified tortuous aorta. Small bibasilar effusions. No pneumothorax. Bones appear demineralized with compression deformities and vertebroplasties at 3 upper lumbar levels. Epicardial pacing wires noted.  IMPRESSION: Improving CHF. Mild bibasilar atelectasis and small pleural effusions.   Original Report Authenticated By: Lollie Marrow, M.D.    Ct Thoracic Spine Wo Contrast  10/02/2012  *RADIOLOGY REPORT*  Clinical Data: T10 compression fracture.  Rule out retropulsion.  CT THORACIC  SPINE WITHOUT CONTRAST  Technique:  Multidetector CT imaging of the thoracic spine was performed without intravenous contrast administration. Multiplanar CT image reconstructions were also generated   Comparison: Lumbar radiographs 09/25/2012.  Findings: Mild compression fracture of T10.  No prior studies are available prior to  09/30/2012.  This is of indeterminate age but most likely a mild chronic fracture.  No retropulsion into the canal.  Mild compression fracture of T7.  This could be a recent fracture. No retropulsion into the spinal canal.  Cement vertebral augmentation of T11 from the right pedicle approach.  Cement is in good position.  Cement vertebral augmentation of T12 from the left pedicle approach.  There is mild epidural cement on the left.  This is not causing significant spinal stenosis.  Cement vertebral augmentation of L1-4 right pedicle approach with satisfactory cement positioning.  No evidence of metastatic disease to the spine.  There are mild to moderately large pleural effusions bilaterally.  Extensive coronary artery calcification and atherosclerotic calcification is noted.  IMPRESSION: Mild compression fractures of T7 and T10 of  indeterminate age. MRI may be helpful to evaluate for bone marrow edema.  Chronic compression fracture with cement vertebral augmentation T11, T12 and L1.   Original Report Authenticated By: Camelia Phenes, M.D.     Post Admission Physician Evaluation: 1. Functional deficits secondary  to embolic left frontal lobe infarct, recent left BKA, thoracic and lumbar compression fx's. 2. Patient is admitted to receive collaborative, interdisciplinary care between the physiatrist, rehab nursing staff, and therapy team. 3. Patient's level of medical complexity and substantial therapy needs in context of that medical necessity cannot be provided at a lesser intensity of care such as a SNF. 4. Patient has experienced substantial functional loss from his/her baseline which was documented above under the "Functional History" and "Functional Status" headings.  Judging by the patient's diagnosis, physical exam, and functional history, the patient has potential for  functional progress which will result in measurable gains while on inpatient rehab.  These gains will be of substantial and practical use upon discharge  in facilitating mobility and self-care at the household level. 5. Physiatrist will provide 24 hour management of medical needs as well as oversight of the therapy plan/treatment and provide guidance as appropriate regarding the interaction of the two. 6. 24 hour rehab nursing will assist with bladder management, bowel management, safety, skin/wound care, disease management, medication administration, pain management and patient education  and help integrate therapy concepts, techniques,education, etc. 7. PT will assess and treat for:  fxnl mobility, NMR, pain mgt, ROM, safety, adaptive equipment.  Goals are:  Supervision to minimal assistance with basic transfers and wc mobility. 8. OT will assess and treat for: UES, ROM, NMR, ADL's, safety, adaptive techniques and equipment.   Goals are: supervision to moderate assist. 9. SLP will assess and treat for: communicationa and speech intelligibility.  Goals are: supervision. 10. Case Management and Social Worker will assess and treat for psychological issues and discharge planning. 11. Team conference will be held weekly to assess progress toward goals and to determine barriers to discharge. 12. Patient will receive at least 3 hours of therapy per day at least 5 days per week. 13. ELOS: 2-3 weeks      Prognosis:  excellent   Medical Problem List and Plan: 1. Left frontal lobe embolic infarct/recent left below-knee amputation 07/22/2012 2. DVT Prophylaxis/Anticoagulation: Chronic Coumadin therapy. Monitor for any bleeding episodes 3. Pain Management: Tylenol as needed. Monitor with increased mobility 4.  Neuropsych: This patient is capable of making decisions on his/her own behalf. 5. Hypertension/atrial fibrillation. Cardiac rate controlled. Continue Norvasc 10 mg daily, Lopressor 75 mg twice a day and  Demadex 10 mg twice a day. Monitor with increased activity 6. Diabetes mellitus. Hemoglobin A1c of 8.0. Continue Lantus insulin 18 units daily. Check blood sugars a.c. and at bedtime 7. Mild compression fractures T7 and T10 as well his chronic compression fractures with cemented vertebral augmentation T11, T12 and L1. No plans for a further vertebral plasty at this time and followup as outpatient  -advise the use of a soft lumbar-thoracic corset  -lidoderm patches 8. Left BKA- well shaped. Continue stump shrinker.   -prosthesis at some point after dc once improved from a stroke and back stand point. 10/03/2012, 1:12 PM

## 2012-10-03 NOTE — Progress Notes (Signed)
INR 1.8. Unable to do VP?KP Continue to hold Coumadin Renew NPO p MN order in hopes that INR will be down by tomorrow.

## 2012-10-03 NOTE — Progress Notes (Signed)
Medical Student Daily Progress Note   Subjective:    Interval Events:  No acute events overnight.  Patient c/o vague abdominal pain in the suprapubic region, similar to pain he had last week.      Objective:    Vital Signs:   Temp:  [97.2 F (36.2 C)-98.7 F (37.1 C)] 97.2 F (36.2 C) (10/16 0434) Pulse Rate:  [65-92] 79  (10/16 0434) Resp:  [18-19] 18  (10/16 0434) BP: (118-149)/(55-80) 140/74 mmHg (10/16 0434) SpO2:  [91 %-100 %] 95 % (10/16 0525) Weight:  [67.5 kg (148 lb 13 oz)-67.7 kg (149 lb 4 oz)] 67.5 kg (148 lb 13 oz) (10/16 0434) Last BM Date: 10/02/12   Weights: 24-hour Weight change: -0.657 kg (-1 lb 7.2 oz)  Filed Weights   10/02/12 0500 10/02/12 1551 10/03/12 0434  Weight: 68.357 kg (150 lb 11.2 oz) 67.7 kg (149 lb 4 oz) 67.5 kg (148 lb 13 oz)     Intake/Output:   Intake/Output Summary (Last 24 hours) at 10/03/12 0818 Last data filed at 10/03/12 0600  Gross per 24 hour  Intake    240 ml  Output    825 ml  Net   -585 ml       Physical Exam: Vitals: T 97.2 P 79 R 18 BP 140/74 SpO2 98% 2L [place]  General: Lying in bed in NAD.  Skin: Bruising on dorsum of hands. Darkening of skin on R LE.  Head: Normocephalic, atraumatic. Right-sided mouth droop, improved from presentation.  Eyes: PERRLA. EOMI. No scleral icterus.  Ears: Normal gross hearing bilaterally with hearing aids in place.  CV: Regular rate, irregular rhythm. No m/r/g. No carotid bruits.  Resp: Normal work of breathing. Diffuse, mild expiratory wheezes.  Abd: Soft, non-tender, non-distended.  Ext: Left BKA stump is c/d/non-erythematous and non-tender.  Skin: No bedsores noted.  Neuro: Right-sided mouth/lower facial weakness improved from last week. CN II-XII otherwise intact bilaterally. RUE 4/5 strength, R wrist 3/5 strength, R hand 0/5 strength. Patient can now move wrist and but this morning still cannot move fingers. LUE 5/5 strength. RLE and LLE 5/5 strength.     Labs: Basic Metabolic  Panel:  Lab 10/03/12 0610 10/02/12 0124 10/01/12 0754 09/30/12 0934 09/29/12 2210  NA 133* 136 135 135 130*  K 4.5 4.2 4.7 5.1 5.2*  CL 96 101 102 105 99  CO2 28 28 26 20 20   GLUCOSE 367* 120* 205* 289* 518*  BUN 29* 25* 23 29* 32*  CREATININE 1.18 1.09 1.14 1.19 1.32  CALCIUM 8.5 8.5 8.7 -- --  MG -- -- -- -- --  PHOS -- -- -- -- --    Liver Function Tests:  Lab 09/29/12 0900 09/28/12 1605  AST 54* 82*  ALT 19 22  ALKPHOS 104 115  BILITOT 0.7 0.5  PROT 6.5 6.9  ALBUMIN 2.6* 2.9*    CBC:  Lab 10/03/12 0610 10/02/12 0124 10/01/12 0754 09/30/12 0934 09/29/12 0900  WBC 11.5* 10.5 13.5* 11.7* 12.6*  NEUTROABS -- -- -- -- 11.2*  HGB 11.3* 11.0* 11.8* 11.1* 11.0*  HCT 35.3* 34.4* 36.5* 35.3* 34.8*  MCV 88.7 88.2 88.4 89.8 90.4  PLT 355 319 338 292 321    Cardiac Enzymes:  Lab 10/02/12 1732 10/02/12 0812 10/02/12 0124 10/01/12 2136 10/01/12 1352 10/01/12 0755 10/01/12 0530  CKTOTAL 45 48 44 48 54 -- --  CKMB 3.2 3.5 3.0 3.1 4.0 -- --  CKMBINDEX -- -- -- -- -- -- --  TROPONINI -- --  4.55* 4.34* 4.75* 6.71* 2.44*    BNP (last 3 results):  Basename 09/30/12 1233  PROBNP 29199.0*    CBG:  Lab 10/03/12 0631 2012/10/30 2023 2012/10/30 1618 10/30/12 1210 2012/10/30 0821  GLUCAP 372* 220* 204* 213* 217*    Coagulation Studies:  Basename 10/03/12 0610 30-Oct-2012 0124 10/01/12 0530  LABPROT 20.3* 19.6* 16.7*  INR 1.81* 1.72* 1.39    Microbiology: Results for orders placed during the hospital encounter of 09/25/12  CULTURE, BLOOD (ROUTINE X 2)     Status: Normal (Preliminary result)   Collection Time   09/27/12 11:45 AM      Component Value Range Status Comment   Specimen Description BLOOD RIGHT ARM   Final    Special Requests BOTTLES DRAWN AEROBIC AND ANAEROBIC 10CC   Final    Culture  Setup Time 09/27/2012 19:56   Final    Culture     Final    Value:        BLOOD CULTURE RECEIVED NO GROWTH TO DATE CULTURE WILL BE HELD FOR 5 DAYS BEFORE ISSUING A FINAL NEGATIVE  REPORT   Report Status PENDING   Incomplete   CULTURE, BLOOD (ROUTINE X 2)     Status: Normal   Collection Time   09/27/12 11:50 AM      Component Value Range Status Comment   Specimen Description BLOOD LEFT HAND   Final    Special Requests BOTTLES DRAWN AEROBIC AND ANAEROBIC 10CC   Final    Culture  Setup Time 09/27/2012 19:56   Final    Culture     Final    Value: STAPHYLOCOCCUS SPECIES (COAGULASE NEGATIVE)     Note: THE SIGNIFICANCE OF ISOLATING THIS ORGANISM FROM A SINGLE SET OF BLOOD CULTURES WHEN MULTIPLE SETS ARE DRAWN IS UNCERTAIN. PLEASE NOTIFY THE MICROBIOLOGY DEPARTMENT WITHIN ONE WEEK IF SPECIATION AND SENSITIVITIES ARE REQUIRED.     Note: Gram Stain Report Called to,Read Back By and Verified With: MIKE GALLIGAN @1345  09/28/12 BY KRAWS   Report Status 09/29/2012 FINAL   Final   URINE CULTURE     Status: Normal   Collection Time   09/28/12 10:40 AM      Component Value Range Status Comment   Specimen Description URINE, RANDOM   Final    Special Requests NONE   Final    Culture  Setup Time 09/28/2012 11:24   Final    Colony Count NO GROWTH   Final    Culture NO GROWTH   Final    Report Status 09/29/2012 FINAL   Final   MRSA PCR SCREENING     Status: Normal   Collection Time   09/30/12  1:13 PM      Component Value Range Status Comment   MRSA by PCR NEGATIVE  NEGATIVE Final     Other results: EKG Results 10/12:  Rate: 102  PR: --  QRS: 144 ms  QTc: 521  EKG: A fib with rvr; RBBB  Imaging: Ct Thoracic Spine Wo Contrast  October 30, 2012  *RADIOLOGY REPORT*  Clinical Data: T10 compression fracture.  Rule out retropulsion.  CT THORACIC SPINE WITHOUT CONTRAST  Technique:  Multidetector CT imaging of the thoracic spine was performed without intravenous contrast administration. Multiplanar CT image reconstructions were also generated  Comparison: Lumbar radiographs 09/25/2012.  Findings: Mild compression fracture of T10.  No prior studies are available prior to  09/30/2012.   This is of indeterminate age but most likely a mild chronic fracture.  No retropulsion into the  canal.  Mild compression fracture of T7.  This could be a recent fracture. No retropulsion into the spinal canal.  Cement vertebral augmentation of T11 from the right pedicle approach.  Cement is in good position.  Cement vertebral augmentation of T12 from the left pedicle approach.  There is mild epidural cement on the left.  This is not causing significant spinal stenosis.  Cement vertebral augmentation of L1-4 right pedicle approach with satisfactory cement positioning.  No evidence of metastatic disease to the spine.  There are mild to moderately large pleural effusions bilaterally.  Extensive coronary artery calcification and atherosclerotic calcification is noted.  IMPRESSION: Mild compression fractures of T7 and T10 of  indeterminate age. MRI may be helpful to evaluate for bone marrow edema.  Chronic compression fracture with cement vertebral augmentation T11, T12 and L1.   Original Report Authenticated By: Camelia Phenes, M.D.       Medications:    Infusions:     Scheduled Medications:    . amLODipine  10 mg Oral Daily  . aspirin EC  81 mg Oral Daily  . atorvastatin  80 mg Oral q1800  . insulin aspart  0-20 Units Subcutaneous TID WC  . insulin glargine  18 Units Subcutaneous QHS  . metoprolol tartrate  75 mg Oral BID  . pantoprazole  40 mg Oral QHS  . polyethylene glycol  17 g Oral Daily  . sodium chloride  500 mL Intravenous Once  . DISCONTD: amLODipine  5 mg Oral Daily  . DISCONTD: furosemide  40 mg Oral BID  . DISCONTD: lidocaine  1 patch Transdermal Q24H  . DISCONTD: warfarin  2.5 mg Oral ONCE-1800  . DISCONTD: Warfarin - Pharmacist Dosing Inpatient   Does not apply q1800     PRN Medications: acetaminophen, albuterol, bisacodyl, dextrose, guaiFENesin, ondansetron (ZOFRAN) IV, zolpidem, DISCONTD: albuterol   Assessment/ Plan:    Mr. Oblinger is an 78 yo gentleman with multiple  comorbidities, including HTN, DM, CHF, CAD, a fib, PVD, and L BKA with right-sided weakness and difficulty speaking secondary to an acute ischemic stroke, and he is now recovering from acute CHF exacerbation.   #CHF exacerbation  Stable. Patient's SOB over the weekend most likely due to CHF exacerbation due to fluid overload. SpO2 98% on 2L [place] this am but patient was saturating well on RA yesterday. Patient denies SOB and chest pain. On exam there are mild expiratory wheezes in upper lung fields. Most recent troponins 4.75->4.34->4.55. Patient has a h/o CHF exacerbation secondary to fluid overload in previous hospitalization. 2D echo shows EF 65-70%, but patient could have diastolic dysfunction.  Cards consulted, appreciate recs.  --Creatinine today 1.18. Change Lasix 40 mg po BID to turosemide 10 mg BID since this is his home regimen  --albuterol nebulizer BID prn SOB/wheezing   #Hypertension  BPs have been 120-170s over 60-90s. In setting of CHF exacerbation, BP and HR will be aggressively controlled, per cardiology recommendation. Home regimen: amlodipine 5 mg po BID, losartan 50 mg po BID, metoprolol 75 mg po BID.  --metoprolol 75 mg BID  --amlodipine 10 mg daily   #Ischemic stroke  Stable. Patient is still experiencing right-sided UE weakness (improved from presentation) and dysarthria (improved from presentation) secondary to an ischemic stroke in the left MCA territory that was confirmed by brain MRI.  CTA of head and neck reveal high-grade stenosis in the cavernous left internal carotid artery. Patient initially presented with RUE paralysis and complete expressive aphasia and/or dysarthria. Patient is at  high risk for an embolic or thrombotic stroke because he has chronic a fib and stopped his warfarin x 4 days, and he has CAD, hypertension, and DM. CHADS2 score is 6. 2D echo showed EF 65%, mild LVH, and calcified MV annulus. Neuro consulted, appreciate recs.  --Per speech, dysphagia 3 diet    --ASA 81 mg po once daily  --Holding pradaxa. Per neuro and pharmacy, pradaxa 75 mg po BID for secondary stroke prevention. However, daughter (who is healthcare power of attorney) has decided that for now she prefers Warfarin for anticoagulation. She has discussed the risks and benefits with the primary team, neurologist, pharmacist, and her daughter-in-law (who is a pharmacy resident at [facility]  --Warfarin (started on 10/12) per pharmacy. INR today 1.72. Warfarin was held yesterday for possible IR vertebroplasty this am, but his INR is 1.81 and therefore IR will not proceed with this procedure. Decided to restart Warfarin today and re-address vertebroplasty at a later date. --PM&R recommends inpatient rehabilitation. He will be transferred to their service later today. --2D Echocardiogram shows EF 65-70%, mild LVH, MV with calcified annulus, and a bioprosthetic aortic valve  --Risk factor modification  --Telemetry monitoring  --atorvastatin 80 mg po once daily.  --HgbA1c 8%  --fasting lipid panel: chol 132 TG 135 HDL 31 LDL 74 VLDL 27 CHOL/HDL ratio 4.3   #Contrast-induced AKI  Resolved. Creatinine today 1.19. Most likely contrast-induced AKI. FeNa 0.16%, suggesting pre-renal etiology. Per daughter the pt's baseline creatinine prior to amputation was 1.7-1.9. We considered obstruction as a cause, though bladder scan showed 30 cc's post-void residual and renal US was negative for obstruction (though left kidney was not visualized due to bowel). U/A: spec grav 1.020 pH 5 glucose 100, large bili, ketone 15, protein 100, negative nitrite/LE/Hgb; hyaline and granular casts.  --I/O cath if postvoid bladder scan > 300 cc's or if patient unable to void  --trend creatinine, especially in setting of diuresis   #Leukocytosis  Resolved. WBC 10.5-->11.5. Was most likely due to physiologic stress. Spoke with ID attending regarding antibiotic therapy when his white count increased--decision was reached to  d/c antibiotics, as patient has no signs/symptoms concerning with infection and has no identifiable source at this time (CXR more concerning for acute CHF, particularly given clinical context). Clinically the patient did not show signs of infection: he was normo-hypertensive, afebrile, his BKA stump was clean and dry, his peripheral IV was non-erythematous, and no bed sores were noted. 1/2 blood cultures grew coagulase negative staph, most likely representative of specimen contamination. Patient received abx 10/10-10/14.  --d/c'd vanc, zosyn on 10/13   #Back pain  Due to T10 compression fracture and possibly a muscular component.  Does not complain of pain this am. On exam patient is tender to palpation on the left paravertebral region at lower thoracic/upper lumbar spine. Patient had two falls recently, the most recent on 10/3, when he slipped on a sock in the bathroom. He denies LOC or head trauma. Xray lumar spine shows compression fractures at L3 and L4 of indeterminate age whose location does not correlate with his pain. Xray thoracic spine shows T10 compression fracture that is unchanged from before. We will continue conservative management at this time.  --heating pad  --lidocaine patch for pain  --guaifenesin cough syrup since cough exacerbates back pain  --avoid narcotics, as has h/o AMS with these  --consulted IR for cement fixation of his stress fractures for pain relief. Per their recs, we held warfarin yesterday but INR today is  1.81.  They  will do the procedure until INR is at or below 1.5.  At this time we feel that re-starting Warfarin and beginning inpatient rehab takes precedence over vertebroplasty. We will restart Warfarin today and transfer pt to inpatient rehab.  Possible vertebroplasty can be re-addressed at a later date. We discussed this plan with his daughter Avon Gully), who agrees with this plan.  #Diabetes mellitus  CBGs 300s this am.  --resistant SSI coverage with aspart   --lantus 18 U at bedtime --per diabetes counselor, consider Novolog (aspart) 4 U for meal coverage   #Chronic A fib  INR today 1.81.  --Holding pradaxa for now. Per neuro,started on pradaxa 150 mg BID on 10/9. He received 2 doses (last one on 10/9 at 2200). If we do re-start pradaxa, pharmacy recommends 75 mg BID due to decreased GFR. For now, daughter (who is healthcare power of attorney) wants him re-started on warfarin instead.  --warfarin per pharmacy (5 mg once daily; INR = 1.72 this am). Held yesterday for possible cement fixation of thoracic compression fx , but INR too high to proceed. Will restart warfarin today per pharmacy. --daughter willing to transition to pradaxa in 1-2 months if patient doing well   #FEN/GI  --dysphagia 3 diet  --IVF d/c'd   #PPX  --heparin 5,000U [place]  --protonix IV 40 mg once daily    This is a Psychologist, occupational Note.  The care of the patient was discussed with Dr. Zada Girt and the assessment and plan formulated with their assistance.  Please see their attached note or addendum for official documentation of the daily encounter.  8:06 PM  I personally evaluated this patient with the medical student and I agree with the plan as detailed above. Specifically the patient will be restarted on warfarin. He will be transferred to inpatient rehab. Vertebroplasty will be differed and could be as outpatient.

## 2012-10-03 NOTE — Progress Notes (Signed)
Inpatient Diabetes Program Recommendations  AACE/ADA: New Consensus Statement on Inpatient Glycemic Control (2013)  Target Ranges:  Prepandial:   less than 140 mg/dL      Peak postprandial:   less than 180 mg/dL (1-2 hours)      Critically ill patients:  140 - 180 mg/dL   Results for Aaron [name], Aaron [name] (MRN [id number]) as of 10/03/2012 13:17  Ref. Range 10/02/2012 08:21 10/02/2012 12:10 10/02/2012 16:18 10/02/2012 20:23 10/03/2012 06:31  Glucose-Capillary Latest Range: 70-99 mg/dL 782 (H) 956 (H) 213 (H) 220 (H) 372 (H)    Inpatient Diabetes Program Recommendations Insulin - Basal: Increase Lantus to 20 units  Insulin - Meal Coverage: Please consider starting Novolog 4 units for meal coverage.  Thank you  Piedad Climes [facility] Inpatient Diabetes Coordinator [phone] (8am-[phone] after 5pm

## 2012-10-03 NOTE — Progress Notes (Signed)
Patient admitted to 4035 via bed. Patient has grandson at bedside. Patient declined to do admission assessment and paperwork till he had eaten dinner. Discussed with grandson, rehab routine, stroke education booklet, team conference, white board , therapy evaluation process.Discussed preferred name and goal of rehab with patient. Roberts-[place]on[place], Aaron [name]

## 2012-10-04 ENCOUNTER — Inpatient Hospital Stay (HOSPITAL_COMMUNITY): Payer: Medicare Other | Admitting: Occupational Therapy

## 2012-10-04 ENCOUNTER — Inpatient Hospital Stay (HOSPITAL_COMMUNITY): Payer: Medicare Other | Admitting: Physical Therapy

## 2012-10-04 ENCOUNTER — Inpatient Hospital Stay (HOSPITAL_COMMUNITY): Payer: Medicare Other | Admitting: Speech Pathology

## 2012-10-04 DIAGNOSIS — G8110 Spastic hemiplegia affecting unspecified side: Secondary | ICD-10-CM

## 2012-10-04 DIAGNOSIS — I69991 Dysphagia following unspecified cerebrovascular disease: Secondary | ICD-10-CM

## 2012-10-04 DIAGNOSIS — I6330 Cerebral infarction due to thrombosis of unspecified cerebral artery: Secondary | ICD-10-CM

## 2012-10-04 DIAGNOSIS — I639 Cerebral infarction, unspecified: Secondary | ICD-10-CM

## 2012-10-04 DIAGNOSIS — Z5189 Encounter for other specified aftercare: Secondary | ICD-10-CM

## 2012-10-04 LAB — COMPREHENSIVE METABOLIC PANEL
AST: 17 U/L (ref 0–37)
Albumin: 2.6 g/dL — ABNORMAL LOW (ref 3.5–5.2)
Alkaline Phosphatase: 113 U/L (ref 39–117)
BUN: 24 mg/dL — ABNORMAL HIGH (ref 6–23)
Chloride: 99 mEq/L (ref 96–112)
Potassium: 3.9 mEq/L (ref 3.5–5.1)
Total Bilirubin: 0.5 mg/dL (ref 0.3–1.2)

## 2012-10-04 LAB — GLUCOSE, CAPILLARY
Glucose-Capillary: 184 mg/dL — ABNORMAL HIGH (ref 70–99)
Glucose-Capillary: 135 mg/dL — ABNORMAL HIGH (ref 70–99)

## 2012-10-04 LAB — CBC WITH DIFFERENTIAL/PLATELET
Eosinophils Absolute: 0.3 10*3/uL (ref 0.0–0.7)
Eosinophils Relative: 3 % (ref 0–5)
HCT: 34.2 % — ABNORMAL LOW (ref 39.0–52.0)
Lymphocytes Relative: 11 % — ABNORMAL LOW (ref 12–46)
Lymphs Abs: 1.2 10*3/uL (ref 0.7–4.0)
MCH: 28.6 pg (ref 26.0–34.0)
MCV: 87.5 fL (ref 78.0–100.0)
Monocytes Absolute: 1.1 10*3/uL — ABNORMAL HIGH (ref 0.1–1.0)
Platelets: 305 10*3/uL (ref 150–400)
RDW: 16.2 % — ABNORMAL HIGH (ref 11.5–15.5)

## 2012-10-04 LAB — PROTIME-INR: Prothrombin Time: 19.9 seconds — ABNORMAL HIGH (ref 11.6–15.2)

## 2012-10-04 MED ORDER — HYDROCERIN EX CREA
TOPICAL_CREAM | Freq: Three times a day (TID) | CUTANEOUS | Status: DC | PRN
  Filled 2012-10-04: qty 113

## 2012-10-04 MED ORDER — WARFARIN SODIUM 5 MG PO TABS
5.0000 mg | ORAL_TABLET | Freq: Once | ORAL | Status: AC
  Administered 2012-10-04: 5 mg via ORAL
  Filled 2012-10-04: qty 1

## 2012-10-04 NOTE — Progress Notes (Signed)
Orthopedic Tech Progress Note Patient Details:  Aaron [name] November 20, 1924 [id number] Order called in to [employer] for Lumbar corset at 9:20am. Order taken by Judeth Cornfield at [employer].  Ortho Devices Type of Ortho Device: Lumbar corsett Ortho Device/Splint Interventions: Ordered   [name] 10/04/2012, 9:19 AM

## 2012-10-04 NOTE — Progress Notes (Signed)
Social Work Assessment and Plan Social Work Assessment and Plan  Patient Details  Name: Aaron [name] MRN: [id number] Date of Birth: 1924/11/08  Today's Date: 10/04/2012  Problem List:  Patient Active Problem List  Diagnosis  . Acute ischemic stroke  . Hypertension  . Diabetes mellitus  . A-fib  . S/P CABG x 1  . Aortic valve replaced  . Back pain  . NSTEMI (non-ST elevated myocardial infarction)  . Acute exacerbation of CHF (congestive heart failure)  . Compression fracture of spine  . CVA (cerebral infarction)   Past Medical History:  Past Medical History  Diagnosis Date  . Hypertension   . Diabetes mellitus   . CHF (congestive heart failure)   . Coronary artery disease   . Atrial fibrillation   . PVD (peripheral vascular disease)   . S/P CABG x 1 2008  . S/P aortic valve replacement with porcine valve 2008   Past Surgical History:  Past Surgical History  Procedure Date  . Below knee leg amputation 07/2012  . Cataract extraction w/ intraocular lens implant   . Abdominal surgery   . Hernia repair     x5  . Back surgery     vertebroplasty  . Coronary artery bypass graft 2008   Social History:  reports that he has quit smoking. He uses smokeless tobacco. He reports that he does not drink alcohol or use illicit drugs.  Family / Support Systems Marital Status: Widow/Widower How Long?: 3 years Patient Roles: Parent;Volunteer Children: Janice-daughter  [phone]-cell  Donna-daughter  [phone]-cell Other Supports: Caregivers-Lynne Anticipated Caregiver: Has hired 24 hr care, has had since discharged from [facility]-three daughter's very involved in his care Ability/Limitations of Caregiver: Hired caregiver's and daughter's Caregiver Availability: 24/7 Family Dynamics: Close knit family tow daughter's are local and are very involved in pt's care.  Third daughter is in [place] and comes frequently to visit pt.  Wife died three years ago from brain cancer.  Social  History Preferred language: English Religion:  Cultural Background: No issues Education: Automotive engineer educated Read: Yes Write: Yes Employment Status: Retired Date Retired/Disabled/Unemployed: [employer] Issues: No issues Guardian/Conservator: None-according to MD pt is capable of making his own decisions.   Abuse/Neglect Physical Abuse: Denies Verbal Abuse: Denies Sexual Abuse: Denies Exploitation of patient/patient's resources: Denies Self-Neglect: Denies  Emotional Status Pt's affect, behavior adn adjustment status: Pt is motivated to improve but realizes his challenges with the stroke now and amputation.  He is willing to try to do all he can for himself.  He utilizes humor to get himself through tough times.  Daughter confirms this. Recent Psychosocial Issues: Other medical issues-recent amputation 07/2012 Pyschiatric History: No history-deferrred depresison screening at this time due to pt felt not necessary.  Will continue to monitor his coping while here. Substance Abuse History: No issues  Patient / Family Perceptions, Expectations & Goals Pt/Family understanding of illness & functional limitations: Both pt and daughter have a good understanding of his condition and deficits.  He is willing to work hard and do what he can to improve.  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 Premorbid pt/family roles/activities: Father, grandfather, retiree, Church member, etc Anticipated changes in roles/activities/participation: resume Pt/family expectations/goals: Pt reprots: " I want to be able to get myself from one place to another."  Daughter states: " We are hopeful and will just wait and see, we will do what we need to do to  help him."  [employer]: Other (Comment) Premorbid Home Care/DME Agencies: Other (Comment) (Had HHPT, RN PTA) Transportation available at discharge:  Family and cargeiver-Lynne Resource referrals recommended: Support group (specify) (Amputee & CVA Support group)  Discharge Planning Living Arrangements: Other (Comment) (Hired 24 hr caregiver stays with him) Support Systems: Children;Other relatives;Friends/neighbors;Church/faith community;Other (Comment) (Hired caregivers) Type of Residence: Private residence Insurance Resources: Administrator (specify) Manufacturing systems engineer) Financial Resources: Tree surgeon;Other (Comment) Immunologist from [employer]) Financial Screen Referred: No Living Expenses: Own Money Management: Family Do you have any problems obtaining your medications?: No Home Management: Hired assist do the cooking and cleaning and errands Patient/Family Preliminary Plans: Return home with caregivers and daughter's assisting.  Pt would like to be able to do some on his own. Social Work Anticipated Follow Up Needs: HH/OP;Support Group  Clinical Impression Very pleasant gentleman with a very committed family.  Already has 24 hour hired assistance at home from previous amputation and discharge from SNF. Daughter's will be involved in his care here and present.  Lucy Chris 10/04/2012, 10:16 AM

## 2012-10-04 NOTE — Evaluation (Signed)
Speech Language Pathology Assessment and Plan  Patient Details  Name: Aaron [name] MRN: [id number] Date of Birth: [birth year]  SLP Diagnosis: Dysarthria;Cognitive Impairments, Dysphagia  Rehab Potential: Good ELOS: 2.5-3 weeks   Today's Date: 10/04/2012 Time: 1300-1400 Time Calculation (min): 60 min  Problem List:  Patient Active Problem List  Diagnosis  . Acute ischemic stroke  . Hypertension  . Diabetes mellitus  . A-fib  . S/P CABG x 1  . Aortic valve replaced  . Back pain  . NSTEMI (non-ST elevated myocardial infarction)  . Acute exacerbation of CHF (congestive heart failure)  . Compression fracture of spine  . CVA (cerebral infarction)   Past Medical History:  Past Medical History  Diagnosis Date  . Hypertension   . Diabetes mellitus   . CHF (congestive heart failure)   . Coronary artery disease   . Atrial fibrillation   . PVD (peripheral vascular disease)   . S/P CABG x 1 2008  . S/P aortic valve replacement with porcine valve 2008   Past Surgical History:  Past Surgical History  Procedure Date  . Below knee leg amputation 07/2012  . Cataract extraction w/ intraocular lens implant   . Abdominal surgery   . Hernia repair     x5  . Back surgery     vertebroplasty  . Coronary artery bypass graft 2008    Assessment / Plan / Recommendation Clinical Impression  Aaron [name] is an 78 y.o. RH-male with history of DM with peripheral neuropathy, L-BKA 08/01/2012 at [facility] and was discharged to [facility] skilled nursing facility before returning home with 24-hour care, A Fib and maintained on chronic Coumadin Admitted on 09/25/12 with inability to move right side and inability to speak. Patient on chronic coumadin that had been held recently after noted INR 3.1 and INR sub-therapeutic at admission of 1.35.  CT head revealing remote right occipital and cerebellar infarcts with global atrophy.  MRI brain done revealing acute infarct  posterior left frontal lobe.  Patient had improvement of symptoms in ED. Was able to start speaking and move RUE.  Patient did not receive TPA. Patient has had gradual worsening of symptoms overnight.  ST evaluation showed mild oral phase dysphagia and patient placed on D3 diet. PT evaluation revealed balance deficits and problems with mobility. Patient was felt to be a good candidate for inpatient rehabilitation services and was admitted for comprehensive rehabilitation program.  Patient admitted to [facility] 10/03/2012.  SLP evaluation revealed decreased recall of new information and decreased intelligibility as a result of diminished respiratory support and imprecise articulation.  Direct cuing and repetitions were effective in facilitating recall.  As a result, patient remembered to use safe swallowing strategies (e.g. chin tuck and multiple swallows) which had been taught during previous sessions.  Patient consumed regular textures and thin liquids with supervision cues to utilize compensatory strategies and exhibited no overt s/s of aspiration during the session.      SLP Assessment  Patient will need skilled Speech Lanaguage Pathology Services during CIR admission    Recommendations  Follow up Recommendations: 24 hour supervision/assistance Equipment Recommended: None recommended by SLP    SLP Frequency 1-2 X/day, 30-60 minutes;5 out of 7 days   SLP Treatment/Interventions Cognitive remediation/compensation;Cueing hierarchy;Functional tasks;Therapeutic Activities;Patient/family education;Multimodal communication approach;Internal/external aids;Speech/Language facilitation;Environmental controls    Pain Pain Assessment Pain Assessment: No/denies pain Prior Functioning Cognitive/Linguistic Baseline: Within functional limits Type of Home: House Lives With: Alone;Other (Comment) (24 hour hired help) Available Help at Discharge:  Available 24 hours/day;Personal care attendant;Family Vocation: Retired  Film/video editor)  Short Term Goals: Week 1: SLP Short Term Goal 1 (Week 1): Patient will identify two strategies to facilitate increased speech intelligibility with mod assist.   SLP Short Term Goal 2 (Week 1): Patient will utilize speech intelligibility strategies during conversation with mod assist visual and verbal cues.   SLP Short Term Goal 3 (Week 1): Patient will identify two strategies to facilitate increased recall of new information with mod assist.   SLP Short Term Goal 4 (Week 1): Patient will use external and internal memory aids to facilitate increased recall of new information with mod assist verbal cues.   SLP Short Term Goal 5 (Week 1): Patient will tolerate trials of thin liquids and regular textures without the use of a chin tuck with no overt s/s of aspiration with min assist for precautions.    See FIM for current functional status Refer to Care Plan for Long Term Goals  Recommendations for other services: None  Discharge Criteria: Patient will be discharged from SLP if patient refuses treatment 3 consecutive times without medical reason, if treatment goals not met, if there is a change in medical status, if patient makes no progress towards goals or if patient is discharged from hospital.  The above assessment, treatment plan, treatment alternatives and goals were discussed and mutually agreed upon: by patient  Ace Gins  Graduate Clinician Speech Language Pathology  Page, Joni Reining 10/04/2012, 3:57 PM  The above skilled treatment note has been reviewed and SLP is in agreement. Fae Pippin, M.A., CCC-SLP [phone]

## 2012-10-04 NOTE — Progress Notes (Addendum)
Patient ID: Aaron [name], male   DOB: [birth year], 78 y.o.   MRN: [id number]  Subjective/Complaints: Back pain, no stump pain or phantom limb pain Review of Systems  HENT: Positive for hearing loss.   Neurological: Positive for speech change and focal weakness.  All other systems reviewed and are negative.    Objective: Vital Signs: Blood pressure 156/92, pulse 83, temperature 98.6 F (37 C), temperature source Oral, resp. rate 19, height 5\' 8"  (1.727 m), weight 68.3 kg (150 lb 9.2 oz), SpO2 97.00%. Dg Chest 2 View  10/03/2012  *RADIOLOGY REPORT*  Clinical Data: Shortness of breath, CHF exacerbation  CHEST - 2 VIEW  Comparison: 09/29/2012  Findings: Enlargement of cardiac silhouette post CABG and AVR. Pulmonary vascular congestion. Mild pulmonary edema, improving. Bibasilar atelectasis and decrease in lung volumes noted. Calcified tortuous aorta. Small bibasilar effusions. No pneumothorax. Bones appear demineralized with compression deformities and vertebroplasties at 3 upper lumbar levels. Epicardial pacing wires noted.  IMPRESSION: Improving CHF. Mild bibasilar atelectasis and small pleural effusions.   Original Report Authenticated By: Lollie Marrow, M.D.    Ct Thoracic Spine Wo Contrast  10/02/2012  *RADIOLOGY REPORT*  Clinical Data: T10 compression fracture.  Rule out retropulsion.  CT THORACIC SPINE WITHOUT CONTRAST  Technique:  Multidetector CT imaging of the thoracic spine was performed without intravenous contrast administration. Multiplanar CT image reconstructions were also generated  Comparison: Lumbar radiographs 09/25/2012.  Findings: Mild compression fracture of T10.  No prior studies are available prior to  09/30/2012.  This is of indeterminate age but most likely a mild chronic fracture.  No retropulsion into the canal.  Mild compression fracture of T7.  This could be a recent fracture. No retropulsion into the spinal canal.  Cement vertebral augmentation of T11 from the right  pedicle approach.  Cement is in good position.  Cement vertebral augmentation of T12 from the left pedicle approach.  There is mild epidural cement on the left.  This is not causing significant spinal stenosis.  Cement vertebral augmentation of L1-4 right pedicle approach with satisfactory cement positioning.  No evidence of metastatic disease to the spine.  There are mild to moderately large pleural effusions bilaterally.  Extensive coronary artery calcification and atherosclerotic calcification is noted.  IMPRESSION: Mild compression fractures of T7 and T10 of  indeterminate age. MRI may be helpful to evaluate for bone marrow edema.  Chronic compression fracture with cement vertebral augmentation T11, T12 and L1.   Original Report Authenticated By: Camelia Phenes, M.D.    Results for orders placed during the hospital encounter of 10/03/12 (from the past 72 hour(s))  GLUCOSE, CAPILLARY     Status: Abnormal   Collection Time   10/03/12  9:02 PM      Component Value Range Comment   Glucose-Capillary 252 (*) 70 - 99 mg/dL   GLUCOSE, CAPILLARY     Status: Abnormal   Collection Time   10/04/12  2:59 AM      Component Value Range Comment   Glucose-Capillary 214 (*) 70 - 99 mg/dL    Comment 1 Notify RN     GLUCOSE, CAPILLARY     Status: Abnormal   Collection Time   10/04/12  7:10 AM      Component Value Range Comment   Glucose-Capillary 135 (*) 70 - 99 mg/dL    Comment 1 Notify RN     CBC WITH DIFFERENTIAL     Status: Abnormal   Collection Time   10/04/12  7:15 AM  Component Value Range Comment   WBC 11.6 (*) 4.0 - 10.5 K/uL    RBC 3.91 (*) 4.22 - 5.81 MIL/uL    Hemoglobin 11.2 (*) 13.0 - 17.0 g/dL    HCT 16.1 (*) 09.6 - 52.0 %    MCV 87.5  78.0 - 100.0 fL    MCH 28.6  26.0 - 34.0 pg    MCHC 32.7  30.0 - 36.0 g/dL    RDW 04.5 (*) 40.9 - 15.5 %    Platelets 305  150 - 400 K/uL    Neutrophils Relative 77  43 - 77 %    Neutro Abs 8.9 (*) 1.7 - 7.7 K/uL    Lymphocytes Relative 11 (*) 12  - 46 %    Lymphs Abs 1.2  0.7 - 4.0 K/uL    Monocytes Relative 9  3 - 12 %    Monocytes Absolute 1.1 (*) 0.1 - 1.0 K/uL    Eosinophils Relative 3  0 - 5 %    Eosinophils Absolute 0.3  0.0 - 0.7 K/uL    Basophils Relative 1  0 - 1 %    Basophils Absolute 0.1  0.0 - 0.1 K/uL   PROTIME-INR     Status: Abnormal   Collection Time   10/04/12  7:15 AM      Component Value Range Comment   Prothrombin Time 19.9 (*) 11.6 - 15.2 seconds    INR 1.76 (*) 0.00 - 1.49      HEENT: poor dentition Cardio: irregular Resp: Wheezes GI: BS positive and non tender no masses Extremity:  No Edema Skin:   Intact and Wound Well healed L BKA Neuro: Alert/Oriented, Cranial Nerve Abnormalities mild R central 7, Abnormal Sensory mild reduction to lt touch in R foot, Abnormal Motor o/5 R wrist extensors, 0/5 finger extensors adn flexor, 3- at R bi , tri, delt, 4/5 R HF KE 2-/5 R ankle DF and PF, Abnormal FMC Ataxic/ dec FMC and Dysarthric Musc/Skel:  Other L BKA without edema or tenderness, R foot with red scaly skin and intrinsic muscle atrophy and reduced pulses but foot is warm no apraxia evident   Assessment/Plan: 1. Functional deficits secondary to L posterior frontal infarct with R HP and recent L BKA which require 3+ hours per day of interdisciplinary therapy in a comprehensive inpatient rehab setting. Physiatrist is providing close team supervision and 24 hour management of active medical problems listed below. Physiatrist and rehab team continue to assess barriers to discharge/monitor patient progress toward functional and medical goals. FIM:                   Comprehension Comprehension Mode: Auditory Comprehension: 5-Follows basic conversation/direction: With extra time/assistive device (bil hearing aids)  Expression Expression Mode: Verbal Expression: 5-Expresses basic needs/ideas: With extra time/assistive device  Social Interaction Social Interaction: 3-Interacts appropriately 50 -  74% of the time - May be physically or verbally inappropriate.  Problem Solving Problem Solving: 2-Solves basic 25 - 49% of the time - needs direction more than half the time to initiate, plan or complete simple activities  Memory Memory: 2-Recognizes or recalls 25 - 49% of the time/requires cueing 51 - 75% of the time  Medical Problem List and Plan:  1. Left frontal lobe embolic infarct/recent left below-knee amputation 07/22/2012  2. DVT Prophylaxis/Anticoagulation: Chronic Coumadin therapy. Monitor for any bleeding episodes  3. Pain Management: Tylenol as needed. Monitor with increased mobility  4. Neuropsych: This patient is capable of making decisions  on his/her own behalf.  5. Hypertension/atrial fibrillation. Cardiac rate controlled. Continue Norvasc 10 mg daily, Lopressor 75 mg twice a day and Demadex 10 mg twice a day. Monitor with increased activity  6. Diabetes mellitus. Hemoglobin A1c of 8.0. Continue Lantus insulin 18 units daily. Check blood sugars a.c. and at bedtime  7. Mild compression fractures T7 and T10 as well his chronic compression fractures with cemented vertebral augmentation T11, T12 and L1. No plans for a further vertebral plasty at this time and followup as outpatient, would need to be off warfarin which would increase CVA risk -advise the use of a soft lumbar-thoracic corset  -analgesic 8. Left BKA- well shaped. Continue stump shrinker.  -prosthesis at some point after dc once improved from a stroke and back stand point.    LOS (Days) 1 A FACE TO FACE EVALUATION WAS PERFORMED  KIRSTEINS,ANDREW E 10/04/2012, 7:59 AM

## 2012-10-04 NOTE — Evaluation (Signed)
I have read and agree with the following evaluation and treatment session.  Parissa Chiao Hall, PT, DPT 

## 2012-10-04 NOTE — Care Management Note (Signed)
Inpatient Rehabilitation Center Individual Statement of Services  Patient Name:  [name]  Date:  10/04/2012  Welcome to the Inpatient Rehabilitation Center.  Our goal is to provide you with an individualized program based on your diagnosis and situation, designed to meet your specific needs.  With this comprehensive rehabilitation program, you will be expected to participate in at least 3 hours of rehabilitation therapies Monday-Friday, with modified therapy programming on the weekends.  Your rehabilitation program will include the following services:  Physical Therapy (PT), Occupational Therapy (OT), Speech Therapy (ST), 24 hour per day rehabilitation nursing, Neuropsychology, Case Management (RN and Social Worker), Rehabilitation Medicine, Nutrition Services and Pharmacy Services  Weekly team conferences will be held on Wednesday to discuss your progress.  Your RN Case Designer, television/film set will talk with you frequently to get your input and to update you on team discussions.  Team conferences with you and your family in attendance may also be held.  Expected length of stay: 2-3 weeks  Overall anticipated outcome: Over All Min Assist  Depending on your progress and recovery, your program may change.  Your RN Case Estate agent will coordinate services and will keep you informed of any changes.  Your RN Sports coach and SW names and contact numbers are listed  below.  The following services may also be recommended but are not provided by the Inpatient Rehabilitation Center:   Driving Evaluations  Home Health Rehabiltiation Services  Outpatient Rehabilitatation [facility]  Vocational Rehabilitation   Arrangements will be made to provide these services after discharge if needed.  Arrangements include referral to agencies that provide these services.  Your insurance has been verified to be:  Medicare & BCBS Your primary doctor is:  Dr. Bethann Punches  Pertinent  information will be shared with your doctor and your insurance company.    Social Worker:  Dossie Der, [place] [phone]  Information discussed with and copy given to patient by: Lucy Chris, 10/04/2012, 9:01 AM

## 2012-10-04 NOTE — Progress Notes (Signed)
Orthopedic Tech Progress Note Patient Details:  Aaron [name] 08-Jan-1924 [id number] I placed order with Jose Persia 10/04/2012, 6:33 AM

## 2012-10-04 NOTE — Progress Notes (Signed)
Occupational Therapy Session Note  Patient Details  Name: Aaron [name] MRN: [id number] Date of Birth: September 29, 1924  Today's Date: 10/04/2012 Time: 1030-1130 and 1400-1430 Time Calculation (min): 60 min and 30 min  Short Term Goals: Week 1:  OT Short Term Goal 1 (Week 1): Pt will complete LB bathing with min assist at sit to stand level OT Short Term Goal 2 (Week 1): Pt will complete LB dressing with mod assist OT Short Term Goal 3 (Week 1): Pt will complete toilet transfers with mod assist OT Short Term Goal 4 (Week 1): Pt will complete toileting (clothing management and hygiene) with mod assist  Skilled Therapeutic Interventions/Progress Updates:  1) OT eval initiated, followed by ADL retraining at sink level.  Pt demonstrated impaired initiation, sequencing, and decreased motor planning with self-care task of bathing and dressing.  Max assist sit <> stand with focus on RLE foot placement and use of RUE as gross support with resting it on sink during transitional movements.  Pt demonstrated increased fearfulness of falling and hesitancy in standing, however pt able to maintain static standing for ~1 minute while this therapist washed buttocks and pulled up pants.  Pt requires increased time and setup assist secondary to decreased initiation.  2) 1:1 OT with focus on bed mobility, donning lumbar corset, and sit <> stand.  Pt demonstrated bed mobility with increased initiation, however required min assist supine to sit.  Pt required education on proper placement of lumbar corset and donning it when seated EOB.  Engaged in sit <> stand with max assist secondary to fearfulness and pt with "death grip" on w/c arm rest.  While in standing, engaged in peg task to engage LUE in task and encourage release of arm rest.  Pt required increased support at Rt knee and hip to maintain standing while completing task with Lt hand.  Engaged in sit <> stand x2 with increased focus on freeing up LUE to work towards  assisting in pulling up pants with bathing and toileting.  Therapy Documentation Precautions:  Precautions Precautions: Back;Fall Precaution Comments: Lumbar corset to be worn OOB; LLE BKA  Required Braces or Orthoses: Spinal Brace Spinal Brace: Lumbar corset Restrictions Weight Bearing Restrictions: No General:   Vital Signs: Therapy Vitals Pulse Rate: 79  BP: 135/88 mmHg Patient Position, if appropriate: Sitting Oxygen Therapy SpO2: 99 % O2 Device: Nasal cannula O2 Flow Rate (L/min): 1 L/min Pain: Pain Assessment Pain Assessment: No/denies pain ADL: ADL Upper Body Bathing: Minimal assistance Where Assessed-Upper Body Bathing: Sitting at sink Lower Body Bathing: Moderate assistance Where Assessed-Lower Body Bathing: Sitting at sink;Standing at sink Upper Body Dressing: Minimal assistance Where Assessed-Upper Body Dressing: Sitting at sink Lower Body Dressing: Maximal assistance Where Assessed-Lower Body Dressing: Sitting at sink;Standing at sink Toilet Transfer: Maximal assistance Toilet Transfer Method: Stand pivot Toilet Transfer Equipment: Grab bars  See FIM for current functional status  Therapy/Group: Individual Therapy  [name] 10/04/2012, 1:11 PM

## 2012-10-04 NOTE — Progress Notes (Signed)
Orthopedic Tech Progress Note Patient Details:  Aaron [name] 12/20/1923 [id number] Order for lumbar corset placed earlier today by [place] Donovan. Corset delivered and fitted to patient by [employer] on 10/04/12. Ortho Devices Type of Ortho Device: Lumbar corsett Ortho Device/Splint Interventions: Ordered   [name] 10/04/2012, 1:12 PM

## 2012-10-04 NOTE — Progress Notes (Signed)
Patient information reviewed and entered into eRehab system by Rickita Forstner, RN, CRRN, PPS Coordinator.  Information including medical coding and functional independence measure will be reviewed and updated through discharge.     Per nursing patient was given "Data Collection Information Summary for Patients in Inpatient Rehabilitation Facilities with attached "Privacy Act Statement-Health Care Records" upon admission.  

## 2012-10-04 NOTE — Evaluation (Signed)
Occupational Therapy Assessment and Plan  Patient Details  Name: Aaron [name] MRN: [id number] Date of Birth: [birth year]  OT Diagnosis: abnormal posture, acute pain, hemiplegia affecting dominant side, lumbago (low back pain), muscle weakness (generalized) and pain in thoracic spine Rehab Potential: Rehab Potential: Good ELOS: 2.5 - 3 weeks   Today's Date: 10/04/2012 Time: 1030-1130 Time Calculation (min): 60 min  Problem List:  Patient Active Problem List  Diagnosis  . Acute ischemic stroke  . Hypertension  . Diabetes mellitus  . A-fib  . S/P CABG x 1  . Aortic valve replaced  . Back pain  . NSTEMI (non-ST elevated myocardial infarction)  . Acute exacerbation of CHF (congestive heart failure)  . Compression fracture of spine  . CVA (cerebral infarction)    Past Medical History:  Past Medical History  Diagnosis Date  . Hypertension   . Diabetes mellitus   . CHF (congestive heart failure)   . Coronary artery disease   . Atrial fibrillation   . PVD (peripheral vascular disease)   . S/P CABG x 1 2008  . S/P aortic valve replacement with porcine valve 2008   Past Surgical History:  Past Surgical History  Procedure Date  . Below knee leg amputation 07/2012  . Cataract extraction w/ intraocular lens implant   . Abdominal surgery   . Hernia repair     x5  . Back surgery     vertebroplasty  . Coronary artery bypass graft 2008    Assessment & Plan Clinical Impression: Patient is a 78 y.o. year old male with recent admission to the hospital on 09/25/12 with inability to move right side and inability to speak. Patient on chronic coumadin that had been held recently after noted INR 3.1 and INR sub-therapeutic at admission of 1.35. CT head revealing remote right occipital and cerebellar infarcts with global atrophy. CTA head/neck with high grade stenosis of R-VA, distal VA essentially occluded, 50% stenosis prox L-ICA, moderate supraclinoid ICA stenosis bilaterally. MRI brain  done revealing acute infarct posterior left frontal lobe. Patient had improvement of symptoms in ED. Was able to start speaking and move RUE. 2 D echo done revealing mild LVH, EF 65-70%, bioprosthetic AV present. Neurology was consulted there was a first recommendations to begin Pradaxa but the family still preferred Coumadin thus Coumadin was resumed as well as the addition of low-dose aspirin. Patient did not receive TPA. Patient has had gradual worsening of symptoms overnight. ST evaluation showed mild oral phase dysphagia and patient placed on D3 diet. Noted patient with complaints of low back pain her x-rays and imaging revealed mild compression fractures of T7 and T10 of indeterminate age as well as compression chronic fractures T11, T12 and L1 . There was initially planned for possible vertebroplasty which had been held due to INR being more than 1.5. It was later advised to hold on vertebroplasty at this time and to a rehabilitation completed for CVA and discuss outpatient vertebroplasty. Patient transferred to CIR on 10/03/2012 .    Patient currently requires max with basic self-care skills secondary to muscle weakness, decreased oxygen support, impaired timing and sequencing, abnormal tone, unbalanced muscle activation, motor apraxia, decreased coordination and decreased motor planning, decreased motor planning and decreased sitting balance, decreased standing balance, decreased postural control and decreased balance strategies.  Prior to hospitalization, patient could complete BADLs with min assist.  Patient will benefit from skilled intervention to decrease level of assist with basic self-care skills and increase independence with basic self-care skills prior  to discharge home with 24/7 hired help.  Anticipate patient will require 24 hour supervision and minimal physical assistance and follow up home health.  OT - End of Session Activity Tolerance: Tolerates 30+ min activity with multiple  rests OT Assessment Rehab Potential: Good Barriers to Discharge: Decreased caregiver support OT Plan OT Frequency: 1-2 X/day, 60-90 minutes Estimated Length of Stay: 2.5 - 3 weeks OT Treatment/Interventions: Balance/vestibular training;Discharge planning;DME/adaptive equipment instruction;Functional mobility training;Neuromuscular re-education;Patient/family education;Psychosocial support;Self Care/advanced ADL retraining;Therapeutic Activities;Therapeutic Exercise;UE/LE Strength taining/ROM;UE/LE Coordination activities OT Recommendation Follow Up Recommendations: Home health OT Equipment Recommended: None recommended by OT (Pt has necessary DME)  OT Evaluation Precautions/Restrictions  Precautions Precautions: Back;Fall Precaution Comments: Lumbar corset to be worn OOB; LLE BKA  Required Braces or Orthoses: Spinal Brace Spinal Brace: Lumbar corset Restrictions Weight Bearing Restrictions: No General   Vital Signs Therapy Vitals Pulse Rate: 79  BP: 135/88 mmHg Patient Position, if appropriate: Sitting Oxygen Therapy SpO2: 99 % O2 Device: Nasal cannula O2 Flow Rate (L/min): 1 L/min Pain Pain Assessment Pain Assessment: No/denies pain Home Living/Prior Functioning Home Living Lives With: Alone (24 hour hired help) Available Help at Discharge: Available 24 hours/day;Personal care attendant Type of Home: House Home Access: Ramped entrance Home Layout: One level Bathroom Shower/Tub: Engineer, manufacturing systems: Standard Home Adaptive Equipment: Bedside commode/3-in-1;Grab bars around toilet;Grab bars in shower;Hand-held shower hose;Tub transfer bench;Walker - rolling;Wheelchair - manual Additional Comments: Pt had L BKA in August 2013 per daugther report and has been in short term SNF until about 10 days ago IADL History Homemaking Responsibilities: No Prior Function Level of Independence: Needs assistance with ADLs;Needs assistance with gait;Needs assistance with  tranfers;Needs assistance with homemaking Bath: Minimal Dressing: Minimal Able to Take Stairs?: No Driving: No Vocation: Retired Film/video editor) ADL ADL Upper Body Bathing: Minimal assistance Where Assessed-Upper Body Bathing: Sitting at sink Lower Body Bathing: Moderate assistance Where Assessed-Lower Body Bathing: Sitting at sink;Standing at sink Upper Body Dressing: Minimal assistance Where Assessed-Upper Body Dressing: Sitting at sink Lower Body Dressing: Maximal assistance Where Assessed-Lower Body Dressing: Sitting at sink;Standing at sink Toilet Transfer: Maximal assistance Toilet Transfer Method: Stand pivot Toilet Transfer Equipment: Grab bars Vision/Perception  Vision - History Baseline Vision: Wears glasses all the time Patient Visual Report: No change from baseline Vision - Assessment Eye Alignment: Within Functional Limits  Cognition Overall Cognitive Status: Appears within functional limits for tasks assessed Arousal/Alertness: Awake/alert Sensation Sensation Light Touch: Appears Intact Proprioception: Impaired by gross assessment Coordination Gross Motor Movements are Fluid and Coordinated: No Fine Motor Movements are Fluid and Coordinated: No Finger Nose Finger Test: requires increased time, no active finger flexion/extension on eval Motor  Motor Motor: Hemiplegia;Abnormal postural alignment and control Mobility  Bed Mobility Bed Mobility: Rolling Left;Left Sidelying to Sit;Sitting - Scoot to Edge of Bed Rolling Left: 4: Min guard Rolling Left: Patient Percentage: 90% Rolling Left Details: Verbal cues for precautions/safety Left Sidelying to Sit: 4: Min assist (For trunk) Left Sidelying to Sit Details: Tactile cues for posture;Tactile cues for placement;Verbal cues for sequencing;Verbal cues for technique;Verbal cues for precautions/safety Sitting - Scoot to Edge of Bed: 4: Min assist  Trunk/Postural Assessment  Cervical Assessment Cervical Assessment:  Exceptions to [facility] (Forward head posture) Thoracic Assessment Thoracic Assessment: Exceptions to [facility] (Kyphotic sitting and standing posture) Postural Control Postural Control: Deficits on evaluation Trunk Control: Patient presents with significant posterior and to the R lean in sitting and standing. Possible pushing from LUE.   Balance   Extremity/Trunk Assessment RUE Assessment RUE Assessment:  Exceptions to [facility] RUE AROM (degrees) Overall AROM Right Upper Extremity: Deficits (Shoulder & elbow ROM WFL, wrist limited, & no active fingers) RUE Strength RUE Overall Strength: Deficits (grossly 4/5, fluctuates) LUE Assessment LUE Assessment: Within Functional Limits  See FIM for current functional status Refer to Care Plan for Long Term Goals  Recommendations for other services: None  Discharge Criteria: Patient will be discharged from OT if patient refuses treatment 3 consecutive times without medical reason, if treatment goals not met, if there is a change in medical status, if patient makes no progress towards goals or if patient is discharged from hospital.  The above assessment, treatment plan, treatment alternatives and goals were discussed and mutually agreed upon: by patient  [name] 10/04/2012, 12:24 PM

## 2012-10-04 NOTE — Evaluation (Signed)
Physical Therapy Assessment and Plan  Patient Details  Name: Aaron [name] MRN: [id number] Date of Birth: 06-Mar-1924  PT Diagnosis: Abnormal posture, Difficulty walking, Hemiplegia dominant, Low back pain and Muscle weakness Rehab Potential: Good ELOS: 14-21 days   Today's Date: 10/04/2012 Time: 0900-1000 Time Calculation (min): 60 min  Problem List:  Patient Active Problem List  Diagnosis  . Acute ischemic stroke  . Hypertension  . Diabetes mellitus  . A-fib  . S/P CABG x 1  . Aortic valve replaced  . Back pain  . NSTEMI (non-ST elevated myocardial infarction)  . Acute exacerbation of CHF (congestive heart failure)  . Compression fracture of spine  . CVA (cerebral infarction)    Past Medical History:  Past Medical History  Diagnosis Date  . Hypertension   . Diabetes mellitus   . CHF (congestive heart failure)   . Coronary artery disease   . Atrial fibrillation   . PVD (peripheral vascular disease)   . S/P CABG x 1 2008  . S/P aortic valve replacement with porcine valve 2008   Past Surgical History:  Past Surgical History  Procedure Date  . Below knee leg amputation 07/2012  . Cataract extraction w/ intraocular lens implant   . Abdominal surgery   . Hernia repair     x5  . Back surgery     vertebroplasty  . Coronary artery bypass graft 2008    Assessment & Plan Clinical Impression: Patient is a 78 y.o. RH-male with history of DM with peripheral neuropathy, L-BKA 08/01/2012 at [facility] and was discharged to [facility] skilled nursing facility before returning home with 24-hour care. Patient has A Fib and maintained on chronic Coumadin. Admitted on 09/25/12 with inability to move R side and inability to speak. Patient on chronic coumadin that had been held recently after noted INR 3.1 and INR sub-therapeutic at admission of 1.35. CT head revealing remote right occipital and cerebellar infarcts with global atrophy. CTA head/neck with  high grade stenosis of R-VA, distal VA essentially occluded, 50% stenosis prox L-ICA, moderate supraclinoid ICA stenosis bilaterally. MRI brain done revealing acute infarct posterior left frontal lobe. Patient had improvement of symptoms in ED. Was able to start speaking and move RUE. 2 D echo done revealing mild LVH, EF 65-70%, bioprosthetic AV present. Noted patient with complaints of low back pain her x-rays and imaging revealed mild compression fractures of T7 and T10 of indeterminate age as well as compression chronic fractures T11, T12 and L1 . There was initially planned for possible vertebroplasty which had been held due to INR being more than 1.5. It was later advised to hold on vertebroplasty at this time and to a rehabilitation completed for CVA and discuss outpatient vertebroplasty. Patient transferred to CIR on 10/03/2012 .   Patient currently requires min assist with bed mobility to max assist for transfers and standing with mobility secondary to muscle weakness and muscle paralysis, decreased cardiorespiratoy endurance and decreased oxygen support and decreased sitting balance, decreased standing balance, decreased postural control, hemiplegia and decreased balance strategies.  Prior to hospitalization, patient required assistance with mobility and lived with Alone in a House with 24/7 assistance.  Home access is  Ramped entrance.  Patient will benefit from skilled PT intervention to maximize safe functional mobility, minimize fall risk and decrease caregiver burden for planned discharge home with 24 hour assist.  Anticipate patient will benefit from follow up [facility] at discharge.  PT Assessment Rehab Potential: Good Barriers to Discharge: Other (  comment) (Co-morbities) PT Plan PT Frequency: 5 out of 7 days Estimated Length of Stay: 14-21 days PT Treatment/Interventions: Ambulation/gait training;Balance/vestibular training;Discharge planning;DME/adaptive equipment instruction;Functional  mobility training;Neuromuscular re-education;Patient/family education;Therapeutic Activities;Therapeutic Exercise;UE/LE Strength taining/ROM;Wheelchair propulsion/positioning PT Recommendation Follow Up Recommendations: Home health PT Equipment Recommended: None recommended by PT (Patient has necessary DME)  PT Evaluation Precautions/Restrictions Precautions Precautions: Fall;Back Precaution Comments: Lumbar corset to be worn OOB; LLE BKA Required Braces or Orthoses: Spinal Brace Spinal Brace: Lumbar corset Restrictions Weight Bearing Restrictions: No General @[id number] ([id number]) Vital Signs Therapy Vitals Pulse Rate: 79  BP: 135/88 mmHg Patient Position, if appropriate: Sitting Oxygen Therapy SpO2: 99 % O2 Device: Nasal cannula O2 Flow Rate (L/min): 1 L/min Pain Pain Assessment Pain Assessment: No/denies pain Home Living/Prior Functioning Home Living Lives With: Alone Available Help at Discharge: Available 24 hours/day;Personal care attendant Type of Home: House Home Access: Ramped entrance Home Layout: One level Home Adaptive Equipment: Grab bars around toilet;Walker - rolling;Wheelchair - manual Prior Function Level of Independence: Needs assistance with ADLs;Needs assistance with gait;Needs assistance with tranfers;Needs assistance with homemaking Able to Take [name]?: No Driving: No Vocation: Retired Film/video editor)    Cognition Overall Cognitive Status: Appears within functional limits for tasks assessed Arousal/Alertness: Awake/alert Sensation Sensation Light Touch: Appears Intact Coordination Gross Motor Movements are Fluid and Coordinated: Yes Motor  Motor Motor: Hemiplegia;Abnormal postural alignment and control  Mobility Bed Mobility Bed Mobility: Rolling Left;Left Sidelying to Sit;Sitting - Scoot to Edge of Bed Rolling Left: 4: Min guard Rolling Left: Patient Percentage: 90% Rolling Left Details: Verbal cues for precautions/safety Left Sidelying  to Sit: 4: Min assist (For trunk) Left Sidelying to Sit Details: Tactile cues for posture;Tactile cues for placement;Verbal cues for sequencing;Verbal cues for technique;Verbal cues for precautions/safety Sitting - Scoot to Edge of Bed: 4: Min assist Patient performed bed > w/c squat pivot transfer with UE support and 2 helpers for safety (patient performed 60%).   Locomotion  Ambulation Ambulation: No (Unable to assess secondary to being unsafe in standing. )   Trunk/Postural Assessment  Cervical Assessment Cervical Assessment: Exceptions to [facility] (Forward head posture) Thoracic Assessment Thoracic Assessment: Exceptions to [facility] (Kyphotic sitting and standing posture) Postural Control Postural Control: Deficits on evaluation Trunk Control: Patient presents with significant posterior and to the R lean in sitting and standing. Possible pushing from LUE.    Balance Patient required min assist to maintain balance in static sitting. Patient required mod assist to maintain balance when completing dynamic sitting tasks. Patient has poor sitting posture secondary to posterior pelvic tilt and kyphotic thoracic posture, which contributes to his difficulty maintaining balance.  Patient required +2 assist to maintain standing at RW secondary to posterior and R leaning/pushing with LUE.     Extremity Assessment      RLE Assessment RLE Assessment: Exceptions to [facility] RLE Strength Right Hip Flexion: 4/5 Right Hip ABduction: 4/5 Right Hip ADduction: 4/5 Right Knee Flexion: 4/5 Right Knee Extension: 5/5 Right Ankle Dorsiflexion: 4/5 LLE Assessment LLE Assessment: Exceptions to [facility] LLE Strength Left Hip Flexion: 5/5 Left Hip ABduction: 4/5 Left Hip ADduction: 4/5  See FIM for current functional status Refer to Care Plan for Long Term Goals  Recommendations for other services: None  Discharge Criteria: Patient will be discharged from PT if patient refuses treatment 3 consecutive times without  medical reason, if treatment goals not met, if there is a change in medical status, if patient makes no progress towards goals or if patient is discharged from hospital.  The above assessment,  treatment plan, treatment alternatives and goals were discussed and mutually agreed upon: by patient and by family  Leonila Speranza 10/04/2012, 10:33 AM

## 2012-10-04 NOTE — Progress Notes (Signed)
ANTICOAGULATION CONSULT NOTE - Follow Up Consult  Pharmacy Consult for coumadin  Indication: atrial fibrillation, CVA  Patient Measurements: Height: 5\' 8"  (172.7 cm) Weight: 150 lb 9.2 oz (68.3 kg) IBW/kg (Calculated) : 68.4   Vital Signs: Temp: 98.6 F (37 C) (10/17 0500) Temp src: Oral (10/17 0500) BP: 135/88 mmHg (10/17 1014) Pulse Rate: 79  (10/17 1014)  Labs:  Basename 10/04/12 0715 10/03/12 0610 10/02/12 1732 10/02/12 0812 10/02/12 0124 10/01/12 2136 10/01/12 1352  HGB 11.2* 11.3* -- -- -- -- --  HCT 34.2* 35.3* -- -- 34.4* -- --  PLT 305 355 -- -- 319 -- --  APTT -- -- -- -- -- -- --  LABPROT 19.9* 20.3* -- -- 19.6* -- --  INR 1.76* 1.81* -- -- 1.72* -- --  HEPARINUNFRC -- -- -- -- -- -- --  CREATININE 1.19 1.18 -- -- 1.09 -- --  CKTOTAL -- -- 45 48 44 -- --  CKMB -- -- 3.2 3.5 3.0 -- --  TROPONINI -- -- -- -- 4.55* 4.34* 4.75*   Estimated Creatinine Clearance: 43 ml/min (by C-G formula based on Cr of 1.19).  Assessment: 78 yo male here with CVA and also noted with history of afib on coumadin PTA (2.5mg /day).  Patient was on Pradaxa but family prefers coumadin. INR today is 1.76 today which is a decrease from yesterday (likely related to held dose).  No bleeding noted, CBC stable.   Goal of Therapy:  INR 2-3 Monitor platelets by anticoagulation protocol: Yes   Plan:  1. Coumadin 5mg  PO x 1 tonight 2. F/u AM INR  Lysle Pearl, PharmD, BCPS Pager # [phone] 10/04/2012 10:57 AM

## 2012-10-04 NOTE — Progress Notes (Signed)
Orthopedic Tech Progress Note Patient Details:  Jarrid Lienhard 28-Mar-1924 [id number]  Patient ID: Dewayne Hatch, male   DOB: December 08, 1924, 78 y.o.   MRN: [id number]  Brace order completed by Storm Frisk, Ranny Wiebelhaus 10/04/2012, 4:13 PM

## 2012-10-05 ENCOUNTER — Inpatient Hospital Stay (HOSPITAL_COMMUNITY): Payer: Medicare Other | Admitting: Speech Pathology

## 2012-10-05 ENCOUNTER — Inpatient Hospital Stay (HOSPITAL_COMMUNITY): Payer: Medicare Other | Admitting: Physical Therapy

## 2012-10-05 ENCOUNTER — Inpatient Hospital Stay (HOSPITAL_COMMUNITY): Payer: Medicare Other | Admitting: Occupational Therapy

## 2012-10-05 LAB — GLUCOSE, CAPILLARY
Glucose-Capillary: 104 mg/dL — ABNORMAL HIGH (ref 70–99)
Glucose-Capillary: 106 mg/dL — ABNORMAL HIGH (ref 70–99)
Glucose-Capillary: 170 mg/dL — ABNORMAL HIGH (ref 70–99)

## 2012-10-05 LAB — PROTIME-INR
INR: 1.93 — ABNORMAL HIGH (ref 0.00–1.49)
Prothrombin Time: 21.3 s — ABNORMAL HIGH (ref 11.6–15.2)

## 2012-10-05 MED ORDER — SENNOSIDES-DOCUSATE SODIUM 8.6-50 MG PO TABS
2.0000 | ORAL_TABLET | Freq: Two times a day (BID) | ORAL | Status: DC
  Administered 2012-10-05 – 2012-10-24 (×34): 2 via ORAL
  Filled 2012-10-05 (×36): qty 2

## 2012-10-05 MED ORDER — INSULIN GLARGINE 100 UNIT/ML ~~LOC~~ SOLN
9.0000 [IU] | Freq: Two times a day (BID) | SUBCUTANEOUS | Status: DC
  Administered 2012-10-05 – 2012-10-06 (×3): 9 [IU] via SUBCUTANEOUS

## 2012-10-05 MED ORDER — WARFARIN SODIUM 5 MG PO TABS
5.0000 mg | ORAL_TABLET | Freq: Once | ORAL | Status: AC
  Administered 2012-10-05: 5 mg via ORAL
  Filled 2012-10-05: qty 1

## 2012-10-05 NOTE — Progress Notes (Signed)
Patient ID: Aaron [name], male   DOB: February 15, 1924, 78 y.o.   MRN: [id number]  Subjective/Complaints: Back pain, no stump pain or phantom limb pain + constipation Daughter states pt takes lantus in am Review of Systems  HENT: Positive for hearing loss.   Neurological: Positive for speech change and focal weakness.  All other systems reviewed and are negative.    Objective: Vital Signs: Blood pressure 151/87, pulse 86, temperature 98 F (36.7 C), temperature source Oral, resp. rate 18, height 5\' 8"  (1.727 m), weight 68.3 kg (150 lb 9.2 oz), SpO2 94.00%. Dg Chest 2 View  10/03/2012  *RADIOLOGY REPORT*  Clinical Data: Shortness of breath, CHF exacerbation  CHEST - 2 VIEW  Comparison: 09/29/2012  Findings: Enlargement of cardiac silhouette post CABG and AVR. Pulmonary vascular congestion. Mild pulmonary edema, improving. Bibasilar atelectasis and decrease in lung volumes noted. Calcified tortuous aorta. Small bibasilar effusions. No pneumothorax. Bones appear demineralized with compression deformities and vertebroplasties at 3 upper lumbar levels. Epicardial pacing wires noted.  IMPRESSION: Improving CHF. Mild bibasilar atelectasis and small pleural effusions.   Original Report Authenticated By: Lollie Marrow, M.D.    Results for orders placed during the hospital encounter of 10/03/12 (from the past 72 hour(s))  GLUCOSE, CAPILLARY     Status: Abnormal   Collection Time   10/03/12  9:02 PM      Component Value Range Comment   Glucose-Capillary 252 (*) 70 - 99 mg/dL   GLUCOSE, CAPILLARY     Status: Abnormal   Collection Time   10/04/12  2:59 AM      Component Value Range Comment   Glucose-Capillary 214 (*) 70 - 99 mg/dL    Comment 1 Notify RN     GLUCOSE, CAPILLARY     Status: Abnormal   Collection Time   10/04/12  7:10 AM      Component Value Range Comment   Glucose-Capillary 135 (*) 70 - 99 mg/dL    Comment 1 Notify RN     CBC WITH DIFFERENTIAL     Status: Abnormal   Collection Time     10/04/12  7:15 AM      Component Value Range Comment   WBC 11.6 (*) 4.0 - 10.5 K/uL    RBC 3.91 (*) 4.22 - 5.81 MIL/uL    Hemoglobin 11.2 (*) 13.0 - 17.0 g/dL    HCT 52.8 (*) 41.3 - 52.0 %    MCV 87.5  78.0 - 100.0 fL    MCH 28.6  26.0 - 34.0 pg    MCHC 32.7  30.0 - 36.0 g/dL    RDW 24.4 (*) 01.0 - 15.5 %    Platelets 305  150 - 400 K/uL    Neutrophils Relative 77  43 - 77 %    Neutro Abs 8.9 (*) 1.7 - 7.7 K/uL    Lymphocytes Relative 11 (*) 12 - 46 %    Lymphs Abs 1.2  0.7 - 4.0 K/uL    Monocytes Relative 9  3 - 12 %    Monocytes Absolute 1.1 (*) 0.1 - 1.0 K/uL    Eosinophils Relative 3  0 - 5 %    Eosinophils Absolute 0.3  0.0 - 0.7 K/uL    Basophils Relative 1  0 - 1 %    Basophils Absolute 0.1  0.0 - 0.1 K/uL   COMPREHENSIVE METABOLIC PANEL     Status: Abnormal   Collection Time   10/04/12  7:15 AM  Component Value Range Comment   Sodium 137  135 - 145 mEq/L    Potassium 3.9  3.5 - 5.1 mEq/L    Chloride 99  96 - 112 mEq/L    CO2 30  19 - 32 mEq/L    Glucose, Bld 147 (*) 70 - 99 mg/dL    BUN 24 (*) 6 - 23 mg/dL    Creatinine, Ser 1.61  0.50 - 1.35 mg/dL    Calcium 8.7  8.4 - 09.6 mg/dL    Total Protein 6.8  6.0 - 8.3 g/dL    Albumin 2.6 (*) 3.5 - 5.2 g/dL    AST 17  0 - 37 U/L    ALT 11  0 - 53 U/L    Alkaline Phosphatase 113  39 - 117 U/L    Total Bilirubin 0.5  0.3 - 1.2 mg/dL    GFR calc non Af Amer 53 (*) >90 mL/min    GFR calc Af Amer 62 (*) >90 mL/min   PROTIME-INR     Status: Abnormal   Collection Time   10/04/12  7:15 AM      Component Value Range Comment   Prothrombin Time 19.9 (*) 11.6 - 15.2 seconds    INR 1.76 (*) 0.00 - 1.49   GLUCOSE, CAPILLARY     Status: Normal   Collection Time   10/04/12 11:20 AM      Component Value Range Comment   Glucose-Capillary 76  70 - 99 mg/dL    Comment 1 Notify RN     GLUCOSE, CAPILLARY     Status: Abnormal   Collection Time   10/04/12  4:31 PM      Component Value Range Comment   Glucose-Capillary 144 (*)  70 - 99 mg/dL   GLUCOSE, CAPILLARY     Status: Abnormal   Collection Time   10/04/12  8:09 PM      Component Value Range Comment   Glucose-Capillary 222 (*) 70 - 99 mg/dL    Comment 1 Notify RN     GLUCOSE, CAPILLARY     Status: Abnormal   Collection Time   10/04/12 11:56 PM      Component Value Range Comment   Glucose-Capillary 106 (*) 70 - 99 mg/dL    Comment 1 Notify RN     GLUCOSE, CAPILLARY     Status: Abnormal   Collection Time   10/05/12  5:09 AM      Component Value Range Comment   Glucose-Capillary 121 (*) 70 - 99 mg/dL   PROTIME-INR     Status: Abnormal   Collection Time   10/05/12  7:15 AM      Component Value Range Comment   Prothrombin Time 21.3 (*) 11.6 - 15.2 seconds    INR 1.93 (*) 0.00 - 1.49   GLUCOSE, CAPILLARY     Status: Abnormal   Collection Time   10/05/12  7:50 AM      Component Value Range Comment   Glucose-Capillary 170 (*) 70 - 99 mg/dL    Comment 1 Notify RN        HEENT: poor dentition Cardio: irregular Resp: Wheezes GI: BS positive and non tender no masses Extremity:  No Edema Skin:   Intact and Wound Well healed L BKA Neuro: Alert/Oriented, Cranial Nerve Abnormalities mild R central 7, Abnormal Sensory mild reduction to lt touch in R foot, Abnormal Motor o/5 R wrist extensors, 0/5 finger extensors adn flexor, 3- at R bi , tri, delt, 4/5  R HF KE 2-/5 R ankle DF and PF, Abnormal FMC Ataxic/ dec FMC and Dysarthric Musc/Skel:  Other L BKA without edema or tenderness, R foot with red scaly skin and intrinsic muscle atrophy and reduced pulses but foot is warm no apraxia evident   Assessment/Plan: 1. Functional deficits secondary to L posterior frontal infarct with R HP and recent L BKA which require 3+ hours per day of interdisciplinary therapy in a comprehensive inpatient rehab setting. Physiatrist is providing close team supervision and 24 hour management of active medical problems listed below. Physiatrist and rehab team continue to assess  barriers to discharge/monitor patient progress toward functional and medical goals. FIM: FIM - Bathing Bathing Steps Patient Completed: Chest;Right Arm;Abdomen;Front perineal area;Right upper leg;Left upper leg Bathing: 3: Mod-Patient completes 5-7 71f 10 parts or 50-74%  FIM - Upper Body Dressing/Undressing Upper body dressing/undressing steps patient completed: Thread/unthread right sleeve of pullover shirt/dresss;Thread/unthread left sleeve of pullover shirt/dress;Put head through opening of pull over shirt/dress;Pull shirt over trunk Upper body dressing/undressing: 4: Min-Patient completed 75 plus % of tasks FIM - Lower Body Dressing/Undressing Lower body dressing/undressing steps patient completed: Thread/unthread left underwear leg;Thread/unthread left pants leg Lower body dressing/undressing: 2: Max-Patient completed 25-49% of tasks        FIM - Bed/Chair Transfer Bed/Chair Transfer: 4: Supine > Sit: Min A (steadying Pt. > 75%/lift 1 leg);1: Bed > Chair or W/C: Total A (helper does all/Pt. < 25%);1: Chair or W/C > Bed: Total A (helper does all/Pt. < 25%);1: Two helpers  FIM - Locomotion: Wheelchair Locomotion: Wheelchair: 1: Travels less than 50 ft with supervision, cueing or coaxing (decreased activity tolerance, SOB) FIM - Locomotion: Ambulation Locomotion: Ambulation:  (Unable to perform secondary to being unsafe in standing)  Comprehension Comprehension Mode: Auditory Comprehension: 5-Follows basic conversation/direction: With extra time/assistive device  Expression Expression Mode: Verbal Expression: 5-Expresses basic needs/ideas: With extra time/assistive device  Social Interaction Social Interaction: 6-Interacts appropriately with others with medication or extra time (anti-anxiety, antidepressant).  Problem Solving Problem Solving: 5-Solves basic 90% of the time/requires cueing < 10% of the time  Memory Memory: 3-Recognizes or recalls 50 - 74% of the time/requires  cueing 25 - 49% of the time  Medical Problem List and Plan:  1. Left frontal lobe embolic infarct/recent left below-knee amputation 07/22/2012  2. DVT Prophylaxis/Anticoagulation: Chronic Coumadin therapy. Monitor for any bleeding episodes  3. Pain Management: Tylenol as needed. Monitor with increased mobility  4. Neuropsych: This patient is capable of making decisions on his/her own behalf.  5. Hypertension/atrial fibrillation. Cardiac rate controlled. Continue Norvasc 10 mg daily, Lopressor 75 mg twice a day and Demadex 10 mg twice a day. Monitor with increased activity  6. Diabetes mellitus. Hemoglobin A1c of 8.0. Continue Lantus insulin 18 units daily. Check blood sugars a.c. and at bedtime  7. Mild compression fractures T7 and T10 as well his chronic compression fractures with cemented vertebral augmentation T11, T12 and L1. No plans for a further vertebral plasty at this time and followup as outpatient, would need to be off warfarin which would increase CVA risk -advise the use of a soft lumbar-thoracic corset  -analgesic 8. Left BKA- well shaped. Continue stump shrinker.  -prosthesis at some point after dc once improved from a stroke and back stand point.    LOS (Days) 2 A FACE TO FACE EVALUATION WAS PERFORMED  Aaron [name] 10/05/2012, 7:58 AM

## 2012-10-05 NOTE — Progress Notes (Signed)
I have read and agree with the following treatment session.  Taressa Rauh Hall, PT, DPT 

## 2012-10-05 NOTE — Progress Notes (Signed)
Speech Language Pathology Daily Session Note  Patient Details  Name: Balam Mercedes MRN: [id number] Date of Birth: 1924-11-22  Today's Date: 10/05/2012 Time: 1510-1550 Time Calculation (min): 40 min  Short Term Goals: Week 1: SLP Short Term Goal 1 (Week 1): Patient will identify two strategies to facilitate increased speech intelligibility with mod assist.   SLP Short Term Goal 2 (Week 1): Patient will utilize speech intelligibility strategies during conversation with mod assist visual and verbal cues.   SLP Short Term Goal 3 (Week 1): Patient will identify two strategies to facilitate increased recall of new information with mod assist.   SLP Short Term Goal 4 (Week 1): Patient will use external and internal memory aids to facilitate increased recall of new information with mod assist verbal cues.   SLP Short Term Goal 5 (Week 1): Patient will tolerate trials of thin liquids and regular textures without the use of a chin tuck with no overt s/s of aspiration with min assist for precautions.    Skilled Therapeutic Interventions: Treatment session focused on addressing swallowing and speech intelligibility goals.  SLP facilitated session with trials of thin via straw with cues to utilize chin tuck and no overt s/s of aspiration.  SLP also facilitated session by educating patient regarding speech intelligibility strategies and then min assist cues in a structured, known context activity.   FIM:  Comprehension Comprehension Mode: Auditory Comprehension: 5-Follows basic conversation/direction: With extra time/assistive device Expression Expression Mode: Verbal Expression: 5-Expresses basic needs/ideas: With extra time/assistive device Social Interaction Social Interaction: 6-Interacts appropriately with others with medication or extra time (anti-anxiety, antidepressant). Problem Solving Problem Solving: 5-Solves basic 90% of the time/requires cueing < 10% of the time Memory Memory:  3-Recognizes or recalls 50 - 74% of the time/requires cueing 25 - 49% of the time FIM - Eating Eating Activity: 5: Supervision/cues  Pain Pain Assessment Pain Assessment: No/denies pain  Therapy/Group: Individual Therapy  Charlane Ferretti., CCC-SLP [phone]  Giliana Vantil 10/05/2012, 4:33 PM

## 2012-10-05 NOTE — Progress Notes (Signed)
Physical Therapy Session Note  Patient Details  Name: Aaron [name] MRN: [id number] Date of Birth: [birth year]  Today's Date: 10/05/2012 Time: 0900-1000 Time Calculation (min): 60 min  Short Term Goals: Week 1:  PT Short Term Goal 1 (Week 1): Patient will perform bed mobility without use of rails and HOB flat with supervision. PT Short Term Goal 2 (Week 1): Patient will perform supine > sit transfer with supervision. PT Short Term Goal 3 (Week 1): Patient will perform sit to stand transfer with UE support and max assist PT Short Term Goal 4 (Week 1): Patient will propel w/c 50 feet with RLE and LUE with supervision     Therapy Documentation Precautions:  Precautions Precautions: Fall;Back Precaution Comments: Lumbar corset to be worn OOB; LLE BKA, AFIB, maintain Sp02 >90% on 2L 02 assess vitals Required Braces or Orthoses: Spinal Brace Spinal Brace: Lumbar corset Restrictions Weight Bearing Restrictions: No   Vital Signs: Therapy Vitals Pulse Rate: 76  BP: 138/76 mmHg Oxygen Therapy SpO2: 99 % O2 Device: Nasal cannula O2 Flow Rate (L/min): 1 L/min Pain: Pain Assessment Pain Assessment: No/denies pain   Other Treatments:   Patient lying in bed with HOB elevated, receiving medications upon arrival of PT. Patient donned boxers prior to performing bed mobility with min assist for set up and placing boxers over R foot. Patient able to perform rolling bilaterally, using bed rails to assist with donning process. Required rest secondary to SOB after. O2: 88%. Patient performed rolling bilaterally, using bedrails and UE assist for therapist to apply lumbar corset.  Patient performed supine > sit transfer using BUEs and min guard for safety/steadying. Patient performed bed > w/c with mod assist. Required vcs for technique and safety. Patient continued to be SOB with O2 sats dropping to 74%. RN was notified and reported she would talk to PA. Supplemental O2 was applied via [place] @ 2L. O2  after: 83-84%. Patient reported he did not feel any more short of breath than what he normally does.  Patient stood at sink using UEs and Mod A in order to don robe. Helper was needed to assist with donning robe. Patient returned to w/c.  Patient performed alternating marching with BLEs in sitting x 15 reps; LAQ bilaterally x 15 reps with emphasis on LE strengthening; sitting reaching activities with emphasis on maintaining upright posture, strengthening core musculature, and postural control; while waiting on breathing treatment. Respiratory therapist came into patient's room and administered breathing treatment. O2: 100% during treatment. Patient left with respiratory therapist with call bell in reach.  See FIM for current functional status  Therapy/Group: Individual Therapy  Vyolet Sakuma Hamilton DPT Student  10/05/2012, 11:04 AM

## 2012-10-05 NOTE — Progress Notes (Signed)
Called to room by OT oxygen level droped to 74% with activity. Harvel Ricks, PA ordered for 2L of oxygen via [place] if O2 sat <90%. Will continue to monitor.

## 2012-10-05 NOTE — Progress Notes (Signed)
Occupational Therapy Session Note  Patient Details  Name: Aaron [name] MRN: [id number] Date of Birth: [birth year]  Today's Date: 10/05/2012 Time: 9562-1308 and 1350-1420 Time Calculation (min): 55 min and 30 min  Short Term Goals: Week 1:  OT Short Term Goal 1 (Week 1): Pt will complete LB bathing with min assist at sit to stand level OT Short Term Goal 2 (Week 1): Pt will complete LB dressing with mod assist OT Short Term Goal 3 (Week 1): Pt will complete toilet transfers with mod assist OT Short Term Goal 4 (Week 1): Pt will complete toileting (clothing management and hygiene) with mod assist  Skilled Therapeutic Interventions/Progress Updates:    1) Pt seen for ADL retraining with focus on bed mobility, sit <> stand, and stand pivot transfers.  Pt very ornery this AM, with initial refusal to participate in therapy and fluctuating responses to cues from this therapist.  Pt refused bathing, but completed dressing task with +2 assist for safety in standing for LB dressing.  Pt extremely fearful of falling and with stand pivot transfer to Rt reports "I'm falling" despite secure positioning with therapist.  Pt completed grooming in sitting at sink.  2) 1:1 OT with focus on bed mobility and toilet transfers.  Educated pt on appropriate donning of lumbar corset, with pt unable to recall how to don it even with verbal cues.  Engaged in stand pivot transfers to/from toilet with use of BSC over toilet with reaching towards hand rail.  Pt mod-max assist with stand pivot and continues to be impulsive secondary to fearfulness of falling.  Therapy Documentation Precautions:  Precautions Precautions: Fall;Back Precaution Comments: Lumbar corset to be worn OOB; LLE BKA, AFIB, maintain Sp02 >90% on 2L 02 assess vitals Required Braces or Orthoses: Spinal Brace Spinal Brace: Lumbar corset Restrictions Weight Bearing Restrictions: No General:   Vital Signs: Therapy Vitals Pulse Rate: 76  BP: 138/76  mmHg Oxygen Therapy SpO2: 99 % O2 Device: Nasal cannula O2 Flow Rate (L/min): 1 L/min Pain: Pain Assessment Pain Assessment: No/denies pain  See FIM for current functional status  Therapy/Group: Individual Therapy  [name] 10/05/2012, 12:29 PM

## 2012-10-05 NOTE — Progress Notes (Signed)
ANTICOAGULATION CONSULT NOTE - Follow Up Consult  Pharmacy Consult for coumadin  Indication: atrial fibrillation, CVA  Patient Measurements: Height: 5\' 8"  (172.7 cm) Weight: 150 lb 9.2 oz (68.3 kg) IBW/kg (Calculated) : 68.4   Vital Signs: Temp: 98 F (36.7 C) (10/18 0509) Temp src: Oral (10/18 0509) BP: 138/76 mmHg (10/18 0849) Pulse Rate: 76  (10/18 0849)  Labs:  Basename 10/05/12 0715 10/04/12 0715 10/03/12 0610 10/02/12 1732  HGB -- 11.2* 11.3* --  HCT -- 34.2* 35.3* --  PLT -- 305 355 --  APTT -- -- -- --  LABPROT 21.3* 19.9* 20.3* --  INR 1.93* 1.76* 1.81* --  HEPARINUNFRC -- -- -- --  CREATININE -- 1.19 1.18 --  CKTOTAL -- -- -- 45  CKMB -- -- -- 3.2  TROPONINI -- -- -- --   Estimated Creatinine Clearance: 43 ml/min (by C-G formula based on Cr of 1.19).  Assessment: 78 yo male here with CVA and also noted with history of afib on coumadin PTA (2.5mg /day).  Patient was on Pradaxa but family prefers coumadin. INR today is 1.93, approaching goal, but slightly subtherapeutic. No bleeding noted, CBC stable.   Goal of Therapy:  INR 2-3 Monitor platelets by anticoagulation protocol: Yes   Plan:  1. Coumadin 5mg  PO x 1 again tonight, will resume home Coumadin regimen if AM INR therapeutic. 2. F/u daily PT/INR  Thanks, Chantella Creech K. Allena Katz, PharmD, BCPS.  Clinical Pharmacist Pager [phone]. 10/05/2012 1:12 PM

## 2012-10-05 NOTE — Progress Notes (Signed)
Pt called out complaining of what sounded to be difficulty breathing. Upon entering rm pt with tachypnea and wasn't talking well. o2 applied for pt comfort despite sats being %93 on RM Air. Pt vital as follows  Filed Vitals:   10/05/12 2333  BP: 150/81  Pulse: 74  Temp:   Resp:    Will continue to monitor as pt is now resting more comfortably and RR is decreased [place]

## 2012-10-06 ENCOUNTER — Inpatient Hospital Stay (HOSPITAL_COMMUNITY): Payer: Medicare Other | Admitting: Speech Pathology

## 2012-10-06 ENCOUNTER — Encounter (HOSPITAL_COMMUNITY): Payer: Medicare Other | Admitting: *Deleted

## 2012-10-06 ENCOUNTER — Inpatient Hospital Stay (HOSPITAL_COMMUNITY): Payer: Medicare Other | Admitting: Physical Therapy

## 2012-10-06 ENCOUNTER — Inpatient Hospital Stay (HOSPITAL_COMMUNITY): Payer: Medicare Other | Admitting: Occupational Therapy

## 2012-10-06 LAB — GLUCOSE, CAPILLARY
Glucose-Capillary: 175 mg/dL — ABNORMAL HIGH (ref 70–99)
Glucose-Capillary: 444 mg/dL — ABNORMAL HIGH (ref 70–99)
Glucose-Capillary: 154 mg/dL — ABNORMAL HIGH (ref 70–99)

## 2012-10-06 LAB — PROTIME-INR: Prothrombin Time: 29 seconds — ABNORMAL HIGH (ref 11.6–15.2)

## 2012-10-06 MED ORDER — INSULIN ASPART 100 UNIT/ML ~~LOC~~ SOLN
20.0000 [IU] | Freq: Once | SUBCUTANEOUS | Status: AC
  Administered 2012-10-06: 20 [IU] via SUBCUTANEOUS

## 2012-10-06 NOTE — Progress Notes (Signed)
ANTICOAGULATION CONSULT NOTE - Follow Up Consult  Pharmacy Consult for coumadin  Indication: atrial fibrillation, CVA  Patient Measurements: Height: 5\' 8"  (172.7 cm) Weight: 150 lb 9.2 oz (68.3 kg) IBW/kg (Calculated) : 68.4   Vital Signs: Temp: 97.8 F (36.6 C) (10/19 0500) Temp src: Oral (10/19 0500) BP: 138/73 mmHg (10/19 0500) Pulse Rate: 86  (10/19 0500)  Labs:  Basename 10/06/12 0627 10/05/12 0715 10/04/12 0715  HGB -- -- 11.2*  HCT -- -- 34.2*  PLT -- -- 305  APTT -- -- --  LABPROT 29.0* 21.3* 19.9*  INR 2.92* 1.93* 1.76*  HEPARINUNFRC -- -- --  CREATININE -- -- 1.19  CKTOTAL -- -- --  CKMB -- -- --  TROPONINI -- -- --   Estimated Creatinine Clearance: 43 ml/min (by C-G formula based on Cr of 1.19).  Assessment: 78 yo male here with CVA and also noted with history of afib on coumadin PTA (2.5mg /day).  Patient was on Pradaxa but family prefers coumadin. INR today is 2.92 which is a huge increase from yesterday. No bleeding noted, no CBC today.   Goal of Therapy:  INR 2-3 Monitor platelets by anticoagulation protocol: Yes  Plan:  1. No coumadin tonight due to very large increase in INR 2. F/u AM INR  Lysle Pearl, PharmD, BCPS Pager # [phone] 10/06/2012 10:49 AM

## 2012-10-06 NOTE — Progress Notes (Signed)
Occupational Therapy Session Note  Patient Details  Name: Aaron [name] MRN: [id number] Date of Birth: 02-07-1924  Today's Date: 10/06/2012 Time: 0800-0900 Time Calculation (min): 60 min  Short Term Goals: Week 1:  OT Short Term Goal 1 (Week 1): Pt will complete LB bathing with min assist at sit to stand level OT Short Term Goal 2 (Week 1): Pt will complete LB dressing with mod assist OT Short Term Goal 3 (Week 1): Pt will complete toilet transfers with mod assist OT Short Term Goal 4 (Week 1): Pt will complete toileting (clothing management and hygiene) with mod assist  Skilled Therapeutic Interventions/Progress Updates:    Pt seen for ADL retraining with focus on bed mobility, stand pivot transfers, sit <> stand, and LB dressing.  Pt finishing breakfast upon arrival, required assist to open containers secondary to dominant RUE weakness.  Pt demonstrated increased independence and initiation with bed mobility and use of bed rails.  Pt reports needing to have BM, stand pivot transfer from bed to w/c and w/c to toilet with max physical assist and cues secondary to impulsivity and fearfulness of falling.  Pt required assist with hygiene and clothing management, demonstrating increased use of BUE and stability on RLE to maintain standing while this therapist steadied and completed hygiene.  Pt supervision with donning t-shirt style shirt, but requires increased time, verbal cues and min assist with long sleeved or button up shirt (pulled over head with buttons already done).    Therapy Documentation Precautions:  Precautions Precautions: Fall;Back Precaution Comments: Lumbar corset to be worn OOB; LLE BKA, AFIB, maintain Sp02 >90% on 2L 02 assess vitals Required Braces or Orthoses: Spinal Brace Spinal Brace: Lumbar corset Restrictions Weight Bearing Restrictions: No Pain:   Pt with no c/o pain this session.  See FIM for current functional status  Therapy/Group: Individual  Therapy  [name] 10/06/2012, 11:15 AM

## 2012-10-06 NOTE — Progress Notes (Signed)
Physical Therapy Note  Patient Details  Name: Livio Ledwith MRN: [id number] Date of Birth: 09-12-1924 Today's Date: 10/06/2012  1100 Pt unable to participate in PT group session focused on bilateral LE strengthening secondary to high blood sugar per nursing.   Shonteria Abeln,[name] 10/06/2012, 8:13 AM

## 2012-10-06 NOTE — Progress Notes (Signed)
Orthopedic Tech Progress Note Patient Details:  Aaron [name] 1924-06-13 [id number]  Patient ID: Dewayne Hatch, male   DOB: 1924-10-27, 78 y.o.   MRN: [id number]   Shawnie Pons 10/06/2012, 10:29 AM LUMBAR CORSET COMPETED BY [facility] 10/04/12

## 2012-10-06 NOTE — Progress Notes (Signed)
Patient ID: Aaron [name], male   DOB: 22-Aug-1924, 78 y.o.   MRN: [id number]  Subjective/Complaints:  No new complaints   Back pain, no stump pain or phantom limb pain + constipation Daughter states pt takes lantus in am Review of Systems  HENT: Positive for hearing loss.   Neurological: Positive for speech change and focal weakness.  All other systems reviewed and are negative.    Objective: Vital Signs: Blood pressure 138/73, pulse 86, temperature 97.8 F (36.6 C), temperature source Oral, resp. rate 18, height 5\' 8"  (1.727 m), weight 150 lb 9.2 oz (68.3 kg), SpO2 100.00%. No results found. Results for orders placed during the hospital encounter of 10/03/12 (from the past 72 hour(s))  GLUCOSE, CAPILLARY     Status: Abnormal   Collection Time   10/03/12  9:02 PM      Component Value Range Comment   Glucose-Capillary 252 (*) 70 - 99 mg/dL   GLUCOSE, CAPILLARY     Status: Abnormal   Collection Time   10/04/12  2:59 AM      Component Value Range Comment   Glucose-Capillary 214 (*) 70 - 99 mg/dL    Comment 1 Notify RN     GLUCOSE, CAPILLARY     Status: Abnormal   Collection Time   10/04/12  7:10 AM      Component Value Range Comment   Glucose-Capillary 135 (*) 70 - 99 mg/dL    Comment 1 Notify RN     CBC WITH DIFFERENTIAL     Status: Abnormal   Collection Time   10/04/12  7:15 AM      Component Value Range Comment   WBC 11.6 (*) 4.0 - 10.5 K/uL    RBC 3.91 (*) 4.22 - 5.81 MIL/uL    Hemoglobin 11.2 (*) 13.0 - 17.0 g/dL    HCT 40.9 (*) 81.1 - 52.0 %    MCV 87.5  78.0 - 100.0 fL    MCH 28.6  26.0 - 34.0 pg    MCHC 32.7  30.0 - 36.0 g/dL    RDW 91.4 (*) 78.2 - 15.5 %    Platelets 305  150 - 400 K/uL    Neutrophils Relative 77  43 - 77 %    Neutro Abs 8.9 (*) 1.7 - 7.7 K/uL    Lymphocytes Relative 11 (*) 12 - 46 %    Lymphs Abs 1.2  0.7 - 4.0 K/uL    Monocytes Relative 9  3 - 12 %    Monocytes Absolute 1.1 (*) 0.1 - 1.0 K/uL    Eosinophils Relative 3  0 - 5 %    Eosinophils Absolute 0.3  0.0 - 0.7 K/uL    Basophils Relative 1  0 - 1 %    Basophils Absolute 0.1  0.0 - 0.1 K/uL   COMPREHENSIVE METABOLIC PANEL     Status: Abnormal   Collection Time   10/04/12  7:15 AM      Component Value Range Comment   Sodium 137  135 - 145 mEq/L    Potassium 3.9  3.5 - 5.1 mEq/L    Chloride 99  96 - 112 mEq/L    CO2 30  19 - 32 mEq/L    Glucose, Bld 147 (*) 70 - 99 mg/dL    BUN 24 (*) 6 - 23 mg/dL    Creatinine, Ser 9.56  0.50 - 1.35 mg/dL    Calcium 8.7  8.4 - 21.3 mg/dL    Total Protein 6.8  6.0 - 8.3 g/dL    Albumin 2.6 (*) 3.5 - 5.2 g/dL    AST 17  0 - 37 U/L    ALT 11  0 - 53 U/L    Alkaline Phosphatase 113  39 - 117 U/L    Total Bilirubin 0.5  0.3 - 1.2 mg/dL    GFR calc non Af Amer 53 (*) >90 mL/min    GFR calc Af Amer 62 (*) >90 mL/min   PROTIME-INR     Status: Abnormal   Collection Time   10/04/12  7:15 AM      Component Value Range Comment   Prothrombin Time 19.9 (*) 11.6 - 15.2 seconds    INR 1.76 (*) 0.00 - 1.49   GLUCOSE, CAPILLARY     Status: Normal   Collection Time   10/04/12 11:20 AM      Component Value Range Comment   Glucose-Capillary 76  70 - 99 mg/dL    Comment 1 Notify RN     GLUCOSE, CAPILLARY     Status: Abnormal   Collection Time   10/04/12  4:31 PM      Component Value Range Comment   Glucose-Capillary 144 (*) 70 - 99 mg/dL   GLUCOSE, CAPILLARY     Status: Abnormal   Collection Time   10/04/12  8:09 PM      Component Value Range Comment   Glucose-Capillary 222 (*) 70 - 99 mg/dL    Comment 1 Notify RN     GLUCOSE, CAPILLARY     Status: Abnormal   Collection Time   10/04/12 11:56 PM      Component Value Range Comment   Glucose-Capillary 106 (*) 70 - 99 mg/dL    Comment 1 Notify RN     GLUCOSE, CAPILLARY     Status: Abnormal   Collection Time   10/05/12  5:09 AM      Component Value Range Comment   Glucose-Capillary 121 (*) 70 - 99 mg/dL   PROTIME-INR     Status: Abnormal   Collection Time   10/05/12  7:15 AM       Component Value Range Comment   Prothrombin Time 21.3 (*) 11.6 - 15.2 seconds    INR 1.93 (*) 0.00 - 1.49   GLUCOSE, CAPILLARY     Status: Abnormal   Collection Time   10/05/12  7:50 AM      Component Value Range Comment   Glucose-Capillary 170 (*) 70 - 99 mg/dL    Comment 1 Notify RN     GLUCOSE, CAPILLARY     Status: Abnormal   Collection Time   10/05/12 11:39 AM      Component Value Range Comment   Glucose-Capillary 301 (*) 70 - 99 mg/dL    Comment 1 Notify RN     GLUCOSE, CAPILLARY     Status: Abnormal   Collection Time   10/05/12  4:05 PM      Component Value Range Comment   Glucose-Capillary 104 (*) 70 - 99 mg/dL   GLUCOSE, CAPILLARY     Status: Abnormal   Collection Time   10/05/12  8:10 PM      Component Value Range Comment   Glucose-Capillary 318 (*) 70 - 99 mg/dL   GLUCOSE, CAPILLARY     Status: Abnormal   Collection Time   10/05/12 11:55 PM      Component Value Range Comment   Glucose-Capillary 415 (*) 70 - 99 mg/dL    Comment 1  Notify RN     GLUCOSE, CAPILLARY     Status: Abnormal   Collection Time   10/06/12  3:12 AM      Component Value Range Comment   Glucose-Capillary 173 (*) 70 - 99 mg/dL   PROTIME-INR     Status: Abnormal   Collection Time   10/06/12  6:27 AM      Component Value Range Comment   Prothrombin Time 29.0 (*) 11.6 - 15.2 seconds    INR 2.92 (*) 0.00 - 1.49   GLUCOSE, CAPILLARY     Status: Abnormal   Collection Time   10/06/12  7:46 AM      Component Value Range Comment   Glucose-Capillary 175 (*) 70 - 99 mg/dL     CBG (last 3)   Basename 10/06/12 0746 10/06/12 0312 10/05/12 2355  GLUCAP 175* 173* 415*     HEENT: poor dentition Cardio: irregular Resp: Wheezes GI: BS positive and non tender no masses Extremity:  No Edema Skin:   Intact and Wound Well healed L BKA Neuro: Alert/Oriented, Cranial Nerve Abnormalities mild R central 7, Abnormal Sensory mild reduction to lt touch in R foot, Abnormal Motor o/5 R wrist extensors,  0/5 finger extensors adn flexor, 3- at R bi , tri, delt, 4/5 R HF KE 2-/5 R ankle DF and PF, Abnormal FMC Ataxic/ dec FMC and Dysarthric Musc/Skel:  Other L BKA without edema or tenderness, R foot with red scaly skin and intrinsic muscle atrophy and reduced pulses but foot is warm no apraxia evident   Assessment/Plan: 1. Functional deficits secondary to L posterior frontal infarct with R HP and recent L BKA which require 3+ hours per day of interdisciplinary therapy in a comprehensive inpatient rehab setting. Physiatrist is providing close team supervision and 24 hour management of active medical problems listed below. Physiatrist and rehab team continue to assess barriers to discharge/monitor patient progress toward functional and medical goals. FIM: FIM - Bathing Bathing Steps Patient Completed: Chest;Right Arm;Abdomen;Front perineal area;Right upper leg;Left upper leg Bathing: 3: Mod-Patient completes 5-7 37f 10 parts or 50-74%  FIM - Upper Body Dressing/Undressing Upper body dressing/undressing steps patient completed: Put head through opening of pull over shirt/dress;Pull shirt over trunk Upper body dressing/undressing: 3: Mod-Patient completed 50-74% of tasks FIM - Lower Body Dressing/Undressing Lower body dressing/undressing steps patient completed: Thread/unthread left pants leg Lower body dressing/undressing: 1: Two helpers        FIM - Games developer Transfer: 2: Chair or W/C > Bed: Max A (lift and lower assist)  FIM - Locomotion: Wheelchair Locomotion: Wheelchair: 1: Total Assistance/staff pushes wheelchair (Pt<25%) FIM - Locomotion: Ambulation Locomotion: Ambulation: 0: Activity did not occur  Comprehension Comprehension Mode: Auditory Comprehension: 5-Follows basic conversation/direction: With extra time/assistive device  Expression Expression Mode: Verbal Expression: 5-Expresses basic needs/ideas: With extra time/assistive device  Social  Interaction Social Interaction: 6-Interacts appropriately with others with medication or extra time (anti-anxiety, antidepressant).  Problem Solving Problem Solving: 5-Solves basic 90% of the time/requires cueing < 10% of the time  Memory Memory: 3-Recognizes or recalls 50 - 74% of the time/requires cueing 25 - 49% of the time  Medical Problem List and Plan:  1. Left frontal lobe embolic infarct/recent left below-knee amputation 07/22/2012  2. DVT Prophylaxis/Anticoagulation: Chronic Coumadin therapy. Monitor for any bleeding episodes  3. Pain Management: Tylenol as needed. Monitor with increased mobility  4. Neuropsych: This patient is capable of making decisions on his/her own behalf.  5. Hypertension/atrial fibrillation. Cardiac rate controlled.  Continue Norvasc 10 mg daily, Lopressor 75 mg twice a day and Demadex 10 mg twice a day. Monitor with increased activity  6. Diabetes mellitus. Hemoglobin A1c of 8.0. Continue Lantus insulin 18 units daily - am. Check blood sugars a.c. and at bedtime  7. Mild compression fractures T7 and T10 as well his chronic compression fractures with cemented vertebral augmentation T11, T12 and L1. No plans for a further vertebral plasty at this time and followup as outpatient, would need to be off warfarin which would increase CVA risk -advise the use of a soft lumbar-thoracic corset  -analgesic 8. Left BKA- well shaped. Continue stump shrinker.  -prosthesis at some point after dc once improved from a stroke and back stand point.    LOS (Days) 3 A FACE TO FACE EVALUATION WAS PERFORMED  Alex Plotnikov 10/06/2012, 9:14 AM

## 2012-10-06 NOTE — Progress Notes (Signed)
Physical Therapy Note  Patient Details  Name: Aaron [name] MRN: [id number] Date of Birth: Nov 27, 1924 Today's Date: 10/06/2012  1300-1325 (25 minutes) individual Pain: no complaint of pain Focus of treatment: wc mobility, transfer training Treatment: supine to sit SBA with bed rail; scooting forward to edge of bed min assist; transfer- scoot mod/max assist;  total assist donning corset ; wc mobility 75 feet X 2 using RT LE/LT UE SBA (distance limited by fatigue); transfer wc >< mat with sliding board setup + min assist for safety .    Aaron [name],[name] 10/06/2012, 2:50 PM

## 2012-10-06 NOTE — Progress Notes (Signed)
Blood sugar 444 before lunch. MD notified. 20 units ordered to cover patient. No adverse symptoms or complaints. Breathing noted to be regular with shallow breaths at rest in bed at 1530. Slight decrease in breathing depth. On oxygen 2 liters. Vital signs stable. Respiratory consulted. Patient denies shortness of breath, no wheezing. To follow up with MD if condition worsens or change in status. Family at bedside, aware of plan of care. Sherlyn Lees

## 2012-10-07 ENCOUNTER — Inpatient Hospital Stay (HOSPITAL_COMMUNITY): Payer: Medicare Other | Admitting: *Deleted

## 2012-10-07 ENCOUNTER — Inpatient Hospital Stay (HOSPITAL_COMMUNITY): Payer: Medicare Other

## 2012-10-07 LAB — PROTIME-INR
INR: 3.23 — ABNORMAL HIGH (ref 0.00–1.49)
Prothrombin Time: 31.2 seconds — ABNORMAL HIGH (ref 11.6–15.2)

## 2012-10-07 LAB — GLUCOSE, CAPILLARY
Glucose-Capillary: 148 mg/dL — ABNORMAL HIGH (ref 70–99)
Glucose-Capillary: 233 mg/dL — ABNORMAL HIGH (ref 70–99)
Glucose-Capillary: 514 mg/dL — ABNORMAL HIGH (ref 70–99)
Glucose-Capillary: 75 mg/dL (ref 70–99)
Glucose-Capillary: 90 mg/dL (ref 70–99)

## 2012-10-07 MED ORDER — INSULIN GLARGINE 100 UNIT/ML ~~LOC~~ SOLN: 18.0000 [IU] | Freq: Every day | SUBCUTANEOUS | Status: DC

## 2012-10-07 NOTE — Progress Notes (Signed)
ANTICOAGULATION CONSULT NOTE - Follow Up Consult  Pharmacy Consult for coumadin  Indication: atrial fibrillation, CVA  Patient Measurements: Height: 5\' 8"  (172.7 cm) Weight: 150 lb 9.2 oz (68.3 kg) IBW/kg (Calculated) : 68.4   Vital Signs: Temp: 98.3 F (36.8 C) (10/20 0500) Temp src: Oral (10/20 0500) BP: 125/74 mmHg (10/20 0500) Pulse Rate: 69  (10/20 0500)  Labs:  Basename 10/07/12 0605 10/06/12 0627 10/05/12 0715  HGB -- -- --  HCT -- -- --  PLT -- -- --  APTT -- -- --  LABPROT 31.2* 29.0* 21.3*  INR 3.23* 2.92* 1.93*  HEPARINUNFRC -- -- --  CREATININE -- -- --  CKTOTAL -- -- --  CKMB -- -- --  TROPONINI -- -- --   Estimated Creatinine Clearance: 43 ml/min (by C-G formula based on Cr of 1.19).  Assessment: 78 yo male here with CVA and also noted with history of afib on coumadin PTA (2.5mg /day).  Patient was on Pradaxa but family prefers coumadin. INR today is 3.23. Dose was held last night No bleeding noted, no CBC today.   Goal of Therapy:  INR 2-3 Monitor platelets by anticoagulation protocol: Yes  Plan:  1. No coumadin tonight 2. F/u AM INR  Lysle Pearl, PharmD, BCPS Pager # [phone] 10/07/2012 10:03 AM

## 2012-10-07 NOTE — Progress Notes (Signed)
Occupational Therapy Session Note  Patient Details  Name: Aaron [name] MRN: [id number] Date of Birth: [birth year]  Today's Date: 10/07/2012 Time: 4098-1191 Time Calculation (min): 45 min  Short Term Goals: Week 1:  OT Short Term Goal 1 (Week 1): Pt will complete LB bathing with min assist at sit to stand level OT Short Term Goal 2 (Week 1): Pt will complete LB dressing with mod assist OT Short Term Goal 3 (Week 1): Pt will complete toilet transfers with mod assist OT Short Term Goal 4 (Week 1): Pt will complete toileting (clothing management and hygiene) with mod assist  Skilled Therapeutic Interventions/Progress Updates: Therapeutic activities with emphasis on R-UE strengthening, squat-pivot transfers, general endurance (using UE ergometry: SCIFIT), and w/c mobility.  Patient maintained 02 use during treatment and reported fatigue after 5 min using SCIFIT.     Therapy Documentation Precautions:  Precautions Precautions: Fall;Back Precaution Comments: Lumbar corset to be worn OOB; LLE BKA, AFIB, maintain Sp02 >90% on 2L 02 assess vitals Required Braces or Orthoses: Spinal Brace Spinal Brace: Lumbar corset Restrictions Weight Bearing Restrictions: No  Pain: Pain Assessment Pain Score:   4 Pain Type: Neuropathic pain Pain Location: Arm Pain Orientation: Posterior;Proximal Pain Descriptors: Shooting Pain Frequency: Rarely Pain Onset: Sudden Patients Stated Pain Goal: 0 Pain Intervention(s): Distraction  See FIM for current functional status  Therapy/Group: Individual Therapy  Georgeanne Nim 10/07/2012, 2:48 PM

## 2012-10-07 NOTE — Progress Notes (Signed)
Patient ID: Aaron [name], male   DOB: 1924-05-27, 78 y.o.   MRN: [id number]  Subjective/Complaints:  No new complaints this am  Back pain, no stump pain or phantom limb pain + constipation Daughter states pt takes lantus in am Review of Systems  HENT: Positive for hearing loss.   Neurological: Positive for speech change and focal weakness.  All other systems reviewed and are negative.   CBG (last 3)   Basename 10/07/12 0748 10/07/12 0410 10/06/12 2358  GLUCAP 75 90 152*      Objective: Vital Signs: Blood pressure 125/74, pulse 69, temperature 98.3 F (36.8 C), temperature source Oral, resp. rate 20, height 5\' 8"  (1.727 m), weight 150 lb 9.2 oz (68.3 kg), SpO2 96.00%. No results found. Results for orders placed during the hospital encounter of 10/03/12 (from the past 72 hour(s))  GLUCOSE, CAPILLARY     Status: Normal   Collection Time   10/04/12 11:20 AM      Component Value Range Comment   Glucose-Capillary 76  70 - 99 mg/dL    Comment 1 Notify RN     GLUCOSE, CAPILLARY     Status: Abnormal   Collection Time   10/04/12  4:31 PM      Component Value Range Comment   Glucose-Capillary 144 (*) 70 - 99 mg/dL   GLUCOSE, CAPILLARY     Status: Abnormal   Collection Time   10/04/12  8:09 PM      Component Value Range Comment   Glucose-Capillary 222 (*) 70 - 99 mg/dL    Comment 1 Notify RN     GLUCOSE, CAPILLARY     Status: Abnormal   Collection Time   10/04/12 11:56 PM      Component Value Range Comment   Glucose-Capillary 106 (*) 70 - 99 mg/dL    Comment 1 Notify RN     GLUCOSE, CAPILLARY     Status: Abnormal   Collection Time   10/05/12  5:09 AM      Component Value Range Comment   Glucose-Capillary 121 (*) 70 - 99 mg/dL   PROTIME-INR     Status: Abnormal   Collection Time   10/05/12  7:15 AM      Component Value Range Comment   Prothrombin Time 21.3 (*) 11.6 - 15.2 seconds    INR 1.93 (*) 0.00 - 1.49   GLUCOSE, CAPILLARY     Status: Abnormal   Collection Time     10/05/12  7:50 AM      Component Value Range Comment   Glucose-Capillary 170 (*) 70 - 99 mg/dL    Comment 1 Notify RN     GLUCOSE, CAPILLARY     Status: Abnormal   Collection Time   10/05/12 11:39 AM      Component Value Range Comment   Glucose-Capillary 301 (*) 70 - 99 mg/dL    Comment 1 Notify RN     GLUCOSE, CAPILLARY     Status: Abnormal   Collection Time   10/05/12  4:05 PM      Component Value Range Comment   Glucose-Capillary 104 (*) 70 - 99 mg/dL   GLUCOSE, CAPILLARY     Status: Abnormal   Collection Time   10/05/12  8:10 PM      Component Value Range Comment   Glucose-Capillary 318 (*) 70 - 99 mg/dL   GLUCOSE, CAPILLARY     Status: Abnormal   Collection Time   10/05/12 11:55 PM  Component Value Range Comment   Glucose-Capillary 415 (*) 70 - 99 mg/dL    Comment 1 Notify RN     GLUCOSE, CAPILLARY     Status: Abnormal   Collection Time   10/06/12  3:12 AM      Component Value Range Comment   Glucose-Capillary 173 (*) 70 - 99 mg/dL   PROTIME-INR     Status: Abnormal   Collection Time   10/06/12  6:27 AM      Component Value Range Comment   Prothrombin Time 29.0 (*) 11.6 - 15.2 seconds    INR 2.92 (*) 0.00 - 1.49   GLUCOSE, CAPILLARY     Status: Abnormal   Collection Time   10/06/12  7:46 AM      Component Value Range Comment   Glucose-Capillary 175 (*) 70 - 99 mg/dL   GLUCOSE, CAPILLARY     Status: Abnormal   Collection Time   10/06/12 11:07 AM      Component Value Range Comment   Glucose-Capillary 444 (*) 70 - 99 mg/dL   GLUCOSE, CAPILLARY     Status: Abnormal   Collection Time   10/06/12  4:07 PM      Component Value Range Comment   Glucose-Capillary 144 (*) 70 - 99 mg/dL   GLUCOSE, CAPILLARY     Status: Abnormal   Collection Time   10/06/12  8:33 PM      Component Value Range Comment   Glucose-Capillary 154 (*) 70 - 99 mg/dL    Comment 1 Notify RN     GLUCOSE, CAPILLARY     Status: Abnormal   Collection Time   10/06/12 11:58 PM       Component Value Range Comment   Glucose-Capillary 152 (*) 70 - 99 mg/dL    Comment 1 Notify RN     GLUCOSE, CAPILLARY     Status: Normal   Collection Time   10/07/12  4:10 AM      Component Value Range Comment   Glucose-Capillary 90  70 - 99 mg/dL    Comment 1 Notify RN     PROTIME-INR     Status: Abnormal   Collection Time   10/07/12  6:05 AM      Component Value Range Comment   Prothrombin Time 31.2 (*) 11.6 - 15.2 seconds    INR 3.23 (*) 0.00 - 1.49   GLUCOSE, CAPILLARY     Status: Normal   Collection Time   10/07/12  7:48 AM      Component Value Range Comment   Glucose-Capillary 75  70 - 99 mg/dL     CBG (last 3)   Basename 10/07/12 0748 10/07/12 0410 10/06/12 2358  GLUCAP 75 90 152*     HEENT: poor dentition Cardio: irregular Resp: Wheezes GI: BS positive and non tender no masses Extremity:  No Edema Skin:   Intact and Wound Well healed L BKA Neuro: Alert/Oriented, Cranial Nerve Abnormalities mild R central 7, Abnormal Sensory mild reduction to lt touch in R foot, Abnormal Motor o/5 R wrist extensors, 0/5 finger extensors adn flexor, 3- at R bi , tri, delt, 4/5 R HF KE 2-/5 R ankle DF and PF, Abnormal FMC Ataxic/ dec FMC and Dysarthric Musc/Skel:  Other L BKA without edema or tenderness, R foot with red scaly skin and intrinsic muscle atrophy and reduced pulses but foot is warm no apraxia evident   Assessment/Plan: 1. Functional deficits secondary to L posterior frontal infarct with R HP and recent  L BKA which require 3+ hours per day of interdisciplinary therapy in a comprehensive inpatient rehab setting. Physiatrist is providing close team supervision and 24 hour management of active medical problems listed below. Physiatrist and rehab team continue to assess barriers to discharge/monitor patient progress toward functional and medical goals. FIM: FIM - Bathing Bathing Steps Patient Completed: Chest;Right Arm;Abdomen;Front perineal area;Right upper leg;Left upper  leg Bathing: 3: Mod-Patient completes 5-7 24f 10 parts or 50-74%  FIM - Upper Body Dressing/Undressing Upper body dressing/undressing steps patient completed: Thread/unthread right sleeve of pullover shirt/dresss;Thread/unthread left sleeve of pullover shirt/dress;Put head through opening of pull over shirt/dress;Pull shirt over trunk Upper body dressing/undressing: 4: Min-Patient completed 75 plus % of tasks FIM - Lower Body Dressing/Undressing Lower body dressing/undressing steps patient completed: Thread/unthread left underwear leg;Thread/unthread left pants leg Lower body dressing/undressing: 2: Max-Patient completed 25-49% of tasks  FIM - Toileting Toileting steps completed by patient: Adjust clothing prior to toileting Toileting Assistive Devices: Grab bar or rail for support Toileting: 2: Max-Patient completed 1 of 3 steps  FIM - Diplomatic Services operational officer Devices: Grab bars Toilet Transfers: 2-To toilet/BSC: Max A (lift and lower assist);2-From toilet/BSC: Max A (lift and lower assist)  FIM - Press photographer Assistive Devices: Bed rails;Arm rests Bed/Chair Transfer: 4: Supine > Sit: Min A (steadying Pt. > 75%/lift 1 leg);2: Bed > Chair or W/C: Max A (lift and lower assist)  FIM - Locomotion: Wheelchair Locomotion: Wheelchair: 1: Total Assistance/staff pushes wheelchair (Pt<25%) FIM - Locomotion: Ambulation Locomotion: Ambulation: 0: Activity did not occur  Comprehension Comprehension Mode: Auditory Comprehension: 5-Follows basic conversation/direction: With extra time/assistive device  Expression Expression Mode: Verbal Expression: 5-Expresses basic needs/ideas: With extra time/assistive device  Social Interaction Social Interaction: 3-Interacts appropriately 50 - 74% of the time - May be physically or verbally inappropriate.  Problem Solving Problem Solving: 3-Solves basic 50 - 74% of the time/requires cueing 25 - 49% of the  time  Memory Memory: 3-Recognizes or recalls 50 - 74% of the time/requires cueing 25 - 49% of the time  Medical Problem List and Plan:   1. Left frontal lobe embolic infarct/recent left below-knee amputation 07/22/2012  2. DVT Prophylaxis/Anticoagulation: Chronic Coumadin therapy. Monitor for any bleeding episodes  3. Pain Management: Tylenol as needed. Monitor with increased mobility  4. Neuropsych: This patient is capable of making decisions on his/her own behalf.  5. Hypertension/atrial fibrillation. Cardiac rate controlled. Continue Norvasc 10 mg daily, Lopressor 75 mg twice a day and Demadex 10 mg twice a day. Monitor with increased activity  6. Diabetes mellitus. Hemoglobin A1c of 8.0. Lantus insulin 18 units daily - am. Check blood sugars a.c. and at bedtime  7. Mild compression fractures T7 and T10 as well his chronic compression fractures with cemented vertebral augmentation T11, T12 and L1. No plans for a further vertebral plasty at this time and followup as outpatient, would need to be off warfarin which would increase CVA risk -advise the use of a soft lumbar-thoracic corset  -analgesic 8. Left BKA- well shaped. Continue stump shrinker.  -prosthesis at some point after dc once improved from a stroke and back stand point.    LOS (Days) 4 A FACE TO FACE EVALUATION WAS PERFORMED  Alex Kemaria Dedic 10/07/2012, 8:36 AM

## 2012-10-08 ENCOUNTER — Inpatient Hospital Stay (HOSPITAL_COMMUNITY): Payer: Medicare Other | Admitting: Speech Pathology

## 2012-10-08 ENCOUNTER — Inpatient Hospital Stay (HOSPITAL_COMMUNITY): Payer: Medicare Other | Admitting: *Deleted

## 2012-10-08 ENCOUNTER — Inpatient Hospital Stay (HOSPITAL_COMMUNITY): Payer: Medicare Other | Admitting: Occupational Therapy

## 2012-10-08 LAB — GLUCOSE, CAPILLARY
Glucose-Capillary: 45 mg/dL — ABNORMAL LOW (ref 70–99)
Glucose-Capillary: 82 mg/dL (ref 70–99)
Glucose-Capillary: 251 mg/dL — ABNORMAL HIGH (ref 70–99)

## 2012-10-08 LAB — PROTIME-INR: INR: 2.76 — ABNORMAL HIGH (ref 0.00–1.49)

## 2012-10-08 MED ORDER — INSULIN ASPART 100 UNIT/ML ~~LOC~~ SOLN
20.0000 [IU] | Freq: Once | SUBCUTANEOUS | Status: AC
  Administered 2012-10-08: 20 [IU] via SUBCUTANEOUS

## 2012-10-08 MED ORDER — WARFARIN SODIUM 2.5 MG PO TABS
2.5000 mg | ORAL_TABLET | Freq: Once | ORAL | Status: AC
  Administered 2012-10-08: 2.5 mg via ORAL
  Filled 2012-10-08: qty 1

## 2012-10-08 MED ORDER — INSULIN ASPART 100 UNIT/ML ~~LOC~~ SOLN
0.0000 [IU] | SUBCUTANEOUS | Status: DC
  Administered 2012-10-08: 11 [IU] via SUBCUTANEOUS

## 2012-10-08 MED ORDER — INSULIN GLARGINE 100 UNIT/ML ~~LOC~~ SOLN
9.0000 [IU] | Freq: Once | SUBCUTANEOUS | Status: AC
  Administered 2012-10-08: 9 [IU] via SUBCUTANEOUS

## 2012-10-08 MED ORDER — INSULIN ASPART 100 UNIT/ML ~~LOC~~ SOLN
9.0000 [IU] | Freq: Once | SUBCUTANEOUS | Status: AC
  Administered 2012-10-08: 9 [IU] via SUBCUTANEOUS

## 2012-10-08 MED ORDER — INSULIN ASPART 100 UNIT/ML ~~LOC~~ SOLN
0.0000 [IU] | Freq: Every day | SUBCUTANEOUS | Status: DC
  Administered 2012-10-08: 3 [IU] via SUBCUTANEOUS
  Administered 2012-10-09: 5 [IU] via SUBCUTANEOUS
  Administered 2012-10-11: 2 [IU] via SUBCUTANEOUS
  Administered 2012-10-12: 3 [IU] via SUBCUTANEOUS
  Administered 2012-10-13 – 2012-10-14 (×2): 2 [IU] via SUBCUTANEOUS
  Administered 2012-10-17: 4 [IU] via SUBCUTANEOUS

## 2012-10-08 MED ORDER — INSULIN ASPART 100 UNIT/ML ~~LOC~~ SOLN
0.0000 [IU] | Freq: Three times a day (TID) | SUBCUTANEOUS | Status: DC
  Administered 2012-10-08: 3 [IU] via SUBCUTANEOUS
  Administered 2012-10-08: 5 [IU] via SUBCUTANEOUS
  Administered 2012-10-09 (×2): 8 [IU] via SUBCUTANEOUS
  Administered 2012-10-09: 3 [IU] via SUBCUTANEOUS
  Administered 2012-10-10: 8 [IU] via SUBCUTANEOUS
  Administered 2012-10-10: 11 [IU] via SUBCUTANEOUS
  Administered 2012-10-11: 8 [IU] via SUBCUTANEOUS
  Administered 2012-10-11 – 2012-10-13 (×5): 5 [IU] via SUBCUTANEOUS
  Administered 2012-10-13: 3 [IU] via SUBCUTANEOUS
  Administered 2012-10-14: 8 [IU] via SUBCUTANEOUS
  Administered 2012-10-14: 2 [IU] via SUBCUTANEOUS
  Administered 2012-10-14: 5 [IU] via SUBCUTANEOUS
  Administered 2012-10-15 – 2012-10-16 (×2): 2 [IU] via SUBCUTANEOUS
  Administered 2012-10-16: 3 [IU] via SUBCUTANEOUS
  Administered 2012-10-17: 5 [IU] via SUBCUTANEOUS
  Administered 2012-10-17: 11 [IU] via SUBCUTANEOUS
  Administered 2012-10-18 (×2): 5 [IU] via SUBCUTANEOUS
  Administered 2012-10-19: 2 [IU] via SUBCUTANEOUS
  Administered 2012-10-19: 8 [IU] via SUBCUTANEOUS
  Administered 2012-10-20: 11 [IU] via SUBCUTANEOUS
  Administered 2012-10-20 – 2012-10-21 (×3): 3 [IU] via SUBCUTANEOUS
  Administered 2012-10-21: 15 [IU] via SUBCUTANEOUS
  Administered 2012-10-22: 5 [IU] via SUBCUTANEOUS
  Administered 2012-10-22 – 2012-10-23 (×2): 3 [IU] via SUBCUTANEOUS
  Administered 2012-10-23: 5 [IU] via SUBCUTANEOUS
  Administered 2012-10-24: 3 [IU] via SUBCUTANEOUS

## 2012-10-08 NOTE — Progress Notes (Signed)
Paged Dr. Posey Rea at at 0130 with CBG results and serum glucose level of 580.  20 units of Novolog given at 0135 per orders. SSI orders changed to every 4 hours to coincide with CBG checks. Patient without complaint of. Patient requesting CBG to be checked. At 0245 CBG=558, paged Dr. And orders received to give Lantus 9 units and Novolog 9 units, meds given at 0255. Will continue to monitor.Aaron [name] A

## 2012-10-08 NOTE — Progress Notes (Signed)
0500 CBG checked per patient's request and covered per SSI orders. Cbg=272. Aaron [name] A

## 2012-10-08 NOTE — Progress Notes (Signed)
Patient ID: Aaron [name], male   DOB: 1924/06/03, 78 y.o.   MRN: [id number]   Subjective/Complaints: CBG resulted noted, discussed with night RN adn Day RN as well as pt.  Was receiving Lantus 18Unit qam at home Was doing well with 18Unit qhs in hospital but we were in transition to changing back to home regimen with 9Unit BID over weekend Also pt checked CBG Q 4 hr at home but only dosed novalog ac and hs SSI changed over weekend to resistant and to Q 4hr  Ate a good breakfast Tired currently and hearing aids are out Review of Systems  Unable to perform ROS: mental acuity   Objective: Vital Signs: Blood pressure 129/66, pulse 67, temperature 98.3 F (36.8 C), temperature source Oral, resp. rate 19, height 5\' 8"  (1.727 m), weight 68.3 kg (150 lb 9.2 oz), SpO2 98.00%. No results found. Results for orders placed during the hospital encounter of 10/03/12 (from the past 72 hour(s))  GLUCOSE, CAPILLARY     Status: Abnormal   Collection Time   10/05/12 11:39 AM      Component Value Range Comment   Glucose-Capillary 301 (*) 70 - 99 mg/dL    Comment 1 Notify RN     GLUCOSE, CAPILLARY     Status: Abnormal   Collection Time   10/05/12  4:05 PM      Component Value Range Comment   Glucose-Capillary 104 (*) 70 - 99 mg/dL   GLUCOSE, CAPILLARY     Status: Abnormal   Collection Time   10/05/12  8:10 PM      Component Value Range Comment   Glucose-Capillary 318 (*) 70 - 99 mg/dL   GLUCOSE, CAPILLARY     Status: Abnormal   Collection Time   10/05/12 11:55 PM      Component Value Range Comment   Glucose-Capillary 415 (*) 70 - 99 mg/dL    Comment 1 Notify RN     GLUCOSE, CAPILLARY     Status: Abnormal   Collection Time   10/06/12  3:12 AM      Component Value Range Comment   Glucose-Capillary 173 (*) 70 - 99 mg/dL   PROTIME-INR     Status: Abnormal   Collection Time   10/06/12  6:27 AM      Component Value Range Comment   Prothrombin Time 29.0 (*) 11.6 - 15.2 seconds    INR 2.92 (*)  0.00 - 1.49   GLUCOSE, CAPILLARY     Status: Abnormal   Collection Time   10/06/12  7:46 AM      Component Value Range Comment   Glucose-Capillary 175 (*) 70 - 99 mg/dL   GLUCOSE, CAPILLARY     Status: Abnormal   Collection Time   10/06/12 11:07 AM      Component Value Range Comment   Glucose-Capillary 444 (*) 70 - 99 mg/dL   GLUCOSE, CAPILLARY     Status: Abnormal   Collection Time   10/06/12  4:07 PM      Component Value Range Comment   Glucose-Capillary 144 (*) 70 - 99 mg/dL   GLUCOSE, CAPILLARY     Status: Abnormal   Collection Time   10/06/12  8:33 PM      Component Value Range Comment   Glucose-Capillary 154 (*) 70 - 99 mg/dL    Comment 1 Notify RN     GLUCOSE, CAPILLARY     Status: Abnormal   Collection Time   10/06/12 11:58 PM  Component Value Range Comment   Glucose-Capillary 152 (*) 70 - 99 mg/dL    Comment 1 Notify RN     GLUCOSE, CAPILLARY     Status: Normal   Collection Time   10/07/12  4:10 AM      Component Value Range Comment   Glucose-Capillary 90  70 - 99 mg/dL    Comment 1 Notify RN     PROTIME-INR     Status: Abnormal   Collection Time   10/07/12  6:05 AM      Component Value Range Comment   Prothrombin Time 31.2 (*) 11.6 - 15.2 seconds    INR 3.23 (*) 0.00 - 1.49   GLUCOSE, CAPILLARY     Status: Normal   Collection Time   10/07/12  7:48 AM      Component Value Range Comment   Glucose-Capillary 75  70 - 99 mg/dL   GLUCOSE, CAPILLARY     Status: Abnormal   Collection Time   10/07/12 11:06 AM      Component Value Range Comment   Glucose-Capillary 193 (*) 70 - 99 mg/dL   GLUCOSE, CAPILLARY     Status: Abnormal   Collection Time   10/07/12  4:07 PM      Component Value Range Comment   Glucose-Capillary 148 (*) 70 - 99 mg/dL   GLUCOSE, CAPILLARY     Status: Abnormal   Collection Time   10/07/12  8:07 PM      Component Value Range Comment   Glucose-Capillary 233 (*) 70 - 99 mg/dL    Comment 1 Notify RN     GLUCOSE, CAPILLARY     Status:  Abnormal   Collection Time   10/07/12 11:54 PM      Component Value Range Comment   Glucose-Capillary 514 (*) 70 - 99 mg/dL    Comment 1 Notify RN     GLUCOSE, RANDOM     Status: Abnormal   Collection Time   10/08/12 12:42 AM      Component Value Range Comment   Glucose, Bld 580 (*) 70 - 99 mg/dL   GLUCOSE, CAPILLARY     Status: Abnormal   Collection Time   10/08/12  2:43 AM      Component Value Range Comment   Glucose-Capillary 558 (*) 70 - 99 mg/dL    Comment 1 Notify RN     GLUCOSE, CAPILLARY     Status: Abnormal   Collection Time   10/08/12  5:01 AM      Component Value Range Comment   Glucose-Capillary 272 (*) 70 - 99 mg/dL    Comment 1 Notify RN     GLUCOSE, CAPILLARY     Status: Abnormal   Collection Time   10/08/12  7:25 AM      Component Value Range Comment   Glucose-Capillary 45 (*) 70 - 99 mg/dL    Comment 1 Notify RN        HEENT: normal Cardio: irregular Resp: CTA B/L GI: BS positive Extremity:  Cyanosis Skin:   Breakdown R great toe tip ulcer Neuro: Lethargic, Abnormal Motor R side 3-/5 in UE and 3/5 in LE and Dysarthric Musc/Skel:  Other L BKA   Assessment/Plan: 1. Functional deficits secondary to L frontal infarct which require 3+ hours per day of interdisciplinary therapy in a comprehensive inpatient rehab setting. Physiatrist is providing close team supervision and 24 hour management of active medical problems listed below. Physiatrist and rehab team continue to assess barriers to  discharge/monitor patient progress toward functional and medical goals. FIM: FIM - Bathing Bathing Steps Patient Completed: Chest;Right Arm;Abdomen;Front perineal area;Right upper leg;Left upper leg Bathing: 3: Mod-Patient completes 5-7 49f 10 parts or 50-74%  FIM - Upper Body Dressing/Undressing Upper body dressing/undressing steps patient completed: Thread/unthread right sleeve of pullover shirt/dresss;Thread/unthread left sleeve of pullover shirt/dress;Put head through  opening of pull over shirt/dress;Pull shirt over trunk Upper body dressing/undressing: 4: Min-Patient completed 75 plus % of tasks FIM - Lower Body Dressing/Undressing Lower body dressing/undressing steps patient completed: Thread/unthread left underwear leg;Thread/unthread left pants leg Lower body dressing/undressing: 2: Max-Patient completed 25-49% of tasks  FIM - Toileting Toileting steps completed by patient: Adjust clothing prior to toileting Toileting Assistive Devices: Grab bar or rail for support Toileting: 2: Max-Patient completed 1 of 3 steps  FIM - Diplomatic Services operational officer Devices: Grab bars Toilet Transfers: 2-To toilet/BSC: Max A (lift and lower assist);2-From toilet/BSC: Max A (lift and lower assist)  FIM - Press photographer Assistive Devices: Bed rails;Arm rests Bed/Chair Transfer: 4: Supine > Sit: Min A (steadying Pt. > 75%/lift 1 leg);2: Bed > Chair or W/C: Max A (lift and lower assist)  FIM - Locomotion: Wheelchair Locomotion: Wheelchair: 1: Total Assistance/staff pushes wheelchair (Pt<25%) FIM - Locomotion: Ambulation Locomotion: Ambulation: 0: Activity did not occur  Comprehension Comprehension Mode: Auditory Comprehension: 5-Understands basic 90% of the time/requires cueing < 10% of the time  Expression Expression Mode: Verbal Expression: 5-Expresses basic 90% of the time/requires cueing < 10% of the time.  Social Interaction Social Interaction: 3-Interacts appropriately 50 - 74% of the time - May be physically or verbally inappropriate.  Problem Solving Problem Solving: 3-Solves basic 50 - 74% of the time/requires cueing 25 - 49% of the time  Memory Memory: 3-Recognizes or recalls 50 - 74% of the time/requires cueing 25 - 49% of the time  Medical Problem List and Plan:  1. Left frontal lobe embolic infarct/recent left below-knee amputation 07/22/2012  2. DVT Prophylaxis/Anticoagulation: Chronic Coumadin therapy.  Monitor for any bleeding episodes  3. Pain Management: Tylenol as needed. Monitor with increased mobility  4. Neuropsych: This patient is capable of making decisions on his/her own behalf.  5. Hypertension/atrial fibrillation. Cardiac rate controlled. Continue Norvasc 10 mg daily, Lopressor 75 mg twice a day and Demadex 10 mg twice a day. Monitor with increased activity  6. Diabetes mellitus. Hemoglobin A1c of 8.0. Continue Lantus insulin 18 units daily. Check blood sugars a.c. and at bedtime see discussion above 7. Mild compression fractures T7 and T10 as well his chronic compression fractures with cemented vertebral augmentation T11, T12 and L1. No plans for a further vertebral plasty at this time and followup as outpatient  -advise the use of a soft lumbar-thoracic corset  -lidoderm patches  8. Left BKA- well shaped. Continue stump shrinker.  -prosthesis at some point after dc once improved from a stroke and back stand point.   LOS (Days) 5 A FACE TO FACE EVALUATION WAS PERFORMED  KIRSTEINS,ANDREW E 10/08/2012, 8:06 AM

## 2012-10-08 NOTE — Progress Notes (Signed)
Patient lethargic, unable to keep eyes open for OT.  BP 143/75, P 71, Temp. 97.4(oral).  Blood sugar at 0725 was 45, Dr. Doroteo Bradford notified and assessed patient.  Patient ate 80% breakfast, drank orange juice, CBG recheck 82 at 0807.  Recheck blood sugar at 0904 was 109 and 9 Units of Lantus given as ordered.  Patient woke up at 0950 asked for urinal and was interacting with staffs.  Will continue to monitor patient and follow the protocol for hypoglycemic.

## 2012-10-08 NOTE — Plan of Care (Signed)
Hypoglycemic Event  CBG: 82  Treatment: 15 GM carbohydrate snack  Symptoms: None  Follow-up CBG: Time:0904 CBG Result:109  Possible Reasons for Event: Unknown  Comments/MD notified:  Dr. Doroteo Bradford modified Insulin, order to give 9 units of lantus    Aaron [name]  Remember to initiate Hypoglycemia Order Set & complete

## 2012-10-08 NOTE — Plan of Care (Signed)
Hypoglycemic Event  CBG: 45  Treatment: Ate 80% breakfast, orange juice  Symptoms: Sweaty  Follow-up CBG: Time:0807 CBG Result:82  Possible Reasons for Event: unknown  Comments/MD notified: Dr. Doroteo Bradford at bedside, order to wait until the patient eats his breakfast and recheck CBG.    Aaron [name]  Remember to initiate Hypoglycemia Order Set & complete

## 2012-10-08 NOTE — Progress Notes (Signed)
Occupational Therapy Note  Patient Details  Name: Aaron [name] MRN: [id number] Date of Birth: Jan 30, 1924 Today's Date: 10/08/2012 Time: 0830-0850 (20 mins)  Pt missed 40 mins skilled OT treatment secondary to extreme lethargy and fatigue with inability to keep eyes open greater than 5 seconds.  Pt required increased time and tactile cues to arouse, with pt opening eyes to tactile stimulus and looking at this therapist reporting "I'm so tired" and falling back asleep.  Pt unsafe to participate in therapy session as unable to remain aroused and alert, despite all lights on in room.  RN notified.  BP taken 142/84, O2 94%, and HR 71.  Repositioned pt in bed.  Will follow up as able.  [name] 10/08/2012, 9:29 AM

## 2012-10-08 NOTE — Progress Notes (Signed)
SLP Cancellation Note  Patient Details Name: Aaron [name] MRN: [id number] DOB: Nov 20, 1924   Cancelled treatment:        Patient missed 45 minutes of skilled SLP services due to fatigue and inability to stay aroused to participate in therapy.    Fae Pippin, M.A., CCC-SLP [phone]  Aaron [name] 10/08/2012, 10:16 AM

## 2012-10-08 NOTE — Progress Notes (Signed)
Physical Therapy Session Note  Patient Details  Name: Aaron [name] MRN: [id number] Date of Birth: 1924/02/02  Today's Date: 10/08/2012 Time: 1015-1100; 1300-1345 Time Calculation (min): 45 min; 45 min  Short Term Goals: Week 1:  PT Short Term Goal 1 (Week 1): Patient will perform bed mobility without use of rails and HOB flat with supervision. PT Short Term Goal 2 (Week 1): Patient will perform supine > sit transfer with supervision. PT Short Term Goal 3 (Week 1): Patient will perform sit to stand transfer with UE support and max assist PT Short Term Goal 4 (Week 1): Patient will propel w/c 50 feet with RLE and LUE with supervision     Therapy Documentation Precautions:  Precautions Precautions: Fall Precaution Comments: Lumbar corset to be worn OOB; LLE BKA, AFIB, maintain Sp02 >90% on 2L 02 assess vitals Required Braces or Orthoses: Spinal Brace Spinal Brace: Lumbar corset Restrictions Weight Bearing Restrictions: No   Vital Signs: Therapy Vitals Pulse Rate: 98  Resp: 19  BP: 143/75 mmHg Patient Position, if appropriate: Sitting Oxygen Therapy SpO2: 99 % O2 Device: None (Room air) Pain: Pain Assessment Pain Assessment: No/denies pain   Other Treatments:   Emphasis of today's session: bed mobility, activity tolerance, standing tolerance. Patient in bed upon PT arrival. Patient demonstrated ability to put pants on legs in supine, needing assist to adjust and fasten from PT. Patient performed rolling bilaterally using bed rail and supervision, in order for PT to don lumbar corset prior to sitting at EOB. Patient performed roll to L without bedrail and sidelying > sit transfer requiring min assist at trunk for both. Patient performed scooting at EOB to set up for bed > w/c transfer with supervision. Patient able to sit unsupported x ~30 sec prior to performing bed > w/c transfer to R, requiring mod assist for R hand placement and lift/lower assist for safety. Patient has poor  sitting posture and sitting endurance, resulting in requiring intermittent min assist to maintain upright posture. Patient required max step-by-step vcs for all transfers. Patient propelled w/c from room < > day room (~50 feet) with intermittent min assist for directing/safety. Patient stood at table in day room x 90 sec, requiring mod assist of 2 helpers for sit > stand transfer. Patient required min assist of 1 helper once in standing for safety. Patient performed L hip abd and extension x 10 reps each, while maintaining balance in standing with BUE support and min A for safety/steadying. At end of session patient was returned to bed to rest prior to afternoon therapies. Patient performed w/c > bed transfer to the R with min assist for safety.  Second session: Patient lying in bed upon PT arrival. Patient performed supine > sit transfer at EOB with HOB elevated and use of bed rails and supervision. Patient performed bed > w/c transfer to the L with min assist and mod vcs for sequencing, technique. Patient propelled w/c ~100 feet with supervision prior to needing total assistance rest of the way to PT gym. Patient performed sit > stand transfer in parallel bars with BUE support and mod assist x 2 helpers. Once in standing patient able to perform L hip abd x 10 reps with min assist. Patient ambulated forward 5 feet, backward 5 feet in parallel bars with BUE support and mod assist x 2 helpers for safety, posture, and to keep RUE on parallel bar. Patient performed push ups, using push up handles x 5 reps x 2 sets with 3 sec hold, requiring  min assist for R hand placement and vcs for form/technique. Patient performed forward trunk leans x 5 reps with emphasis on maintaining upright posture, leading with chest when performing forward leans. Progressed by having patient perform reaching tasks with emphasis on maintaining upright posture and balance while reaching outside base of support and using BUEs for reaching.  Required min assist for safety and form. Patient returned to room by PT and left sitting in w/c with call bell in reach.   See FIM for current functional status  Therapy/Group: Individual Therapy  Janylah Belgrave Hamilton DPT Student  10/08/2012, 11:42 AM

## 2012-10-08 NOTE — Progress Notes (Signed)
CBG at midnight=514. Stat serum glucose ordered per protocol. Called lab at 0005 to make aware of stat lab. Patient asymptomatic. Aaron [name]

## 2012-10-08 NOTE — Progress Notes (Signed)
ANTICOAGULATION CONSULT NOTE - Follow Up Consult  Pharmacy Consult for coumadin  Indication: atrial fibrillation, secondary stroke prophylaxis  Patient Measurements: Height: 5\' 8"  (172.7 cm) Weight: 150 lb 9.2 oz (68.3 kg) IBW/kg (Calculated) : 68.4   Vital Signs: Temp: 98.3 F (36.8 C) (10/21 0600) Temp src: Oral (10/21 0600) BP: 143/75 mmHg (10/21 0950) Pulse Rate: 98  (10/21 1142)  Labs:  Basename 10/08/12 0716 10/07/12 0605 10/06/12 0627  HGB -- -- --  HCT -- -- --  PLT -- -- --  APTT -- -- --  LABPROT 27.8* 31.2* 29.0*  INR 2.76* 3.23* 2.92*  HEPARINUNFRC -- -- --  CREATININE -- -- --  CKTOTAL -- -- --  CKMB -- -- --  TROPONINI -- -- --   Estimated Creatinine Clearance: 43 ml/min (by C-G formula based on Cr of 1.19).  Assessment: 78 y/o male patient with history of afib and stroke on coumadin PTA (2.5mg /day).  Patient was on Pradaxa but family prefers coumadin. INR today is therapeutic and trend down after dose held.   Goal of Therapy:  INR 2-3 Monitor platelets by anticoagulation protocol: Yes  Plan:  Coumadin 2.5mg  today and f/u INR/CBC in am.  Verlene Mayer, PharmD, BCPS Pager [phone]  10/08/2012 12:56 PM

## 2012-10-08 NOTE — Progress Notes (Signed)
I have read and agree with treatment note.  Clydene Laming, PT 10/08/2012 4:49 PM

## 2012-10-09 ENCOUNTER — Inpatient Hospital Stay (HOSPITAL_COMMUNITY): Payer: Medicare Other | Admitting: Physical Therapy

## 2012-10-09 ENCOUNTER — Inpatient Hospital Stay (HOSPITAL_COMMUNITY): Payer: Medicare Other | Admitting: Occupational Therapy

## 2012-10-09 ENCOUNTER — Inpatient Hospital Stay (HOSPITAL_COMMUNITY): Payer: Medicare Other | Admitting: Speech Pathology

## 2012-10-09 LAB — CBC
Platelets: 325 10*3/uL (ref 150–400)
RBC: 3.89 MIL/uL — ABNORMAL LOW (ref 4.22–5.81)
WBC: 8.9 10*3/uL (ref 4.0–10.5)

## 2012-10-09 LAB — GLUCOSE, CAPILLARY
Glucose-Capillary: 343 mg/dL — ABNORMAL HIGH (ref 70–99)
Glucose-Capillary: 396 mg/dL — ABNORMAL HIGH (ref 70–99)

## 2012-10-09 LAB — PROTIME-INR: Prothrombin Time: 28.7 seconds — ABNORMAL HIGH (ref 11.6–15.2)

## 2012-10-09 MED ORDER — WARFARIN SODIUM 2 MG PO TABS
2.0000 mg | ORAL_TABLET | Freq: Once | ORAL | Status: AC
  Administered 2012-10-09: 2 mg via ORAL
  Filled 2012-10-09: qty 1

## 2012-10-09 MED ORDER — DULOXETINE HCL 30 MG PO CPEP
30.0000 mg | ORAL_CAPSULE | Freq: Every day | ORAL | Status: DC
  Administered 2012-10-09 – 2012-10-24 (×16): 30 mg via ORAL
  Filled 2012-10-09 (×18): qty 1

## 2012-10-09 MED ORDER — INSULIN GLARGINE 100 UNIT/ML ~~LOC~~ SOLN
18.0000 [IU] | Freq: Every day | SUBCUTANEOUS | Status: DC
  Administered 2012-10-09 – 2012-10-10 (×2): 18 [IU] via SUBCUTANEOUS

## 2012-10-09 NOTE — Progress Notes (Signed)
Patient ID: Aaron [name], male   DOB: Apr 11, 1924, 78 y.o.   MRN: [id number]   Subjective/Complaints: Pt slept ok Asking about therapy schedule Review of Systems  Unable to perform ROS: mental acuity   Objective: Vital Signs: Blood pressure 146/76, pulse 80, temperature 97.7 F (36.5 C), temperature source Oral, resp. rate 18, height 5\' 8"  (1.727 m), weight 68.3 kg (150 lb 9.2 oz), SpO2 97.00%. No results found. Results for orders placed during the hospital encounter of 10/03/12 (from the past 72 hour(s))  GLUCOSE, CAPILLARY     Status: Abnormal   Collection Time   10/06/12  7:46 AM      Component Value Range Comment   Glucose-Capillary 175 (*) 70 - 99 mg/dL   GLUCOSE, CAPILLARY     Status: Abnormal   Collection Time   10/06/12 11:07 AM      Component Value Range Comment   Glucose-Capillary 444 (*) 70 - 99 mg/dL   GLUCOSE, CAPILLARY     Status: Abnormal   Collection Time   10/06/12  4:07 PM      Component Value Range Comment   Glucose-Capillary 144 (*) 70 - 99 mg/dL   GLUCOSE, CAPILLARY     Status: Abnormal   Collection Time   10/06/12  8:33 PM      Component Value Range Comment   Glucose-Capillary 154 (*) 70 - 99 mg/dL    Comment 1 Notify RN     GLUCOSE, CAPILLARY     Status: Abnormal   Collection Time   10/06/12 11:58 PM      Component Value Range Comment   Glucose-Capillary 152 (*) 70 - 99 mg/dL    Comment 1 Notify RN     GLUCOSE, CAPILLARY     Status: Normal   Collection Time   10/07/12  4:10 AM      Component Value Range Comment   Glucose-Capillary 90  70 - 99 mg/dL    Comment 1 Notify RN     PROTIME-INR     Status: Abnormal   Collection Time   10/07/12  6:05 AM      Component Value Range Comment   Prothrombin Time 31.2 (*) 11.6 - 15.2 seconds    INR 3.23 (*) 0.00 - 1.49   GLUCOSE, CAPILLARY     Status: Normal   Collection Time   10/07/12  7:48 AM      Component Value Range Comment   Glucose-Capillary 75  70 - 99 mg/dL   GLUCOSE, CAPILLARY     Status:  Abnormal   Collection Time   10/07/12 11:06 AM      Component Value Range Comment   Glucose-Capillary 193 (*) 70 - 99 mg/dL   GLUCOSE, CAPILLARY     Status: Abnormal   Collection Time   10/07/12  4:07 PM      Component Value Range Comment   Glucose-Capillary 148 (*) 70 - 99 mg/dL   GLUCOSE, CAPILLARY     Status: Abnormal   Collection Time   10/07/12  8:07 PM      Component Value Range Comment   Glucose-Capillary 233 (*) 70 - 99 mg/dL    Comment 1 Notify RN     GLUCOSE, CAPILLARY     Status: Abnormal   Collection Time   10/07/12 11:54 PM      Component Value Range Comment   Glucose-Capillary 514 (*) 70 - 99 mg/dL    Comment 1 Notify RN     GLUCOSE, RANDOM  Status: Abnormal   Collection Time   10/08/12 12:42 AM      Component Value Range Comment   Glucose, Bld 580 (*) 70 - 99 mg/dL   GLUCOSE, CAPILLARY     Status: Abnormal   Collection Time   10/08/12  2:43 AM      Component Value Range Comment   Glucose-Capillary 558 (*) 70 - 99 mg/dL    Comment 1 Notify RN     GLUCOSE, CAPILLARY     Status: Abnormal   Collection Time   10/08/12  5:01 AM      Component Value Range Comment   Glucose-Capillary 272 (*) 70 - 99 mg/dL    Comment 1 Notify RN     PROTIME-INR     Status: Abnormal   Collection Time   10/08/12  7:16 AM      Component Value Range Comment   Prothrombin Time 27.8 (*) 11.6 - 15.2 seconds    INR 2.76 (*) 0.00 - 1.49   GLUCOSE, CAPILLARY     Status: Abnormal   Collection Time   10/08/12  7:25 AM      Component Value Range Comment   Glucose-Capillary 45 (*) 70 - 99 mg/dL    Comment 1 Notify RN     GLUCOSE, CAPILLARY     Status: Normal   Collection Time   10/08/12  8:07 AM      Component Value Range Comment   Glucose-Capillary 82  70 - 99 mg/dL    Comment 1 Notify RN     GLUCOSE, CAPILLARY     Status: Abnormal   Collection Time   10/08/12  9:04 AM      Component Value Range Comment   Glucose-Capillary 109 (*) 70 - 99 mg/dL    Comment 1 Notify RN       GLUCOSE, CAPILLARY     Status: Abnormal   Collection Time   10/08/12 11:22 AM      Component Value Range Comment   Glucose-Capillary 178 (*) 70 - 99 mg/dL    Comment 1 Notify RN     GLUCOSE, CAPILLARY     Status: Abnormal   Collection Time   10/08/12  4:35 PM      Component Value Range Comment   Glucose-Capillary 229 (*) 70 - 99 mg/dL   GLUCOSE, CAPILLARY     Status: Abnormal   Collection Time   10/08/12  8:59 PM      Component Value Range Comment   Glucose-Capillary 251 (*) 70 - 99 mg/dL   GLUCOSE, CAPILLARY     Status: Abnormal   Collection Time   10/08/12 11:11 PM      Component Value Range Comment   Glucose-Capillary 294 (*) 70 - 99 mg/dL      HEENT: normal Cardio: irregular Resp: CTA B/L GI: BS positive Extremity:  Cyanosis Skin:   Breakdown R great toe tip ulcer Neuro: Lethargic, Abnormal Motor R side 3-/5 in UE and 3/5 in LE and Dysarthric Musc/Skel:  Other L BKA   Assessment/Plan: 1. Functional deficits secondary to L frontal infarct which require 3+ hours per day of interdisciplinary therapy in a comprehensive inpatient rehab setting. Physiatrist is providing close team supervision and 24 hour management of active medical problems listed below. Physiatrist and rehab team continue to assess barriers to discharge/monitor patient progress toward functional and medical goals. FIM: FIM - Bathing Bathing Steps Patient Completed: Chest;Right Arm;Abdomen;Front perineal area;Right upper leg;Left upper leg Bathing: 3: Mod-Patient completes 5-7  67f 10 parts or 50-74%  FIM - Upper Body Dressing/Undressing Upper body dressing/undressing steps patient completed: Thread/unthread right sleeve of pullover shirt/dresss;Thread/unthread left sleeve of pullover shirt/dress;Put head through opening of pull over shirt/dress;Pull shirt over trunk Upper body dressing/undressing: 4: Min-Patient completed 75 plus % of tasks FIM - Lower Body Dressing/Undressing Lower body  dressing/undressing steps patient completed: Thread/unthread left underwear leg;Thread/unthread left pants leg Lower body dressing/undressing: 2: Max-Patient completed 25-49% of tasks  FIM - Toileting Toileting steps completed by patient: Adjust clothing prior to toileting Toileting Assistive Devices: Grab bar or rail for support Toileting: 2: Max-Patient completed 1 of 3 steps  FIM - Diplomatic Services operational officer Devices: Grab bars Toilet Transfers: 2-To toilet/BSC: Max A (lift and lower assist);2-From toilet/BSC: Max A (lift and lower assist)  FIM - Press photographer Assistive Devices: Bed rails;Arm rests Bed/Chair Transfer: 4: Supine > Sit: Min A (steadying Pt. > 75%/lift 1 leg);5: Sit > Supine: Supervision (verbal cues/safety issues);4: Chair or W/C > Bed: Min A (steadying Pt. > 75%);3: Bed > Chair or W/C: Mod A (lift or lower assist)  FIM - Locomotion: Wheelchair Locomotion: Wheelchair: 2: Travels 50 - 149 ft with minimal assistance (Pt.>75%) FIM - Locomotion: Ambulation Locomotion: Ambulation Assistive Devices: Parallel bars Ambulation/Gait Assistance: 1: +2 Total assist Locomotion: Ambulation: 1: Two helpers  Comprehension Comprehension Mode: Auditory Comprehension: 5-Understands basic 90% of the time/requires cueing < 10% of the time  Expression Expression Mode: Verbal Expression: 5-Expresses basic 90% of the time/requires cueing < 10% of the time.  Social Interaction Social Interaction: 3-Interacts appropriately 50 - 74% of the time - May be physically or verbally inappropriate.  Problem Solving Problem Solving: 3-Solves basic 50 - 74% of the time/requires cueing 25 - 49% of the time  Memory Memory: 3-Recognizes or recalls 50 - 74% of the time/requires cueing 25 - 49% of the time  Medical Problem List and Plan:  1. Left frontal lobe embolic infarct/recent left below-knee amputation 07/22/2012  2. DVT Prophylaxis/Anticoagulation:  Chronic Coumadin therapy. Monitor for any bleeding episodes  3. Pain Management: Tylenol as needed. Monitor with increased mobility  4. Neuropsych: This patient is capable of making decisions on his/her own behalf.Home med was cymbalta will resume  5. Hypertension/atrial fibrillation. Cardiac rate controlled. Continue Norvasc 10 mg daily, Lopressor 75 mg twice a day and Demadex 10 mg twice a day. Monitor with increased activity  6. Diabetes mellitus. Hemoglobin A1c of 8.0. Continue Lantus insulin 18 units daily. Check blood sugars a.c. and at bedtime see discussion above 7. Mild compression fractures T7 and T10 as well his chronic compression fractures with cemented vertebral augmentation T11, T12 and L1. No plans for a further vertebral plasty at this time and followup as outpatient unable to do this currently since he would need to be off warfarin -advise the use of a soft lumbar-thoracic corset  -lidoderm patches  8. Left BKA- well shaped. Continue stump shrinker.  -prosthesis at some point after dc once improved from a stroke and back stand point.   LOS (Days) 6 A FACE TO FACE EVALUATION WAS PERFORMED  KIRSTEINS,ANDREW E 10/09/2012, 7:30 AM

## 2012-10-09 NOTE — Progress Notes (Signed)
I have read and agree with the following treatment session.  Trica Usery Hall, PT, DPT 

## 2012-10-09 NOTE — Progress Notes (Signed)
Physical Therapy Session Note  Patient Details  Name: Aaron [name] MRN: [id number] Date of Birth: [birth year]  Today's Date: 10/09/2012 Time: 1300-1400 Time Calculation (min): 60 min  Short Term Goals: Week 1:  PT Short Term Goal 1 (Week 1): Patient will perform bed mobility without use of rails and HOB flat with supervision. PT Short Term Goal 2 (Week 1): Patient will perform supine > sit transfer with supervision. PT Short Term Goal 3 (Week 1): Patient will perform sit to stand transfer with UE support and max assist PT Short Term Goal 4 (Week 1): Patient will propel w/c 50 feet with RLE and LUE with supervision  Therapy Documentation Precautions:  Precautions Precautions: Fall Precaution Comments: Lumbar corset to be worn OOB; LLE BKA, AFIB, maintain Sp02 >90% on 2L 02 assess vitals Required Braces or Orthoses: Spinal Brace Spinal Brace: Lumbar corset Restrictions Weight Bearing Restrictions: No   Vital Signs: Therapy Vitals Pulse Rate: 83  BP: 134/74 mmHg Patient Position, if appropriate: Sitting Oxygen Therapy SpO2: 99 % O2 Device: None (Room air) Pain: Pain Assessment Pain Assessment: No/denies pain    Other Treatments:   Patient sitting up in w/c upon PT arrival.  Patient propelled from room toward PT gym (~100 feet) with supervision and vcs for directions. Patient required assistance toward end of trip secondary to fatigue. Patient performed sit <> stand x 3 reps in parallel bars with min assist while performing standing dynamic balance exercises with emphasis on standing tolerance and dynamic balance. Patient able to stand for ~3 min on the first two stands, requiring min to mod assist to maintain balance while performing ball taps with RLE. Patient stood for ~1 min on last trial and required mod to max assist when performing ball taps with LUE secondary to significant posterior lean and inability to maintain balance independently. Patient required sitting rest break  in between sets secondary to decreased activity tolerance and fatigue.  Upon third rest break patient became very nauseated. RN was notified. Vitals were taken: BP: 134/74; HR: 83 bpm; Blood sugar: 257. RN suggested continuing therapy and she would bring patient medication.  Patient performed squat pivot transfer to R requiring mod assist. Performed L hip abduction in hooklying with trunk elevated on wedge x 15 reps with min vcs for form. Patient rolled on to R side with supervision. Patient performed L hip extension x 15 reps with min assist for keeping L hip in proper position, vcs for form.  Patient performed mat > w/c transfer to the L with min assist. Patient returned to room in w/c with call bell in reach.   See FIM for current functional status  Therapy/Group: Individual Therapy  Hancel Ion Hamilton DPT Student  10/09/2012, 2:08 PM

## 2012-10-09 NOTE — Progress Notes (Signed)
ANTICOAGULATION CONSULT NOTE - Follow Up Consult  Pharmacy Consult for coumadin  Indication: atrial fibrillation, secondary stroke prophylaxis  Patient Measurements: Height: 5\' 8"  (172.7 cm) Weight: 150 lb 9.2 oz (68.3 kg) IBW/kg (Calculated) : 68.4   Vital Signs: Temp: 97.7 F (36.5 C) (10/22 0652) Temp src: Oral (10/22 0652) BP: 146/76 mmHg (10/22 0652) Pulse Rate: 80  (10/22 0652)  Labs:  Basename 10/09/12 0710 10/08/12 0716 10/07/12 0605  HGB 10.9* -- --  HCT 34.2* -- --  PLT 325 -- --  APTT -- -- --  LABPROT 28.7* 27.8* 31.2*  INR 2.88* 2.76* 3.23*  HEPARINUNFRC -- -- --  CREATININE -- -- --  CKTOTAL -- -- --  CKMB -- -- --  TROPONINI -- -- --   Estimated Creatinine Clearance: 43 ml/min (by C-G formula based on Cr of 1.19).  Assessment: 78 y/o male patient with history of afib and stroke on coumadin PTA (2.5mg /day).  Patient was on Pradaxa but family prefers coumadin. INR today is therapeutic and trend up, will decrease dose  Goal of Therapy:  INR 2-3 Monitor platelets by anticoagulation protocol: Yes  Plan:  Coumadin 2mg  today and f/u INR in am.  Verlene Mayer, PharmD, BCPS Pager [phone]  10/09/2012 1:23 PM

## 2012-10-09 NOTE — Progress Notes (Signed)
Speech Language Pathology Daily Session Note  Patient Details  Name: Aaron [name] MRN: [id number] Date of Birth: 06-06-1924  Today's Date: 10/09/2012 Time: 4098-1191 Time Calculation (min): 35 min  Short Term Goals: Week 1: SLP Short Term Goal 1 (Week 1): Patient will identify two strategies to facilitate increased speech intelligibility with mod assist.   SLP Short Term Goal 2 (Week 1): Patient will utilize speech intelligibility strategies during conversation with mod assist visual and verbal cues.   SLP Short Term Goal 3 (Week 1): Patient will identify two strategies to facilitate increased recall of new information with mod assist.   SLP Short Term Goal 4 (Week 1): Patient will use external and internal memory aids to facilitate increased recall of new information with mod assist verbal cues.   SLP Short Term Goal 5 (Week 1): Patient will tolerate trials of thin liquids and regular textures without the use of a chin tuck with no overt s/s of aspiration with min assist for precautions.    Skilled Therapeutic Interventions: Treatment focus on speech and swallowing goals. Pt required Min verbal cues to utilize increased vocal intensity and slow rate to increase speech intelligibility at the conversation level and was ~90% intelligible. Pt participated in functional conversation and demonstrated some tangential language with repetition of biographical information, however, pt demonstrated anticipatory awareness with asking appropriate questions in regards to f/u therapy. Pt also consumed thin liquids and required supervision verbal cues to utilize a chin tuck.    FIM:  Comprehension Comprehension Mode: Auditory Comprehension: 5-Understands basic 90% of the time/requires cueing < 10% of the time Expression Expression Mode: Verbal Expression: 4-Expresses basic 75 - 89% of the time/requires cueing 10 - 24% of the time. Needs helper to occlude trach/needs to repeat words. Social  Interaction Social Interaction: 4-Interacts appropriately 75 - 89% of the time - Needs redirection for appropriate language or to initiate interaction. Problem Solving Problem Solving: 3-Solves basic 50 - 74% of the time/requires cueing 25 - 49% of the time Memory Memory: 3-Recognizes or recalls 50 - 74% of the time/requires cueing 25 - 49% of the time  Pain Pain Assessment Pain Assessment: No/denies pain  Therapy/Group: Individual Therapy  Rohin Krejci 10/09/2012, 4:02 PM

## 2012-10-09 NOTE — Progress Notes (Signed)
Occupational Therapy Session Note  Patient Details  Name: Aaron [name] MRN: [id number] Date of Birth: [birth year]  Today's Date: 10/09/2012 Time: 7846-9629 and 1400-1445 Time Calculation (min): 55 min and 45 min  Short Term Goals: Week 1:  OT Short Term Goal 1 (Week 1): Pt will complete LB bathing with min assist at sit to stand level OT Short Term Goal 2 (Week 1): Pt will complete LB dressing with mod assist OT Short Term Goal 3 (Week 1): Pt will complete toilet transfers with mod assist OT Short Term Goal 4 (Week 1): Pt will complete toileting (clothing management and hygiene) with mod assist  Skilled Therapeutic Interventions/Progress Updates:    1) Pt seen for ADL retraining with focus on bed mobility, stand pivot transfer, sit <> stand, and increased participation in self-care skills of bathing and dressing.  Pt awake and willing to participate in therapy this session.  Engaged in bed mobility of supine to sit with min assist and stand pivot transfer from bed to w/c on Lt with min assist using LUE to reach for arm rest.  Completed bathing at sit to stand level at sink with pt demonstrating increased independence with bathing with min cues for sequencing and to attempt LB bathing.  Min assist sit to stand and min-mod assist in standing for LB bathing and dressing.  Pt with increased involvement and motivation to participate in session.  2)  1:1 OT with focus on UB strengthening to assist with sit to stand and weight shifting to promote carryover to LB dressing and donning Rt shoe.  Completed 2 sets of 10 block pushups with support at Rt wrist secondary to decreased grasp.  Engaged in dynamic sitting balance with reaching in various planes to promote weight shifting and confidence in reaching towards Rt foot for when donning pants and shoes.  Pt's O2 remained >90% on room air with frequent monitoring.  Pt requested to return to bed at end of session.  Therapy Documentation Precautions:    Precautions Precautions: Fall Precaution Comments: Lumbar corset to be worn OOB; LLE BKA, AFIB, maintain Sp02 >90% on 2L 02 assess vitals Required Braces or Orthoses: Spinal Brace Spinal Brace: Lumbar corset Restrictions Weight Bearing Restrictions: No Pain:  Pt with no c/o pain this session.  See FIM for current functional status  Therapy/Group: Individual Therapy  [name] 10/09/2012, 1:59 PM

## 2012-10-10 ENCOUNTER — Inpatient Hospital Stay (HOSPITAL_COMMUNITY): Payer: Medicare Other | Admitting: Physical Therapy

## 2012-10-10 ENCOUNTER — Inpatient Hospital Stay (HOSPITAL_COMMUNITY): Payer: Medicare Other | Admitting: Speech Pathology

## 2012-10-10 ENCOUNTER — Inpatient Hospital Stay (HOSPITAL_COMMUNITY): Payer: Medicare Other | Admitting: Occupational Therapy

## 2012-10-10 LAB — GLUCOSE, CAPILLARY
Glucose-Capillary: 381 mg/dL — ABNORMAL HIGH (ref 70–99)
Glucose-Capillary: 89 mg/dL (ref 70–99)
Glucose-Capillary: 149 mg/dL — ABNORMAL HIGH (ref 70–99)

## 2012-10-10 LAB — PROTIME-INR: Prothrombin Time: 29.4 seconds — ABNORMAL HIGH (ref 11.6–15.2)

## 2012-10-10 MED ORDER — WARFARIN SODIUM 1 MG PO TABS
1.0000 mg | ORAL_TABLET | Freq: Once | ORAL | Status: AC
  Administered 2012-10-10: 1 mg via ORAL
  Filled 2012-10-10: qty 1

## 2012-10-10 MED ORDER — INSULIN GLARGINE 100 UNIT/ML ~~LOC~~ SOLN
23.0000 [IU] | Freq: Every day | SUBCUTANEOUS | Status: DC
  Administered 2012-10-11 – 2012-10-12 (×2): 23 [IU] via SUBCUTANEOUS

## 2012-10-10 MED ORDER — BACITRACIN-NEOMYCIN-POLYMYXIN OINTMENT TUBE
TOPICAL_OINTMENT | Freq: Two times a day (BID) | CUTANEOUS | Status: DC
  Administered 2012-10-10 – 2012-10-24 (×27): via TOPICAL
  Filled 2012-10-10 (×2): qty 15

## 2012-10-10 NOTE — Progress Notes (Signed)
The skilled treatment note has been reviewed and SLP is in agreement. Brynna Dobos, M.A., CCC-SLP [phone]  

## 2012-10-10 NOTE — Progress Notes (Signed)
ANTICOAGULATION CONSULT NOTE - Follow Up Consult  Pharmacy Consult for coumadin  Indication: atrial fibrillation, secondary stroke prophylaxis  Patient Measurements: Height: 5\' 8"  (172.7 cm) Weight: 150 lb 9.2 oz (68.3 kg) IBW/kg (Calculated) : 68.4   Vital Signs: Temp: 98.1 F (36.7 C) (10/23 0723) Temp src: Oral (10/23 0723) BP: 138/79 mmHg (10/23 0723) Pulse Rate: 77  (10/23 0723)  Labs:  Basename 10/10/12 0610 10/09/12 0710 10/08/12 0716  HGB -- 10.9* --  HCT -- 34.2* --  PLT -- 325 --  APTT -- -- --  LABPROT 29.4* 28.7* 27.8*  INR 2.98* 2.88* 2.76*  HEPARINUNFRC -- -- --  CREATININE -- -- --  CKTOTAL -- -- --  CKMB -- -- --  TROPONINI -- -- --   Estimated Creatinine Clearance: 43 ml/min (by C-G formula based on Cr of 1.19).  Assessment: 78 y/o male patient with history of afib and stroke on coumadin PTA (2.5mg /day).  Patient was on Pradaxa but family prefers coumadin. INR today is therapeutic and trend up, will decrease dose  Goal of Therapy:  INR 2-3 Monitor platelets by anticoagulation protocol: Yes  Plan:  Coumadin 1mg  today and f/u INR in am.  Verlene Mayer, PharmD, BCPS Pager [phone]  10/10/2012 2:04 PM

## 2012-10-10 NOTE — Progress Notes (Signed)
Physical Therapy Weekly Progress Note  Patient Details  Name: Aaron [name] MRN: [id number] Date of Birth: 27-Aug-1924  Today's Date: 10/10/2012 Time: 1300-1400 Time Calculation (min): 60 min  Patient has met 2 of 4 short term goals.  Patient progressing toward goals of supervision/min assist. Patient continues to require min A for bed mobility with HOB flat and no bedrails. Patient requires requires varying levels of assist from min to mod A for bed <> w/c transfers. Will continue to practice with patient.    Patient continues to demonstrate the following deficits: decreased strength, impaired postural control and balance in sitting and in standing, decreased activity tolerance/endurance, and significant fear of falling and therefore will continue to benefit from skilled PT intervention to enhance overall performance with activity tolerance, balance, postural control, ability to compensate for deficits, functional use of  left upper extremity, attention and awareness.  Patient progressing toward long term goals..  Continue plan of care.  PT Short Term Goals Week 1:  PT Short Term Goal 1 (Week 1): Patient will perform bed mobility without use of rails and HOB flat with supervision. PT Short Term Goal 1 - Progress (Week 1): Partly met PT Short Term Goal 2 (Week 1): Patient will perform supine > sit transfer with supervision. PT Short Term Goal 2 - Progress (Week 1): Partly met PT Short Term Goal 3 (Week 1): Patient will perform sit to stand transfer with UE support and max assist PT Short Term Goal 3 - Progress (Week 1): Met PT Short Term Goal 4 (Week 1): Patient will propel w/c 50 feet with RLE and LUE with supervision PT Short Term Goal 4 - Progress (Week 1): Met Week 2:  PT Short Term Goal 1 (Week 2): Patient will perform bed mobility with HOB flat, no bed rails, and supervision to simulate home environment PT Short Term Goal 2 (Week 2): Patient will perform supine > sit transfers with HOB  flat, no bed rails, and supervision PT Short Term Goal 3 (Week 2): Patient will perform squat pivot bed <> w/c transfer consistently with min A  PT Short Term Goal 4 (Week 2): Patient will demonstrate improvement in standing tolerance by standing for >5 min without rest break     Therapy Documentation Precautions:  Precautions Precautions: Fall Precaution Comments: Lumbar corset to be worn OOB; LLE BKA, AFIB, maintain Sp02 >90% on 2L 02 assess vitals Required Braces or Orthoses: Spinal Brace Spinal Brace: Lumbar corset Restrictions Weight Bearing Restrictions: No   Pain: Pain Assessment Pain Assessment: No/denies pain    Other Treatments:  Patient lying in bed upon PT arrival. Patient performed supine > sit transfer with min guard with HOB elevate and use of one bed rail. Patient able to scoot to EOB with min guard for safety. Patient performed stand-pivot  bed > w/c transfer with mod assist. Patient educated on how to don/doff w/c foot plates. Patient demonstrated and verbalized understanding. Patient propelled from room > PT gym (~185 feet) with supervision, requiring one rest break. Patient able to verbalize directions to gym. Patient performed squat-pivot transfer to L from w/c to mat requiring mod assist and max vcs for sequencing, technique, and safety. Patient performed anterior trunk leans from posterior lean position to reach for target with bilateral UEs with emphasis on core strengthening, encouraging anterior pelvic tilt, postural control, and sitting balance. Patient able to perform with mod vcs for form/technique.  Patient performed anterior trunk leans while pushing against therapist's shoulder and clearing bottom off of  mat with emphasis on postural control and shifting CoM forward to assist with ease of transfers. Patient presented with increased difficulty performing exercise secondary to inability to maintain erect posture with forward trunk lean (achieve appropriate  anterior tilt) and significant fear of falling by leaning forward over feet.  Patient performed forward trunk leans with BUEs holding on to cane. Patient instructed to tap high target with cane while maintaining posture. Patient became agitated with task and required rest break.  Patient performed sit to stands x 2 reps with UEs sliding forward on railing of stairs. Patient able to get anterior lean and stand with min guard and min vcs for initiation, sequencing. Patient performed squat-pivot transfer from mat > w/c with min A and vcs for sequencing. Patient returned to room in w/c and left with call bell in reach. Daughter in room with patient.   See FIM for current functional status  Therapy/Group: Individual Therapy  Zayanna Pundt Hamilton DPT Student 10/10/2012, 2:44 PM

## 2012-10-10 NOTE — Progress Notes (Signed)
Hypoglycemic Event  CBG: 66  Treatment: 15 GM carbohydrate snack  Symptoms: None  Follow-up CBG: Time:1715 CBG Result:89   Possible Reasons for Event: Unknown  Comments/MD notified:Pam Love, PAC    Carlean Purl  Remember to initiate Hypoglycemia Order Set & complete

## 2012-10-10 NOTE — Progress Notes (Signed)
Social Work Patient ID: Aaron [name], male   DOB: [birth year], 78 y.o.   MRN: [id number] Met with pt and spoke with daughter-Janice via telephone to inform team conference goals-supervision/min assist leveland discharge 11/6. Pt is discouraged due to felt was leaving very soon because therapists were talking about going home.  Daughter had questions regarding diabetes and  His hand.  Have asked OT-Sarah to contact daughter to address questions.  Daughter is agreeable to the plan and will talk with Dad.  Work on discharge plans And provide support to pt.

## 2012-10-10 NOTE — Progress Notes (Signed)
Inpatient Diabetes Program Recommendations  AACE/ADA: New Consensus Statement on Inpatient Glycemic Control (2013)  Target Ranges:  Prepandial:   less than 140 mg/dL      Peak postprandial:   less than 180 mg/dL (1-2 hours)      Critically ill patients:  140 - 180 mg/dL   Reason for Visit: Hyperglycemia  Has good appetite.  Eating well. Results for Aaron [name], Aaron [name] (MRN [id number]) as of 10/10/2012 10:06  Ref. Range 10/09/2012 07:24 10/09/2012 11:17 10/09/2012 13:44 10/09/2012 16:28 10/09/2012 21:28 10/10/2012 00:28 10/10/2012 05:15 10/10/2012 07:58  Glucose-Capillary Latest Range: 70-99 mg/dL 409 (H) 811 (H) 914 (H) 191 (H) 396 (H) 381 (H) 283 (H) 297 (H)   Recommendations:  Please add meal coverage insulin - Novolog 3 units tidwc if pt eats >50% meals.  Will continue to follow.

## 2012-10-10 NOTE — Progress Notes (Signed)
Patient ID: Aaron [name], male   DOB: June 08, 1924, 78 y.o.   MRN: [id number]   Subjective/Complaints: Pt slept ok RN notes R great toe was bleeding last noc  Review of Systems  Unable to perform ROS: mental acuity   Objective: Vital Signs: Blood pressure 138/79, pulse 77, temperature 98.1 F (36.7 C), temperature source Oral, resp. rate 17, height 5\' 8"  (1.727 m), weight 68.3 kg (150 lb 9.2 oz), SpO2 93.00%. No results found. Results for orders placed during the hospital encounter of 10/03/12 (from the past 72 hour(s))  GLUCOSE, CAPILLARY     Status: Abnormal   Collection Time   10/07/12 11:06 AM      Component Value Range Comment   Glucose-Capillary 193 (*) 70 - 99 mg/dL   GLUCOSE, CAPILLARY     Status: Abnormal   Collection Time   10/07/12  4:07 PM      Component Value Range Comment   Glucose-Capillary 148 (*) 70 - 99 mg/dL   GLUCOSE, CAPILLARY     Status: Abnormal   Collection Time   10/07/12  8:07 PM      Component Value Range Comment   Glucose-Capillary 233 (*) 70 - 99 mg/dL    Comment 1 Notify RN     GLUCOSE, CAPILLARY     Status: Abnormal   Collection Time   10/07/12 11:54 PM      Component Value Range Comment   Glucose-Capillary 514 (*) 70 - 99 mg/dL    Comment 1 Notify RN     GLUCOSE, RANDOM     Status: Abnormal   Collection Time   10/08/12 12:42 AM      Component Value Range Comment   Glucose, Bld 580 (*) 70 - 99 mg/dL   GLUCOSE, CAPILLARY     Status: Abnormal   Collection Time   10/08/12  2:43 AM      Component Value Range Comment   Glucose-Capillary 558 (*) 70 - 99 mg/dL    Comment 1 Notify RN     GLUCOSE, CAPILLARY     Status: Abnormal   Collection Time   10/08/12  5:01 AM      Component Value Range Comment   Glucose-Capillary 272 (*) 70 - 99 mg/dL    Comment 1 Notify RN     PROTIME-INR     Status: Abnormal   Collection Time   10/08/12  7:16 AM      Component Value Range Comment   Prothrombin Time 27.8 (*) 11.6 - 15.2 seconds    INR 2.76 (*) 0.00  - 1.49   GLUCOSE, CAPILLARY     Status: Abnormal   Collection Time   10/08/12  7:25 AM      Component Value Range Comment   Glucose-Capillary 45 (*) 70 - 99 mg/dL    Comment 1 Notify RN     GLUCOSE, CAPILLARY     Status: Normal   Collection Time   10/08/12  8:07 AM      Component Value Range Comment   Glucose-Capillary 82  70 - 99 mg/dL    Comment 1 Notify RN     GLUCOSE, CAPILLARY     Status: Abnormal   Collection Time   10/08/12  9:04 AM      Component Value Range Comment   Glucose-Capillary 109 (*) 70 - 99 mg/dL    Comment 1 Notify RN     GLUCOSE, CAPILLARY     Status: Abnormal   Collection Time   10/08/12 11:22  AM      Component Value Range Comment   Glucose-Capillary 178 (*) 70 - 99 mg/dL    Comment 1 Notify RN     GLUCOSE, CAPILLARY     Status: Abnormal   Collection Time   10/08/12  4:35 PM      Component Value Range Comment   Glucose-Capillary 229 (*) 70 - 99 mg/dL   GLUCOSE, CAPILLARY     Status: Abnormal   Collection Time   10/08/12  8:59 PM      Component Value Range Comment   Glucose-Capillary 251 (*) 70 - 99 mg/dL   GLUCOSE, CAPILLARY     Status: Abnormal   Collection Time   10/08/12 11:11 PM      Component Value Range Comment   Glucose-Capillary 294 (*) 70 - 99 mg/dL   PROTIME-INR     Status: Abnormal   Collection Time   10/09/12  7:10 AM      Component Value Range Comment   Prothrombin Time 28.7 (*) 11.6 - 15.2 seconds    INR 2.88 (*) 0.00 - 1.49   CBC     Status: Abnormal   Collection Time   10/09/12  7:10 AM      Component Value Range Comment   WBC 8.9  4.0 - 10.5 K/uL    RBC 3.89 (*) 4.22 - 5.81 MIL/uL    Hemoglobin 10.9 (*) 13.0 - 17.0 g/dL    HCT 16.1 (*) 09.6 - 52.0 %    MCV 87.9  78.0 - 100.0 fL    MCH 28.0  26.0 - 34.0 pg    MCHC 31.9  30.0 - 36.0 g/dL    RDW 04.5 (*) 40.9 - 15.5 %    Platelets 325  150 - 400 K/uL   GLUCOSE, CAPILLARY     Status: Abnormal   Collection Time   10/09/12  7:24 AM      Component Value Range Comment    Glucose-Capillary 343 (*) 70 - 99 mg/dL    Comment 1 Notify RN     GLUCOSE, CAPILLARY     Status: Abnormal   Collection Time   10/09/12 11:17 AM      Component Value Range Comment   Glucose-Capillary 258 (*) 70 - 99 mg/dL   GLUCOSE, CAPILLARY     Status: Abnormal   Collection Time   10/09/12  1:44 PM      Component Value Range Comment   Glucose-Capillary 257 (*) 70 - 99 mg/dL   GLUCOSE, CAPILLARY     Status: Abnormal   Collection Time   10/09/12  4:28 PM      Component Value Range Comment   Glucose-Capillary 191 (*) 70 - 99 mg/dL   GLUCOSE, CAPILLARY     Status: Abnormal   Collection Time   10/09/12  9:28 PM      Component Value Range Comment   Glucose-Capillary 396 (*) 70 - 99 mg/dL   GLUCOSE, CAPILLARY     Status: Abnormal   Collection Time   10/10/12 12:28 AM      Component Value Range Comment   Glucose-Capillary 381 (*) 70 - 99 mg/dL    Comment 1 Notify RN     GLUCOSE, CAPILLARY     Status: Abnormal   Collection Time   10/10/12  5:15 AM      Component Value Range Comment   Glucose-Capillary 283 (*) 70 - 99 mg/dL   PROTIME-INR     Status: Abnormal   Collection  Time   10/10/12  6:10 AM      Component Value Range Comment   Prothrombin Time 29.4 (*) 11.6 - 15.2 seconds    INR 2.98 (*) 0.00 - 1.49   GLUCOSE, CAPILLARY     Status: Abnormal   Collection Time   10/10/12  7:58 AM      Component Value Range Comment   Glucose-Capillary 297 (*) 70 - 99 mg/dL    Comment 1 Notify RN        HEENT: normal Cardio: irregular Resp: CTA B/L GI: BS positive Extremity:  Cyanosis Skin:   Breakdown R great toe tip ulcer, scab has been removed fresh granulation tissue without active bleeding Neuro: Lethargic, Abnormal Motor R side 3-/5 in UE and 3/5 in LE and Dysarthric Musc/Skel:  Other L BKA   Assessment/Plan: 1. Functional deficits secondary to L frontal infarct which require 3+ hours per day of interdisciplinary therapy in a comprehensive inpatient rehab setting. Physiatrist  is providing close team supervision and 24 hour management of active medical problems listed below. Physiatrist and rehab team continue to assess barriers to discharge/monitor patient progress toward functional and medical goals. FIM: FIM - Bathing Bathing Steps Patient Completed: Chest;Right Arm;Abdomen;Front perineal area;Right upper leg;Left upper leg;Right lower leg (including foot) Bathing: 3: Mod-Patient completes 5-7 53f 10 parts or 50-74%  FIM - Upper Body Dressing/Undressing Upper body dressing/undressing steps patient completed: Thread/unthread left sleeve of front closure shirt/dress Upper body dressing/undressing: 2: Max-Patient completed 25-49% of tasks FIM - Lower Body Dressing/Undressing Lower body dressing/undressing steps patient completed: Thread/unthread right underwear leg;Thread/unthread left underwear leg;Thread/unthread right pants leg;Thread/unthread left pants leg Lower body dressing/undressing: 2: Max-Patient completed 25-49% of tasks  FIM - Toileting Toileting steps completed by patient: Adjust clothing prior to toileting Toileting Assistive Devices: Grab bar or rail for support Toileting: 2: Max-Patient completed 1 of 3 steps  FIM - Diplomatic Services operational officer Devices: Grab bars Toilet Transfers: 2-To toilet/BSC: Max A (lift and lower assist);2-From toilet/BSC: Max A (lift and lower assist)  FIM - Press photographer Assistive Devices: Bed rails;Arm rests Bed/Chair Transfer: 4: Supine > Sit: Min A (steadying Pt. > 75%/lift 1 leg);5: Sit > Supine: Supervision (verbal cues/safety issues);3: Bed > Chair or W/C: Mod A (lift or lower assist);4: Chair or W/C > Bed: Min A (steadying Pt. > 75%)  FIM - Locomotion: Wheelchair Locomotion: Wheelchair: 2: Travels 50 - 149 ft with supervision, cueing or coaxing FIM - Locomotion: Ambulation Locomotion: Ambulation Assistive Devices: Parallel bars Ambulation/Gait Assistance: 1: +2 Total  assist Locomotion: Ambulation: 0: Activity did not occur  Comprehension Comprehension Mode: Auditory Comprehension: 5-Understands complex 90% of the time/Cues < 10% of the time  Expression Expression Mode: Verbal Expression: 4-Expresses basic 75 - 89% of the time/requires cueing 10 - 24% of the time. Needs helper to occlude trach/needs to repeat words.  Social Interaction Social Interaction: 4-Interacts appropriately 75 - 89% of the time - Needs redirection for appropriate language or to initiate interaction.  Problem Solving Problem Solving: 3-Solves basic 50 - 74% of the time/requires cueing 25 - 49% of the time  Memory Memory: 3-Recognizes or recalls 50 - 74% of the time/requires cueing 25 - 49% of the time  Medical Problem List and Plan:  1. Left frontal lobe embolic infarct/recent left below-knee amputation 07/22/2012  2. DVT Prophylaxis/Anticoagulation: Chronic Coumadin therapy. Monitor for any bleeding episodes  3. Pain Management: Tylenol as needed. Monitor with increased mobility  4. Neuropsych: This patient is  capable of making decisions on his/her own behalf.Home med was cymbalta will resume  5. Hypertension/atrial fibrillation. Cardiac rate controlled. Continue Norvasc 10 mg daily, Lopressor 75 mg twice a day and Demadex 10 mg twice a day. Monitor with increased activity  6. Diabetes mellitus. Hemoglobin A1c of 8.0. increase Lantus insulin 23 units daily. Check blood sugars a.c. and at bedtime see discussion above 7. Mild compression fractures T7 and T10 as well his chronic compression fractures with cemented vertebral augmentation T11, T12 and L1. No plans for a further vertebral plasty at this time and followup as outpatient unable to do this currently since he would need to be off warfarin -advise the use of a soft lumbar-thoracic corset  -lidoderm patches  8. Left BKA- well shaped. Continue stump shrinker.  -prosthesis at some point after dc once improved from a stroke  and back standpoint. 9.  Toe lesion will order neosporin ointment and drsg changes  LOS (Days) 7 A FACE TO FACE EVALUATION WAS PERFORMED  KIRSTEINS,ANDREW E 10/10/2012, 9:34 AM

## 2012-10-10 NOTE — Progress Notes (Signed)
Occupational Therapy Session Note  Patient Details  Name: Aaron [name] MRN: [id number] Date of Birth: 1924-08-03  Today's Date: 10/10/2012 Time: 1478-2956 Time Calculation (min): 45 min  Short Term Goals: Week 1:  OT Short Term Goal 1 (Week 1): Pt will complete LB bathing with min assist at sit to stand level OT Short Term Goal 2 (Week 1): Pt will complete LB dressing with mod assist OT Short Term Goal 3 (Week 1): Pt will complete toilet transfers with mod assist OT Short Term Goal 4 (Week 1): Pt will complete toileting (clothing management and hygiene) with mod assist  Skilled Therapeutic Interventions/Progress Updates:  Patient in w/c with right foot resting on bed and corset in place requesting to go to bed yet agree to participate in OT session.  Engaged in donning right sock and shoe with focus on adaptive technique with stool and issued shoe button to eliminate need to tie shoe.  Patient able to fasten and unfasten shoe laces with Mod I with shoe button in place.  Stand pivot transfer w/c>bed with max assist.  Scoot hips laterally to his left with vcs, 5 trails of sit>squat/stand ranging from max assist to min assist.  Patient able to demonstrate right finger flexion while holding onto armrest of chair.  Sit>supine with min assist once in supine to scoot in bed.  RUE exercises with focus distally.    Therapy Documentation Precautions:  Precautions Precautions: Fall Precaution Comments: Lumbar corset to be worn OOB; LLE BKA, AFIB, maintain Sp02 >90% on 2L 02 assess vitals Required Braces or Orthoses: Spinal Brace Spinal Brace: Lumbar corset Restrictions Weight Bearing Restrictions: No Pain: Denies pain  See FIM for current functional status  Therapy/Group: Individual Therapy  Nejla Reasor 10/10/2012, 12:32 PM

## 2012-10-10 NOTE — Progress Notes (Signed)
Speech Language Pathology Daily Session Note  Patient Details  Name: Aaron [name] MRN: [id number] Date of Birth: [birth year]  Today's Date: 10/10/2012 Time: 1191-4782 Time Calculation (min): 23 min  Short Term Goals: Week 1: SLP Short Term Goal 1 (Week 1): Patient will identify two strategies to facilitate increased speech intelligibility with mod assist.   SLP Short Term Goal 2 (Week 1): Patient will utilize speech intelligibility strategies during conversation with mod assist visual and verbal cues.   SLP Short Term Goal 3 (Week 1): Patient will identify two strategies to facilitate increased recall of new information with mod assist.   SLP Short Term Goal 4 (Week 1): Patient will use external and internal memory aids to facilitate increased recall of new information with mod assist verbal cues.   SLP Short Term Goal 5 (Week 1): Patient will tolerate trials of thin liquids and regular textures without the use of a chin tuck with no overt s/s of aspiration with min assist for precautions.    Skilled Therapeutic Interventions: Treatment addressed speech intelligibility.  SLP completed skilled observations to assess patient's intelligibility in conversation.  Patient exhibited improved speech intelligibility from previous sessions and utilized compensatory strategies with min assist verbal cues.  During trials of thin liquids without utilizing chin tuck as a safe swallowing strategy, patient exhibited no overt s/s of aspiration.  As a result, SLP recommends discontinuation of chin tuck when consuming thin liquids.  Overall, patient appears to be tolerating his current diet well.     FIM:  Comprehension Comprehension Mode: Auditory Comprehension: 5-Follows basic conversation/direction: With extra time/assistive device Expression Expression Mode: Verbal Expression: 5-Expresses basic 90% of the time/requires cueing < 10% of the time. Social Interaction Social Interaction: 5-Interacts  appropriately 90% of the time - Needs monitoring or encouragement for participation or interaction. Problem Solving Problem Solving: 3-Solves basic 50 - 74% of the time/requires cueing 25 - 49% of the time Memory Memory: 3-Recognizes or recalls 50 - 74% of the time/requires cueing 25 - 49% of the time FIM - Eating Eating Activity: 5: Supervision/cues  Pain Pain Assessment Pain Assessment: No/denies pain  Therapy/Group: Individual Therapy  Jackalyn Lombard, Conrad Fulton  Graduate Clinician Speech Language Pathology   Mckinnley Cottier, Joni Reining 10/10/2012, 4:58 PM

## 2012-10-10 NOTE — Progress Notes (Signed)
Occupational Therapy Session Note  Patient Details  Name: Hurtis Scimeca MRN: [id number] Date of Birth: 04/08/1924  Today's Date: 10/10/2012 Time: 4098-1191 Time Calculation (min): 53 min  Short Term Goals: Week 1:  OT Short Term Goal 1 (Week 1): Pt will complete LB bathing with min assist at sit to stand level OT Short Term Goal 2 (Week 1): Pt will complete LB dressing with mod assist OT Short Term Goal 3 (Week 1): Pt will complete toilet transfers with mod assist OT Short Term Goal 4 (Week 1): Pt will complete toileting (clothing management and hygiene) with mod assist  Skilled Therapeutic Interventions/Progress Updates:    Pt seen for ADL retraining with focus on bed mobility, stand pivot transfer, sit <> stand, and increased participation in self-care skills with bathing and dressing.  Pt finishing breakfast upon arrival and willing to get OOB and participate in therapy.  Completed bed mobility with supine to sitting EOB with use of bed rail and head of bed elevated.  Pt demonstrated increased confidence in sit to stand from bed with use of bed rail in Lt hand, however continues to require light mod assist (lifting) to stand. Engaged in stand pivot transfer with cues to use LUE to reach for w/c arm rest to assist with pivot.  Grooming conducted in sitting with verbal cues for pt to complete task, as he tends to request staff gather items for him.  Therapy Documentation Precautions:  Precautions Precautions: Fall Precaution Comments: Lumbar corset to be worn OOB; LLE BKA, AFIB, maintain Sp02 >90% on 2L 02 assess vitals Required Braces or Orthoses: Spinal Brace Spinal Brace: Lumbar corset Restrictions Weight Bearing Restrictions: No General:   Vital Signs: Therapy Vitals Temp: 98.1 F (36.7 C) Temp src: Oral Pulse Rate: 77  Resp: 17  BP: 138/79 mmHg Patient Position, if appropriate: Lying Oxygen Therapy SpO2: 93 % O2 Device: None (Room air) Pain:  Pt with no c/o pain this  session.  See FIM for current functional status  Therapy/Group: Individual Therapy  [name] 10/10/2012, 10:45 AM

## 2012-10-11 ENCOUNTER — Inpatient Hospital Stay (HOSPITAL_COMMUNITY): Payer: Medicare Other | Admitting: Speech Pathology

## 2012-10-11 ENCOUNTER — Inpatient Hospital Stay (HOSPITAL_COMMUNITY): Payer: Medicare Other | Admitting: *Deleted

## 2012-10-11 ENCOUNTER — Inpatient Hospital Stay (HOSPITAL_COMMUNITY): Payer: Medicare Other | Admitting: Occupational Therapy

## 2012-10-11 DIAGNOSIS — I69991 Dysphagia following unspecified cerebrovascular disease: Secondary | ICD-10-CM

## 2012-10-11 DIAGNOSIS — G8110 Spastic hemiplegia affecting unspecified side: Secondary | ICD-10-CM

## 2012-10-11 DIAGNOSIS — Z5189 Encounter for other specified aftercare: Secondary | ICD-10-CM

## 2012-10-11 DIAGNOSIS — I6330 Cerebral infarction due to thrombosis of unspecified cerebral artery: Secondary | ICD-10-CM

## 2012-10-11 LAB — GLUCOSE, CAPILLARY
Glucose-Capillary: 225 mg/dL — ABNORMAL HIGH (ref 70–99)
Glucose-Capillary: 284 mg/dL — ABNORMAL HIGH (ref 70–99)
Glucose-Capillary: 110 mg/dL — ABNORMAL HIGH (ref 70–99)

## 2012-10-11 MED ORDER — WARFARIN SODIUM 2 MG PO TABS
2.0000 mg | ORAL_TABLET | Freq: Once | ORAL | Status: AC
  Administered 2012-10-11: 2 mg via ORAL
  Filled 2012-10-11: qty 1

## 2012-10-11 NOTE — Patient Care Conference (Signed)
Inpatient RehabilitationTeam Conference Note Date: 10/10/2012   Time: 10:50 AM    Patient Name: Aaron [name]      Medical Record Number: [id number]  Date of Birth: September 21, 1924 Sex: Male         Room/Bed: 4035/4035-01 Payor Info: Payor: MEDICARE  Plan: MEDICARE PART A AND B  Product Type: *No Product type*     Admitting Diagnosis: LT CVA  Admit Date/Time:  10/03/2012  6:25 PM Admission Comments: No comment available   Primary Diagnosis:  CVA (cerebral infarction) Principal Problem: CVA (cerebral infarction)  Patient Active Problem List   Diagnosis Date Noted  . CVA (cerebral infarction) 10/04/2012  . Compression fracture of spine 10/02/2012  . Acute exacerbation of CHF (congestive heart failure) 10/01/2012  . NSTEMI (non-ST elevated myocardial infarction) 09/30/2012  . Back pain 09/27/2012  . Acute ischemic stroke 09/25/2012  . Hypertension 09/25/2012  . Diabetes mellitus 09/25/2012  . A-fib 09/25/2012  . S/P CABG x 1 09/25/2012  . Aortic valve replaced 09/25/2012    Expected Discharge Date: Expected Discharge Date: 10/24/12  Team Members Present: Physician: Dr. Claudette Laws Social Worker Present: Amada Jupiter, LCSW Nurse Present: Carlean Purl, RN PT Present: Edman Circle, PT OT Present: [name], Felipa Eth, OT SLP Present: Fae Pippin, SLP Other (Discipline and Name): Tora Duck, PPS Coordinator     Current Status/Progress Goal Weekly Team Focus  Medical   uncontrolled diabetes, recent BKA  maintain CBG in acceptable range  adjust meds   Bowel/Bladder   Continent of bowel and bladder  Continent of bowel and bladder  Monitor   Swallow/Nutrition/ Hydration   regular textures and thin liquids with intermittent supervision to utilize chin tuck  least restrictive p.o. intake  trials without chin tuck   ADL's   mod assist bathing, mod assist UB dressing, max assist LB dressing, min-mod assist stand pivot transfers (better when transferring to Lt)  min  assist overall, supervision grooming and UB dressing  increased participation, activity tolerance, sit <> stand, standing balance with self-care tasks   Mobility   Patient currenlty supervision to min guard for bed mobility; min to mod assist for squat pivot transfers; mod to max assist for gait using parallel bars; min to mod assist for standing; supervision for w/c propulsion  supervision to min assist for transfers/mobility  transfers; mobility    Communication   min assist   goal upgraded to supervision   increase self monitoring and correcting   Safety/Cognition/ Behavioral Observations  mod assist  min assist  increase use of external aids   Pain   no c/o of pain  no c/o of pain  Monitor   Skin   Bottom red   No skin issues   Apply barrier cream, Begin turn schedule to prevent further breakdown      *See Interdisciplinary Assessment and Plan and progress notes for long and short-term goals  Barriers to Discharge: only remaining lower limb is paretic from CVA    Possible Resolutions to Barriers:  WC level goals    Discharge Planning/Teaching Needs:  HOme with hired caregivers and daughter's very involved in his care.      Team Discussion:  Slow progress due to fear of falling, needs encouragement with participating in therapies.  Wants to go home soon.  Revisions to Treatment Plan:  NOne   Continued Need for Acute Rehabilitation Level of Care: The patient requires daily medical management by a physician with specialized training in physical medicine and rehabilitation for the  following conditions: Daily direction of a multidisciplinary physical rehabilitation program to ensure safe treatment while eliciting the highest outcome that is of practical value to the patient.: Yes Daily medical management of patient stability for increased activity during participation in an intensive rehabilitation regime.: Yes Daily analysis of laboratory values and/or radiology reports with any  subsequent need for medication adjustment of medical intervention for : Cardiac problems;Neurological problems;Other  [name], [name] 10/11/2012, 9:06 AM

## 2012-10-11 NOTE — Progress Notes (Signed)
Speech Language Pathology Daily Session Note  Patient Details  Name: Aaron [name] MRN: [id number] Date of Birth: [birth year]  Today's Date: 10/11/2012 Time: 1430-1500 Time Calculation (min): 30 min  Short Term Goals: Week 1: SLP Short Term Goal 1 (Week 1): Patient will identify two strategies to facilitate increased speech intelligibility with mod assist.   SLP Short Term Goal 2 (Week 1): Patient will utilize speech intelligibility strategies during conversation with mod assist visual and verbal cues.   SLP Short Term Goal 3 (Week 1): Patient will identify two strategies to facilitate increased recall of new information with mod assist.   SLP Short Term Goal 4 (Week 1): Patient will use external and internal memory aids to facilitate increased recall of new information with mod assist verbal cues.   SLP Short Term Goal 5 (Week 1): Patient will tolerate trials of thin liquids and regular textures without the use of a chin tuck with no overt s/s of aspiration with min assist for precautions.    Skilled Therapeutic Interventions: Treatment session addressed cognition and speech intelligibility.  SLP facilitated the session with mod assist visual and verbal cues to utilize speech intelligibility during a route finding task.   Of note, patient exhibited decreased speech intelligibility which SLP suspects was exacerbated by fatigue.  Patient utilized external aids to navigate between the first and the fourth floors of the hospital with min assist verbal and visual cues and increased wait time.  Patient also propelled his wheelchair with min assist verbal cues to ask for help when needed with increased fatigue.     FIM:  Comprehension Comprehension Mode: Auditory Comprehension: 5-Follows basic conversation/direction: With extra time/assistive device Expression Expression Mode: Verbal Expression: 4-Expresses basic 75 - 89% of the time/requires cueing 10 - 24% of the time. Needs helper to occlude  trach/needs to repeat words. Social Interaction Social Interaction: 5-Interacts appropriately 90% of the time - Needs monitoring or encouragement for participation or interaction. Problem Solving Problem Solving: 4-Solves basic 75 - 89% of the time/requires cueing 10 - 24% of the time Memory Memory: 3-Recognizes or recalls 50 - 74% of the time/requires cueing 25 - 49% of the time  Pain Pain Assessment Pain Assessment: No/denies pain  Therapy/Group: Individual Therapy  Jackalyn Lombard, [place]  Graduate Clinician Speech Language Pathology   Ruby Dilone, Joni Reining 10/11/2012, 4:25 PM

## 2012-10-11 NOTE — Progress Notes (Signed)
Speech Language Pathology Weekly Progress Note  Patient Details  Name: Aaron [name] MRN: [id number] Date of Birth: Aug 30, 1924  Today's Date: 10/11/2012  Short Term Goals: Week 1: SLP Short Term Goal 1 (Week 1): Patient will identify two strategies to facilitate increased speech intelligibility with mod assist.   SLP Short Term Goal 1 - Progress (Week 1):  Discontinued (comment) SLP Short Term Goal 2 (Week 1): Patient will utilize speech intelligibility strategies during conversation with mod assist visual and verbal cues.   SLP Short Term Goal 2 - Progress (Week 1): Met SLP Short Term Goal 3 (Week 1): Patient will identify two strategies to facilitate increased recall of new information with mod assist.   SLP Short Term Goal 3 - Progress (Week 1): Discontinued (comment) SLP Short Term Goal 4 (Week 1): Patient will use external and internal memory aids to facilitate increased recall of new information with mod assist verbal cues.   SLP Short Term Goal 4 - Progress (Week 1): Progressing toward goal SLP Short Term Goal 5 (Week 1): Patient will tolerate trials of thin liquids and regular textures without the use of a chin tuck with no overt s/s of aspiration with min assist for precautions.   SLP Short Term Goal 5 - Progress (Week 1): Met Week 2: SLP Short Term Goal 1 (Week 2): Patient will tolerate trials of thin liquids and regular textures without the use of a chin tuck with no overt s/s of aspiration with modified independence. SLP Short Term Goal 2 (Week 2): Patient will utilize speech intelligibility strategies during conversation with supervision level assist visual and verbal cues. SLP Short Term Goal 3 (Week 2): Patient will use external and internal memory aids to facilitate increased recall of new information with mod assist verbal cues.  Weekly Progress Updates:  Patient met 2 out of 5 short term objectives this reporting period with functional gains in speech intelligibility and  toleration of regular textures and thin liquids without use of a chin tuck. Patient did not met his other goal but has made functional gains in speech intelligibility without recall of taught strategies, which indicates overall improved coordination.  Additionally, patient has demonstrated fluctuating recall of daily information from day to day; however, SLP will continue to address during other therapy tasks.  As a result, this patient will continue to benefit from skilled SLP services to address above goals 3 times a week to maximize functional independence prior to discharge home with 24/7 caregiver.       SLP Frequency: 1-2 X/day, 30-60 minutes;3 out of 7 days Estimated Length of Stay: anticipated discharge 10/24/12 SLP Treatment/Interventions: Cognitive remediation/compensation;Cueing hierarchy;Dysphagia/aspiration precaution training;Environmental controls;Functional tasks;Internal/external aids;Patient/family education;Therapeutic Activities  Charlane Ferretti., CCC-SLP [phone]  [name] 10/12/2012, 9:45 AM

## 2012-10-11 NOTE — Progress Notes (Signed)
Physical Therapy Session Note  Patient Details  Name: Aaron [name] MRN: [id number] Date of Birth: 06-03-1924  Today's Date: 10/11/2012 Time: 0930-1015; 1300-1400 Time Calculation (min): 45 min; 60 min  Short Term Goals: Week 2:  PT Short Term Goal 1 (Week 2): Patient will perform bed mobility with HOB flat, no bed rails, and supervision to simulate home environment PT Short Term Goal 2 (Week 2): Patient will perform supine > sit transfers with HOB flat, no bed rails, and supervision PT Short Term Goal 3 (Week 2): Patient will perform squat pivot bed <> w/c transfer consistently with min A  PT Short Term Goal 4 (Week 2): Patient will demonstrate improvement in standing tolerance by standing for >5 min without rest break  Skilled Therapeutic Interventions/Progress Updates:      Therapy Documentation Precautions:  Precautions Precautions: Fall Precaution Comments: Lumbar corset to be worn OOB; LLE BKA, AFIB Required Braces or Orthoses: Spinal Brace Spinal Brace: Lumbar corset Restrictions Weight Bearing Restrictions: No General:   Vital Signs: Therapy Vitals Pulse Rate: 89  Oxygen Therapy SpO2: 99 % O2 Device: None (Room air) Pain: Pain Assessment Pain Assessment: No/denies pain   Other Treatments:   Patient sitting in w/c receiving medications when PT entered room. Patient propelled w/c using RLE and LUE from room > PT gym (~185 feet) with supervision and intermittent rest breaks secondary to decreased endurance. Patient able to verbalize correct directions to get to gym. Patient able to demonstrated ability to manage all parts of the w/c (brakes, foot rests, etc.) Patient performed w/c > mat transfer to the R, requiring Mod A secondary to having increased difficulty pivoting on RLE. Patient performed push ups using push up handles with min A at R hand with emphasis on obtaining appropriate position for squat-pivot transfers (anterior trunk lean, maintaining chest/head tall,  placing weight on ball of R foot, and maintaining squat position) and UE strengthening. Patient performed 10 reps x 3 sec hold for each. Patient c/o of some discomfort in back at last reps.  Patient performed sit > supine transfer with supervision and lumbar corset was readjusted/tightened. Patient reported feeling better after adjustment. Performed supine > sit transfer with min A for safety/steading.  Patient performed squat pivot transfers bilaterally from mat <> w/c, with min to mod A, with emphasis on anterior trunk lean, maintaining squat position, pivoting on RLE, and safety. Patient presented with increased difficulty transferring to R, requiring mod A for maintaining balance, lowering patient, and safety; Patient requires min A when transferring to L secondary to pivoting with ease on RLE. Will continue to practice to increase patient's safety when performing. Patient was pushed back to room by PT, left in w/c with call bell in reach.   PM Session: Patient sitting in w/c upon PT arrival. Patient propelled w/c with RLE and LUE from room > PT gym, ~180 feet with supervision. Patient required one rest break secondary to decreased activity tolerance.  Patient ambulated in parallel bars x 5 feet forward, 3 feet back with 2 helper assist for safety, maintaining balance, and assistance with RUE grasp. Patient unable to hop backward safely today secondary to UE fatigue, hence shorter distance. Patient required verbal encouragement throughout walk and verbal reminders that he had performed ambulation before in the parallel bars.  Patient taken to high/low mat and played a game of checkers. Attempted to stand while playing using RUE to move pieces initially with emphasis on standing tolerance and dynamic balance, however patient reports "my R arm  is dead" and unwilling to try. Tasked changed by having patient reach forward in sitting to move pieces with LUE with emphasis on postural control and anterior trunk  lean while maintaining chest and head elevated. Patient required vcs for posture and technique/form. PT attempted standing with patient once more in which patient performed with mod A and LUE support on table. Patient educated on moving pieces with RUE with compensatory strategy in which patient was able to perform with supervision to min A. Patient able to stand for ~4 minutes prior to needing seated rest. Patient was returned to room in w/c. Patient performed squat pivot w/c < > bed transfers x 2 reps requiring min to mod A and mod vcs for sequencing, technique, and safety. Patient very adamant this afternoon to perform transfers his way, unwilling to try therapist recommendations. Will continue to practice and progress as tolerated. Patient left in w/c with call bell in reach.   See FIM for current functional status  Therapy/Group: Individual Therapy  Alyria Krack [place] DPT Student  10/11/2012, 11:51 AM

## 2012-10-11 NOTE — Progress Notes (Signed)
Patient ID: Aaron [name], male   DOB: November 15, 1924, 78 y.o.   MRN: [id number]   Subjective/Complaints: Pt slept ok RN notes R great toe was bleeding last noc  Review of Systems  Unable to perform ROS: mental acuity   Objective: Vital Signs: Blood pressure 156/74, pulse 70, temperature 97.8 F (36.6 C), temperature source Oral, resp. rate 17, height 5\' 8"  (1.727 m), weight 68.3 kg (150 lb 9.2 oz), SpO2 96.00%. No results found. Results for orders placed during the hospital encounter of 10/03/12 (from the past 72 hour(s))  GLUCOSE, CAPILLARY     Status: Abnormal   Collection Time   10/08/12  9:04 AM      Component Value Range Comment   Glucose-Capillary 109 (*) 70 - 99 mg/dL    Comment 1 Notify RN     GLUCOSE, CAPILLARY     Status: Abnormal   Collection Time   10/08/12 11:22 AM      Component Value Range Comment   Glucose-Capillary 178 (*) 70 - 99 mg/dL    Comment 1 Notify RN     GLUCOSE, CAPILLARY     Status: Abnormal   Collection Time   10/08/12  4:35 PM      Component Value Range Comment   Glucose-Capillary 229 (*) 70 - 99 mg/dL   GLUCOSE, CAPILLARY     Status: Abnormal   Collection Time   10/08/12  8:59 PM      Component Value Range Comment   Glucose-Capillary 251 (*) 70 - 99 mg/dL   GLUCOSE, CAPILLARY     Status: Abnormal   Collection Time   10/08/12 11:11 PM      Component Value Range Comment   Glucose-Capillary 294 (*) 70 - 99 mg/dL   PROTIME-INR     Status: Abnormal   Collection Time   10/09/12  7:10 AM      Component Value Range Comment   Prothrombin Time 28.7 (*) 11.6 - 15.2 seconds    INR 2.88 (*) 0.00 - 1.49   CBC     Status: Abnormal   Collection Time   10/09/12  7:10 AM      Component Value Range Comment   WBC 8.9  4.0 - 10.5 K/uL    RBC 3.89 (*) 4.22 - 5.81 MIL/uL    Hemoglobin 10.9 (*) 13.0 - 17.0 g/dL    HCT 78.2 (*) 95.6 - 52.0 %    MCV 87.9  78.0 - 100.0 fL    MCH 28.0  26.0 - 34.0 pg    MCHC 31.9  30.0 - 36.0 g/dL    RDW 21.3 (*) 08.6 - 15.5 %     Platelets 325  150 - 400 K/uL   GLUCOSE, CAPILLARY     Status: Abnormal   Collection Time   10/09/12  7:24 AM      Component Value Range Comment   Glucose-Capillary 343 (*) 70 - 99 mg/dL    Comment 1 Notify RN     GLUCOSE, CAPILLARY     Status: Abnormal   Collection Time   10/09/12 11:17 AM      Component Value Range Comment   Glucose-Capillary 258 (*) 70 - 99 mg/dL   GLUCOSE, CAPILLARY     Status: Abnormal   Collection Time   10/09/12  1:44 PM      Component Value Range Comment   Glucose-Capillary 257 (*) 70 - 99 mg/dL   GLUCOSE, CAPILLARY     Status: Abnormal   Collection Time  10/09/12  4:28 PM      Component Value Range Comment   Glucose-Capillary 191 (*) 70 - 99 mg/dL   GLUCOSE, CAPILLARY     Status: Abnormal   Collection Time   10/09/12  9:28 PM      Component Value Range Comment   Glucose-Capillary 396 (*) 70 - 99 mg/dL   GLUCOSE, CAPILLARY     Status: Abnormal   Collection Time   10/10/12 12:28 AM      Component Value Range Comment   Glucose-Capillary 381 (*) 70 - 99 mg/dL    Comment 1 Notify RN     GLUCOSE, CAPILLARY     Status: Abnormal   Collection Time   10/10/12  5:15 AM      Component Value Range Comment   Glucose-Capillary 283 (*) 70 - 99 mg/dL   PROTIME-INR     Status: Abnormal   Collection Time   10/10/12  6:10 AM      Component Value Range Comment   Prothrombin Time 29.4 (*) 11.6 - 15.2 seconds    INR 2.98 (*) 0.00 - 1.49   GLUCOSE, CAPILLARY     Status: Abnormal   Collection Time   10/10/12  7:58 AM      Component Value Range Comment   Glucose-Capillary 297 (*) 70 - 99 mg/dL    Comment 1 Notify RN     GLUCOSE, CAPILLARY     Status: Abnormal   Collection Time   10/10/12 11:28 AM      Component Value Range Comment   Glucose-Capillary 326 (*) 70 - 99 mg/dL    Comment 1 Notify RN     GLUCOSE, CAPILLARY     Status: Abnormal   Collection Time   10/10/12  4:26 PM      Component Value Range Comment   Glucose-Capillary 66 (*) 70 - 99 mg/dL      Comment 1 Notify RN     GLUCOSE, CAPILLARY     Status: Normal   Collection Time   10/10/12  5:02 PM      Component Value Range Comment   Glucose-Capillary 89  70 - 99 mg/dL   GLUCOSE, CAPILLARY     Status: Abnormal   Collection Time   10/10/12  8:02 PM      Component Value Range Comment   Glucose-Capillary 149 (*) 70 - 99 mg/dL   GLUCOSE, CAPILLARY     Status: Abnormal   Collection Time   10/10/12 11:57 PM      Component Value Range Comment   Glucose-Capillary 247 (*) 70 - 99 mg/dL    Comment 1 Notify RN     GLUCOSE, CAPILLARY     Status: Abnormal   Collection Time   10/11/12  4:12 AM      Component Value Range Comment   Glucose-Capillary 225 (*) 70 - 99 mg/dL    Comment 1 Notify RN     PROTIME-INR     Status: Abnormal   Collection Time   10/11/12  5:40 AM      Component Value Range Comment   Prothrombin Time 26.4 (*) 11.6 - 15.2 seconds    INR 2.58 (*) 0.00 - 1.49   GLUCOSE, CAPILLARY     Status: Abnormal   Collection Time   10/11/12  7:38 AM      Component Value Range Comment   Glucose-Capillary 247 (*) 70 - 99 mg/dL    Comment 1 Notify RN  HEENT: normal Cardio: irregular Resp: CTA B/L GI: BS positive Extremity:  Cyanosis Skin:   Breakdown R great toe tip ulcer, scab has been removed fresh granulation tissue without active bleeding Neuro: Lethargic, Abnormal Motor R side 3-/5 in UE and 3/5 in LE and Dysarthric Musc/Skel:  Other L BKA   Assessment/Plan: 1. Functional deficits secondary to L frontal infarct which require 3+ hours per day of interdisciplinary therapy in a comprehensive inpatient rehab setting. Physiatrist is providing close team supervision and 24 hour management of active medical problems listed below. Physiatrist and rehab team continue to assess barriers to discharge/monitor patient progress toward functional and medical goals. FIM: FIM - Bathing Bathing Steps Patient Completed: Chest;Right Arm;Abdomen;Front perineal area;Right upper  leg;Left upper leg;Right lower leg (including foot) Bathing: 3: Mod-Patient completes 5-7 7f 10 parts or 50-74%  FIM - Upper Body Dressing/Undressing Upper body dressing/undressing steps patient completed: Thread/unthread right sleeve of pullover shirt/dresss;Thread/unthread left sleeve of pullover shirt/dress;Put head through opening of pull over shirt/dress;Pull shirt over trunk Upper body dressing/undressing: 4: Min-Patient completed 75 plus % of tasks FIM - Lower Body Dressing/Undressing Lower body dressing/undressing steps patient completed: Thread/unthread right underwear leg;Thread/unthread left underwear leg;Pull underwear up/down;Thread/unthread left pants leg Lower body dressing/undressing: 3: Mod-Patient completed 50-74% of tasks  FIM - Toileting Toileting steps completed by patient: Adjust clothing prior to toileting Toileting Assistive Devices: Grab bar or rail for support Toileting: 2: Max-Patient completed 1 of 3 steps  FIM - Diplomatic Services operational officer Devices: Grab bars Toilet Transfers: 2-To toilet/BSC: Max A (lift and lower assist);2-From toilet/BSC: Max A (lift and lower assist)  FIM - Press photographer Assistive Devices: HOB elevated;Bed rails;Arm rests Bed/Chair Transfer: 5: Supine > Sit: Supervision (verbal cues/safety issues);3: Bed > Chair or W/C: Mod A (lift or lower assist);3: Chair or W/C > Bed: Mod A (lift or lower assist)  FIM - Locomotion: Wheelchair Locomotion: Wheelchair: 5: Travels 150 ft or more: maneuvers on rugs and over door sills with supervision, cueing or coaxing FIM - Locomotion: Ambulation Locomotion: Ambulation Assistive Devices: Parallel bars Ambulation/Gait Assistance: 1: +2 Total assist Locomotion: Ambulation: 0: Activity did not occur  Comprehension Comprehension Mode: Auditory Comprehension: 5-Follows basic conversation/direction: With extra time/assistive device  Expression Expression Mode:  Verbal Expression: 5-Expresses basic 90% of the time/requires cueing < 10% of the time.  Social Interaction Social Interaction: 5-Interacts appropriately 90% of the time - Needs monitoring or encouragement for participation or interaction.  Problem Solving Problem Solving: 3-Solves basic 50 - 74% of the time/requires cueing 25 - 49% of the time  Memory Memory: 3-Recognizes or recalls 50 - 74% of the time/requires cueing 25 - 49% of the time  Medical Problem List and Plan:  1. Left frontal lobe embolic infarct/recent left below-knee amputation 07/22/2012  2. DVT Prophylaxis/Anticoagulation: Chronic Coumadin therapy. Monitor for any bleeding episodes  3. Pain Management: Tylenol as needed. Monitor with increased mobility  4. Neuropsych: This patient is capable of making decisions on his/her own behalf.Home med was cymbalta will resume  5. Hypertension/atrial fibrillation. Cardiac rate controlled. Continue Norvasc 10 mg daily, Lopressor 75 mg twice a day and Demadex 10 mg twice a day. Monitor with increased activity  6. Diabetes mellitus. Hemoglobin A1c of 8.0. increase Lantus insulin 23 units daily. Check blood sugars a.c. and at bedtime see discussion above 7. Mild compression fractures T7 and T10 as well his chronic compression fractures with cemented vertebral augmentation T11, T12 and L1. No plans for a further vertebral  plasty at this time and followup as outpatient unable to do this currently since he would need to be off warfarin -advise the use of a soft lumbar-thoracic corset  -lidoderm patches  8. Left BKA- well shaped. Continue stump shrinker.  -prosthesis at some point after dc once improved from a stroke and back standpoint. 9.  Toe lesion will order neosporin ointment and drsg changes, no further bleeding  LOS (Days) 8 A FACE TO FACE EVALUATION WAS PERFORMED  KIRSTEINS,ANDREW E 10/11/2012, 8:15 AM

## 2012-10-11 NOTE — Progress Notes (Signed)
ANTICOAGULATION CONSULT NOTE - Follow Up Consult  Pharmacy Consult for coumadin  Indication: atrial fibrillation, secondary stroke prophylaxis  Patient Measurements: Height: 5\' 8"  (172.7 cm) Weight: 150 lb 9.2 oz (68.3 kg) IBW/kg (Calculated) : 68.4   Vital Signs: Temp: 97.8 F (36.6 C) (10/24 0552) Temp src: Oral (10/24 0552) BP: 156/74 mmHg (10/24 0552) Pulse Rate: 70  (10/24 0552)  Labs:  Basename 10/11/12 0540 10/10/12 0610 10/09/12 0710  HGB -- -- 10.9*  HCT -- -- 34.2*  PLT -- -- 325  APTT -- -- --  LABPROT 26.4* 29.4* 28.7*  INR 2.58* 2.98* 2.88*  HEPARINUNFRC -- -- --  CREATININE -- -- --  CKTOTAL -- -- --  CKMB -- -- --  TROPONINI -- -- --   Estimated Creatinine Clearance: 43 ml/min (by C-G formula based on Cr of 1.19).  Assessment: 78 y/o male patient with history of afib and stroke on coumadin PTA (2.5mg /day).  Patient was on Pradaxa but family prefers coumadin. INR today is therapeutic and trend down after dose decrease. Will try to stabilize INR.  Goal of Therapy:  INR 2-3 Monitor platelets by anticoagulation protocol: Yes  Plan:  Coumadin 2mg  today and f/u INR in am.  Verlene Mayer, PharmD, BCPS Pager [phone]  10/11/2012 11:04 AM

## 2012-10-11 NOTE — Progress Notes (Signed)
I have read and agree with treatment note.  Michale Emmerich, PT  

## 2012-10-11 NOTE — Progress Notes (Signed)
The skilled treatment note has been reviewed and SLP is in agreement. Clarise Chacko, M.A., CCC-SLP [phone]  

## 2012-10-11 NOTE — Progress Notes (Signed)
Occupational Therapy Weekly Progress Note and Treatment Session Note  Patient Details  Name: Aaron [name] MRN: [id number] Date of Birth: March 25, 1924  Today's Date: 10/11/2012 Time: 4098-1191 Time Calculation (min): 55 min  Patient has met 2 of 4 short term goals.  Patient progressing towards overall min assist goals.  Pt demonstrating mod assist stand pivot transfers, fluctuating depending on motivation.  Pt min assist overall with UB bathing and dressing and mod-max assist with LB secondary to fearfulness of falling.  Pt continues to have decreased functional use of Rt hand and will persist on deficits.  Patient continues to demonstrate the following deficits: decreased strength, impaired postural control and balance in sitting and standing, decreased activity tolerance/endurance, impaired RUE with functional tasks, and significant fear of falling and therefore will continue to benefit from skilled OT intervention to enhance overall performance with BADL and Reduce care partner burden.  Patient progressing toward long term goals..  Continue plan of care.  OT Short Term Goals Week 1:  OT Short Term Goal 1 (Week 1): Pt will complete LB bathing with min assist at sit to stand level OT Short Term Goal 1 - Progress (Week 1): Progressing toward goal OT Short Term Goal 2 (Week 1): Pt will complete LB dressing with mod assist OT Short Term Goal 2 - Progress (Week 1): Met OT Short Term Goal 3 (Week 1): Pt will complete toilet transfers with mod assist OT Short Term Goal 3 - Progress (Week 1): Met OT Short Term Goal 4 (Week 1): Pt will complete toileting (clothing management and hygiene) with mod assist OT Short Term Goal 4 - Progress (Week 1): Progressing toward goal Week 2:  OT Short Term Goal 1 (Week 2): Pt will complete LB bathing with min assist at sit to stand level  OT Short Term Goal 2 (Week 2): Pt will complete toileting (clothing management and hygiene) with mod assist  OT Short Term Goal  3 (Week 2): Pt will complete toilet transfer with min assist OT Short Term Goal 4 (Week 2): Pt will integrate RUE into self-care tasks at gross assist with min cues  Skilled Therapeutic Interventions/Progress Updates:    Pt seen for ADL retraining with focus on transfers, sit <> stand, dynamic standing balance, and forced use of RUE in self-care tasks.  Pt seated at EOB finishing breakfast upon arrival, min cues for pt to improve sitting posture at EOB.  Pt reported needing to have BM, stand pivot transfer with mod assist from bed > w/c > toilet. Pt with increased spontaneous use of RUE with reaching for grab bar (with gross grasp) to assist in transfer.  Pt with improved sit <> stand with BUE use of w/c arm rests and mod assist for weight shifting.  Pt donned Rt sock with backward chaining.  Passive stretch to Rt hand with noted Dupuytren's contracture in last finger, but able to elicit flexion with increased encouragement in other 3 fingers.  Therapy Documentation Precautions:  Precautions Precautions: Fall Precaution Comments: Lumbar corset to be worn OOB; LLE BKA, AFIB Required Braces or Orthoses: Spinal Brace Spinal Brace: Lumbar corset Restrictions Weight Bearing Restrictions: No General:   Vital Signs: Therapy Vitals Temp: 97.8 F (36.6 C) Temp src: Oral Pulse Rate: 70  Resp: 17  BP: 156/74 mmHg Patient Position, if appropriate: Lying Oxygen Therapy SpO2: 96 % O2 Device: None (Room air) Pain: Pain Assessment Pain Assessment: No/denies pain ADL: ADL Eating: Set up Where Assessed-Eating: Edge of bed Grooming: Setup;Minimal assistance Where Assessed-Grooming:  Sitting at sink Upper Body Bathing: Minimal assistance Where Assessed-Upper Body Bathing: Sitting at sink Lower Body Bathing: Moderate assistance Where Assessed-Lower Body Bathing: Sitting at sink;Standing at sink Upper Body Dressing: Minimal assistance Where Assessed-Upper Body Dressing: Sitting at sink Lower  Body Dressing: Moderate assistance Where Assessed-Lower Body Dressing: Standing at sink;Sitting at sink Toileting: Dependent Where Assessed-Toileting: Teacher, adult education: Moderate assistance Toilet Transfer Method: Stand pivot Toilet Transfer Equipment: Grab bars  See FIM for current functional status  Therapy/Group: Individual Therapy  [name] 10/11/2012, 9:33 AM

## 2012-10-12 ENCOUNTER — Inpatient Hospital Stay (HOSPITAL_COMMUNITY): Payer: Medicare Other | Admitting: Physical Therapy

## 2012-10-12 ENCOUNTER — Inpatient Hospital Stay (HOSPITAL_COMMUNITY): Payer: Medicare Other | Admitting: Occupational Therapy

## 2012-10-12 ENCOUNTER — Inpatient Hospital Stay (HOSPITAL_COMMUNITY): Payer: Medicare Other | Admitting: Speech Pathology

## 2012-10-12 DIAGNOSIS — I69991 Dysphagia following unspecified cerebrovascular disease: Secondary | ICD-10-CM

## 2012-10-12 DIAGNOSIS — G8110 Spastic hemiplegia affecting unspecified side: Secondary | ICD-10-CM

## 2012-10-12 DIAGNOSIS — I6330 Cerebral infarction due to thrombosis of unspecified cerebral artery: Secondary | ICD-10-CM

## 2012-10-12 LAB — GLUCOSE, CAPILLARY
Glucose-Capillary: 233 mg/dL — ABNORMAL HIGH (ref 70–99)
Glucose-Capillary: 247 mg/dL — ABNORMAL HIGH (ref 70–99)

## 2012-10-12 LAB — PROTIME-INR: INR: 2.38 — ABNORMAL HIGH (ref 0.00–1.49)

## 2012-10-12 MED ORDER — WARFARIN SODIUM 2 MG PO TABS
2.0000 mg | ORAL_TABLET | Freq: Once | ORAL | Status: AC
  Administered 2012-10-12: 2 mg via ORAL
  Filled 2012-10-12: qty 1

## 2012-10-12 MED ORDER — INSULIN GLARGINE 100 UNIT/ML ~~LOC~~ SOLN
25.0000 [IU] | Freq: Every day | SUBCUTANEOUS | Status: DC
  Administered 2012-10-13 – 2012-10-14 (×2): 25 [IU] via SUBCUTANEOUS

## 2012-10-12 NOTE — Progress Notes (Signed)
Physical Therapy Session Note  Patient Details  Name: Aaron [name] MRN: [id number] Date of Birth: 1924/02/10  Today's Date: 10/12/2012 Time: 1005-1100 Time Calculation (min): 55 min  Short Term Goals: Week 2:  PT Short Term Goal 1 (Week 2): Patient will perform bed mobility with HOB flat, no bed rails, and supervision to simulate home environment PT Short Term Goal 2 (Week 2): Patient will perform supine > sit transfers with HOB flat, no bed rails, and supervision PT Short Term Goal 3 (Week 2): Patient will perform squat pivot bed <> w/c transfer consistently with min A  PT Short Term Goal 4 (Week 2): Patient will demonstrate improvement in standing tolerance by standing for >5 min without rest break  Skilled Therapeutic Interventions/Progress Updates:   Patient performed w/c > mat with squat pivot with min-mod A for RUE placement, trunk elongation during anterior lean to bring COG over BOS to allow for more efficient sit > squat and to maintain squat position during pivot; patient also required max-total encouragement and verbal cues for sequence; patient appears discouraged this am and continues to state that he can't do something because his RUE is "dead".  Performed sit > stand multiple times from mat with mod A and static standing x 2 reps with mod A overall to maintain R hand position on RW, 2nd repetition with R hand splint to maintain position to allow therapist to be on L side to facilitate with tactile and verbal cues for anterior and L lateral weight shifts and maintaining to prevent posterior and R LOB during horseshoe tossing with LUE.  Performed lateral hopping to L and R with mod A overall to maintain RUE hand position and frequent rest breaks secondary to fatigue and SOB.  Sp02: 92%, 90 bpm.  Performed mat > w/c with stand pivot with RW and mod A for balance, and verbal cues for safety and sequencing with RW.  Patient still very fearful of using RW secondary to falls at home but is  also very frustrated because no one has come to fit him for prosthetic LE.    Therapy Documentation Precautions:  Precautions Precautions: Fall Precaution Comments: Lumbar corset to be worn OOB; LLE BKA, AFIB Required Braces or Orthoses: Spinal Brace Spinal Brace: Lumbar corset Restrictions Weight Bearing Restrictions: No Pain: Pain Assessment Pain Assessment: No/denies pain  See FIM for current functional status  Therapy/Group: Individual Therapy  [facility] 10/12/2012, 10:38 AM

## 2012-10-12 NOTE — Progress Notes (Signed)
I have read and agree with the following treatment session.  Shamyra Farias Hall, PT, DPT 

## 2012-10-12 NOTE — Progress Notes (Signed)
Nutrition Brief Note  Patient identified on the Malnutrition Screening Tool (MST) Report. Pt reports that his usual weight is between 150 - 160 lb. This is consistent with his current weight. Pt is eating well at this time. Pt likely meeting estimated needs.  Body mass index is 22.89 kg/(m^2). Pt meets criteria for normal weight based on current BMI.   Current diet order is Carbohydrate Modified Medium, patient is consuming approximately 100% of meals at this time. Labs and medications reviewed.   No nutrition interventions warranted at this time. If nutrition issues arise, please consult RD.   Aaron Motto MS, RD, LDN Pager: [phone] After-hours pager: [phone]

## 2012-10-12 NOTE — Progress Notes (Signed)
ANTICOAGULATION CONSULT NOTE - Follow Up Consult  Pharmacy Consult for coumadin  Indication: atrial fibrillation, secondary stroke prophylaxis  Patient Measurements: Height: 5\' 8"  (172.7 cm) Weight: 150 lb 9.2 oz (68.3 kg) IBW/kg (Calculated) : 68.4   Vital Signs: Temp: 97.3 F (36.3 C) (10/25 0500) Temp src: Oral (10/25 0500) BP: 139/67 mmHg (10/25 0500) Pulse Rate: 69  (10/25 0500)  Labs:  Basename 10/12/12 0630 10/11/12 0540 10/10/12 0610  HGB -- -- --  HCT -- -- --  PLT -- -- --  APTT -- -- --  LABPROT 24.9* 26.4* 29.4*  INR 2.38* 2.58* 2.98*  HEPARINUNFRC -- -- --  CREATININE -- -- --  CKTOTAL -- -- --  CKMB -- -- --  TROPONINI -- -- --   Estimated Creatinine Clearance: 43 ml/min (by C-G formula based on Cr of 1.19).  Assessment: 78 y/o male patient with history of afib and stroke on coumadin PTA (2.5mg /day).  Patient was on Pradaxa but family prefers coumadin. INR today is therapeutic and trend down. Will try to stabilize INR.  Goal of Therapy:  INR 2-3 Monitor platelets by anticoagulation protocol: Yes  Plan:  Repeat Coumadin 2mg  today and f/u INR in am.  Verlene Mayer, PharmD, BCPS Pager [phone]  10/12/2012 12:27 PM

## 2012-10-12 NOTE — Progress Notes (Signed)
Occupational Therapy Session Note  Patient Details  Name: Aaron [name] MRN: [id number] Date of Birth: [birth year]  Today's Date: 10/12/2012 Time: 0818-0900 and 7829-5621 Time Calculation (min): 42 min and 60 min  Short Term Goals: Week 2:  OT Short Term Goal 1 (Week 2): Pt will complete LB bathing with min assist at sit to stand level  OT Short Term Goal 2 (Week 2): Pt will complete toileting (clothing management and hygiene) with mod assist  OT Short Term Goal 3 (Week 2): Pt will complete toilet transfer with min assist OT Short Term Goal 4 (Week 2): Pt will integrate RUE into self-care tasks at gross assist with min cues  Skilled Therapeutic Interventions/Progress Updates:    1) Pt seen for ADL retraining with focus on stand pivot transfers, sit <> stand, standing balance, weight shifting for LB dressing and donning shoes.  Pt completed bathing at sink level with hand over hand assist to wash LUE, pt reports "Rt hand is dead" requiring max encouragement to attempt use.  Pt with gross grasp on wash cloth and required light tactile cues for washing.  Pt with improved sit to stand with use of w/c arm rest and ability to come up to standing with min assist and mod assist to maintain standing balance while completing LB dressing task of pulling pants over hips.  Pt continues to be hesitant to release either UE from sink to assist with pulling up pants, secondary to heightened fear of falling.  2) 1:1 OT with focus on activity tolerance/endurance, sit <> stand, and static standing with and without UE support.  Pt's daughter, Aaron [name], present upon arrival. She had multiple questions about adaptations to assist with increased independence with bathing and dressing.  Educated daughter and pt on encouraging pt to incorporate RUE into any and all tasks (even gross grasp) to increase functional use incorporation in tasks.  Pt completed stand pivot transfer from bed to w/c to Lt with min assist.  Attempted to  engage in pipe tree activity in standing to increase standing tolerance while using LUE in task (to simulate standing to wash periarea or pull up pants).  Pt with increased sit to stand, requiring only min assist, however requires increased assist to continue standing while completing activity.  Pt adamant about not being able to complete activity in standing, however able to release grip on table to reach in various planes for items with mod assist to maintain standing balance.  Pt propelled w/c back to room and requested to return to bed.  Therapy Documentation Precautions:  Precautions Precautions: Fall Precaution Comments: Lumbar corset to be worn OOB; LLE BKA, AFIB Required Braces or Orthoses: Spinal Brace Spinal Brace: Lumbar corset Restrictions Weight Bearing Restrictions: No Pain: Pain Assessment Pain Assessment: No/denies pain  See FIM for current functional status  Therapy/Group: Individual Therapy  [name] 10/12/2012, 12:08 PM

## 2012-10-12 NOTE — Progress Notes (Signed)
Patient ID: Aaron [name], male   DOB: 04-13-1924, 78 y.o.   MRN: [id number]   Subjective/Complaints: No pain c/os Review of Systems  Unable to perform ROS: mental acuity   Objective: Vital Signs: Blood pressure 139/67, pulse 69, temperature 97.3 F (36.3 C), temperature source Oral, resp. rate 19, height 5\' 8"  (1.727 m), weight 68.3 kg (150 lb 9.2 oz), SpO2 97.00%. No results found. Results for orders placed during the hospital encounter of 10/03/12 (from the past 72 hour(s))  GLUCOSE, CAPILLARY     Status: Abnormal   Collection Time   10/09/12 11:17 AM      Component Value Range Comment   Glucose-Capillary 258 (*) 70 - 99 mg/dL   GLUCOSE, CAPILLARY     Status: Abnormal   Collection Time   10/09/12  1:44 PM      Component Value Range Comment   Glucose-Capillary 257 (*) 70 - 99 mg/dL   GLUCOSE, CAPILLARY     Status: Abnormal   Collection Time   10/09/12  4:28 PM      Component Value Range Comment   Glucose-Capillary 191 (*) 70 - 99 mg/dL   GLUCOSE, CAPILLARY     Status: Abnormal   Collection Time   10/09/12  9:28 PM      Component Value Range Comment   Glucose-Capillary 396 (*) 70 - 99 mg/dL   GLUCOSE, CAPILLARY     Status: Abnormal   Collection Time   10/10/12 12:28 AM      Component Value Range Comment   Glucose-Capillary 381 (*) 70 - 99 mg/dL    Comment 1 Notify RN     GLUCOSE, CAPILLARY     Status: Abnormal   Collection Time   10/10/12  5:15 AM      Component Value Range Comment   Glucose-Capillary 283 (*) 70 - 99 mg/dL   PROTIME-INR     Status: Abnormal   Collection Time   10/10/12  6:10 AM      Component Value Range Comment   Prothrombin Time 29.4 (*) 11.6 - 15.2 seconds    INR 2.98 (*) 0.00 - 1.49   GLUCOSE, CAPILLARY     Status: Abnormal   Collection Time   10/10/12  7:58 AM      Component Value Range Comment   Glucose-Capillary 297 (*) 70 - 99 mg/dL    Comment 1 Notify RN     GLUCOSE, CAPILLARY     Status: Abnormal   Collection Time   10/10/12 11:28 AM       Component Value Range Comment   Glucose-Capillary 326 (*) 70 - 99 mg/dL    Comment 1 Notify RN     GLUCOSE, CAPILLARY     Status: Abnormal   Collection Time   10/10/12  4:26 PM      Component Value Range Comment   Glucose-Capillary 66 (*) 70 - 99 mg/dL    Comment 1 Notify RN     GLUCOSE, CAPILLARY     Status: Normal   Collection Time   10/10/12  5:02 PM      Component Value Range Comment   Glucose-Capillary 89  70 - 99 mg/dL   GLUCOSE, CAPILLARY     Status: Abnormal   Collection Time   10/10/12  8:02 PM      Component Value Range Comment   Glucose-Capillary 149 (*) 70 - 99 mg/dL   GLUCOSE, CAPILLARY     Status: Abnormal   Collection Time   10/10/12 11:57  PM      Component Value Range Comment   Glucose-Capillary 247 (*) 70 - 99 mg/dL    Comment 1 Notify RN     GLUCOSE, CAPILLARY     Status: Abnormal   Collection Time   10/11/12  4:12 AM      Component Value Range Comment   Glucose-Capillary 225 (*) 70 - 99 mg/dL    Comment 1 Notify RN     PROTIME-INR     Status: Abnormal   Collection Time   10/11/12  5:40 AM      Component Value Range Comment   Prothrombin Time 26.4 (*) 11.6 - 15.2 seconds    INR 2.58 (*) 0.00 - 1.49   GLUCOSE, CAPILLARY     Status: Abnormal   Collection Time   10/11/12  7:38 AM      Component Value Range Comment   Glucose-Capillary 247 (*) 70 - 99 mg/dL    Comment 1 Notify RN     GLUCOSE, CAPILLARY     Status: Abnormal   Collection Time   10/11/12 11:27 AM      Component Value Range Comment   Glucose-Capillary 284 (*) 70 - 99 mg/dL    Comment 1 Notify RN     GLUCOSE, CAPILLARY     Status: Abnormal   Collection Time   10/11/12  4:28 PM      Component Value Range Comment   Glucose-Capillary 110 (*) 70 - 99 mg/dL   GLUCOSE, CAPILLARY     Status: Abnormal   Collection Time   10/11/12  9:26 PM      Component Value Range Comment   Glucose-Capillary 216 (*) 70 - 99 mg/dL   GLUCOSE, CAPILLARY     Status: Abnormal   Collection Time   10/12/12  12:35 AM      Component Value Range Comment   Glucose-Capillary 233 (*) 70 - 99 mg/dL    Comment 1 Notify RN     GLUCOSE, CAPILLARY     Status: Abnormal   Collection Time   10/12/12  4:56 AM      Component Value Range Comment   Glucose-Capillary 243 (*) 70 - 99 mg/dL    Comment 1 Notify RN     PROTIME-INR     Status: Abnormal   Collection Time   10/12/12  6:30 AM      Component Value Range Comment   Prothrombin Time 24.9 (*) 11.6 - 15.2 seconds    INR 2.38 (*) 0.00 - 1.49   GLUCOSE, CAPILLARY     Status: Abnormal   Collection Time   10/12/12  8:08 AM      Component Value Range Comment   Glucose-Capillary 219 (*) 70 - 99 mg/dL      HEENT: normal Cardio: irregular Resp: CTA B/L GI: BS positive Extremity:  Cyanosis Skin:   Breakdown R great toe tip ulcer, scab has been removed fresh granulation tissue without active bleeding Neuro: Lethargic, Abnormal Motor R side 3-/5 in UE and 3/5 in LE and Dysarthric Musc/Skel:  Other L BKA   Assessment/Plan: 1. Functional deficits secondary to L frontal infarct which require 3+ hours per day of interdisciplinary therapy in a comprehensive inpatient rehab setting. Physiatrist is providing close team supervision and 24 hour management of active medical problems listed below. Physiatrist and rehab team continue to assess barriers to discharge/monitor patient progress toward functional and medical goals. FIM: FIM - Bathing Bathing Steps Patient Completed: Chest;Right Arm;Abdomen;Front perineal area;Right upper leg;Left  upper leg;Right lower leg (including foot) Bathing: 3: Mod-Patient completes 5-7 98f 10 parts or 50-74%  FIM - Upper Body Dressing/Undressing Upper body dressing/undressing steps patient completed: Thread/unthread right sleeve of pullover shirt/dresss;Thread/unthread left sleeve of pullover shirt/dress;Put head through opening of pull over shirt/dress;Pull shirt over trunk Upper body dressing/undressing: 4: Min-Patient completed  75 plus % of tasks FIM - Lower Body Dressing/Undressing Lower body dressing/undressing steps patient completed: Thread/unthread right underwear leg;Thread/unthread left underwear leg;Thread/unthread right pants leg;Thread/unthread left pants leg Lower body dressing/undressing: 3: Mod-Patient completed 50-74% of tasks  FIM - Toileting Toileting steps completed by patient: Adjust clothing prior to toileting Toileting Assistive Devices: Grab bar or rail for support Toileting: 2: Max-Patient completed 1 of 3 steps  FIM - Diplomatic Services operational officer Devices: Grab bars Toilet Transfers: 3-From toilet/BSC: Mod A (lift or lower assist);3-To toilet/BSC: Mod A (lift or lower assist)  FIM - Bed/Chair Transfer Bed/Chair Transfer Assistive Devices: HOB elevated;Arm rests;Bed rails Bed/Chair Transfer: 4: Supine > Sit: Min A (steadying Pt. > 75%/lift 1 leg);4: Sit > Supine: Min A (steadying pt. > 75%/lift 1 leg);3: Bed > Chair or W/C: Mod A (lift or lower assist);3: Chair or W/C > Bed: Mod A (lift or lower assist)  FIM - Locomotion: Wheelchair Locomotion: Wheelchair: 5: Travels 150 ft or more: maneuvers on rugs and over door sills with supervision, cueing or coaxing FIM - Locomotion: Ambulation Locomotion: Ambulation Assistive Devices: Parallel bars Ambulation/Gait Assistance: 1: +2 Total assist Locomotion: Ambulation: 1: Two helpers  Comprehension Comprehension Mode: Auditory Comprehension: 5-Follows basic conversation/direction: With extra time/assistive device  Expression Expression Mode: Verbal Expression: 5-Expresses basic 90% of the time/requires cueing < 10% of the time.  Social Interaction Social Interaction: 5-Interacts appropriately 90% of the time - Needs monitoring or encouragement for participation or interaction.  Problem Solving Problem Solving: 3-Solves basic 50 - 74% of the time/requires cueing 25 - 49% of the time  Memory Memory: 2-Recognizes or recalls 25 -  49% of the time/requires cueing 51 - 75% of the time  Medical Problem List and Plan:  1. Left frontal lobe embolic infarct/recent left below-knee amputation 07/22/2012  2. DVT Prophylaxis/Anticoagulation: Chronic Coumadin therapy. Monitor for any bleeding episodes  3. Pain Management: Tylenol as needed. Monitor with increased mobility  4. Neuropsych: This patient is capable of making decisions on his/her own behalf.Home med was cymbalta will resume  5. Hypertension/atrial fibrillation. Cardiac rate controlled. Continue Norvasc 10 mg daily, Lopressor 75 mg twice a day and Demadex 10 mg twice a day. Monitor with increased activity  6. Diabetes mellitus.uncontrolled  Hemoglobin A1c of 8.0. increase Lantus insulin 25 units daily. Check blood sugars a.c. and at bedtime see discussion above 7. Mild compression fractures T7 and T10 as well his chronic compression fractures with cemented vertebral augmentation T11, T12 and L1. No plans for a further vertebral plasty at this time and followup as outpatient unable to do this currently since he would need to be off warfarin -advise the use of a soft lumbar-thoracic corset  -lidoderm patches  8. Left BKA- well shaped. Continue stump shrinker.  -prosthesis at some point after dc once improved from a stroke and back standpoint. 9.  Toe lesion will order neosporin ointment and drsg changes, no further bleeding  LOS (Days) 9 A FACE TO FACE EVALUATION WAS PERFORMED  KIRSTEINS,ANDREW E 10/12/2012, 9:57 AM

## 2012-10-12 NOTE — Progress Notes (Signed)
Inpatient Diabetes Program Recommendations  AACE/ADA: New Consensus Statement on Inpatient Glycemic Control (2013)  Target Ranges:  Prepandial:   less than 140 mg/dL      Peak postprandial:   less than 180 mg/dL (1-2 hours)      Critically ill patients:  140 - 180 mg/dL   Reason for Visit: Hyperglycemia  Results for ARLAN, BIRKS (MRN [id number]) as of 10/12/2012 16:50  Ref. Range 10/11/2012 21:26 10/12/2012 00:35 10/12/2012 04:56 10/12/2012 08:08 10/12/2012 11:44  Glucose-Capillary Latest Range: 70-99 mg/dL 409 (H) 811 (H) 914 (H) 219 (H) 247 (H)   Please consider addition of meal coverage insulin - Novolog 4 units tidwc. Noted increase in Lantus to 25 units QD to start 10/26.  Will follow.

## 2012-10-12 NOTE — Progress Notes (Signed)
Speech Language Pathology Daily Session Note  Patient Details  Name: Eulalio Reamy MRN: [id number] Date of Birth: [birth year]  Today's Date: 10/12/2012 Time: 0930-1000 Time Calculation (min): 30 min  Short Term Goals: Week 2: SLP Short Term Goal 1 (Week 2): Patient will tolerate trials of thin liquids and regular textures without the use of a chin tuck with no overt s/s of aspiration with modified independence. SLP Short Term Goal 2 (Week 2): Patient will utilize speech intelligibility strategies during conversation with supervision level assist visual and verbal cues. SLP Short Term Goal 3 (Week 2): Patient will use external and internal memory aids to facilitate increased recall of new information with mod assist verbal cues.  Skilled Therapeutic Interventions: Treatment session focused on addressing dysphagia and speech intelligibilty goals.  Patient consumed thin liquids via straw with mod I use of multiple swallows with a head neutral position and demonstrated no overt s/s of aspiration.  As a result, swallowing precautions have been discharged.  SLP also facilitated session with supervision-min assist verbal cues to increase speech intelligibility throughout session and max assist to recall and utilize daily schedule.    FIM:  Comprehension Comprehension Mode: Auditory Comprehension: 5-Follows basic conversation/direction: With extra time/assistive device Expression Expression Mode: Verbal Expression: 5-Expresses basic 90% of the time/requires cueing < 10% of the time. Social Interaction Social Interaction: 5-Interacts appropriately 90% of the time - Needs monitoring or encouragement for participation or interaction. Memory Memory: 2-Recognizes or recalls 25 - 49% of the time/requires cueing 51 - 75% of the time FIM - Eating Eating Activity: 5: Set-up assist for open containers  Pain Pain Assessment Pain Assessment: No/denies pain  Therapy/Group: Individual Therapy  Charlane Ferretti., CCC-SLP [phone]  Aniyla Harling 10/12/2012, 9:58 AM

## 2012-10-13 ENCOUNTER — Inpatient Hospital Stay (HOSPITAL_COMMUNITY): Payer: Medicare Other | Admitting: *Deleted

## 2012-10-13 LAB — PROTIME-INR
INR: 2.24 — ABNORMAL HIGH (ref 0.00–1.49)
Prothrombin Time: 23.8 seconds — ABNORMAL HIGH (ref 11.6–15.2)

## 2012-10-13 LAB — GLUCOSE, CAPILLARY
Glucose-Capillary: 237 mg/dL — ABNORMAL HIGH (ref 70–99)
Glucose-Capillary: 242 mg/dL — ABNORMAL HIGH (ref 70–99)

## 2012-10-13 MED ORDER — WARFARIN SODIUM 2.5 MG PO TABS
2.5000 mg | ORAL_TABLET | Freq: Every day | ORAL | Status: DC
  Administered 2012-10-13 – 2012-10-23 (×11): 2.5 mg via ORAL
  Filled 2012-10-13 (×13): qty 1

## 2012-10-13 NOTE — Progress Notes (Signed)
ANTICOAGULATION CONSULT NOTE - Follow Up Consult  Pharmacy Consult for coumadin  Indication: atrial fibrillation, secondary stroke prophylaxis  Patient Measurements: Height: 5\' 8"  (172.7 cm) Weight: 150 lb 9.2 oz (68.3 kg) IBW/kg (Calculated) : 68.4   Vital Signs: Temp: 98.1 F (36.7 C) (10/26 0500) Temp src: Oral (10/26 0500) BP: 148/75 mmHg (10/26 0500) Pulse Rate: 65  (10/26 0500)  Labs:  Basename 10/13/12 0640 10/12/12 0630 10/11/12 0540  HGB -- -- --  HCT -- -- --  PLT -- -- --  APTT -- -- --  LABPROT 23.8* 24.9* 26.4*  INR 2.24* 2.38* 2.58*  HEPARINUNFRC -- -- --  CREATININE -- -- --  CKTOTAL -- -- --  CKMB -- -- --  TROPONINI -- -- --   Estimated Creatinine Clearance: 43 ml/min (by C-G formula based on Cr of 1.19).  Assessment: 78 y/o male patient with history of afib and stroke on coumadin PTA (2.5mg /day).  Patient was on Pradaxa but family prefers coumadin. INR today is therapeutic.  Goal of Therapy:  INR 2-3 Monitor platelets by anticoagulation protocol: Yes  Plan:  Resume warfarin 2.5mg  daily as at home.  Check INR 10/28.    10/13/2012 10:33 AM

## 2012-10-13 NOTE — Progress Notes (Signed)
Patient ID: Aaron [name], male   DOB: [birth year], 78 y.o.   MRN: [id number]   Subjective/Complaints: No pain c/os, participates in PT/OT/SLP Review of Systems  Unable to perform ROS: mental acuity   Objective: Vital Signs: Blood pressure 148/75, pulse 65, temperature 98.1 F (36.7 C), temperature source Oral, resp. rate 18, height 5\' 8"  (1.727 m), weight 68.3 kg (150 lb 9.2 oz), SpO2 94.00%. No results found. Results for orders placed during the hospital encounter of 10/03/12 (from the past 72 hour(s))  GLUCOSE, CAPILLARY     Status: Abnormal   Collection Time   10/10/12 11:28 AM      Component Value Range Comment   Glucose-Capillary 326 (*) 70 - 99 mg/dL    Comment 1 Notify RN     GLUCOSE, CAPILLARY     Status: Abnormal   Collection Time   10/10/12  4:26 PM      Component Value Range Comment   Glucose-Capillary 66 (*) 70 - 99 mg/dL    Comment 1 Notify RN     GLUCOSE, CAPILLARY     Status: Normal   Collection Time   10/10/12  5:02 PM      Component Value Range Comment   Glucose-Capillary 89  70 - 99 mg/dL   GLUCOSE, CAPILLARY     Status: Abnormal   Collection Time   10/10/12  8:02 PM      Component Value Range Comment   Glucose-Capillary 149 (*) 70 - 99 mg/dL   GLUCOSE, CAPILLARY     Status: Abnormal   Collection Time   10/10/12 11:57 PM      Component Value Range Comment   Glucose-Capillary 247 (*) 70 - 99 mg/dL    Comment 1 Notify RN     GLUCOSE, CAPILLARY     Status: Abnormal   Collection Time   10/11/12  4:12 AM      Component Value Range Comment   Glucose-Capillary 225 (*) 70 - 99 mg/dL    Comment 1 Notify RN     PROTIME-INR     Status: Abnormal   Collection Time   10/11/12  5:40 AM      Component Value Range Comment   Prothrombin Time 26.4 (*) 11.6 - 15.2 seconds    INR 2.58 (*) 0.00 - 1.49   GLUCOSE, CAPILLARY     Status: Abnormal   Collection Time   10/11/12  7:38 AM      Component Value Range Comment   Glucose-Capillary 247 (*) 70 - 99 mg/dL    Comment 1 Notify RN     GLUCOSE, CAPILLARY     Status: Abnormal   Collection Time   10/11/12 11:27 AM      Component Value Range Comment   Glucose-Capillary 284 (*) 70 - 99 mg/dL    Comment 1 Notify RN     GLUCOSE, CAPILLARY     Status: Abnormal   Collection Time   10/11/12  4:28 PM      Component Value Range Comment   Glucose-Capillary 110 (*) 70 - 99 mg/dL   GLUCOSE, CAPILLARY     Status: Abnormal   Collection Time   10/11/12  9:26 PM      Component Value Range Comment   Glucose-Capillary 216 (*) 70 - 99 mg/dL   GLUCOSE, CAPILLARY     Status: Abnormal   Collection Time   10/12/12 12:35 AM      Component Value Range Comment   Glucose-Capillary 233 (*) 70 - 99  mg/dL    Comment 1 Notify RN     GLUCOSE, CAPILLARY     Status: Abnormal   Collection Time   10/12/12  4:56 AM      Component Value Range Comment   Glucose-Capillary 243 (*) 70 - 99 mg/dL    Comment 1 Notify RN     PROTIME-INR     Status: Abnormal   Collection Time   10/12/12  6:30 AM      Component Value Range Comment   Prothrombin Time 24.9 (*) 11.6 - 15.2 seconds    INR 2.38 (*) 0.00 - 1.49   GLUCOSE, CAPILLARY     Status: Abnormal   Collection Time   10/12/12  8:08 AM      Component Value Range Comment   Glucose-Capillary 219 (*) 70 - 99 mg/dL   GLUCOSE, CAPILLARY     Status: Abnormal   Collection Time   10/12/12 11:44 AM      Component Value Range Comment   Glucose-Capillary 247 (*) 70 - 99 mg/dL    Comment 1 Documented in Chart      Comment 2 Notify RN     GLUCOSE, CAPILLARY     Status: Normal   Collection Time   10/12/12  4:25 PM      Component Value Range Comment   Glucose-Capillary 99  70 - 99 mg/dL   GLUCOSE, CAPILLARY     Status: Abnormal   Collection Time   10/12/12  8:19 PM      Component Value Range Comment   Glucose-Capillary 255 (*) 70 - 99 mg/dL    Comment 1 Notify RN     GLUCOSE, CAPILLARY     Status: Abnormal   Collection Time   10/13/12 12:19 AM      Component Value Range Comment     Glucose-Capillary 242 (*) 70 - 99 mg/dL    Comment 1 Notify RN     GLUCOSE, CAPILLARY     Status: Abnormal   Collection Time   10/13/12  4:10 AM      Component Value Range Comment   Glucose-Capillary 251 (*) 70 - 99 mg/dL    Comment 1 Notify RN     PROTIME-INR     Status: Abnormal   Collection Time   10/13/12  6:40 AM      Component Value Range Comment   Prothrombin Time 23.8 (*) 11.6 - 15.2 seconds    INR 2.24 (*) 0.00 - 1.49   GLUCOSE, CAPILLARY     Status: Abnormal   Collection Time   10/13/12  7:51 AM      Component Value Range Comment   Glucose-Capillary 237 (*) 70 - 99 mg/dL    Comment 1 Notify RN        HEENT: normal Cardio: irregular Resp: CTA B/L GI: BS positive Extremity:  Cyanosis Skin:   Breakdown R great toe tip ulcer, scab has been removed fresh granulation tissue without active bleeding Neuro: Lethargic, Abnormal Motor R side 3-/5 in UE and 3/5 in LE and Dysarthric Musc/Skel:  Other L BKA   Assessment/Plan: 1. Functional deficits secondary to L frontal infarct which require 3+ hours per day of interdisciplinary therapy in a comprehensive inpatient rehab setting. Physiatrist is providing close team supervision and 24 hour management of active medical problems listed below. Physiatrist and rehab team continue to assess barriers to discharge/monitor patient progress toward functional and medical goals. FIM: FIM - Bathing Bathing Steps Patient Completed: Chest;Right Arm;Abdomen;Front perineal area;Right  upper leg;Left upper leg;Right lower leg (including foot) Bathing: 3: Mod-Patient completes 5-7 68f 10 parts or 50-74%  FIM - Upper Body Dressing/Undressing Upper body dressing/undressing steps patient completed: Thread/unthread right sleeve of pullover shirt/dresss;Thread/unthread left sleeve of pullover shirt/dress;Put head through opening of pull over shirt/dress;Pull shirt over trunk Upper body dressing/undressing: 5: Set-up assist to: Obtain clothing/put  away FIM - Lower Body Dressing/Undressing Lower body dressing/undressing steps patient completed: Thread/unthread right underwear leg;Thread/unthread left underwear leg;Thread/unthread right pants leg;Thread/unthread left pants leg;Don/Doff right shoe;Fasten/unfasten right shoe Lower body dressing/undressing: 3: Mod-Patient completed 50-74% of tasks  FIM - Toileting Toileting steps completed by patient: Adjust clothing prior to toileting Toileting Assistive Devices: Grab bar or rail for support Toileting: 2: Max-Patient completed 1 of 3 steps  FIM - Diplomatic Services operational officer Devices: Grab bars Toilet Transfers: 3-From toilet/BSC: Mod A (lift or lower assist);3-To toilet/BSC: Mod A (lift or lower assist)  FIM - Bed/Chair Transfer Bed/Chair Transfer Assistive Devices: HOB elevated;Arm rests;Bed rails Bed/Chair Transfer: 4: Supine > Sit: Min A (steadying Pt. > 75%/lift 1 leg);4: Sit > Supine: Min A (steadying pt. > 75%/lift 1 leg);3: Bed > Chair or W/C: Mod A (lift or lower assist);3: Chair or W/C > Bed: Mod A (lift or lower assist)  FIM - Locomotion: Wheelchair Locomotion: Wheelchair: 1: Total Assistance/staff pushes wheelchair (Pt<25%) FIM - Locomotion: Ambulation Locomotion: Ambulation Assistive Devices: Designer, industrial/product Ambulation/Gait Assistance: 3: Mod assist Locomotion: Ambulation: 1: Travels less than 50 ft with moderate assistance (Pt: 50 - 74%)  Comprehension Comprehension Mode: Auditory Comprehension: 5-Follows basic conversation/direction: With extra time/assistive device  Expression Expression Mode: Verbal Expression: 5-Expresses basic 90% of the time/requires cueing < 10% of the time.  Social Interaction Social Interaction: 5-Interacts appropriately 90% of the time - Needs monitoring or encouragement for participation or interaction.  Problem Solving Problem Solving: 3-Solves basic 50 - 74% of the time/requires cueing 25 - 49% of the  time  Memory Memory: 3-Recognizes or recalls 50 - 74% of the time/requires cueing 25 - 49% of the time  Medical Problem List and Plan:  1. Left frontal lobe embolic infarct/recent left below-knee amputation 07/22/2012  2. DVT Prophylaxis/Anticoagulation: Chronic Coumadin therapy. Monitor for any bleeding episodes  3. Pain Management: Tylenol as needed. Monitor with increased mobility  4. Neuropsych: This patient is capable of making decisions on his/her own behalf.Home med was cymbalta will resume  5. Hypertension/atrial fibrillation. Cardiac rate controlled. Continue Norvasc 10 mg daily, Lopressor 75 mg twice a day and Demadex 10 mg twice a day. Monitor with increased activity  6. Diabetes mellitus.uncontrolled  Hemoglobin A1c of 8.0. increase Lantus insulin 25 units daily. Check blood sugars a.c. and at bedtime see discussion above 7. Mild compression fractures T7 and T10 as well his chronic compression fractures with cemented vertebral augmentation T11, T12 and L1. No plans for a further vertebral plasty at this time and followup as outpatient unable to do this currently since he would need to be off warfarin -advise the use of a soft lumbar-thoracic corset  -lidoderm patches  8. Left BKA- well shaped. Continue stump shrinker.  -prosthesis at some point after dc once improved from a stroke and back standpoint. 9.  Toe lesion will order neosporin ointment and drsg changes, no further bleeding  LOS (Days) 10 A FACE TO FACE EVALUATION WAS PERFORMED  Shayda Kalka E 10/13/2012, 10:08 AM

## 2012-10-13 NOTE — Progress Notes (Signed)
Physical Therapy Note  Patient Details  Name: Aaron [name] MRN: [id number] Date of Birth: 1924/04/03 Today's Date: 10/13/2012  1300 -1355 (55 minutes) group  Pain : no complaint of pain Pt participated in PT group session focused on bilateral LE AROM/strengthening including LT BKA exercises. Transfers - setup + min assist squat/pivot wc >< mat; sit to supine SBA; supine to side to sit min tactile cues ; LT BKA  Hip /knee flexion and extension, hip abduction , SAQs; RT LE ankle pumps, SAQS, heel slides, hip abduction (all X 15); sit to stand to SW min/mod  assist X 2 ; wc mobility SBA 120 feet using LT UE/RT LE.   Aleshia Cartelli,[name] 10/13/2012, 8:00 AM

## 2012-10-14 ENCOUNTER — Inpatient Hospital Stay (HOSPITAL_COMMUNITY): Payer: Medicare Other | Admitting: Physical Therapy

## 2012-10-14 LAB — GLUCOSE, CAPILLARY
Glucose-Capillary: 230 mg/dL — ABNORMAL HIGH (ref 70–99)
Glucose-Capillary: 232 mg/dL — ABNORMAL HIGH (ref 70–99)
Glucose-Capillary: 253 mg/dL — ABNORMAL HIGH (ref 70–99)

## 2012-10-14 MED ORDER — INSULIN GLARGINE 100 UNIT/ML ~~LOC~~ SOLN
27.0000 [IU] | Freq: Every day | SUBCUTANEOUS | Status: DC
  Administered 2012-10-15 – 2012-10-19 (×5): 27 [IU] via SUBCUTANEOUS

## 2012-10-14 NOTE — Progress Notes (Signed)
Physical Therapy Session Note  Patient Details  Name: Aaron [name] MRN: [id number] Date of Birth: July 13, 1924  Today's Date: 10/14/2012 Time: 1004-1050 Time Calculation (min): 46 min  Short Term Goals: Week 2:  PT Short Term Goal 1 (Week 2): Patient will perform bed mobility with HOB flat, no bed rails, and supervision to simulate home environment PT Short Term Goal 2 (Week 2): Patient will perform supine > sit transfers with HOB flat, no bed rails, and supervision PT Short Term Goal 3 (Week 2): Patient will perform squat pivot bed <> w/c transfer consistently with min A  PT Short Term Goal 4 (Week 2): Patient will demonstrate improvement in standing tolerance by standing for >5 min without rest break  Skilled Therapeutic Interventions/Progress Updates:    Pt participated in OEG for strengthening exercise.  Pt performed 15 reps of SLR, hip ab/adduction, isometric hip adduction, SAQ, bilaterally.  UE bike x 10 min.  Squat pivot transfers with min@ and supine to sit with supervision.  Pt falling asleep between exercises even with multiple family members present.  Therapy Documentation Precautions:  Precautions Precautions: Fall Precaution Comments: Lumbar corset to be worn OOB; LLE BKA, AFIB Required Braces or Orthoses: Spinal Brace Spinal Brace: Lumbar corset Restrictions Weight Bearing Restrictions: No Pain:  No pain reported  See FIM for current functional status  Therapy/Group: Group Therapy  Georges Mouse 10/14/2012, 11:25 AM

## 2012-10-14 NOTE — Progress Notes (Signed)
Patient ID: Aaron [name], male   DOB: 02-22-1924, 78 y.o.   MRN: [id number]   Subjective/Complaints: No pain c/os, participates in PT/OT/SLP Review of Systems  Unable to perform ROS: mental acuity   Objective: Vital Signs: Blood pressure 156/86, pulse 60, temperature 98.1 F (36.7 C), temperature source Oral, resp. rate 18, height 5\' 8"  (1.727 m), weight 68.3 kg (150 lb 9.2 oz), SpO2 97.00%. No results found. Results for orders placed during the hospital encounter of 10/03/12 (from the past 72 hour(s))  GLUCOSE, CAPILLARY     Status: Abnormal   Collection Time   10/11/12 11:27 AM      Component Value Range Comment   Glucose-Capillary 284 (*) 70 - 99 mg/dL    Comment 1 Notify RN     GLUCOSE, CAPILLARY     Status: Abnormal   Collection Time   10/11/12  4:28 PM      Component Value Range Comment   Glucose-Capillary 110 (*) 70 - 99 mg/dL   GLUCOSE, CAPILLARY     Status: Abnormal   Collection Time   10/11/12  9:26 PM      Component Value Range Comment   Glucose-Capillary 216 (*) 70 - 99 mg/dL   GLUCOSE, CAPILLARY     Status: Abnormal   Collection Time   10/12/12 12:35 AM      Component Value Range Comment   Glucose-Capillary 233 (*) 70 - 99 mg/dL    Comment 1 Notify RN     GLUCOSE, CAPILLARY     Status: Abnormal   Collection Time   10/12/12  4:56 AM      Component Value Range Comment   Glucose-Capillary 243 (*) 70 - 99 mg/dL    Comment 1 Notify RN     PROTIME-INR     Status: Abnormal   Collection Time   10/12/12  6:30 AM      Component Value Range Comment   Prothrombin Time 24.9 (*) 11.6 - 15.2 seconds    INR 2.38 (*) 0.00 - 1.49   GLUCOSE, CAPILLARY     Status: Abnormal   Collection Time   10/12/12  8:08 AM      Component Value Range Comment   Glucose-Capillary 219 (*) 70 - 99 mg/dL   GLUCOSE, CAPILLARY     Status: Abnormal   Collection Time   10/12/12 11:44 AM      Component Value Range Comment   Glucose-Capillary 247 (*) 70 - 99 mg/dL    Comment 1 Documented in  Chart      Comment 2 Notify RN     GLUCOSE, CAPILLARY     Status: Normal   Collection Time   10/12/12  4:25 PM      Component Value Range Comment   Glucose-Capillary 99  70 - 99 mg/dL   GLUCOSE, CAPILLARY     Status: Abnormal   Collection Time   10/12/12  8:19 PM      Component Value Range Comment   Glucose-Capillary 255 (*) 70 - 99 mg/dL    Comment 1 Notify RN     GLUCOSE, CAPILLARY     Status: Abnormal   Collection Time   10/13/12 12:19 AM      Component Value Range Comment   Glucose-Capillary 242 (*) 70 - 99 mg/dL    Comment 1 Notify RN     GLUCOSE, CAPILLARY     Status: Abnormal   Collection Time   10/13/12  4:10 AM      Component  Value Range Comment   Glucose-Capillary 251 (*) 70 - 99 mg/dL    Comment 1 Notify RN     PROTIME-INR     Status: Abnormal   Collection Time   10/13/12  6:40 AM      Component Value Range Comment   Prothrombin Time 23.8 (*) 11.6 - 15.2 seconds    INR 2.24 (*) 0.00 - 1.49   GLUCOSE, CAPILLARY     Status: Abnormal   Collection Time   10/13/12  7:51 AM      Component Value Range Comment   Glucose-Capillary 237 (*) 70 - 99 mg/dL    Comment 1 Notify RN     GLUCOSE, CAPILLARY     Status: Abnormal   Collection Time   10/13/12  4:27 PM      Component Value Range Comment   Glucose-Capillary 184 (*) 70 - 99 mg/dL   GLUCOSE, CAPILLARY     Status: Abnormal   Collection Time   10/13/12  8:45 PM      Component Value Range Comment   Glucose-Capillary 249 (*) 70 - 99 mg/dL    Comment 1 Notify RN     GLUCOSE, CAPILLARY     Status: Abnormal   Collection Time   10/14/12 12:00 AM      Component Value Range Comment   Glucose-Capillary 242 (*) 70 - 99 mg/dL    Comment 1 Notify RN     GLUCOSE, CAPILLARY     Status: Abnormal   Collection Time   10/14/12  4:05 AM      Component Value Range Comment   Glucose-Capillary 230 (*) 70 - 99 mg/dL    Comment 1 Notify RN     GLUCOSE, CAPILLARY     Status: Abnormal   Collection Time   10/14/12  7:33 AM       Component Value Range Comment   Glucose-Capillary 232 (*) 70 - 99 mg/dL    Comment 1 Notify RN        HEENT: normal Cardio: irregular Resp: CTA B/L GI: BS positive Extremity:  Cyanosis Skin:   Breakdown R great toe tip ulcer, scab has been removed fresh granulation tissue without active bleeding Neuro: Lethargic, Abnormal Motor R side 3-/5 in UE and 3/5 in LE and Dysarthric Musc/Skel:  Other L BKA   Assessment/Plan: 1. Functional deficits secondary to L frontal infarct which require 3+ hours per day of interdisciplinary therapy in a comprehensive inpatient rehab setting. Physiatrist is providing close team supervision and 24 hour management of active medical problems listed below. Physiatrist and rehab team continue to assess barriers to discharge/monitor patient progress toward functional and medical goals. FIM: FIM - Bathing Bathing Steps Patient Completed: Chest;Right Arm;Abdomen;Front perineal area;Right upper leg;Left upper leg;Right lower leg (including foot) Bathing: 3: Mod-Patient completes 5-7 70f 10 parts or 50-74%  FIM - Upper Body Dressing/Undressing Upper body dressing/undressing steps patient completed: Thread/unthread right sleeve of pullover shirt/dresss;Thread/unthread left sleeve of pullover shirt/dress;Put head through opening of pull over shirt/dress;Pull shirt over trunk Upper body dressing/undressing: 5: Set-up assist to: Obtain clothing/put away FIM - Lower Body Dressing/Undressing Lower body dressing/undressing steps patient completed: Thread/unthread right underwear leg;Thread/unthread left underwear leg;Thread/unthread right pants leg;Thread/unthread left pants leg;Don/Doff right shoe;Fasten/unfasten right shoe Lower body dressing/undressing: 3: Mod-Patient completed 50-74% of tasks  FIM - Toileting Toileting steps completed by patient: Adjust clothing prior to toileting Toileting Assistive Devices: Grab bar or rail for support Toileting: 2: Max-Patient  completed 1 of 3 steps  FIM - Diplomatic Services operational officer Devices: Grab bars Toilet Transfers: 3-From toilet/BSC: Mod A (lift or lower assist);3-To toilet/BSC: Mod A (lift or lower assist)  FIM - Bed/Chair Transfer Bed/Chair Transfer Assistive Devices: HOB elevated;Arm rests;Bed rails Bed/Chair Transfer: 4: Supine > Sit: Min A (steadying Pt. > 75%/lift 1 leg);4: Sit > Supine: Min A (steadying pt. > 75%/lift 1 leg);3: Bed > Chair or W/C: Mod A (lift or lower assist);3: Chair or W/C > Bed: Mod A (lift or lower assist)  FIM - Locomotion: Wheelchair Locomotion: Wheelchair: 1: Total Assistance/staff pushes wheelchair (Pt<25%) FIM - Locomotion: Ambulation Locomotion: Ambulation Assistive Devices: Designer, industrial/product Ambulation/Gait Assistance: 3: Mod assist Locomotion: Ambulation: 1: Travels less than 50 ft with moderate assistance (Pt: 50 - 74%)  Comprehension Comprehension Mode: Auditory Comprehension: 5-Follows basic conversation/direction: With extra time/assistive device  Expression Expression Mode: Verbal Expression: 5-Expresses basic 90% of the time/requires cueing < 10% of the time.  Social Interaction Social Interaction: 5-Interacts appropriately 90% of the time - Needs monitoring or encouragement for participation or interaction.  Problem Solving Problem Solving: 3-Solves basic 50 - 74% of the time/requires cueing 25 - 49% of the time  Memory Memory: 3-Recognizes or recalls 50 - 74% of the time/requires cueing 25 - 49% of the time  Medical Problem List and Plan:  1. Left frontal lobe embolic infarct/recent left below-knee amputation 07/22/2012  2. DVT Prophylaxis/Anticoagulation: Chronic Coumadin therapy. Monitor for any bleeding episodes  3. Pain Management: Tylenol as needed. Monitor with increased mobility  4. Neuropsych: This patient is capable of making decisions on his/her own behalf.Home med was cymbalta will resume  5. Hypertension/atrial fibrillation.  Cardiac rate controlled. Continue Norvasc 10 mg daily, Lopressor 75 mg twice a day and Demadex 10 mg twice a day. Monitor with increased activity  6. Diabetes mellitus.uncontrolled  Hemoglobin A1c of 8.0. increase Lantus insulin 27 units daily. Check blood sugars a.c. and at bedtime see discussion above 7. Mild compression fractures T7 and T10 as well his chronic compression fractures with cemented vertebral augmentation T11, T12 and L1. No plans for a further vertebral plasty at this time and followup as outpatient unable to do this currently since he would need to be off warfarin -advise the use of a soft lumbar-thoracic corset  -lidoderm patches  8. Left BKA- well shaped. Continue stump shrinker.  -prosthesis at some point after dc once improved from a stroke and back standpoint. 9.  Toe lesion will order neosporin ointment and drsg changes, no further bleeding  LOS (Days) 11 A FACE TO FACE EVALUATION WAS PERFORMED  KIRSTEINS,ANDREW E 10/14/2012, 8:51 AM

## 2012-10-15 ENCOUNTER — Inpatient Hospital Stay (HOSPITAL_COMMUNITY): Payer: Medicare Other | Admitting: Occupational Therapy

## 2012-10-15 ENCOUNTER — Inpatient Hospital Stay (HOSPITAL_COMMUNITY): Payer: Medicare Other | Admitting: Physical Therapy

## 2012-10-15 ENCOUNTER — Inpatient Hospital Stay (HOSPITAL_COMMUNITY): Payer: Medicare Other | Admitting: Speech Pathology

## 2012-10-15 LAB — GLUCOSE, CAPILLARY
Glucose-Capillary: 106 mg/dL — ABNORMAL HIGH (ref 70–99)
Glucose-Capillary: 130 mg/dL — ABNORMAL HIGH (ref 70–99)
Glucose-Capillary: 138 mg/dL — ABNORMAL HIGH (ref 70–99)
Glucose-Capillary: 145 mg/dL — ABNORMAL HIGH (ref 70–99)
Glucose-Capillary: 99 mg/dL (ref 70–99)

## 2012-10-15 NOTE — Progress Notes (Signed)
Inpatient Diabetes Program Recommendations  AACE/ADA: New Consensus Statement on Inpatient Glycemic Control (2013)  Target Ranges:  Prepandial:   less than 140 mg/dL      Peak postprandial:   less than 180 mg/dL (1-2 hours)      Critically ill patients:  140 - 180 mg/dL   Reason for Visit: No need to increase Lantus  Inpatient Diabetes Program Recommendations Insulin - Basal: Please do not increase lantus dose again. Fasting glucose this am at 99 mg/dL.  Lantus affects primarily the fasting glucose.   Insulin - Meal Coverage: If post-prandials increase, consider addition of meal coverage 3-4 units tidwc.  Note: Thank you, Lenor Coffin, RN, CNS, Diabetes Coordinator [phone])

## 2012-10-15 NOTE — Progress Notes (Signed)
Patient ID: Aaron [name], male   DOB: August 10, 1924, 78 y.o.   MRN: [id number]   Subjective/Complaints: No pain c/os, participates in PT/OT/SLP Review of Systems  Unable to perform ROS: mental acuity   Objective: Vital Signs: Blood pressure 141/70, pulse 64, temperature 98.2 F (36.8 C), temperature source Oral, resp. rate 18, height 5\' 8"  (1.727 m), weight 68.3 kg (150 lb 9.2 oz), SpO2 96.00%. No results found. Results for orders placed during the hospital encounter of 10/03/12 (from the past 72 hour(s))  GLUCOSE, CAPILLARY     Status: Abnormal   Collection Time   10/12/12 11:44 AM      Component Value Range Comment   Glucose-Capillary 247 (*) 70 - 99 mg/dL    Comment 1 Documented in Chart      Comment 2 Notify RN     GLUCOSE, CAPILLARY     Status: Normal   Collection Time   10/12/12  4:25 PM      Component Value Range Comment   Glucose-Capillary 99  70 - 99 mg/dL   GLUCOSE, CAPILLARY     Status: Abnormal   Collection Time   10/12/12  8:19 PM      Component Value Range Comment   Glucose-Capillary 255 (*) 70 - 99 mg/dL    Comment 1 Notify RN     GLUCOSE, CAPILLARY     Status: Abnormal   Collection Time   10/13/12 12:19 AM      Component Value Range Comment   Glucose-Capillary 242 (*) 70 - 99 mg/dL    Comment 1 Notify RN     GLUCOSE, CAPILLARY     Status: Abnormal   Collection Time   10/13/12  4:10 AM      Component Value Range Comment   Glucose-Capillary 251 (*) 70 - 99 mg/dL    Comment 1 Notify RN     PROTIME-INR     Status: Abnormal   Collection Time   10/13/12  6:40 AM      Component Value Range Comment   Prothrombin Time 23.8 (*) 11.6 - 15.2 seconds    INR 2.24 (*) 0.00 - 1.49   GLUCOSE, CAPILLARY     Status: Abnormal   Collection Time   10/13/12  7:51 AM      Component Value Range Comment   Glucose-Capillary 237 (*) 70 - 99 mg/dL    Comment 1 Notify RN     GLUCOSE, CAPILLARY     Status: Abnormal   Collection Time   10/13/12  4:27 PM      Component Value  Range Comment   Glucose-Capillary 184 (*) 70 - 99 mg/dL   GLUCOSE, CAPILLARY     Status: Abnormal   Collection Time   10/13/12  8:45 PM      Component Value Range Comment   Glucose-Capillary 249 (*) 70 - 99 mg/dL    Comment 1 Notify RN     GLUCOSE, CAPILLARY     Status: Abnormal   Collection Time   10/14/12 12:00 AM      Component Value Range Comment   Glucose-Capillary 242 (*) 70 - 99 mg/dL    Comment 1 Notify RN     GLUCOSE, CAPILLARY     Status: Abnormal   Collection Time   10/14/12  4:05 AM      Component Value Range Comment   Glucose-Capillary 230 (*) 70 - 99 mg/dL    Comment 1 Notify RN     GLUCOSE, CAPILLARY  Status: Abnormal   Collection Time   10/14/12  7:33 AM      Component Value Range Comment   Glucose-Capillary 232 (*) 70 - 99 mg/dL    Comment 1 Notify RN     GLUCOSE, CAPILLARY     Status: Abnormal   Collection Time   10/14/12 11:22 AM      Component Value Range Comment   Glucose-Capillary 253 (*) 70 - 99 mg/dL    Comment 1 Notify RN     GLUCOSE, CAPILLARY     Status: Abnormal   Collection Time   10/14/12  5:00 PM      Component Value Range Comment   Glucose-Capillary 139 (*) 70 - 99 mg/dL   GLUCOSE, CAPILLARY     Status: Abnormal   Collection Time   10/14/12  9:01 PM      Component Value Range Comment   Glucose-Capillary 218 (*) 70 - 99 mg/dL    Comment 1 Notify RN     GLUCOSE, CAPILLARY     Status: Abnormal   Collection Time   10/14/12 11:59 PM      Component Value Range Comment   Glucose-Capillary 138 (*) 70 - 99 mg/dL    Comment 1 Notify RN     GLUCOSE, CAPILLARY     Status: Abnormal   Collection Time   10/15/12  4:54 AM      Component Value Range Comment   Glucose-Capillary 130 (*) 70 - 99 mg/dL    Comment 1 Notify RN     PROTIME-INR     Status: Abnormal   Collection Time   10/15/12  7:10 AM      Component Value Range Comment   Prothrombin Time 22.2 (*) 11.6 - 15.2 seconds    INR 2.04 (*) 0.00 - 1.49   GLUCOSE, CAPILLARY     Status:  Normal   Collection Time   10/15/12  7:46 AM      Component Value Range Comment   Glucose-Capillary 99  70 - 99 mg/dL      HEENT: normal Cardio: irregular Resp: CTA B/L GI: BS positive Extremity:  Cyanosis Skin:   Breakdown R great toe tip ulcer, scab has been removed fresh granulation tissue without active bleeding Neuro: Lethargic, Abnormal Motor R side 3-/5 in UE and 3/5 in LE and Dysarthric Musc/Skel:  Other L BKA   Assessment/Plan: 1. Functional deficits secondary to L frontal infarct which require 3+ hours per day of interdisciplinary therapy in a comprehensive inpatient rehab setting. Physiatrist is providing close team supervision and 24 hour management of active medical problems listed below. Physiatrist and rehab team continue to assess barriers to discharge/monitor patient progress toward functional and medical goals. FIM: FIM - Bathing Bathing Steps Patient Completed: Chest;Right Arm;Abdomen;Front perineal area;Right upper leg;Left upper leg;Right lower leg (including foot) Bathing: 3: Mod-Patient completes 5-7 91f 10 parts or 50-74%  FIM - Upper Body Dressing/Undressing Upper body dressing/undressing steps patient completed: Thread/unthread right sleeve of pullover shirt/dresss;Thread/unthread left sleeve of pullover shirt/dress;Put head through opening of pull over shirt/dress;Pull shirt over trunk Upper body dressing/undressing: 5: Set-up assist to: Obtain clothing/put away FIM - Lower Body Dressing/Undressing Lower body dressing/undressing steps patient completed: Thread/unthread right underwear leg;Thread/unthread left underwear leg;Thread/unthread right pants leg;Thread/unthread left pants leg;Don/Doff right shoe;Fasten/unfasten right shoe Lower body dressing/undressing: 3: Mod-Patient completed 50-74% of tasks  FIM - Toileting Toileting steps completed by patient: Adjust clothing prior to toileting Toileting Assistive Devices: Grab bar or rail for  support Toileting:  2: Max-Patient completed 1 of 3 steps  FIM - Diplomatic Services operational officer Devices: Grab bars Toilet Transfers: 3-From toilet/BSC: Mod A (lift or lower assist);3-To toilet/BSC: Mod A (lift or lower assist)  FIM - Bed/Chair Transfer Bed/Chair Transfer Assistive Devices: HOB elevated;Arm rests;Bed rails Bed/Chair Transfer: 5: Supine > Sit: Supervision (verbal cues/safety issues);5: Sit > Supine: Supervision (verbal cues/safety issues);4: Bed > Chair or W/C: Min A (steadying Pt. > 75%);4: Chair or W/C > Bed: Min A (steadying Pt. > 75%)  FIM - Locomotion: Wheelchair Locomotion: Wheelchair: 1: Total Assistance/staff pushes wheelchair (Pt<25%) FIM - Locomotion: Ambulation Locomotion: Ambulation Assistive Devices: Designer, industrial/product Ambulation/Gait Assistance: 3: Mod assist Locomotion: Ambulation: 0: Activity did not occur  Comprehension Comprehension Mode: Auditory Comprehension: 5-Follows basic conversation/direction: With extra time/assistive device  Expression Expression Mode: Verbal Expression: 5-Expresses basic 90% of the time/requires cueing < 10% of the time.  Social Interaction Social Interaction: 5-Interacts appropriately 90% of the time - Needs monitoring or encouragement for participation or interaction.  Problem Solving Problem Solving: 3-Solves basic 50 - 74% of the time/requires cueing 25 - 49% of the time  Memory Memory: 3-Recognizes or recalls 50 - 74% of the time/requires cueing 25 - 49% of the time  Medical Problem List and Plan:  1. Left frontal lobe embolic infarct/recent left below-knee amputation 07/22/2012  2. DVT Prophylaxis/Anticoagulation: Chronic Coumadin therapy. Monitor for any bleeding episodes  3. Pain Management: Tylenol as needed. Monitor with increased mobility  4. Neuropsych: This patient is capable of making decisions on his/her own behalf.Home med was cymbalta will resume  5. Hypertension/atrial fibrillation.  Cardiac rate controlled. Continue Norvasc 10 mg daily, Lopressor 75 mg twice a day and Demadex 10 mg twice a day. Monitor with increased activity  6. Diabetes mellitus.uncontrolled  Hemoglobin A1c of 8.0.  Lantus insulin 27 units daily. Check blood sugars a.c. and at bedtime see discussion above. Cont current dose 7. Mild compression fractures T7 and T10 as well his chronic compression fractures with cemented vertebral augmentation T11, T12 and L1. No plans for a further vertebral plasty at this time and followup as outpatient unable to do this currently since he would need to be off warfarin -advise the use of a soft lumbar-thoracic corset  -lidoderm patches  8. Left BKA- well shaped. Continue stump shrinker.  -prosthesis at some point after dc once improved from a stroke and back standpoint. 9.  Toe lesion will order neosporin ointment and drsg changes, no further bleeding  LOS (Days) 12 A FACE TO FACE EVALUATION WAS PERFORMED  Brandolyn Shortridge E 10/15/2012, 8:14 AM

## 2012-10-15 NOTE — Progress Notes (Signed)
Speech Language Pathology Daily Session Note  Patient Details  Name: Aaron [name] MRN: [id number] Date of Birth: 05-07-1924  Today's Date: 10/15/2012 Time: 4010-2725 Time Calculation (min): 30 min  Short Term Goals: Week 2: SLP Short Term Goal 1 (Week 2): Patient will tolerate trials of thin liquids and regular textures without the use of a chin tuck with no overt s/s of aspiration with modified independence. SLP Short Term Goal 2 (Week 2): Patient will utilize speech intelligibility strategies during conversation with supervision level assist visual and verbal cues. SLP Short Term Goal 3 (Week 2): Patient will use external and internal memory aids to facilitate increased recall of new information with mod assist verbal cues.  Skilled Therapeutic Interventions: Treatment focus on speech, swallowing and cognitive goals. Pt participated in functional conversation with 100% intelligibility and was overall Mod I for utilization of strategies.  Pt required total A to recall the chin tuck swallowing compensatory strategy had been discharged and required Min verbal cues to utilize multiple swallows, however, pt did not demonstrate overt s/s of aspiration.  Pt also required Mod A question and semantic cues to utilize his schedule to recall morning therapy sessions.    FIM:  Comprehension Comprehension Mode: Auditory Comprehension: 5-Follows basic conversation/direction: With extra time/assistive device Expression Expression Mode: Verbal Expression: 5-Expresses basic needs/ideas: With extra time/assistive device Social Interaction Social Interaction: 5-Interacts appropriately 90% of the time - Needs monitoring or encouragement for participation or interaction. Problem Solving Problem Solving: 3-Solves basic 50 - 74% of the time/requires cueing 25 - 49% of the time Memory Memory: 3-Recognizes or recalls 50 - 74% of the time/requires cueing 25 - 49% of the time FIM - Eating Eating Activity: 5:  Supervision/cues  Pain Pain Assessment Pain Assessment: No/denies pain Pain Score: 0-No pain  Therapy/Group: Individual Therapy  Mashanda Ishibashi 10/15/2012, 12:05 PM

## 2012-10-15 NOTE — Progress Notes (Signed)
ANTICOAGULATION CONSULT NOTE - Follow Up Consult  Pharmacy Consult for coumadin  Indication: atrial fibrillation, secondary stroke prophylaxis  Patient Measurements: Height: 5\' 8"  (172.7 cm) Weight: 150 lb 9.2 oz (68.3 kg) IBW/kg (Calculated) : 68.4   Vital Signs: Temp: 98.2 F (36.8 C) (10/28 0500) Temp src: Oral (10/28 0500) BP: 131/71 mmHg (10/28 1158) Pulse Rate: 56  (10/28 1158)  Labs:  Basename 10/15/12 0710 10/13/12 0640  HGB -- --  HCT -- --  PLT -- --  APTT -- --  LABPROT 22.2* 23.8*  INR 2.04* 2.24*  HEPARINUNFRC -- --  CREATININE -- --  CKTOTAL -- --  CKMB -- --  TROPONINI -- --   Estimated Creatinine Clearance: 43 ml/min (by C-G formula based on Cr of 1.19).  Assessment: 78 y/o male patient with history of afib and stroke on coumadin PTA (2.5mg /day).  Patient was on Pradaxa but family prefers coumadin. INR today is therapeutic.  Goal of Therapy:  INR 2-3 Monitor platelets by anticoagulation protocol: Yes  Plan:  Continue warfarin 2.5mg  daily as at home.  Check INR 10/30 (M-W-F).  Estella Husk, Pharm.D., BCPS Clinical Pharmacist  Phone [phone] Pager [phone] 10/15/2012, 12:16 PM

## 2012-10-15 NOTE — Progress Notes (Signed)
Occupational Therapy Session Note  Patient Details  Name: Aaron [name] MRN: [id number] Date of Birth: 11/11/1924  Today's Date: 10/15/2012 Time: 4098-1191 Time Calculation (min): 55 min  Short Term Goals: Week 2:  OT Short Term Goal 1 (Week 2): Pt will complete LB bathing with min assist at sit to stand level  OT Short Term Goal 2 (Week 2): Pt will complete toileting (clothing management and hygiene) with mod assist  OT Short Term Goal 3 (Week 2): Pt will complete toilet transfer with min assist OT Short Term Goal 4 (Week 2): Pt will integrate RUE into self-care tasks at gross assist with min cues  Skilled Therapeutic Interventions/Progress Updates:    Pt seen for ADL retraining with focus on transfers, sit <> stand, and standing balance.  Pt continues to be fearful of standing without LUE support.  Completed bathing and dressing at sink, with pt requiring mod verbal cues and encouragement to participate and attempt activities in standing.  Pt initiated lateral leans to doff pants with decreased success and adamant requests for this therapist to pull his pants down.  Encouraged pt to attempt standing with BUE support on sink while this therapist doffed his pants.  Pt demonstrating increased participation in LB dressing, continuing to require assist to pull up and fasten pants secondary to increased fearfulness of falling.  Pt completed grooming in sitting with setup assist.  Therapy Documentation Precautions:  Precautions Precautions: Fall Precaution Comments: Lumbar corset to be worn OOB; LLE BKA, AFIB Required Braces or Orthoses: Spinal Brace Spinal Brace: Lumbar corset Restrictions Weight Bearing Restrictions: No Pain: Pain Assessment Pain Assessment: No/denies pain Pain Score: 0-No pain  See FIM for current functional status  Therapy/Group: Individual Therapy  [name] 10/15/2012, 9:26 AM

## 2012-10-15 NOTE — Progress Notes (Signed)
Physical Therapy Session Note  Patient Details  Name: Aaron [name] MRN: [id number] Date of Birth: 07/05/1924  Today's Date: 10/15/2012 Time: 1191-4782; 9562-1308  Time Calculation (min): 60 min; 45 min  Short Term Goals: Week 2:  PT Short Term Goal 1 (Week 2): Patient will perform bed mobility with HOB flat, no bed rails, and supervision to simulate home environment PT Short Term Goal 2 (Week 2): Patient will perform supine > sit transfers with HOB flat, no bed rails, and supervision PT Short Term Goal 3 (Week 2): Patient will perform squat pivot bed <> w/c transfer consistently with min A  PT Short Term Goal 4 (Week 2): Patient will demonstrate improvement in standing tolerance by standing for >5 min without rest break     Therapy Documentation Precautions:  Precautions Precautions: Back;Fall Precaution Comments: Lumbar corset to be worn OOB; LLE BKA, AFIB Required Braces or Orthoses: Spinal Brace Spinal Brace: Lumbar corset Restrictions Weight Bearing Restrictions: No   Vital Signs: Therapy Vitals Pulse Rate: 56  BP: 131/71 mmHg Patient Position, if appropriate: Lying Oxygen Therapy SpO2: 97 % O2 Device: None (Room air) Pain: Pain Assessment Pain Assessment: No/denies pain Pain Score: 0-No pain Other Treatments:   Patient sitting up in w/c upon arrival of PT. Patient propelled from room > PT gym (~180 feet) with supervision, requiring one rest break secondary to decreased activity tolerance. Patient performed w/c > mat transfer requiring mod assist for lowering. Attempted to have patient perform standing dynamic balance task at RW in form of reach for ring on L side of body with LUE and placing ring on target located on R side of body. Patient unable to reach across midline without losing balance. Task changed to have patient reach with LUE to high and low targets. Patient able to perform for ~3 min x 2 reps with mod to max assist to maintain balance, posture, and  safety. Patient c/o of stomach not feeling well after second rep. Patient performed sit > supine transfer with supervision and vitals were measured - BP: 131/71, HR: 56, O2: 97%. Patient reported feeling fine after short rest and wanting to continue. Patient performed supine > sit transfer with supervision - BP in sitting: 135/75. Patient performed standing balance at [facility] with R hand orthosis, while performing ball taps with LUE requiring mod to max A to maintain balance throughout exercise. Patient presented with Trendelenburg type stance and was unable to correct with verbal and tactile cues. Will address next visit with RLE strengthening exercises.  Patient performed sitting balance activity with cane in BUEs, reaching up toward target and holding position for 3 sec x 5 reps with min guard. Emphasis on sitting balance, postural control, and forward leaning. Patient required min vcs for initiation, sequencing, and posture. Progressed to standing at [facility] with hand orthosis on R, having patient reach high for ring with LUE and performing anterior lean to place on target while maintaining balance. Patient able to perform with min to mod assist to maintain balance and for safety.  Patient performed squat pivot transfer from mat > w/c requiring min A. Patient returned to room and left in w/c with call bell in reach.   Second session: Patient sitting in w/c without lumbar corset donned when PT arrived. [name] reported they had taken it off to go to the bathroom. Patient was brought down to gym by PT. Patient performed w/c> mat squat pivot transfer to the R with mod A. Patient continues to have difficulty pivoting on  RLE when performing transfer.  Patient performed sit <> supine transfers and rolling bilaterally with supervision in order for PT to assist patient in donning lumbar corset. Once lumbar corset was donned the following exercises and stretches were performed with emphasis on LE strengthening  and improving flexibility: R hip abd/ER in sidelying x 10 reps x 2 sets; passive R hip flexor stretch x 30 sec x 2 sets. Attempted hip extension in L sidelying, however patient was unable to perform with appropriate form.  In supine: resisted hip abd/add bilaterally x 10 reps x 2 sets; R heel slides with resisted knee ext x 10 reps; resisted R  ankle DF/PF x 10 reps x 2 sets; R hamstring stretch x 30 sec x 2 sets with contract release. Patient required multiple rest breaks throughout exercises and required min to mod vcs for form/technique.  Patient performed mat > w/c squat pivot transfer to the L with min A, requiring mod vcs to achieve sufficient anterior lean to perform safely.  Patient propelled ~50 feet in w/c from PT gym > nurses station prior to requiring rest break. PT pushed patient back to room. Patient transferred back to bed by performing squat-pivot transfer to R with mod A. Patient left lying in bed with call bell in reach, bed alarm on.    See FIM for current functional status  Therapy/Group: Individual Therapy  Margean Korell Hamilton DPT Student  10/15/2012, 11:59 AM

## 2012-10-15 NOTE — Progress Notes (Signed)
I have read and agree with the following treatment session.  Ameir Faria Hall, PT, DPT 

## 2012-10-16 ENCOUNTER — Inpatient Hospital Stay (HOSPITAL_COMMUNITY): Payer: Medicare Other | Admitting: Occupational Therapy

## 2012-10-16 ENCOUNTER — Inpatient Hospital Stay (HOSPITAL_COMMUNITY): Payer: Medicare Other | Admitting: Speech Pathology

## 2012-10-16 ENCOUNTER — Inpatient Hospital Stay (HOSPITAL_COMMUNITY): Payer: Medicare Other | Admitting: Physical Therapy

## 2012-10-16 DIAGNOSIS — I69991 Dysphagia following unspecified cerebrovascular disease: Secondary | ICD-10-CM

## 2012-10-16 DIAGNOSIS — I6330 Cerebral infarction due to thrombosis of unspecified cerebral artery: Secondary | ICD-10-CM

## 2012-10-16 DIAGNOSIS — G8110 Spastic hemiplegia affecting unspecified side: Secondary | ICD-10-CM

## 2012-10-16 LAB — GLUCOSE, CAPILLARY: Glucose-Capillary: 153 mg/dL — ABNORMAL HIGH (ref 70–99)

## 2012-10-16 NOTE — Progress Notes (Signed)
Speech Language Pathology Daily Session Note  Patient Details  Name: Aaron [name] MRN: [id number] Date of Birth: [birth year]  Today's Date: 10/16/2012 Time: 1130-1200 Time Calculation (min): 30 min  Short Term Goals: Week 2: SLP Short Term Goal 1 (Week 2): Patient will tolerate trials of thin liquids and regular textures without the use of a chin tuck with no overt s/s of aspiration with modified independence. SLP Short Term Goal 2 (Week 2): Patient will utilize speech intelligibility strategies during conversation with supervision level assist visual and verbal cues. SLP Short Term Goal 3 (Week 2): Patient will use external and internal memory aids to facilitate increased recall of new information with mod assist verbal cues.  Skilled Therapeutic Interventions: Treatment addressed speech and cognitive goals. Pt participated in functional conversation with 100% intelligibility and was overall Mod I for utilization of strategies. Pt also required Min A question and visual cues to utilize his schedule to recall morning therapy sessions.  SLP facilitated the session with min A faded to supervision cues during a card game that addressed basic problem solving.  Patient required Min A verbal cues to ask for set up assistance when lunch tray arrived.     FIM:  Comprehension Comprehension Mode: Auditory Comprehension: 5-Follows basic conversation/direction: With extra time/assistive device Expression Expression Mode: Verbal Expression: 5-Expresses basic needs/ideas: With extra time/assistive device Social Interaction Social Interaction: 5-Interacts appropriately 90% of the time - Needs monitoring or encouragement for participation or interaction. Problem Solving Problem Solving: 4-Solves basic 75 - 89% of the time/requires cueing 10 - 24% of the time Memory Memory: 3-Recognizes or recalls 50 - 74% of the time/requires cueing 25 - 49% of the time FIM - Eating Eating Activity: 5: Set-up assist  for cut food;5: Set-up assist for open containers  Pain Pain Assessment Pain Assessment: No/denies pain  Therapy/Group: Individual Therapy  Jackalyn Lombard, Conrad [place]  Graduate Clinician Speech Language Pathology  Zackaria Burkey, Joni Reining 10/16/2012, 1:34 PM

## 2012-10-16 NOTE — Progress Notes (Signed)
The skilled treatment note has been reviewed and SLP is in agreement. Krysti Hickling, M.A., CCC-SLP [phone]  

## 2012-10-16 NOTE — Progress Notes (Signed)
Occupational Therapy Session Note  Patient Details  Name: Aaron [name] MRN: [id number] Date of Birth: [birth year]  Today's Date: 10/16/2012 Time: 0830-0927 Time Calculation (min): 57 min  Short Term Goals: Week 2:  OT Short Term Goal 1 (Week 2): Pt will complete LB bathing with min assist at sit to stand level  OT Short Term Goal 2 (Week 2): Pt will complete toileting (clothing management and hygiene) with mod assist  OT Short Term Goal 3 (Week 2): Pt will complete toilet transfer with min assist OT Short Term Goal 4 (Week 2): Pt will integrate RUE into self-care tasks at gross assist with min cues  Skilled Therapeutic Interventions/Progress Updates:    Pt seen for ADL retraining with focus on functional transfers, sit <> stand, dynamic standing balance, and lateral leans with self-care tasks.  Pt finishing breakfast upon arrival at bed level.  Engaged in bed mobility with pt demonstrating increased initiation with moving from supine to sitting EOB.  Transfer to Rt from bed to w/c with pt required mod assist secondary to requiring repositioning of Rt foot for pivot.  Pt reports needing to toilet.  Mod assist again to toilet to Rt secondary to decreased Rt grasp and required assist to pivot Rt foot.  Pt demonstrated ability to doff pants and wipe with steadying support and use of grab bar.  Pt with improved sit <> stand this session, continuing to use BUE for support while this therapist pulled up his pants at sink level post bathing.  Therapy Documentation Precautions:  Precautions Precautions: Back;Fall Precaution Comments: Lumbar corset to be worn OOB; LLE BKA, AFIB Required Braces or Orthoses: Spinal Brace Spinal Brace: Lumbar corset Restrictions Weight Bearing Restrictions: No General:   Vital Signs: Therapy Vitals Temp: 98.1 F (36.7 C) Temp src: Oral Pulse Rate: 92  Resp: 18  BP: 140/74 mmHg Patient Position, if appropriate: Lying Oxygen Therapy SpO2: 97 % O2 Device:  None (Room air) Pain:  Pt with no c/o pain this session.  See FIM for current functional status  Therapy/Group: Individual Therapy  [name] 10/16/2012, 9:30 AM

## 2012-10-16 NOTE — Progress Notes (Signed)
I have read and agree with the following treatment session.  Gavina Dildine Hall, PT, DPT 

## 2012-10-16 NOTE — Progress Notes (Signed)
Patient ID: Aaron [name], male   DOB: August 22, 1924, 78 y.o.   MRN: [id number]   Subjective/Complaints: No pain c/os, participates in PT/OT/SLP Review of Systems  Unable to perform ROS: mental acuity   Objective: Vital Signs: Blood pressure 120/68, pulse 92, temperature 98.1 F (36.7 C), temperature source Oral, resp. rate 18, height 5\' 8"  (1.727 m), weight 68.3 kg (150 lb 9.2 oz), SpO2 97.00%. No results found. Results for orders placed during the hospital encounter of 10/03/12 (from the past 72 hour(s))  GLUCOSE, CAPILLARY     Status: Abnormal   Collection Time   10/13/12  4:27 PM      Component Value Range Comment   Glucose-Capillary 184 (*) 70 - 99 mg/dL   GLUCOSE, CAPILLARY     Status: Abnormal   Collection Time   10/13/12  8:45 PM      Component Value Range Comment   Glucose-Capillary 249 (*) 70 - 99 mg/dL    Comment 1 Notify RN     GLUCOSE, CAPILLARY     Status: Abnormal   Collection Time   10/14/12 12:00 AM      Component Value Range Comment   Glucose-Capillary 242 (*) 70 - 99 mg/dL    Comment 1 Notify RN     GLUCOSE, CAPILLARY     Status: Abnormal   Collection Time   10/14/12  4:05 AM      Component Value Range Comment   Glucose-Capillary 230 (*) 70 - 99 mg/dL    Comment 1 Notify RN     GLUCOSE, CAPILLARY     Status: Abnormal   Collection Time   10/14/12  7:33 AM      Component Value Range Comment   Glucose-Capillary 232 (*) 70 - 99 mg/dL    Comment 1 Notify RN     GLUCOSE, CAPILLARY     Status: Abnormal   Collection Time   10/14/12 11:22 AM      Component Value Range Comment   Glucose-Capillary 253 (*) 70 - 99 mg/dL    Comment 1 Notify RN     GLUCOSE, CAPILLARY     Status: Abnormal   Collection Time   10/14/12  5:00 PM      Component Value Range Comment   Glucose-Capillary 139 (*) 70 - 99 mg/dL   GLUCOSE, CAPILLARY     Status: Abnormal   Collection Time   10/14/12  9:01 PM      Component Value Range Comment   Glucose-Capillary 218 (*) 70 - 99 mg/dL    Comment 1 Notify RN     GLUCOSE, CAPILLARY     Status: Abnormal   Collection Time   10/14/12 11:59 PM      Component Value Range Comment   Glucose-Capillary 138 (*) 70 - 99 mg/dL    Comment 1 Notify RN     GLUCOSE, CAPILLARY     Status: Abnormal   Collection Time   10/15/12  4:54 AM      Component Value Range Comment   Glucose-Capillary 130 (*) 70 - 99 mg/dL    Comment 1 Notify RN     PROTIME-INR     Status: Abnormal   Collection Time   10/15/12  7:10 AM      Component Value Range Comment   Prothrombin Time 22.2 (*) 11.6 - 15.2 seconds    INR 2.04 (*) 0.00 - 1.49   GLUCOSE, CAPILLARY     Status: Normal   Collection Time   10/15/12  7:46 AM      Component Value Range Comment   Glucose-Capillary 99  70 - 99 mg/dL   GLUCOSE, CAPILLARY     Status: Abnormal   Collection Time   10/15/12 11:34 AM      Component Value Range Comment   Glucose-Capillary 145 (*) 70 - 99 mg/dL    Comment 1 Notify RN     GLUCOSE, CAPILLARY     Status: Abnormal   Collection Time   10/15/12  4:02 PM      Component Value Range Comment   Glucose-Capillary 106 (*) 70 - 99 mg/dL   GLUCOSE, CAPILLARY     Status: Abnormal   Collection Time   10/15/12  8:04 PM      Component Value Range Comment   Glucose-Capillary 131 (*) 70 - 99 mg/dL   GLUCOSE, CAPILLARY     Status: Abnormal   Collection Time   10/16/12 12:05 AM      Component Value Range Comment   Glucose-Capillary 136 (*) 70 - 99 mg/dL    Comment 1 Notify RN     GLUCOSE, CAPILLARY     Status: Abnormal   Collection Time   10/16/12  4:03 AM      Component Value Range Comment   Glucose-Capillary 151 (*) 70 - 99 mg/dL    Comment 1 Notify RN     GLUCOSE, CAPILLARY     Status: Abnormal   Collection Time   10/16/12  7:25 AM      Component Value Range Comment   Glucose-Capillary 128 (*) 70 - 99 mg/dL    Comment 1 Notify RN        HEENT: normal Cardio: irregular Resp: CTA B/L GI: BS positive Extremity:  Cyanosis Skin:   Breakdown R great toe tip  ulcer, scab has been removed fresh granulation tissue without active bleeding Neuro: Lethargic, Abnormal Motor, R hand strength improved R side 3-/5 in UE and 3/5 in LE and Dysarthric Musc/Skel:  Other L BKA   Assessment/Plan: 1. Functional deficits secondary to L frontal infarct which require 3+ hours per day of interdisciplinary therapy in a comprehensive inpatient rehab setting. Physiatrist is providing close team supervision and 24 hour management of active medical problems listed below. Physiatrist and rehab team continue to assess barriers to discharge/monitor patient progress toward functional and medical goals. FIM: FIM - Bathing Bathing Steps Patient Completed: Chest;Right Arm;Abdomen;Front perineal area;Right upper leg;Left upper leg;Right lower leg (including foot) Bathing: 3: Mod-Patient completes 5-7 3f 10 parts or 50-74%  FIM - Upper Body Dressing/Undressing Upper body dressing/undressing steps patient completed: Thread/unthread right sleeve of pullover shirt/dresss;Thread/unthread left sleeve of pullover shirt/dress;Put head through opening of pull over shirt/dress Upper body dressing/undressing: 4: Min-Patient completed 75 plus % of tasks FIM - Lower Body Dressing/Undressing Lower body dressing/undressing steps patient completed: Thread/unthread right underwear leg;Thread/unthread left underwear leg;Thread/unthread right pants leg;Thread/unthread left pants leg;Don/Doff right shoe Lower body dressing/undressing: 3: Mod-Patient completed 50-74% of tasks  FIM - Toileting Toileting steps completed by patient: Adjust clothing prior to toileting;Performs perineal hygiene Toileting Assistive Devices: Grab bar or rail for support Toileting: 3: Mod-Patient completed 2 of 3 steps  FIM - Diplomatic Services operational officer Devices: Grab bars Toilet Transfers: 3-From toilet/BSC: Mod A (lift or lower assist);3-To toilet/BSC: Mod A (lift or lower assist)  FIM - Bed/Chair  Transfer Bed/Chair Transfer Assistive Devices: HOB elevated;Arm rests;Bed rails Bed/Chair Transfer: 5: Supine > Sit: Supervision (verbal cues/safety issues);5: Sit > Supine: Supervision (verbal cues/safety  issues);4: Bed > Chair or W/C: Min A (steadying Pt. > 75%);3: Chair or W/C > Bed: Mod A (lift or lower assist)  FIM - Locomotion: Wheelchair Locomotion: Wheelchair: 5: Travels 150 ft or more: maneuvers on rugs and over door sills with supervision, cueing or coaxing FIM - Locomotion: Ambulation Locomotion: Ambulation Assistive Devices: Designer, industrial/product Ambulation/Gait Assistance: 3: Mod assist Locomotion: Ambulation: 0: Activity did not occur  Comprehension Comprehension Mode: Auditory Comprehension: 5-Follows basic conversation/direction: With no assist  Expression Expression Mode: Verbal Expression: 5-Expresses basic needs/ideas: With extra time/assistive device  Social Interaction Social Interaction: 5-Interacts appropriately 90% of the time - Needs monitoring or encouragement for participation or interaction.  Problem Solving Problem Solving: 4-Solves basic 75 - 89% of the time/requires cueing 10 - 24% of the time  Memory Memory: 3-Recognizes or recalls 50 - 74% of the time/requires cueing 25 - 49% of the time  Medical Problem List and Plan:  1. Left frontal lobe embolic infarct/recent left below-knee amputation 07/22/2012  2. DVT Prophylaxis/Anticoagulation: Chronic Coumadin therapy. Monitor for any bleeding episodes  3. Pain Management: Tylenol as needed. Monitor with increased mobility  4. Neuropsych: This patient is capable of making decisions on his/her own behalf.Home med was cymbalta will resume  5. Hypertension/atrial fibrillation. Cardiac rate controlled. Continue Norvasc 10 mg daily, Lopressor 75 mg twice a day and Demadex 10 mg twice a day. Monitor with increased activity  6. Diabetes mellitus.Controlled  Hemoglobin A1c of 8.0.  Lantus insulin 27 units daily. Check  blood sugars a.c. and at bedtime see discussion above. Cont current dose 7. Mild compression fractures T7 and T10 as well his chronic compression fractures with cemented vertebral augmentation T11, T12 and L1. No plans for a further vertebral plasty at this time and followup as outpatient unable to do this currently since he would need to be off warfarin -advise the use of a soft lumbar-thoracic corset  -lidoderm patches  8. Left BKA- well shaped. Continue stump shrinker.  -prosthesis at some point after dc once improved from a stroke and back standpoint. 9.  Toe lesion will order neosporin ointment and drsg changes, no further bleeding  LOS (Days) 13 A FACE TO FACE EVALUATION WAS PERFORMED  KIRSTEINS,ANDREW E 10/16/2012, 8:06 AM

## 2012-10-16 NOTE — Progress Notes (Signed)
Physical Therapy Session Note  Patient Details  Name: Aaron [name] MRN: [id number] Date of Birth: [birth year]  Today's Date: 10/16/2012 Time: 1000-1100; 1330-1415 Time Calculation (min): 60 min; 45 min  Short Term Goals: Week 2:  PT Short Term Goal 1 (Week 2): Patient will perform bed mobility with HOB flat, no bed rails, and supervision to simulate home environment PT Short Term Goal 2 (Week 2): Patient will perform supine > sit transfers with HOB flat, no bed rails, and supervision PT Short Term Goal 3 (Week 2): Patient will perform squat pivot bed <> w/c transfer consistently with min A  PT Short Term Goal 4 (Week 2): Patient will demonstrate improvement in standing tolerance by standing for >5 min without rest break     Therapy Documentation Precautions:  Precautions Precautions: Back Precaution Comments: Lumbar corset to be worn OOB; LLE BKA, AFIB Required Braces or Orthoses: Spinal Brace Spinal Brace: Lumbar corset Restrictions Weight Bearing Restrictions: No   Vital Signs: Therapy Vitals BP: 140/74 mmHg Patient Position, if appropriate: Sitting Pain: Pain Assessment Pain Assessment: No/denies pain Pain Score: 0-No pain  Other Treatments:  Patient sitting in w/c upon arrival of PT. Patient propelled from room to PT gym (~180 feet) with LUE and RLE with supervision. Patient required supervision for safety. Patient performed squat pivot transfer from w/c > mat to the R with mod A. Patient continued to present with increased difficulty pivoting on RLE secondary to being unable to shifting weight anteriorly. Attempted to have patient push forward with RUE on therapists shoulder while performing anterior lean with emphasis on getting weight forward and on ball of foot to assist with pivoting. Patient required verbal and tactile cues of two helpers to perform, however still not able to perform pivot easily. Task changed to performing push ups while pivoting hips to the R with  push up handles x 10 reps. Patient required verbal and tactile cues for form and safety; required rest break secondary to decreased activity tolerance afterwards. Exercises followed up by having patient perform mat <> w/c squat pivot transfer requiring min to mod assist. Patient able to pivot RLE effectively, making transfer safer and requiring less assist.  Patient performed stand pivot transfer from mat <> w/c with RW and mod assist for lowering assist, AD management, and safety. Required vcs for sequencing, safety. Patient returned to room in w/c and left with call bell in reach.   Second session: Patient sitting up in w/c upon PT arrival. Patient taken to day room where patient performed w/c propulsion (~100 feet) on carpet while navigating around obstacles and while carrying objects. Patient able to perform with relative ease, requiring supervision and min vcs for problem solving. Patient performed stand pivot transfer from w/c < > mat with RW and mod A for AD management and safety. Patient requires increased time to perform, but carry over was evident from this AMs session.  Patient performed the following LE exercises on RLE in supine x 10 reps x 2 sets with emphasis on LE strengthening to assist with standing tolerance, balance, and eventually gait: Hip abd/ER with green theraband; Glut squeezes with 3 sec hold; heel slides with resisted hip and knee extension, R resisted ankle DF and PF; Passive R hamstring stretch x 30 sec x 2 sets with contract/release.  Patient returned to room in w/c. Patient performed squat pivot transfer to the L from w/c > bed with mod assist secondary to patient wanting to stand up. Patient performed bilateral rolling with supervision  in order for lumbar corset to be adjusted. Patient lying in bed at end of session with call bell in reach and bed alarm on.    See FIM for current functional status  Therapy/Group: Individual Therapy  Tracie Dore Hamilton DPT  Student  10/16/2012, 12:21 PM

## 2012-10-17 ENCOUNTER — Inpatient Hospital Stay (HOSPITAL_COMMUNITY): Payer: Medicare Other | Admitting: Occupational Therapy

## 2012-10-17 ENCOUNTER — Inpatient Hospital Stay (HOSPITAL_COMMUNITY): Payer: Medicare Other | Admitting: *Deleted

## 2012-10-17 ENCOUNTER — Inpatient Hospital Stay (HOSPITAL_COMMUNITY): Payer: Medicare Other | Admitting: Physical Therapy

## 2012-10-17 LAB — PROTIME-INR
INR: 1.99 — ABNORMAL HIGH (ref 0.00–1.49)
Prothrombin Time: 21.8 seconds — ABNORMAL HIGH (ref 11.6–15.2)

## 2012-10-17 LAB — GLUCOSE, CAPILLARY
Glucose-Capillary: 324 mg/dL — ABNORMAL HIGH (ref 70–99)
Glucose-Capillary: 342 mg/dL — ABNORMAL HIGH (ref 70–99)
Glucose-Capillary: 234 mg/dL — ABNORMAL HIGH (ref 70–99)
Glucose-Capillary: 44 mg/dL — CL (ref 70–99)

## 2012-10-17 MED ORDER — INFLUENZA VIRUS VACC SPLIT PF IM SUSP: 0.5000 mL | INTRAMUSCULAR | Status: DC

## 2012-10-17 MED ORDER — INSULIN ASPART 100 UNIT/ML ~~LOC~~ SOLN
3.0000 [IU] | Freq: Three times a day (TID) | SUBCUTANEOUS | Status: DC
  Administered 2012-10-17: 3 [IU] via SUBCUTANEOUS

## 2012-10-17 NOTE — Significant Event (Signed)
Hypoglycemic Event  CBG: 44  Treatment: 15 GM carbohydrate snack  Symptoms: None  Follow-up CBG: Time:1720 CBG Result:75  Possible Reasons for Event: Unknown  Comments/MD notified:no    Aaron [name]  Remember to initiate Hypoglycemia Order Set & complete

## 2012-10-17 NOTE — Progress Notes (Signed)
Patient ID: Aaron [name], male   DOB: Aug 19, 1924, 78 y.o.   MRN: [id number]   Subjective/Complaints: No pain c/os, participates in PT/OT/SLP Review of Systems  Unable to perform ROS: mental acuity   Objective: Vital Signs: Blood pressure 128/78, pulse 69, temperature 98.4 F (36.9 C), temperature source Oral, resp. rate 17, height 5\' 8"  (1.727 m), weight 68.3 kg (150 lb 9.2 oz), SpO2 96.00%. No results found. Results for orders placed during the hospital encounter of 10/03/12 (from the past 72 hour(s))  GLUCOSE, CAPILLARY     Status: Abnormal   Collection Time   10/14/12 11:22 AM      Component Value Range Comment   Glucose-Capillary 253 (*) 70 - 99 mg/dL    Comment 1 Notify RN     GLUCOSE, CAPILLARY     Status: Abnormal   Collection Time   10/14/12  5:00 PM      Component Value Range Comment   Glucose-Capillary 139 (*) 70 - 99 mg/dL   GLUCOSE, CAPILLARY     Status: Abnormal   Collection Time   10/14/12  9:01 PM      Component Value Range Comment   Glucose-Capillary 218 (*) 70 - 99 mg/dL    Comment 1 Notify RN     GLUCOSE, CAPILLARY     Status: Abnormal   Collection Time   10/14/12 11:59 PM      Component Value Range Comment   Glucose-Capillary 138 (*) 70 - 99 mg/dL    Comment 1 Notify RN     GLUCOSE, CAPILLARY     Status: Abnormal   Collection Time   10/15/12  4:54 AM      Component Value Range Comment   Glucose-Capillary 130 (*) 70 - 99 mg/dL    Comment 1 Notify RN     PROTIME-INR     Status: Abnormal   Collection Time   10/15/12  7:10 AM      Component Value Range Comment   Prothrombin Time 22.2 (*) 11.6 - 15.2 seconds    INR 2.04 (*) 0.00 - 1.49   GLUCOSE, CAPILLARY     Status: Normal   Collection Time   10/15/12  7:46 AM      Component Value Range Comment   Glucose-Capillary 99  70 - 99 mg/dL   GLUCOSE, CAPILLARY     Status: Abnormal   Collection Time   10/15/12 11:34 AM      Component Value Range Comment   Glucose-Capillary 145 (*) 70 - 99 mg/dL    Comment 1 Notify RN     GLUCOSE, CAPILLARY     Status: Abnormal   Collection Time   10/15/12  4:02 PM      Component Value Range Comment   Glucose-Capillary 106 (*) 70 - 99 mg/dL   GLUCOSE, CAPILLARY     Status: Abnormal   Collection Time   10/15/12  8:04 PM      Component Value Range Comment   Glucose-Capillary 131 (*) 70 - 99 mg/dL   GLUCOSE, CAPILLARY     Status: Abnormal   Collection Time   10/16/12 12:05 AM      Component Value Range Comment   Glucose-Capillary 136 (*) 70 - 99 mg/dL    Comment 1 Notify RN     GLUCOSE, CAPILLARY     Status: Abnormal   Collection Time   10/16/12  4:03 AM      Component Value Range Comment   Glucose-Capillary 151 (*) 70 - 99  mg/dL    Comment 1 Notify RN     GLUCOSE, CAPILLARY     Status: Abnormal   Collection Time   10/16/12  7:25 AM      Component Value Range Comment   Glucose-Capillary 128 (*) 70 - 99 mg/dL    Comment 1 Notify RN     GLUCOSE, CAPILLARY     Status: Abnormal   Collection Time   10/16/12 11:35 AM      Component Value Range Comment   Glucose-Capillary 153 (*) 70 - 99 mg/dL    Comment 1 Notify RN     GLUCOSE, CAPILLARY     Status: Normal   Collection Time   10/16/12  4:21 PM      Component Value Range Comment   Glucose-Capillary 95  70 - 99 mg/dL   GLUCOSE, CAPILLARY     Status: Abnormal   Collection Time   10/16/12  8:03 PM      Component Value Range Comment   Glucose-Capillary 178 (*) 70 - 99 mg/dL   GLUCOSE, CAPILLARY     Status: Abnormal   Collection Time   10/17/12  1:55 AM      Component Value Range Comment   Glucose-Capillary 324 (*) 70 - 99 mg/dL   GLUCOSE, CAPILLARY     Status: Abnormal   Collection Time   10/17/12  1:58 AM      Component Value Range Comment   Glucose-Capillary 345 (*) 70 - 99 mg/dL   GLUCOSE, CAPILLARY     Status: Abnormal   Collection Time   10/17/12  4:18 AM      Component Value Range Comment   Glucose-Capillary 270 (*) 70 - 99 mg/dL    Comment 1 Notify RN     PROTIME-INR      Status: Abnormal   Collection Time   10/17/12  6:10 AM      Component Value Range Comment   Prothrombin Time 21.8 (*) 11.6 - 15.2 seconds    INR 1.99 (*) 0.00 - 1.49      HEENT: normal Cardio: irregular Resp: CTA B/L GI: BS positive Extremity:  Cyanosis Skin:   Breakdown R great toe tip ulcer, scab has been removed fresh granulation tissue without active bleeding Neuro: Lethargic, Abnormal Motor, R hand strength improved 2- grip, 3- WE R side 3-/5 in UE and 3/5 in LE and Dysarthric Musc/Skel:  Other L BKA, R hand Dupuytrens contracture 5th digit   Assessment/Plan: 1. Functional deficits secondary to L frontal infarct which require 3+ hours per day of interdisciplinary therapy in a comprehensive inpatient rehab setting. Physiatrist is providing close team supervision and 24 hour management of active medical problems listed below. Physiatrist and rehab team continue to assess barriers to discharge/monitor patient progress toward functional and medical goals. FIM: FIM - Bathing Bathing Steps Patient Completed: Chest;Right Arm;Abdomen;Front perineal area;Right upper leg;Left upper leg;Right lower leg (including foot) Bathing: 3: Mod-Patient completes 5-7 21f 10 parts or 50-74%  FIM - Upper Body Dressing/Undressing Upper body dressing/undressing steps patient completed: Thread/unthread right sleeve of pullover shirt/dresss;Thread/unthread left sleeve of pullover shirt/dress;Put head through opening of pull over shirt/dress;Pull shirt over trunk Upper body dressing/undressing: 5: Set-up assist to: Obtain clothing/put away FIM - Lower Body Dressing/Undressing Lower body dressing/undressing steps patient completed: Thread/unthread right underwear leg;Thread/unthread left underwear leg;Thread/unthread right pants leg;Thread/unthread left pants leg Lower body dressing/undressing: 3: Mod-Patient completed 50-74% of tasks  FIM - Toileting Toileting steps completed by patient: Performs perineal  hygiene;Adjust clothing  prior to toileting Toileting Assistive Devices: Grab bar or rail for support Toileting: 3: Mod-Patient completed 2 of 3 steps  FIM - Diplomatic Services operational officer Devices: Elevated toilet seat;Grab bars Toilet Transfers: 3-To toilet/BSC: Mod A (lift or lower assist);4-From toilet/BSC: Min A (steadying Pt. > 75%)  FIM - Bed/Chair Transfer Bed/Chair Transfer Assistive Devices: Bed rails;Arm rests Bed/Chair Transfer: 3: Bed > Chair or W/C: Mod A (lift or lower assist);3: Chair or W/C > Bed: Mod A (lift or lower assist)  FIM - Locomotion: Wheelchair Locomotion: Wheelchair: 5: Travels 150 ft or more: maneuvers on rugs and over door sills with supervision, cueing or coaxing FIM - Locomotion: Ambulation Locomotion: Ambulation Assistive Devices: Designer, industrial/product Ambulation/Gait Assistance: 3: Mod assist Locomotion: Ambulation: 0: Activity did not occur  Comprehension Comprehension Mode: Auditory Comprehension: 5-Follows basic conversation/direction: With extra time/assistive device  Expression Expression Mode: Verbal Expression: 5-Expresses basic needs/ideas: With extra time/assistive device  Social Interaction Social Interaction: 5-Interacts appropriately 90% of the time - Needs monitoring or encouragement for participation or interaction.  Problem Solving Problem Solving Mode: Asleep Problem Solving: 4-Solves basic 75 - 89% of the time/requires cueing 10 - 24% of the time  Memory Memory: 3-Recognizes or recalls 50 - 74% of the time/requires cueing 25 - 49% of the time  Medical Problem List and Plan:  1. Left frontal lobe embolic infarct/recent left below-knee amputation 07/22/2012  2. DVT Prophylaxis/Anticoagulation: Chronic Coumadin therapy. Monitor for any bleeding episodes  3. Pain Management: Tylenol as needed. Monitor with increased mobility  4. Neuropsych: This patient is capable of making decisions on his/her own behalf.Home med was  cymbalta will resume  5. Hypertension/atrial fibrillation. Cardiac rate controlled. Continue Norvasc 10 mg daily, Lopressor 75 mg twice a day and Demadex 10 mg twice a day. Monitor with increased activity  6. Diabetes mellitus.Controlled  Hemoglobin A1c of 8.0.  Lantus insulin 27 units daily. Check blood sugars a.c. and at bedtime see discussion above. Cont current dose 7. Mild compression fractures T7 and T10 as well his chronic compression fractures with cemented vertebral augmentation T11, T12 and L1. No plans for a further vertebral plasty at this time and followup as outpatient unable to do this currently since he would need to be off warfarin -advise the use of a soft lumbar-thoracic corset  -lidoderm patches  8. Left BKA- well shaped. Continue stump shrinker.  -prosthesis at some point after dc once improved from a stroke and back standpoint. 9.  Toe lesion will order neosporin ointment and drsg changes, no further bleeding  LOS (Days) 14 A FACE TO FACE EVALUATION WAS PERFORMED  KIRSTEINS,ANDREW E 10/17/2012, 8:44 AM

## 2012-10-17 NOTE — Progress Notes (Signed)
Physical Therapy Session Note  Patient Details  Name: Aaron [name] MRN: [id number] Date of Birth: 28-Jan-1924  Today's Date: 10/17/2012 Time: 1104-1200 Time Calculation (min): 56 min  Short Term Goals: Week 2:  PT Short Term Goal 1 (Week 2): Patient will perform bed mobility with HOB flat, no bed rails, and supervision to simulate home environment PT Short Term Goal 2 (Week 2): Patient will perform supine > sit transfers with HOB flat, no bed rails, and supervision PT Short Term Goal 3 (Week 2): Patient will perform squat pivot bed <> w/c transfer consistently with min A  PT Short Term Goal 4 (Week 2): Patient will demonstrate improvement in standing tolerance by standing for >5 min without rest break  Skilled Therapeutic Interventions/Progress Updates:  Pt participated in group exercise program of L LE BKA exercises and R LE strengthening exercises x 20 reps x 2, AROM with min verbal cues for proper technique.  Performed w/c push ups x 10 x 2 reps.  Propels w/c with BUE's and RLE x 150 and supervision.  Min assist/guard for squat-pivot tranfers w/c-mat-w/c.     Therapy Documentation Precautions:  Precautions Precautions: Back Precaution Comments: Lumbar corset to be worn OOB; LLE BKA, AFIB Required Braces or Orthoses: Spinal Brace Spinal Brace: Lumbar corset Restrictions Weight Bearing Restrictions: No    Pain:  Minimal c/o LBP with moving to supine.  Easied off with increased time per pt.      Locomotion : Wheelchair Mobility Distance: 150   See FIM for current functional status  Therapy/Group: Group Therapy  Newell Coral 10/17/2012, 12:40 PM

## 2012-10-17 NOTE — Progress Notes (Signed)
Occupational Therapy Session Note  Patient Details  Name: Aaron [name] MRN: [id number] Date of Birth: 1924/03/12  Today's Date: 10/17/2012 Time: 0930-1015 Time Calculation (min): 45 min  Short Term Goals: Week 1:  OT Short Term Goal 1 (Week 1): Pt will complete LB bathing with min assist at sit to stand level OT Short Term Goal 1 - Progress (Week 1): Progressing toward goal OT Short Term Goal 2 (Week 1): Pt will complete LB dressing with mod assist OT Short Term Goal 2 - Progress (Week 1): Met OT Short Term Goal 3 (Week 1): Pt will complete toilet transfers with mod assist OT Short Term Goal 3 - Progress (Week 1): Met OT Short Term Goal 4 (Week 1): Pt will complete toileting (clothing management and hygiene) with mod assist OT Short Term Goal 4 - Progress (Week 1): Progressing toward goal Week 2:  OT Short Term Goal 1 (Week 2): Pt will complete LB bathing with min assist at sit to stand level  OT Short Term Goal 2 (Week 2): Pt will complete toileting (clothing management and hygiene) with mod assist  OT Short Term Goal 3 (Week 2): Pt will complete toilet transfer with min assist OT Short Term Goal 4 (Week 2): Pt will integrate RUE into self-care tasks at gross assist with min cues  Skilled Therapeutic Interventions/Progress Updates:  Patient resting in bed upon arrival.  With encouragement patient participated in self care retraining to include sponge bath, dressing and grooming at sink.  Focus session on activity tolerance, slow and controlled stand to sits, initiated use of RUE 3 times during BADL tasks after prompting first few times.   Therapy Documentation Precautions:  Precautions Precautions: Fall Precaution Comments: Lumbar corset to be worn OOB; LLE BKA, AFIB Required Braces or Orthoses: Spinal Brace Spinal Brace: Lumbar corset Restrictions Weight Bearing Restrictions: No Pain: No c/o pain  See FIM for current functional status  Therapy/Group: Individual  Therapy  Williams Dietrick 10/17/2012, 5:23 PM

## 2012-10-17 NOTE — Progress Notes (Signed)
ANTICOAGULATION CONSULT NOTE - Follow Up Consult  Pharmacy Consult for coumadin  Indication: atrial fibrillation, secondary stroke prophylaxis  Patient Measurements: Height: 5\' 8"  (172.7 cm) Weight: 150 lb 9.2 oz (68.3 kg) IBW/kg (Calculated) : 68.4   Vital Signs: Temp: 98.4 F (36.9 C) (10/30 0627) Temp src: Oral (10/30 0627) BP: 128/78 mmHg (10/30 0627) Pulse Rate: 69  (10/30 0627)  Labs:  Basename 10/17/12 0610 10/15/12 0710  HGB -- --  HCT -- --  PLT -- --  APTT -- --  LABPROT 21.8* 22.2*  INR 1.99* 2.04*  HEPARINUNFRC -- --  CREATININE -- --  CKTOTAL -- --  CKMB -- --  TROPONINI -- --   Estimated Creatinine Clearance: 43 ml/min (by C-G formula based on Cr of 1.19).  Assessment: 78 y/o male patient with history of afib and stroke on coumadin PTA (2.5mg /day).  Patient was on Pradaxa but family prefers coumadin. INR today is essentially therapeutic at 1.99.  Goal of Therapy:  INR 2-3 Monitor platelets by anticoagulation protocol: Yes  Plan:  Continue warfarin 2.5mg  daily as at home.  Check INR 11/1 (M-W-F).  Estella Husk, Pharm.D., BCPS Clinical Pharmacist  Phone [phone] Pager [phone] 10/17/2012, 12:32 PM

## 2012-10-17 NOTE — Progress Notes (Signed)
I have read and agree with the following treatment session.  Russel Morain Hall, PT, DPT 

## 2012-10-17 NOTE — Progress Notes (Signed)
Occupational Therapy Session Note  Patient Details  Name: Aaron [name] MRN: [id number] Date of Birth: 05-15-1924  Today's Date: 10/17/2012 Time: 1430-1500 Time Calculation (min): 30 min  Short Term Goals: Week 2:  OT Short Term Goal 1 (Week 2): Pt will complete LB bathing with min assist at sit to stand level  OT Short Term Goal 2 (Week 2): Pt will complete toileting (clothing management and hygiene) with mod assist  OT Short Term Goal 3 (Week 2): Pt will complete toilet transfer with min assist OT Short Term Goal 4 (Week 2): Pt will integrate RUE into self-care tasks at gross assist with min cues  Skilled Therapeutic Interventions/Progress Updates:    Pt seen for 1:1 OT with focus on NM re-ed with PROM progressing to A/AROM in Rt hand.  Retrograde massage conducted to decrease edema and promote increased ROM.  Pt demonstrated increased active flexion post stretching and massage in all digits and intermittent extension in 4th digit.  Educated pt on simple PROM and A/AROM to conduct in down time.    Therapy Documentation Precautions:  Precautions Precautions: Fall Precaution Comments: Lumbar corset to be worn OOB; LLE BKA, AFIB Required Braces or Orthoses: Spinal Brace Spinal Brace: Lumbar corset Restrictions Weight Bearing Restrictions: No General:   Vital Signs: Therapy Vitals Temp: 98 F (36.7 C) Temp src: Oral Pulse Rate: 58  Resp: 17  BP: 126/66 mmHg Patient Position, if appropriate: Lying Oxygen Therapy SpO2: 96 % O2 Device: None (Room air) Pain: Pain Assessment Pain Assessment: Faces Faces Pain Scale: Hurts little more Pain Type: Acute pain Pain Location: Back Pain Orientation: Lower Pain Descriptors: Discomfort;Aching Pain Onset: Gradual Patients Stated Pain Goal: 2 Pain Intervention(s): Rest  See FIM for current functional status  Therapy/Group: Individual Therapy  [name] 10/17/2012, 3:23 PM

## 2012-10-17 NOTE — Progress Notes (Signed)
Physical Therapy Weekly Progress Note  Patient Details  Name: Aaron [name] MRN: [id number] Date of Birth: Nov 05, 1924  Today's Date: 10/30/20132 Time: 1300-1400 Time Calculation (min): 60 min  Patient has met 3 of 4 short term goals.  Patient continues to need varying assists when performing sit <> stand transfers. Will continue to practice in hopes of meeting goal of consistently performing sit <> stand transfer with min A.  Patient continues to demonstrate the following deficits: decreased strength, decreased activity tolerance, decreased postural control, impaired balance and coordination and therefore will continue to benefit from skilled PT intervention to enhance overall performance with activity tolerance, balance, postural control, ability to compensate for deficits, functional use of  right upper extremity and right lower extremity, awareness and coordination.  Patient progressing toward long term goals..  Continue plan of care.  PT Short Term Goals Week 1:  PT Short Term Goal 1 (Week 1): Patient will perform bed mobility without use of rails and HOB flat with supervision. PT Short Term Goal 1 - Progress (Week 1): Met PT Short Term Goal 2 (Week 1): Patient will perform supine > sit transfer with supervision. PT Short Term Goal 2 - Progress (Week 1): Met PT Short Term Goal 3 (Week 1): Patient will perform sit to stand transfer with UE support and max assist PT Short Term Goal 3 - Progress (Week 1): Met PT Short Term Goal 4 (Week 1): Patient will propel w/c 50 feet with RLE and LUE with supervision PT Short Term Goal 4 - Progress (Week 1): Met Week 2:  PT Short Term Goal 1 (Week 2): Patient will perform bed mobility with HOB flat, no bed rails, and supervision to simulate home environment PT Short Term Goal 1 - Progress (Week 2): Met PT Short Term Goal 2 (Week 2): Patient will perform supine > sit transfers with HOB flat, no bed rails, and supervision PT Short Term Goal 2 - Progress  (Week 2): Met PT Short Term Goal 3 (Week 2): Patient will perform squat pivot bed <> w/c transfer consistently with min A  PT Short Term Goal 3 - Progress (Week 2): Progressing toward goal PT Short Term Goal 4 (Week 2): Patient will demonstrate improvement in standing tolerance by standing for >5 min without rest break PT Short Term Goal 4 - Progress (Week 2): Met      Therapy Documentation Precautions:  Precautions Precautions: Fall Precaution Comments: Lumbar corset to be worn OOB; LLE BKA, AFIB Required Braces or Orthoses: Spinal Brace Spinal Brace: Lumbar corset Restrictions Weight Bearing Restrictions: No   Pain: Pain Assessment Pain Assessment: Faces Faces Pain Scale: Hurts little more Pain Type: Acute pain Pain Location: Back Pain Orientation: Lower Pain Descriptors: Discomfort;Aching Pain Onset: Gradual Patients Stated Pain Goal: 2 Pain Intervention(s): Rest   Locomotion : Wheelchair Mobility Distance: 150 feet    Other Treatments:  Patient sitting in w/c upon PT arrival. Patient propelled ~150 feet toward small PT gym using RUE and LLE and supervision and one rest break; PT assisted rest of the way. Patient performed car transfer using stand pivot with RW and mod assist for lowering, steadying, and safety. Patient required mod vcs for technique, sequencing and safety. Patient demonstrated ability to bring BLEs into and out of car with supervision. Performed car > w/c stand pivot transfer with RW and min A.  Patient performed sitting reach task on dyna-disc with emphasis on postural control, dynamic sitting balance, and activity tolerance. Patient instructed to perform anterior lean to  grab ring and cross midline to place on varying targets. Task performed using BUEs. Patient continues to have poor sitting posture and increased difficulty bringing weight forward secondary to being unable to achieve anterior pelvic tilt. Patient showed some progress during session with  being able to self correct posture and perform anterior lean with upright posture. Patient performed anterior leans with UEs at 90 deg shoulder flexion while holding cane to encourage dynamic postural control and simulate initial step of transfers. Patient able to perform 6 reps prior to needing rest.  Patient transferred from mat > w/c using squat pivot transfer with min A. Patient propelled w/c ~180 feet back to room with supervision. Patient performed stand pivot transfer from w/c to bed with min A. Sit > supine transfer with supervision. Patient left in bed with call bell in reach and daughter at bedside.  See FIM for current functional status  Therapy/Group: Individual Therapy  Anette Barra Hamilton DPT Student 10/17/2012, 2:27 PM

## 2012-10-18 ENCOUNTER — Inpatient Hospital Stay (HOSPITAL_COMMUNITY): Payer: Medicare Other | Admitting: *Deleted

## 2012-10-18 ENCOUNTER — Inpatient Hospital Stay (HOSPITAL_COMMUNITY): Payer: Medicare Other | Admitting: Speech Pathology

## 2012-10-18 ENCOUNTER — Inpatient Hospital Stay (HOSPITAL_COMMUNITY): Payer: Medicare Other | Admitting: Occupational Therapy

## 2012-10-18 LAB — GLUCOSE, CAPILLARY
Glucose-Capillary: 232 mg/dL — ABNORMAL HIGH (ref 70–99)
Glucose-Capillary: 247 mg/dL — ABNORMAL HIGH (ref 70–99)

## 2012-10-18 NOTE — Progress Notes (Signed)
Occupational Therapy Session Note  Patient Details  Name: Aaron [name] MRN: [id number] Date of Birth: 03-Apr-1924  Today's Date: 10/18/2012 Time: 0800-1020 Time Calculation (min): 140 min  Short Term Goals: Week 2:  OT Short Term Goal 1 (Week 2): Pt will complete LB bathing with min assist at sit to stand level  OT Short Term Goal 2 (Week 2): Pt will complete toileting (clothing management and hygiene) with mod assist  OT Short Term Goal 3 (Week 2): Pt will complete toilet transfer with min assist OT Short Term Goal 4 (Week 2): Pt will integrate RUE into self-care tasks at gross assist with min cues  Skilled Therapeutic Interventions/Progress Updates:    Worked on bathing and dressing sit to stand from the wheelchair.  Pt only needed min assist for initial sit to stand but overall mod assist for dynamic standing balance when attempting to wash his periarea and bottom.  Pt needs mod assist to incorporate the RUE into functional tasks, such as washing the  LUE.  Also continues to need max instructional cueing to try activities at times.  Likes to try and get more help than he actually needs.  Utilized stool to prop the RLE on for donning his sock and shoe.  Second part of session, took pt down to the gym and fabricated a resting hand splint for night time wear on the RUE.  Attempted to involve the fifth digit in the splint however secondary to contracture and adduction at resting ws forced to leave it out of the splint secondary to probable skin issues.  Placed sign in room and discussed use of the splint only at night with both pt and nursing.  At this time pt will need assistance to fit the splint correctly.    Therapy Documentation Precautions:  Precautions Precautions: Fall Precaution Comments: Lumbar corset to be worn OOB; LLE BKA, AFIB Required Braces or Orthoses: Spinal Brace Spinal Brace: Lumbar corset Restrictions Weight Bearing Restrictions: No  Pain: Pain Assessment Pain  Assessment: Faces Faces Pain Scale: Hurts a little bit Pain Type: Acute pain Pain Location: Back Pain Intervention(s): Repositioned;Distraction ADL: See FIM for current functional status  Therapy/Group: Individual Therapy  Cordel Drewes OTR/L 10/18/2012, 10:35 AM

## 2012-10-18 NOTE — Progress Notes (Signed)
I have read and agree with progress note.  Lataisha Colan M, PT  

## 2012-10-18 NOTE — Progress Notes (Signed)
Physical Therapy Session Note  Patient Details  Name: Aaron [name] MRN: [id number] Date of Birth: 16-Oct-1924  Today's Date: 10/18/2012 Time: 1300-1400 Time Calculation (min): 60 min  Short Term Goals: Week 2:  PT Short Term Goal 1 (Week 2): Patient will perform bed mobility with HOB flat, no bed rails, and supervision to simulate home environment PT Short Term Goal 1 - Progress (Week 2): Met PT Short Term Goal 2 (Week 2): Patient will perform supine > sit transfers with HOB flat, no bed rails, and supervision PT Short Term Goal 2 - Progress (Week 2): Met PT Short Term Goal 3 (Week 2): Patient will perform squat pivot bed <> w/c transfer consistently with min A  PT Short Term Goal 3 - Progress (Week 2): Progressing toward goal PT Short Term Goal 4 (Week 2): Patient will demonstrate improvement in standing tolerance by standing for >5 min without rest break PT Short Term Goal 4 - Progress (Week 2): Met     Therapy Documentation Precautions:  Precautions Precautions: Fall Precaution Comments: Lumbar corset to be worn OOB; LLE BKA, AFIB Required Braces or Orthoses: Spinal Brace Spinal Brace: Lumbar corset Restrictions Weight Bearing Restrictions: No   Pain: Pain Assessment Pain Assessment: Faces Faces Pain Scale: Hurts a little bit Pain Type: Acute pain Pain Location: Back Pain Orientation: Left Pain Descriptors: Discomfort;Aching Pain Onset: Gradual Patients Stated Pain Goal: 2 Pain Intervention(s): RN made aware;Medication (See eMAR)   Other Treatments:  Emphasis of session: transfers - stand pivot with RW and squat pivot; w/c propulsion. Daughter and caregiver with patient on arrival of PT. Daughter reported that patient will be going to live with her, in which he will need to be able to perform transfers with very little assistance secondary to her having back pain. Daughter was educated on  patient's progress and what we will continue to be working toward in  therapy. Patient demonstrated ability to manage all parts of w/c and propelled 150 feet without assist, and requiring min vcs to rest when needed.  Patient performed stand pivot transfer using RW from w/c > mat with min assist for steadying. Patient performed sit > supine with supervision; rolled bilaterally with supervision in order to lumbar corset to be donned. Passive R hamstring stretch was performed x 30 sec x 2 in supine to improve flexibility. Patient performed supine > sit with supervision and stand pivot transfer with RW and min A back to w/c. Patient performed car transfer at higher seat secondary to daughter reporting she will be taking him home in a SUV type vehicle. Patient performed using RW and min to mod A for safety. Patient demonstrated ability to bring BLEs into car with supervision.  Patient performed squat pivot w/c to bed transfer x 6 reps with bed positioned at height of patient's bed at home with min guard to min A. Patient continues to present with difficulty pivoting RLE when performing transfer to the R. Patient required min vcs for technique and safety - i.e. Patient tries to stand up when transferring, instead of maintaining squat position, which places patient at increased risk for falls.  Patient returned to room in w/c with call bell in reach.   See FIM for current functional status  Therapy/Group: Individual Therapy  Noely Kuhnle Hamilton DPT Student  10/18/2012, 2:41 PM

## 2012-10-18 NOTE — Patient Care Conference (Signed)
Inpatient RehabilitationTeam Conference Note Date: 10/17/2012   Time: 11:10 AM    Patient Name: Aaron [name]      Medical Record Number: [id number]  Date of Birth: 31-May-1924 Sex: Male         Room/Bed: 4035/4035-01 Payor Info: Payor: MEDICARE  Plan: MEDICARE PART A AND B  Product Type: *No Product type*     Admitting Diagnosis: LT CVA  Admit Date/Time:  10/03/2012  6:25 PM Admission Comments: No comment available   Primary Diagnosis:  CVA (cerebral infarction) Principal Problem: CVA (cerebral infarction)  Patient Active Problem List   Diagnosis Date Noted  . CVA (cerebral infarction) 10/04/2012  . Compression fracture of spine 10/02/2012  . Acute exacerbation of CHF (congestive heart failure) 10/01/2012  . NSTEMI (non-ST elevated myocardial infarction) 09/30/2012  . Back pain 09/27/2012  . Acute ischemic stroke 09/25/2012  . Hypertension 09/25/2012  . Diabetes mellitus 09/25/2012  . A-fib 09/25/2012  . S/P CABG x 1 09/25/2012  . Aortic valve replaced 09/25/2012    Expected Discharge Date: Expected Discharge Date: 10/24/12  Team Members Present: Physician: Dr. Claudette Laws Social Worker Present: Dossie Der, LCSW Nurse Present: Laural Roes, RN PT Present: Wanda Plump, Varney Biles, PT OT Present: [name], OT SLP Present: Fae Pippin, SLP Other (Discipline and Name): marie Noel-PPS Coordinator     Current Status/Progress Goal Weekly Team Focus  Medical   diabetes uncontrolled, swallowing improved  Maintain CBG in range  Adjust meds   Bowel/Bladder   Continent of bowel and bladder.LBM 10/29/201.  Remain continent of bowel and bladder  Monitor q shift.   Swallow/Nutrition/ Hydration   regular textures and thin liquids mod I   least restrictive p.o. intake   goals met   ADL's   min-mod assist bathing, supervision UB dressing, mod assist LB dressing, min-mod assist stand pivot transfers, mod assist toileting  min assist overall, supervision  grooming and UB dressing   activity tolerance, sit <> stand, standing balance with self-care tasks with decreased UE support   Mobility   Currently supervision for bed mobility, min to mod for squat and stand pivot transfers, min to mod assist for dynamic standing balance. Gait limited secondary to patient not wanting to perform with RW; supervision w/c mobility  supervision for bed mobility and w/c mobility, min A for transfers, mod A for gait  transfers, mobility   Communication   supervision-mod I   goals upgraded   increase self monitoring and advocating   Safety/Cognition/ Behavioral Observations  min assist  min assist   increase use of aids; complete education   Pain      Pain levle less than 3 on scale of       Skin   Redness noted around the scrotum area.  No new skin breakdown or infection.  Monitor for any new skin breakdown and infection.      *See Interdisciplinary Assessment and Plan and progress notes for long and short-term goals  Barriers to Discharge: CVA affected remaining limb    Possible Resolutions to Barriers:  WC level goal    Discharge Planning/Teaching Needs:  Daughter's concern is if pt can injetc himsefl with a insulin pen, deu to caregivers can not give him his meds.  Daughter is here daily and asking many questions      Team Discussion:  Need to see if pt can self inject with insulin pen-caregivers can not. Slow progress. BS better controlled.  Little carryover due to memory issues.  Will have  24 hr care at home  Revisions to Treatment Plan:  None   Continued Need for Acute Rehabilitation Level of Care: The patient requires daily medical management by a physician with specialized training in physical medicine and rehabilitation for the following conditions: Daily direction of a multidisciplinary physical rehabilitation program to ensure safe treatment while eliciting the highest outcome that is of practical value to the patient.: Yes Daily medical  management of patient stability for increased activity during participation in an intensive rehabilitation regime.: Yes Daily analysis of laboratory values and/or radiology reports with any subsequent need for medication adjustment of medical intervention for : Neurological problems;Other  Aaron [name], Aaron [name] 10/18/2012, 9:25 AM

## 2012-10-18 NOTE — Progress Notes (Signed)
Physical Therapy Session Note  Patient Details  Name: Aaron [name] MRN: [id number] Date of Birth: Jan 11, 1924  Today's Date: 10/18/2012 Time: 1106-1200 Time Calculation (min): 54 min  Short Term Goals: Week 2:  PT Short Term Goal 1 (Week 2): Patient will perform bed mobility with HOB flat, no bed rails, and supervision to simulate home environment PT Short Term Goal 1 - Progress (Week 2): Met PT Short Term Goal 2 (Week 2): Patient will perform supine > sit transfers with HOB flat, no bed rails, and supervision PT Short Term Goal 2 - Progress (Week 2): Met PT Short Term Goal 3 (Week 2): Patient will perform squat pivot bed <> w/c transfer consistently with min A  PT Short Term Goal 3 - Progress (Week 2): Progressing toward goal PT Short Term Goal 4 (Week 2): Patient will demonstrate improvement in standing tolerance by standing for >5 min without rest break PT Short Term Goal 4 - Progress (Week 2): Met  Skilled Therapeutic Interventions/Progress Updates:   Pt participated in group LE exercise program consisting of B LE strengthening AROM.  Pt propelled w/c >200 ft with supervision on level surface.  Min assist to transfer secondary to trial splint on RUE and not able to use RUE to transfer during session.  Therapy Documentation Precautions:  Precautions Precautions: Fall Precaution Comments: Lumbar corset to be worn OOB; LLE BKA, AFIB Required Braces or Orthoses: Spinal Brace Spinal Brace: Lumbar corset Restrictions Weight Bearing Restrictions: No :   Pain: Pain Assessment Pain Assessment: Faces Faces Pain Scale: Hurts a little bit Pain Type: Acute pain Pain Location: Back Pain Intervention(s): Repositioned;Distraction Locomotion : Wheelchair Mobility Distance: 200    Therapy/Group: Group Therapy  Newell Coral 10/18/2012, 12:46 PM

## 2012-10-18 NOTE — Progress Notes (Signed)
Physical Therapy Note  Patient Details  Name: Arshan Jabs MRN: [id number] Date of Birth: [birth year] Today's Date: 10/18/2012  Time In:  11:15  Time Out:  11:20.  Splint check for skin issues and tolerance for new splint fabricated for patient this am.  Will check again in one hour.  No current skin issues and patient reports splint is comfortable.   Norton Pastel 10/18/2012, 11:25 AM

## 2012-10-18 NOTE — Progress Notes (Signed)
Patient ID: Aaron [name], male   DOB: 1924/09/01, 78 y.o.   MRN: [id number]   Subjective/Complaints: No pain c/os, participates in PT/OT/SLP Review of Systems  Unable to perform ROS: mental acuity   Objective: Vital Signs: Blood pressure 150/72, pulse 58, temperature 98.3 F (36.8 C), temperature source Oral, resp. rate 18, height 5\' 8"  (1.727 m), weight 66.86 kg (147 lb 6.4 oz), SpO2 96.00%. No results found. Results for orders placed during the hospital encounter of 10/03/12 (from the past 72 hour(s))  GLUCOSE, CAPILLARY     Status: Abnormal   Collection Time   10/15/12 11:34 AM      Component Value Range Comment   Glucose-Capillary 145 (*) 70 - 99 mg/dL    Comment 1 Notify RN     GLUCOSE, CAPILLARY     Status: Abnormal   Collection Time   10/15/12  4:02 PM      Component Value Range Comment   Glucose-Capillary 106 (*) 70 - 99 mg/dL   GLUCOSE, CAPILLARY     Status: Abnormal   Collection Time   10/15/12  8:04 PM      Component Value Range Comment   Glucose-Capillary 131 (*) 70 - 99 mg/dL   GLUCOSE, CAPILLARY     Status: Abnormal   Collection Time   10/16/12 12:05 AM      Component Value Range Comment   Glucose-Capillary 136 (*) 70 - 99 mg/dL    Comment 1 Notify RN     GLUCOSE, CAPILLARY     Status: Abnormal   Collection Time   10/16/12  4:03 AM      Component Value Range Comment   Glucose-Capillary 151 (*) 70 - 99 mg/dL    Comment 1 Notify RN     GLUCOSE, CAPILLARY     Status: Abnormal   Collection Time   10/16/12  7:25 AM      Component Value Range Comment   Glucose-Capillary 128 (*) 70 - 99 mg/dL    Comment 1 Notify RN     GLUCOSE, CAPILLARY     Status: Abnormal   Collection Time   10/16/12 11:35 AM      Component Value Range Comment   Glucose-Capillary 153 (*) 70 - 99 mg/dL    Comment 1 Notify RN     GLUCOSE, CAPILLARY     Status: Normal   Collection Time   10/16/12  4:21 PM      Component Value Range Comment   Glucose-Capillary 95  70 - 99 mg/dL     GLUCOSE, CAPILLARY     Status: Abnormal   Collection Time   10/16/12  8:03 PM      Component Value Range Comment   Glucose-Capillary 178 (*) 70 - 99 mg/dL   GLUCOSE, CAPILLARY     Status: Abnormal   Collection Time   10/17/12  1:55 AM      Component Value Range Comment   Glucose-Capillary 324 (*) 70 - 99 mg/dL   GLUCOSE, CAPILLARY     Status: Abnormal   Collection Time   10/17/12  1:58 AM      Component Value Range Comment   Glucose-Capillary 345 (*) 70 - 99 mg/dL   GLUCOSE, CAPILLARY     Status: Abnormal   Collection Time   10/17/12  4:18 AM      Component Value Range Comment   Glucose-Capillary 270 (*) 70 - 99 mg/dL    Comment 1 Notify RN     [employer]  Status: Abnormal   Collection Time   10/17/12  6:10 AM      Component Value Range Comment   Prothrombin Time 21.8 (*) 11.6 - 15.2 seconds    INR 1.99 (*) 0.00 - 1.49   GLUCOSE, CAPILLARY     Status: Abnormal   Collection Time   10/17/12  9:12 AM      Component Value Range Comment   Glucose-Capillary 342 (*) 70 - 99 mg/dL   GLUCOSE, CAPILLARY     Status: Abnormal   Collection Time   10/17/12 11:55 AM      Component Value Range Comment   Glucose-Capillary 234 (*) 70 - 99 mg/dL    Comment 1 Notify RN     GLUCOSE, CAPILLARY     Status: Abnormal   Collection Time   10/17/12  4:53 PM      Component Value Range Comment   Glucose-Capillary 44 (*) 70 - 99 mg/dL   GLUCOSE, CAPILLARY     Status: Normal   Collection Time   10/17/12  5:25 PM      Component Value Range Comment   Glucose-Capillary 75  70 - 99 mg/dL   GLUCOSE, CAPILLARY     Status: Abnormal   Collection Time   10/17/12  8:12 PM      Component Value Range Comment   Glucose-Capillary 322 (*) 70 - 99 mg/dL   GLUCOSE, CAPILLARY     Status: Abnormal   Collection Time   10/18/12  7:15 AM      Component Value Range Comment   Glucose-Capillary 232 (*) 70 - 99 mg/dL    Comment 1 Notify RN        HEENT: normal Cardio: irregular Resp: CTA B/L GI: BS  positive Extremity:  Cyanosis Skin:   Breakdown R great toe tip ulcer, scab has been removed fresh granulation tissue without active bleeding Neuro: Lethargic, Abnormal Motor, R hand strength improved 2- grip, 3- WE R side 3-/5 in UE and 3/5 in LE and Dysarthric Musc/Skel:  Other L BKA, R hand Dupuytrens contracture 5th digit   Assessment/Plan: 1. Functional deficits secondary to L frontal infarct which require 3+ hours per day of interdisciplinary therapy in a comprehensive inpatient rehab setting. Physiatrist is providing close team supervision and 24 hour management of active medical problems listed below. Physiatrist and rehab team continue to assess barriers to discharge/monitor patient progress toward functional and medical goals. FIM: FIM - Bathing Bathing Steps Patient Completed: Chest;Right Arm;Abdomen;Right upper leg;Left upper leg Bathing: 3: Mod-Patient completes 5-7 57f 10 parts or 50-74%  FIM - Upper Body Dressing/Undressing Upper body dressing/undressing steps patient completed: Thread/unthread right sleeve of pullover shirt/dresss;Thread/unthread left sleeve of pullover shirt/dress;Put head through opening of pull over shirt/dress;Pull shirt over trunk Upper body dressing/undressing: 5: Set-up assist to: Obtain clothing/put away FIM - Lower Body Dressing/Undressing Lower body dressing/undressing steps patient completed: Thread/unthread right underwear leg;Thread/unthread left underwear leg;Thread/unthread right pants leg;Thread/unthread left pants leg;Pull underwear up/down Lower body dressing/undressing: 3: Mod-Patient completed 50-74% of tasks  FIM - Toileting Toileting steps completed by patient: Performs perineal hygiene;Adjust clothing prior to toileting Toileting Assistive Devices: Grab bar or rail for support Toileting: 2: Max-Patient completed 1 of 3 steps  FIM - Diplomatic Services operational officer Devices: Grab bars Toilet Transfers: 3-From toilet/BSC:  Mod A (lift or lower assist);3-To toilet/BSC: Mod A (lift or lower assist)  FIM - Bed/Chair Transfer Bed/Chair Transfer Assistive Devices: HOB elevated;Arm rests;Bed rails Bed/Chair Transfer: 4: Supine > Sit: Min  A (steadying Pt. > 75%/lift 1 leg);4: Sit > Supine: Min A (steadying pt. > 75%/lift 1 leg);3: Bed > Chair or W/C: Mod A (lift or lower assist);3: Chair or W/C > Bed: Mod A (lift or lower assist)  FIM - Locomotion: Wheelchair Distance: 150 Locomotion: Wheelchair: 5: Travels 150 ft or more: maneuvers on rugs and over door sills with supervision, cueing or coaxing FIM - Locomotion: Ambulation Locomotion: Ambulation Assistive Devices: Designer, industrial/product Ambulation/Gait Assistance: 3: Mod assist Locomotion: Ambulation: 0: Activity did not occur  Comprehension Comprehension Mode: Auditory Comprehension: 5-Follows basic conversation/direction: With extra time/assistive device  Expression Expression Mode: Verbal Expression: 5-Expresses basic 90% of the time/requires cueing < 10% of the time.  Social Interaction Social Interaction: 5-Interacts appropriately 90% of the time - Needs monitoring or encouragement for participation or interaction.  Problem Solving Problem Solving Mode: Asleep Problem Solving: 3-Solves basic 50 - 74% of the time/requires cueing 25 - 49% of the time  Memory Memory: 3-Recognizes or recalls 50 - 74% of the time/requires cueing 25 - 49% of the time  Medical Problem List and Plan:  1. Left frontal lobe embolic infarct/recent left below-knee amputation 07/22/2012 , Splinting for L hand OT 2. DVT Prophylaxis/Anticoagulation: Chronic Coumadin therapy. Monitor for any bleeding episodes  3. Pain Management: Tylenol as needed. Monitor with increased mobility  4. Neuropsych: This patient is capable of making decisions on his/her own behalf.Home med was cymbalta will resume  5. Hypertension/atrial fibrillation. Cardiac rate controlled. Continue Norvasc 10 mg daily,  Lopressor 75 mg twice a day and Demadex 10 mg twice a day. Monitor with increased activity  6. Diabetes mellitus.Controlled  Hemoglobin A1c of 8.0.  Lantus insulin 27 units daily. Check blood sugars a.c. and at bedtime see discussion above. Cont current dose.  D/C novalog mealtime coverage 7. Mild compression fractures T7 and T10 as well his chronic compression fractures with cemented vertebral augmentation T11, T12 and L1. No plans for a further vertebral plasty at this time and followup as outpatient unable to do this currently since he would need to be off warfarin -advise the use of a soft lumbar-thoracic corset  -lidoderm patches  8. Left BKA- well shaped. Continue stump shrinker.  -prosthesis at some point after dc once improved from a stroke and back standpoint. 9.  Toe lesion will order neosporin ointment and drsg changes, no further bleeding  LOS (Days) 15 A FACE TO FACE EVALUATION WAS PERFORMED  KIRSTEINS,ANDREW E 10/18/2012, 8:38 AM

## 2012-10-18 NOTE — Progress Notes (Signed)
Speech Language Pathology Daily Session Note & Discharge Summary  Patient Details  Name: Chevy Sweigert MRN: [id number] Date of Birth: 1924/01/05  Today's Date: 10/18/2012 Time: 1420-1450 Time Calculation (min): 30 min  Short Term Goals: Week 2: SLP Short Term Goal 1 (Week 2): Patient will tolerate trials of thin liquids and regular textures without the use of a chin tuck with no overt s/s of aspiration with modified independence. SLP Short Term Goal 2 (Week 2): Patient will utilize speech intelligibility strategies during conversation with supervision level assist visual and verbal cues. SLP Short Term Goal 3 (Week 2): Patient will use external and internal memory aids to facilitate increased recall of new information with mod assist verbal cues.  Skilled Therapeutic Interventions: Treatment addressed speech and cognitive goals. Patient participated in functional conversation with cues to repeat x1; overall intelligibility WFL and patient was overall Mod I for utilization of strategies. Family education was completed with daughter and she reports patient is close to baseline function.  As a result, goals are met and it is recommended that this patient be discharged from SLP services.     FIM:  Comprehension Comprehension Mode: Auditory Comprehension: 5-Follows basic conversation/direction: With extra time/assistive device Expression Expression Mode: Verbal Expression: 5-Expresses basic needs/ideas: With no assist Social Interaction Social Interaction: 6-Interacts appropriately with others with medication or extra time (anti-anxiety, antidepressant). Problem Solving Problem Solving: 3-Solves basic 50 - 74% of the time/requires cueing 25 - 49% of the time Memory Memory: 3-Recognizes or recalls 50 - 74% of the time/requires cueing 25 - 49% of the time FIM - Eating Eating Activity: 5: Set-up assist for open containers;5: Set-up assist for cut food  Pain Pain Assessment Pain Assessment:  No/denies pain  Therapy/Group: Individual Therapy   Speech Language Pathology Discharge Summary  Patient Details  Name: Ramondo Dietze MRN: [id number] Date of Birth: 1924-10-29  Today's Date: 10/18/2012  Patient has met 8 of 8 long term goals.  Patient to discharge at overall Supervision;Min level.  Reasons goals not met: n/a   Clinical Impression/Discharge Summary: Patient met 8 out of 8 long term goals during CIR stay due to functional improvements in swallow function, speech intelligibility and use of external aids for recall of information.  Patient has progressed from Dys.3 textures with a chin tuck with all bites and sips to regular textures and thin liquids without the continued need for use of compensatory strategies.  Overall intelligibility is [facility] with occasional need for cues and then patient is Mod I for utilization of strategies.  Patient will require min assist to utilize external aids to assist with recall.  Family education was completed with daughter and she reports patient is close to baseline function and patient will have the necessary level of assist upon discharge.  As a result, goals are met and it is recommended that this patient be discharged from SLP services and no follow up services are warranted at this time.    Care Partner:  Caregiver Able to Provide Assistance: Yes  Type of Caregiver Assistance: Physical;Cognitive  Recommendation:  24 hour supervision/assistance     Equipment: none   Reasons for discharge: Treatment goals met   Patient/Family Agrees with Progress Made and Goals Achieved: Yes   See FIM for current functional status  Charlane Ferretti., CCC-SLP [phone]  Divya Munshi 10/18/2012, 4:21 PM

## 2012-10-19 ENCOUNTER — Inpatient Hospital Stay (HOSPITAL_COMMUNITY): Payer: Medicare Other | Admitting: Occupational Therapy

## 2012-10-19 ENCOUNTER — Inpatient Hospital Stay (HOSPITAL_COMMUNITY): Payer: Medicare Other | Admitting: Physical Therapy

## 2012-10-19 DIAGNOSIS — I6330 Cerebral infarction due to thrombosis of unspecified cerebral artery: Secondary | ICD-10-CM

## 2012-10-19 DIAGNOSIS — G8110 Spastic hemiplegia affecting unspecified side: Secondary | ICD-10-CM

## 2012-10-19 DIAGNOSIS — I69991 Dysphagia following unspecified cerebrovascular disease: Secondary | ICD-10-CM

## 2012-10-19 LAB — GLUCOSE, CAPILLARY
Glucose-Capillary: 58 mg/dL — ABNORMAL LOW (ref 70–99)
Glucose-Capillary: 91 mg/dL (ref 70–99)
Glucose-Capillary: 122 mg/dL — ABNORMAL HIGH (ref 70–99)
Glucose-Capillary: 134 mg/dL — ABNORMAL HIGH (ref 70–99)
Glucose-Capillary: 200 mg/dL — ABNORMAL HIGH (ref 70–99)
Glucose-Capillary: 251 mg/dL — ABNORMAL HIGH (ref 70–99)
Glucose-Capillary: 92 mg/dL (ref 70–99)
Glucose-Capillary: 139 mg/dL — ABNORMAL HIGH (ref 70–99)

## 2012-10-19 LAB — PROTIME-INR
INR: 2.35 — ABNORMAL HIGH (ref 0.00–1.49)
Prothrombin Time: 24.7 seconds — ABNORMAL HIGH (ref 11.6–15.2)

## 2012-10-19 NOTE — Progress Notes (Signed)
Patient ID: Aaron [name], male   DOB: October 31, 1924, 78 y.o.   MRN: [id number]   Subjective/Complaints: No pain c/os, participates in PT/OT/SLP, low CBG early this am Review of Systems  Unable to perform ROS: mental acuity   Objective: Vital Signs: Blood pressure 145/76, pulse 59, temperature 98 F (36.7 C), temperature source Oral, resp. rate 17, height 5\' 8"  (1.727 m), weight 66.86 kg (147 lb 6.4 oz), SpO2 97.00%. No results found. Results for orders placed during the hospital encounter of 10/03/12 (from the past 72 hour(s))  GLUCOSE, CAPILLARY     Status: Abnormal   Collection Time   10/16/12 11:35 AM      Component Value Range Comment   Glucose-Capillary 153 (*) 70 - 99 mg/dL    Comment 1 Notify RN     GLUCOSE, CAPILLARY     Status: Normal   Collection Time   10/16/12  4:21 PM      Component Value Range Comment   Glucose-Capillary 95  70 - 99 mg/dL   GLUCOSE, CAPILLARY     Status: Abnormal   Collection Time   10/16/12  8:03 PM      Component Value Range Comment   Glucose-Capillary 178 (*) 70 - 99 mg/dL   GLUCOSE, CAPILLARY     Status: Abnormal   Collection Time   10/17/12  1:55 AM      Component Value Range Comment   Glucose-Capillary 324 (*) 70 - 99 mg/dL   GLUCOSE, CAPILLARY     Status: Abnormal   Collection Time   10/17/12  1:58 AM      Component Value Range Comment   Glucose-Capillary 345 (*) 70 - 99 mg/dL   GLUCOSE, CAPILLARY     Status: Abnormal   Collection Time   10/17/12  4:18 AM      Component Value Range Comment   Glucose-Capillary 270 (*) 70 - 99 mg/dL    Comment 1 Notify RN     PROTIME-INR     Status: Abnormal   Collection Time   10/17/12  6:10 AM      Component Value Range Comment   Prothrombin Time 21.8 (*) 11.6 - 15.2 seconds    INR 1.99 (*) 0.00 - 1.49   GLUCOSE, CAPILLARY     Status: Abnormal   Collection Time   10/17/12  9:12 AM      Component Value Range Comment   Glucose-Capillary 342 (*) 70 - 99 mg/dL   GLUCOSE, CAPILLARY     Status:  Abnormal   Collection Time   10/17/12 11:55 AM      Component Value Range Comment   Glucose-Capillary 234 (*) 70 - 99 mg/dL    Comment 1 Notify RN     GLUCOSE, CAPILLARY     Status: Abnormal   Collection Time   10/17/12  4:53 PM      Component Value Range Comment   Glucose-Capillary 44 (*) 70 - 99 mg/dL   GLUCOSE, CAPILLARY     Status: Normal   Collection Time   10/17/12  5:25 PM      Component Value Range Comment   Glucose-Capillary 75  70 - 99 mg/dL   GLUCOSE, CAPILLARY     Status: Abnormal   Collection Time   10/17/12  8:12 PM      Component Value Range Comment   Glucose-Capillary 322 (*) 70 - 99 mg/dL   GLUCOSE, CAPILLARY     Status: Abnormal   Collection Time   10/18/12  7:15 AM      Component Value Range Comment   Glucose-Capillary 232 (*) 70 - 99 mg/dL    Comment 1 Notify RN     GLUCOSE, CAPILLARY     Status: Abnormal   Collection Time   10/18/12 12:12 PM      Component Value Range Comment   Glucose-Capillary 247 (*) 70 - 99 mg/dL    Comment 1 Notify RN     GLUCOSE, CAPILLARY     Status: Normal   Collection Time   10/18/12  5:54 PM      Component Value Range Comment   Glucose-Capillary 84  70 - 99 mg/dL   GLUCOSE, CAPILLARY     Status: Normal   Collection Time   10/18/12  8:11 PM      Component Value Range Comment   Glucose-Capillary 95  70 - 99 mg/dL   GLUCOSE, CAPILLARY     Status: Abnormal   Collection Time   10/19/12  2:11 AM      Component Value Range Comment   Glucose-Capillary 58 (*) 70 - 99 mg/dL   GLUCOSE, CAPILLARY     Status: Abnormal   Collection Time   10/19/12  2:33 AM      Component Value Range Comment   Glucose-Capillary 69 (*) 70 - 99 mg/dL   GLUCOSE, CAPILLARY     Status: Normal   Collection Time   10/19/12  2:53 AM      Component Value Range Comment   Glucose-Capillary 91  70 - 99 mg/dL   GLUCOSE, CAPILLARY     Status: Abnormal   Collection Time   10/19/12  3:54 AM      Component Value Range Comment   Glucose-Capillary 122 (*) 70 -  99 mg/dL    Comment 1 Notify RN     PROTIME-INR     Status: Abnormal   Collection Time   10/19/12  6:13 AM      Component Value Range Comment   Prothrombin Time 24.7 (*) 11.6 - 15.2 seconds    INR 2.35 (*) 0.00 - 1.49      HEENT: normal Cardio: irregular Resp: CTA B/L GI: BS positive Extremity:  Cyanosis Skin:   Breakdown R great toe tip ulcer, scab has been removed fresh granulation tissue without active bleeding Neuro: Lethargic, Abnormal Motor, R hand strength improved 2- grip, 3- WE R side 3-/5 in UE and 3/5 in LE and Dysarthric Musc/Skel:  Other L BKA, R hand Dupuytrens contracture 5th digit   Assessment/Plan: 1. Functional deficits secondary to L frontal infarct which require 3+ hours per day of interdisciplinary therapy in a comprehensive inpatient rehab setting. Physiatrist is providing close team supervision and 24 hour management of active medical problems listed below. Physiatrist and rehab team continue to assess barriers to discharge/monitor patient progress toward functional and medical goals. FIM: FIM - Bathing Bathing Steps Patient Completed: Chest;Right Arm;Abdomen;Right upper leg;Left upper leg;Right lower leg (including foot) Bathing: 3: Mod-Patient completes 5-7 19f 10 parts or 50-74%  FIM - Upper Body Dressing/Undressing Upper body dressing/undressing steps patient completed: Thread/unthread right sleeve of pullover shirt/dresss;Thread/unthread left sleeve of pullover shirt/dress;Put head through opening of pull over shirt/dress;Pull shirt over trunk Upper body dressing/undressing: 5: Supervision: Safety issues/verbal cues FIM - Lower Body Dressing/Undressing Lower body dressing/undressing steps patient completed: Thread/unthread right underwear leg;Thread/unthread left underwear leg;Thread/unthread right pants leg;Thread/unthread left pants leg;Don/Doff right sock;Don/Doff right shoe Lower body dressing/undressing: 3: Mod-Patient completed 50-74% of tasks  FIM  -  Toileting Toileting steps completed by patient: Performs perineal hygiene;Adjust clothing prior to toileting Toileting Assistive Devices: Grab bar or rail for support Toileting: 2: Max-Patient completed 1 of 3 steps  FIM - Diplomatic Services operational officer Devices: Grab bars Toilet Transfers: 3-From toilet/BSC: Mod A (lift or lower assist);3-To toilet/BSC: Mod A (lift or lower assist)  FIM - Bed/Chair Transfer Bed/Chair Transfer Assistive Devices: HOB elevated;Arm rests;Bed rails Bed/Chair Transfer: 5: Supine > Sit: Supervision (verbal cues/safety issues);5: Sit > Supine: Supervision (verbal cues/safety issues);4: Bed > Chair or W/C: Min A (steadying Pt. > 75%);4: Chair or W/C > Bed: Min A (steadying Pt. > 75%)  FIM - Locomotion: Wheelchair Distance: 200 Locomotion: Wheelchair: 6: Travels 150 ft or more, turns around, maneuvers to table, bed or toilet, negotiates 3% grade: maneuvers on rugs and over door sills independently FIM - Locomotion: Ambulation Locomotion: Ambulation Assistive Devices: Designer, industrial/product Ambulation/Gait Assistance: 3: Mod assist Locomotion: Ambulation: 0: Activity did not occur  Comprehension Comprehension Mode: Auditory Comprehension: 5-Follows basic conversation/direction: With extra time/assistive device  Expression Expression Mode: Verbal Expression: 5-Expresses basic needs/ideas: With no assist  Social Interaction Social Interaction: 6-Interacts appropriately with others with medication or extra time (anti-anxiety, antidepressant).  Problem Solving Problem Solving Mode: Asleep Problem Solving: 3-Solves basic 50 - 74% of the time/requires cueing 25 - 49% of the time  Memory Memory: 3-Recognizes or recalls 50 - 74% of the time/requires cueing 25 - 49% of the time  Medical Problem List and Plan:  1. Left frontal lobe embolic infarct/recent left below-knee amputation 07/22/2012 , Splinting for L hand OT 2. DVT Prophylaxis/Anticoagulation:  Chronic Coumadin therapy. Monitor for any bleeding episodes  3. Pain Management: Tylenol as needed. Monitor with increased mobility  4. Neuropsych: This patient is capable of making decisions on his/her own behalf.Home med was cymbalta will resume  5. Hypertension/atrial fibrillation. Cardiac rate controlled. Continue Norvasc 10 mg daily, Lopressor 75 mg twice a day and Demadex 10 mg twice a day. Monitor with increased activity  6. Diabetes mellitus.Controlled  Hemoglobin A1c of 8.0.  Lantus insulin 27 units daily. Check blood sugars a.c. and at bedtime see discussion above. Cont current dose.  D/C novalog bedtime and mealtime coverage 7. Mild compression fractures T7 and T10 as well his chronic compression fractures with cemented vertebral augmentation T11, T12 and L1. No plans for a further vertebral plasty at this time and followup as outpatient unable to do this currently since he would need to be off warfarin -advise the use of a soft lumbar-thoracic corset  -lidoderm patches  8. Left BKA- well shaped. Continue stump shrinker.  -prosthesis at some point after dc once improved from a stroke and back standpoint. 9.  Toe lesion will order neosporin ointment and drsg changes, no further bleeding  LOS (Days) 16 A FACE TO FACE EVALUATION WAS PERFORMED  Danesha Kirchoff E 10/19/2012, 8:13 AM

## 2012-10-19 NOTE — Progress Notes (Signed)
I have read and agree with the following treatment session.  Zamiyah Resendes Hall, PT, DPT 

## 2012-10-19 NOTE — Progress Notes (Signed)
Hypoglycemic Event  CBG: 58  Treatment: 1/2 cup Coke  Symptoms: None  Follow-up CBG: Time:0233 CBG Result: 69  Possible Reasons for Event: Unknown; pt ate HS snack  Comments/MD notified: Taking PO's without difficulty    Aaron [name]  Remember to initiate Hypoglycemia Order Set & complete

## 2012-10-19 NOTE — Progress Notes (Signed)
Hypoglycemic Event  CBG: 69  Treatment:  1/2 cup Coke  Symptoms: None  Follow-up CBG: Time: 0253  CBG Result: 91   Possible Reasons for Event:  Unknown  Comments/MD notified: Taking PO's without difficulty    Aaron [name]  Remember to initiate Hypoglycemia Order Set & complete

## 2012-10-19 NOTE — Progress Notes (Signed)
Social Work Patient ID: Aaron [name], male   DOB: 08-Dec-1924, 78 y.o.   MRN: [id number] Met with pt and daughter to inform team conference progression toward goals and discharge-Wednesday.  She will bring his insulin pen to begin work Education on Monday.  Discussed caregivers concern of needing a hospital bed.  Both want to try his own bed first then get a hospital bed if needed. Daughter expressed how hard it is to watch your father decline, main issue is to make sure he is comfortable and his wishes are provided for.  Quality of life  Not quantity.  Gave her information regarding caregivers suppprt group.  Continue to work toward discharge for Wed.  Pt reports: "I will be glad to go home."

## 2012-10-19 NOTE — Progress Notes (Signed)
ANTICOAGULATION CONSULT NOTE - Follow Up Consult  Pharmacy Consult for coumadin  Indication: atrial fibrillation, secondary stroke prophylaxis  Patient Measurements: Height: 5\' 8"  (172.7 cm) Weight: 147 lb 6.4 oz (66.86 kg) IBW/kg (Calculated) : 68.4   Vital Signs: Temp: 98 F (36.7 C) (11/01 0551) Temp src: Oral (11/01 0551) BP: 145/76 mmHg (11/01 0551) Pulse Rate: 59  (11/01 0551)  Labs:  Basename 10/19/12 5366 10/17/12 0610  HGB -- --  HCT -- --  PLT -- --  APTT -- --  LABPROT 24.7* 21.8*  INR 2.35* 1.99*  HEPARINUNFRC -- --  CREATININE -- --  CKTOTAL -- --  CKMB -- --  TROPONINI -- --   Estimated Creatinine Clearance: 42.2 ml/min (by C-G formula based on Cr of 1.19).  Assessment: 78 y/o male patient with history of afib and stroke on coumadin PTA (2.5mg /day).  Patient was on Pradaxa but family prefers coumadin. INR today remains therapeutic.  Goal of Therapy:  INR 2-3 Monitor platelets by anticoagulation protocol: Yes  Plan:  Continue warfarin 2.5mg  daily as at home.  Check INR 11/4 (M-W-F).  Estella Husk, Pharm.D., BCPS Clinical Pharmacist  Phone [phone] Pager [phone] 10/19/2012, 2:23 PM

## 2012-10-19 NOTE — Progress Notes (Signed)
Occupational Therapy Weekly Progress Note and Treatment Session Note  Patient Details  Name: Aaron [name] MRN: [id number] Date of Birth: 07/22/1924  Today's Date: 10/19/2012 Time: 0915-1000 and 7829-5621 Time Calculation (min): 45 min and 30 min  Patient has met 3 of 4 short term goals.  Pt is showing progress towards self-care goals, however he requires mod-max encouragement to attempt a task prior to asking for assistance or staff to do task for him.  Pt fluctuates between min-mod assist with transfers and min-mod assist with LB bathing and dressing. Will continue to benefit from additional practice/repetition to increase safety with self-care tasks.  Pt with decreased carryover secondary to memory deficits.  Patient continues to demonstrate the following deficits: hemiplegic Rt hand, decreased strength, decreased activity tolerance, decreased postural control, impaired balance and coordination and therefore will continue to benefit from skilled OT intervention to enhance overall performance with BADL and Reduce care partner burden.  Patient progressing toward long term goals..  Continue plan of care.  OT Short Term Goals Week 2:  OT Short Term Goal 1 (Week 2): Pt will complete LB bathing with min assist at sit to stand level  OT Short Term Goal 1 - Progress (Week 2): Met OT Short Term Goal 2 (Week 2): Pt will complete toileting (clothing management and hygiene) with mod assist  OT Short Term Goal 2 - Progress (Week 2): Met OT Short Term Goal 3 (Week 2): Pt will complete toilet transfer with min assist OT Short Term Goal 3 - Progress (Week 2): Progressing toward goal OT Short Term Goal 4 (Week 2): Pt will integrate RUE into self-care tasks at gross assist with min cues OT Short Term Goal 4 - Progress (Week 2): Met Week 3:  OT Short Term Goal 1 (Week 3): STG = LTGs due to remaining LOS  Skilled Therapeutic Interventions/Progress Updates:    1) Pt seen for ADL retraining with focus on sit  <> stand, dynamic standing balance, and use of RUE for stability while using LUE to don/doff pants.  Pt with increased participation this session and willingness to attempt LB bathing and dressing in standing.  Min assist sit to stand, requiring min-mod assist to maintain upright standing balance while using LUE to pull down pajamas, wash, and pull up pants.  Pt demonstrated ability to use stool to assist with donning Rt sock and shoe.  Hand over hand assist with Rt hand to wash LUE, with mod encouragement.  2) 1:1 OT with focus on functional transfers with stand pivot to/from toilet with use of grab bar with min assist.  Pt requires occasional physical assist with pivoting Rt foot, however pt demonstrating increased safety and independence with pivoting foot.  Engaged in block pushups 2 reps of 10 and ball toss with focus on incorporating spontaneous use of RUE in tasks.  Issued pt shoe button and explained, with return demonstration, how to use it to assist with fastening shoes.  Posted instructions in room for family and nsg to encourage him to use shoe button to be more independent with donning and fastening shoe.  Therapy Documentation Precautions:  Precautions Precautions: Fall Precaution Comments: Lumbar corset to be worn OOB; LLE BKA, AFIB  Required Braces or Orthoses: Spinal Brace Spinal Brace: Lumbar corset Restrictions Weight Bearing Restrictions: No Pain: Pain Assessment Pain Assessment: No/denies pain ADL: ADL Eating: Set up Where Assessed-Eating: Edge of bed Grooming: Supervision/safety Where Assessed-Grooming: Sitting at sink Upper Body Bathing: Minimal assistance Where Assessed-Upper Body Bathing: Sitting at sink  Lower Body Bathing: Minimal assistance Where Assessed-Lower Body Bathing: Sitting at sink;Standing at sink Upper Body Dressing: Supervision/safety;Setup Where Assessed-Upper Body Dressing: Sitting at sink Lower Body Dressing: Minimal assistance Where  Assessed-Lower Body Dressing: Sitting at sink;Standing at sink Toileting: Moderate assistance Where Assessed-Toileting: Teacher, adult education: Moderate assistance Toilet Transfer Method: Stand pivot Toilet Transfer Equipment: Grab bars ADL Comments: Pt requires max encouragement to attempt pulling up/down pants in standing with toileting and LB dressing (He will let people help him even with tasks he can do).  See FIM for current functional status  Therapy/Group: Individual Therapy  [name] 10/19/2012, 9:59 AM

## 2012-10-19 NOTE — Progress Notes (Signed)
Physical Therapy Session Note  Patient Details  Name: Aaron [name] MRN: [id number] Date of Birth: 08-15-1924  Today's Date: 10/19/2012 Time: 1000-1100; 1300-1400 Time Calculation (min): 60 min; 60 min  Short Term Goals: Week 2:  PT Short Term Goal 1 (Week 2): Patient will perform bed mobility with HOB flat, no bed rails, and supervision to simulate home environment PT Short Term Goal 1 - Progress (Week 2): Met PT Short Term Goal 2 (Week 2): Patient will perform supine > sit transfers with HOB flat, no bed rails, and supervision PT Short Term Goal 2 - Progress (Week 2): Met PT Short Term Goal 3 (Week 2): Patient will perform squat pivot bed <> w/c transfer consistently with min A  PT Short Term Goal 3 - Progress (Week 2): Progressing toward goal PT Short Term Goal 4 (Week 2): Patient will demonstrate improvement in standing tolerance by standing for >5 min without rest break PT Short Term Goal 4 - Progress (Week 2): Met  Skilled Therapeutic Interventions/Progress Updates:      Therapy Documentation Precautions:  Precautions Precautions: Fall;Back Precaution Comments: Lumbar corset to be worn OOB; LLE BKA, AFIB  Required Braces or Orthoses: Spinal Brace Spinal Brace: Lumbar corset Restrictions Weight Bearing Restrictions: No General:   Vital Signs:   Pain: Pain Assessment Pain Assessment: No/denies pain Mobility:   Locomotion : Ambulation Ambulation/Gait Assistance: 3: Mod assist  Patient performed 1 step x 2 reps forward using RW and mod A for safety and RUE placement to get in position to perform LLE hip extension.   Other Treatments:  Patient sitting in w/c upon PT arrival. Patient propelled w/c 180 feet, independently, requiring one rest break secondary to decreased activity tolerance. Patient performed w/c <> mat transfer x 4 with mod assist when going to the R; min A going to the L. Patient adamant to do it "his way," which continues to be unsafe. Patient required min  to mod vcs for safety. Patient performed the following exercises bilaterally in supine x 10 reps x 2 sets:  Glut squeezes, SLR, hip abduction/adduction; Performed only on R: heel slides with resisted hip/knee ext; hip abd/ER with blue theraband; AP with resisted PF with blue theraband; passive hamstring stretch x 30 sec x 2 sets. Patient performed hip extension on LLE in standing with RW x 10 reps x 2 sets with min A for maintaining balance, safety. Patient performed squat pivot transfer from mat > w/c with min A, requiring vcs for sequencing/technique. Patient propelled 180 feet back to room, independently. Patient performed squat pivot transfer from w/c > bed to the L. Patient left lying in bed, bed alarm on, and call bell in reach.  PM Session: Patient sitting at EOB upon PT arrival, daughter at bedside. Donned lumbar corset prior to performing squat pivot transfer from bed > w/c. Patient taken outside and car transfer completed with daughter. Patient performed stand pivot w/c > car transfer using RW with min to mod A for steadying, AD management, and safety. Patient performed squat pivot from car > w/c with min A for safety. Patient performed getting in and out of car using squat pivot transfer one more time, again requiring min A. Patient is more comfortable performing squat pivot secondary to not feeling comfortable using RW. Daughter would like to practice car transfer once more prior to patient's d/c.  Patient returned to PT gym and performed standing balance at RW while tapping ball with LUE with emphasis on standing tolerance and dynamic standing balance.  Patient able to stand for ~3 min prior to needing rest break, requiring min A to maintain balance. Intermittent vcs needed to correct LOB. Task repeated using RUE - patient able to stand for ~1 1/2 min prior to needed seated rest break, requiring min to mod A to maintain balance.  Patient performed w/c <> mat squat pivot transfer x 2 reps with  mat positioned at height of patient's bed at home, requiring min guard to min A for safety. Patient continues to demonstrate trouble pivoting on RLE when transfer to the R. Will continue to practice prior to patient leaving. Patient propelled self back toward room in w/c ~150 feet independently. Patient left sitting in w/c at end of session with call bell in reach.     See FIM for current functional status  Therapy/Group: Individual Therapy  Secilia Apps Hamilton DPT Student  10/19/2012, 12:12 PM

## 2012-10-20 ENCOUNTER — Inpatient Hospital Stay (HOSPITAL_COMMUNITY): Payer: Medicare Other | Admitting: Physical Therapy

## 2012-10-20 DIAGNOSIS — I69991 Dysphagia following unspecified cerebrovascular disease: Secondary | ICD-10-CM

## 2012-10-20 DIAGNOSIS — G8110 Spastic hemiplegia affecting unspecified side: Secondary | ICD-10-CM

## 2012-10-20 DIAGNOSIS — I6330 Cerebral infarction due to thrombosis of unspecified cerebral artery: Secondary | ICD-10-CM

## 2012-10-20 LAB — GLUCOSE, CAPILLARY: Glucose-Capillary: 165 mg/dL — ABNORMAL HIGH (ref 70–99)

## 2012-10-20 MED ORDER — INSULIN GLARGINE 100 UNIT/ML ~~LOC~~ SOLN
30.0000 [IU] | Freq: Every day | SUBCUTANEOUS | Status: DC
  Administered 2012-10-20 – 2012-10-24 (×5): 30 [IU] via SUBCUTANEOUS

## 2012-10-20 NOTE — Progress Notes (Signed)
Patient ID: Aaron [name], male   DOB: 07-05-1924, 78 y.o.   MRN: [id number]   Subjective/Complaints: Review of Systems   Feeling well. Making progress with therapies. Denies pain  A 12 point review of systems has been performed and if not noted above is otherwise negative.   Objective: Vital Signs: Blood pressure 149/68, pulse 74, temperature 98.2 F (36.8 C), temperature source Oral, resp. rate 17, height 5\' 8"  (1.727 m), weight 66.86 kg (147 lb 6.4 oz), SpO2 98.00%. No results found. Results for orders placed during the hospital encounter of 10/03/12 (from the past 72 hour(s))  GLUCOSE, CAPILLARY     Status: Abnormal   Collection Time   10/17/12  9:12 AM      Component Value Range Comment   Glucose-Capillary 342 (*) 70 - 99 mg/dL   GLUCOSE, CAPILLARY     Status: Abnormal   Collection Time   10/17/12 11:55 AM      Component Value Range Comment   Glucose-Capillary 234 (*) 70 - 99 mg/dL    Comment 1 Notify RN     GLUCOSE, CAPILLARY     Status: Abnormal   Collection Time   10/17/12  4:53 PM      Component Value Range Comment   Glucose-Capillary 44 (*) 70 - 99 mg/dL   GLUCOSE, CAPILLARY     Status: Normal   Collection Time   10/17/12  5:25 PM      Component Value Range Comment   Glucose-Capillary 75  70 - 99 mg/dL   GLUCOSE, CAPILLARY     Status: Abnormal   Collection Time   10/17/12  8:12 PM      Component Value Range Comment   Glucose-Capillary 322 (*) 70 - 99 mg/dL   GLUCOSE, CAPILLARY     Status: Abnormal   Collection Time   10/18/12  7:15 AM      Component Value Range Comment   Glucose-Capillary 232 (*) 70 - 99 mg/dL    Comment 1 Notify RN     GLUCOSE, CAPILLARY     Status: Abnormal   Collection Time   10/18/12 12:12 PM      Component Value Range Comment   Glucose-Capillary 247 (*) 70 - 99 mg/dL    Comment 1 Notify RN     GLUCOSE, CAPILLARY     Status: Normal   Collection Time   10/18/12  5:54 PM      Component Value Range Comment   Glucose-Capillary 84  70 -  99 mg/dL   GLUCOSE, CAPILLARY     Status: Normal   Collection Time   10/18/12  8:11 PM      Component Value Range Comment   Glucose-Capillary 95  70 - 99 mg/dL   GLUCOSE, CAPILLARY     Status: Abnormal   Collection Time   10/19/12  2:11 AM      Component Value Range Comment   Glucose-Capillary 58 (*) 70 - 99 mg/dL   GLUCOSE, CAPILLARY     Status: Abnormal   Collection Time   10/19/12  2:33 AM      Component Value Range Comment   Glucose-Capillary 69 (*) 70 - 99 mg/dL   GLUCOSE, CAPILLARY     Status: Normal   Collection Time   10/19/12  2:53 AM      Component Value Range Comment   Glucose-Capillary 91  70 - 99 mg/dL   GLUCOSE, CAPILLARY     Status: Abnormal   Collection Time   10/19/12  3:54 AM      Component Value Range Comment   Glucose-Capillary 122 (*) 70 - 99 mg/dL    Comment 1 Notify RN     PROTIME-INR     Status: Abnormal   Collection Time   10/19/12  6:13 AM      Component Value Range Comment   Prothrombin Time 24.7 (*) 11.6 - 15.2 seconds    INR 2.35 (*) 0.00 - 1.49   GLUCOSE, CAPILLARY     Status: Abnormal   Collection Time   10/19/12  8:09 AM      Component Value Range Comment   Glucose-Capillary 134 (*) 70 - 99 mg/dL   GLUCOSE, CAPILLARY     Status: Abnormal   Collection Time   10/19/12 11:57 AM      Component Value Range Comment   Glucose-Capillary 200 (*) 70 - 99 mg/dL   GLUCOSE, CAPILLARY     Status: Abnormal   Collection Time   10/19/12 12:22 PM      Component Value Range Comment   Glucose-Capillary 251 (*) 70 - 99 mg/dL   GLUCOSE, CAPILLARY     Status: Normal   Collection Time   10/19/12  4:03 PM      Component Value Range Comment   Glucose-Capillary 92  70 - 99 mg/dL   GLUCOSE, CAPILLARY     Status: Abnormal   Collection Time   10/19/12  8:53 PM      Component Value Range Comment   Glucose-Capillary 139 (*) 70 - 99 mg/dL   GLUCOSE, CAPILLARY     Status: Abnormal   Collection Time   10/20/12 12:08 AM      Component Value Range Comment    Glucose-Capillary 255 (*) 70 - 99 mg/dL    Comment 1 Notify RN     GLUCOSE, CAPILLARY     Status: Abnormal   Collection Time   10/20/12  3:57 AM      Component Value Range Comment   Glucose-Capillary 256 (*) 70 - 99 mg/dL    Comment 1 Notify RN        HEENT: normal Cardio: irregular Resp: CTA B/L GI: BS positive Extremity:  Cyanosis Skin:   Breakdown R great toe tip ulcer, scab has been removed fresh granulation tissue without active bleeding Neuro: Lethargic, Abnormal Motor, R hand strength improved 2- grip, 3- WE R side 3-/5 in UE and 3/5 in LE and Dysarthric, aphasic Musc/Skel:  Other L BKA, R hand Dupuytrens contracture 5th digit   Assessment/Plan: 1. Functional deficits secondary to L frontal infarct which require 3+ hours per day of interdisciplinary therapy in a comprehensive inpatient rehab setting. Physiatrist is providing close team supervision and 24 hour management of active medical problems listed below. Physiatrist and rehab team continue to assess barriers to discharge/monitor patient progress toward functional and medical goals. FIM: FIM - Bathing Bathing Steps Patient Completed: Chest;Right Arm;Abdomen;Front perineal area;Buttocks;Right upper leg;Left upper leg;Right lower leg (including foot) Bathing: 4: Min-Patient completes 8-9 22f 10 parts or 75+ percent  FIM - Upper Body Dressing/Undressing Upper body dressing/undressing steps patient completed: Thread/unthread right sleeve of pullover shirt/dresss;Put head through opening of pull over shirt/dress;Pull shirt over trunk;Thread/unthread left sleeve of pullover shirt/dress Upper body dressing/undressing: 5: Set-up assist to: Obtain clothing/put away FIM - Lower Body Dressing/Undressing Lower body dressing/undressing steps patient completed: Thread/unthread right underwear leg;Thread/unthread left underwear leg;Pull underwear up/down;Thread/unthread right pants leg;Thread/unthread left pants leg;Pull pants  up/down;Don/Doff right sock;Don/Doff right shoe Lower body dressing/undressing: 4:  Min-Patient completed 75 plus % of tasks  FIM - Toileting Toileting steps completed by patient: Performs perineal hygiene;Adjust clothing prior to toileting Toileting Assistive Devices: Grab bar or rail for support Toileting: 2: Max-Patient completed 1 of 3 steps  FIM - Diplomatic Services operational officer Devices: Grab bars Toilet Transfers: 4-To toilet/BSC: Min A (steadying Pt. > 75%);4-From toilet/BSC: Min A (steadying Pt. > 75%)  FIM - Bed/Chair Transfer Bed/Chair Transfer Assistive Devices: Bed rails;Arm rests;HOB elevated Bed/Chair Transfer: 4: Supine > Sit: Min A (steadying Pt. > 75%/lift 1 leg);4: Sit > Supine: Min A (steadying pt. > 75%/lift 1 leg);4: Bed > Chair or W/C: Min A (steadying Pt. > 75%);4: Chair or W/C > Bed: Min A (steadying Pt. > 75%)  FIM - Locomotion: Wheelchair Distance: 200 Locomotion: Wheelchair: 6: Travels 150 ft or more, turns around, maneuvers to table, bed or toilet, negotiates 3% grade: maneuvers on rugs and over door sills independently FIM - Locomotion: Ambulation Locomotion: Ambulation Assistive Devices: Designer, industrial/product Ambulation/Gait Assistance: 3: Mod assist Locomotion: Ambulation: 1: Travels less than 50 ft with moderate assistance (Pt: 50 - 74%)  Comprehension Comprehension Mode: Auditory Comprehension: 5-Follows basic conversation/direction: With extra time/assistive device  Expression Expression Mode: Verbal Expression: 5-Expresses basic needs/ideas: With no assist  Social Interaction Social Interaction: 6-Interacts appropriately with others with medication or extra time (anti-anxiety, antidepressant).  Problem Solving Problem Solving Mode: Asleep Problem Solving: 3-Solves basic 50 - 74% of the time/requires cueing 25 - 49% of the time  Memory Memory: 3-Recognizes or recalls 50 - 74% of the time/requires cueing 25 - 49% of the time  Medical  Problem List and Plan:  1. Left frontal lobe embolic infarct/recent left below-knee amputation 07/22/2012 , Splinting for L hand OT 2. DVT Prophylaxis/Anticoagulation: Chronic Coumadin therapy. Monitor for any bleeding episodes  3. Pain Management: Tylenol as needed. Monitor with increased mobility  4. Neuropsych: This patient is capable of making decisions on his/her own behalf.Home med was cymbalta will resume  5. Hypertension/atrial fibrillation. Cardiac rate controlled. Continue Norvasc 10 mg daily, Lopressor 75 mg twice a day and Demadex 10 mg twice a day. Monitor with increased activity  6. Diabetes mellitus.Controlled  Hemoglobin A1c of 8.0.  Lantus insulin 27 units daily--increase to 30 qday. Check blood sugars a.c. and at bedtime see discussion above. Cont current dose.  D/C novalog bedtime and mealtime coverage 7. Mild compression fractures T7 and T10 as well his chronic compression fractures with cemented vertebral augmentation T11, T12 and L1. No plans for a further vertebral plasty at this time and followup as outpatient unable to do this currently since he would need to be off warfarin -advise the use of a soft lumbar-thoracic corset  -lidoderm patches  8. Left BKA- well shaped. Continue stump shrinker.  -prosthesis at some point after dc once improved from a stroke and back standpoint. 9.  Toe lesion will order neosporin ointment and drsg changes, no further bleeding  LOS (Days) 17 A FACE TO FACE EVALUATION WAS PERFORMED  Safiya Girdler T 10/20/2012, 6:40 AM

## 2012-10-20 NOTE — Progress Notes (Signed)
Physical Therapy Session Note  Patient Details  Name: Aaron [name] MRN: [id number] Date of Birth: 29-Aug-1924  Today's Date: 10/20/2012 Time: 1330-1430 Time Calculation: 60'  Short Term Goals: Week 1:  PT Short Term Goal 1 (Week 1): Patient will perform bed mobility without use of rails and HOB flat with supervision. PT Short Term Goal 1 - Progress (Week 1): Met PT Short Term Goal 2 (Week 1): Patient will perform supine > sit transfer with supervision. PT Short Term Goal 2 - Progress (Week 1): Met PT Short Term Goal 3 (Week 1): Patient will perform sit to stand transfer with UE support and max assist PT Short Term Goal 3 - Progress (Week 1): Met PT Short Term Goal 4 (Week 1): Patient will propel w/c 50 feet with RLE and LUE with supervision PT Short Term Goal 4 - Progress (Week 1): Met  Therapy Documentation Precautions:  Precautions: Fall;Back Precaution Comments: Lumbar corset to be worn OOB; LLE BKA, AFIB  Required Braces or Orthoses: Spinal Brace Spinal Brace: Lumbar corset Restrictions Weight Bearing Restrictions: No Pain: Denies pain currently  OEG/LEG Exercise Group: Therapeutic Exercises B LE's in sitting including 3# ankle weight for Right LE and B manually resisted exercises   Therapy/Group: Group Therapy  Giomar Gusler J 10/20/2012, 5:50 PM

## 2012-10-21 ENCOUNTER — Inpatient Hospital Stay (HOSPITAL_COMMUNITY): Payer: Medicare Other | Admitting: Occupational Therapy

## 2012-10-21 LAB — GLUCOSE, CAPILLARY
Glucose-Capillary: 182 mg/dL — ABNORMAL HIGH (ref 70–99)
Glucose-Capillary: 166 mg/dL — ABNORMAL HIGH (ref 70–99)
Glucose-Capillary: 222 mg/dL — ABNORMAL HIGH (ref 70–99)

## 2012-10-21 NOTE — Progress Notes (Signed)
Occupational Therapy Session Note  Patient Details  Name: Aaron [name] MRN: [id number] Date of Birth: 08-30-1924  Today's Date: 10/21/2012 Time: 7829-5621 Time Calculation (min): 53 min  Skilled Therapeutic Interventions/Progress Updates: ADL in w/c at sink with focus on encouraging patient to participate in certain tasks such as peribathing and pulling up of pants and attempted to complete Hand over hand assist in Right hand to help wash left arm.  Patient adamantly refused to use his right hand and stated with a loud voice x2, "I am not going to do anything with this hand.  I had a stroke in it!"  Patient was able to hold balance to stand at sink for clinician to wash periearea and apply antifungal powder.    Therapy Documentation Precautions:  Precautions Precautions: Fall;Back Precaution Comments: Lumbar corset to be worn OOB; LLE BKA, AFIB  Required Braces or Orthoses: Spinal Brace Spinal Brace: Lumbar corset Restrictions Weight Bearing Restrictions: No  Pain: denied   See FIM for current functional status  Therapy/Group: Individual Therapy  [name] [facility] 10/21/2012, 4:11 PM

## 2012-10-21 NOTE — Significant Event (Signed)
Hypoglycemic Event  CBG: 60  Treatment: 15 GM carbohydrate snack  Symptoms: Sweaty  Follow-up CBG: [id number] CBG Result:[id number]  Possible Reasons for Event: Unknown  Comments/MD notified:Dr. Riley Kill, monitor    Carlean Purl  Remember to initiate Hypoglycemia Order Set & complete

## 2012-10-21 NOTE — Progress Notes (Signed)
Patient ID: Aaron [name], male   DOB: 03-23-1924, 78 y.o.   MRN: [id number]   Subjective/Complaints: Review of Systems   Feeling well. Making progress with therapies. No new problems yesterday or today thus far.  A 12 point review of systems has been performed and if not noted above is otherwise negative.   Objective: Vital Signs: Blood pressure 165/76, pulse 75, temperature 98.1 F (36.7 C), temperature source Oral, resp. rate 18, height 5\' 8"  (1.727 m), weight 70.3 kg (154 lb 15.7 oz), SpO2 99.00%. No results found. Results for orders placed during the hospital encounter of 10/03/12 (from the past 72 hour(s))  GLUCOSE, CAPILLARY     Status: Abnormal   Collection Time   10/18/12  7:15 AM      Component Value Range Comment   Glucose-Capillary 232 (*) 70 - 99 mg/dL    Comment 1 Notify RN     GLUCOSE, CAPILLARY     Status: Abnormal   Collection Time   10/18/12 12:12 PM      Component Value Range Comment   Glucose-Capillary 247 (*) 70 - 99 mg/dL    Comment 1 Notify RN     GLUCOSE, CAPILLARY     Status: Normal   Collection Time   10/18/12  5:54 PM      Component Value Range Comment   Glucose-Capillary 84  70 - 99 mg/dL   GLUCOSE, CAPILLARY     Status: Normal   Collection Time   10/18/12  8:11 PM      Component Value Range Comment   Glucose-Capillary 95  70 - 99 mg/dL   GLUCOSE, CAPILLARY     Status: Abnormal   Collection Time   10/19/12  2:11 AM      Component Value Range Comment   Glucose-Capillary 58 (*) 70 - 99 mg/dL   GLUCOSE, CAPILLARY     Status: Abnormal   Collection Time   10/19/12  2:33 AM      Component Value Range Comment   Glucose-Capillary 69 (*) 70 - 99 mg/dL   GLUCOSE, CAPILLARY     Status: Normal   Collection Time   10/19/12  2:53 AM      Component Value Range Comment   Glucose-Capillary 91  70 - 99 mg/dL   GLUCOSE, CAPILLARY     Status: Abnormal   Collection Time   10/19/12  3:54 AM      Component Value Range Comment   Glucose-Capillary 122 (*) 70 - 99  mg/dL    Comment 1 Notify RN     PROTIME-INR     Status: Abnormal   Collection Time   10/19/12  6:13 AM      Component Value Range Comment   Prothrombin Time 24.7 (*) 11.6 - 15.2 seconds    INR 2.35 (*) 0.00 - 1.49   GLUCOSE, CAPILLARY     Status: Abnormal   Collection Time   10/19/12  8:09 AM      Component Value Range Comment   Glucose-Capillary 134 (*) 70 - 99 mg/dL   GLUCOSE, CAPILLARY     Status: Abnormal   Collection Time   10/19/12 11:57 AM      Component Value Range Comment   Glucose-Capillary 200 (*) 70 - 99 mg/dL   GLUCOSE, CAPILLARY     Status: Abnormal   Collection Time   10/19/12 12:22 PM      Component Value Range Comment   Glucose-Capillary 251 (*) 70 - 99 mg/dL   GLUCOSE,  CAPILLARY     Status: Normal   Collection Time   10/19/12  4:03 PM      Component Value Range Comment   Glucose-Capillary 92  70 - 99 mg/dL   GLUCOSE, CAPILLARY     Status: Abnormal   Collection Time   10/19/12  8:53 PM      Component Value Range Comment   Glucose-Capillary 139 (*) 70 - 99 mg/dL   GLUCOSE, CAPILLARY     Status: Abnormal   Collection Time   10/20/12 12:08 AM      Component Value Range Comment   Glucose-Capillary 255 (*) 70 - 99 mg/dL    Comment 1 Notify RN     GLUCOSE, CAPILLARY     Status: Abnormal   Collection Time   10/20/12  3:57 AM      Component Value Range Comment   Glucose-Capillary 256 (*) 70 - 99 mg/dL    Comment 1 Notify RN     GLUCOSE, CAPILLARY     Status: Abnormal   Collection Time   10/20/12  7:49 AM      Component Value Range Comment   Glucose-Capillary 315 (*) 70 - 99 mg/dL   GLUCOSE, CAPILLARY     Status: Abnormal   Collection Time   10/20/12 11:25 AM      Component Value Range Comment   Glucose-Capillary 191 (*) 70 - 99 mg/dL   GLUCOSE, CAPILLARY     Status: Abnormal   Collection Time   10/20/12  4:54 PM      Component Value Range Comment   Glucose-Capillary 194 (*) 70 - 99 mg/dL   GLUCOSE, CAPILLARY     Status: Abnormal   Collection Time   10/20/12   8:54 PM      Component Value Range Comment   Glucose-Capillary 156 (*) 70 - 99 mg/dL    Comment 1 Notify RN     GLUCOSE, CAPILLARY     Status: Abnormal   Collection Time   10/20/12 11:52 PM      Component Value Range Comment   Glucose-Capillary 165 (*) 70 - 99 mg/dL    Comment 1 Notify RN     GLUCOSE, CAPILLARY     Status: Abnormal   Collection Time   10/21/12  4:23 AM      Component Value Range Comment   Glucose-Capillary 182 (*) 70 - 99 mg/dL    Comment 1 Notify RN        HEENT: normal Cardio: irregular Resp: CTA B/L GI: BS positive Extremity:  Cyanosis Skin:   Breakdown R great toe tip ulcer, scab has been removed fresh granulation tissue without active bleeding Neuro: Lethargic, Abnormal Motor, R hand strength improved 2- grip, 3- WE R side 3-/5 in UE and 3/5 in LE and Dysarthric, aphasic Musc/Skel:  Other L BKA, R hand Dupuytrens contracture 5th digit   Assessment/Plan: 1. Functional deficits secondary to L frontal infarct which require 3+ hours per day of interdisciplinary therapy in a comprehensive inpatient rehab setting. Physiatrist is providing close team supervision and 24 hour management of active medical problems listed below. Physiatrist and rehab team continue to assess barriers to discharge/monitor patient progress toward functional and medical goals. FIM: FIM - Bathing Bathing Steps Patient Completed: Chest;Right Arm;Abdomen;Front perineal area;Buttocks;Right upper leg;Left upper leg;Right lower leg (including foot) Bathing: 4: Min-Patient completes 8-9 69f 10 parts or 75+ percent  FIM - Upper Body Dressing/Undressing Upper body dressing/undressing steps patient completed: Thread/unthread right sleeve of pullover shirt/dresss;Put  head through opening of pull over shirt/dress;Pull shirt over trunk;Thread/unthread left sleeve of pullover shirt/dress Upper body dressing/undressing: 5: Set-up assist to: Obtain clothing/put away FIM - Lower Body  Dressing/Undressing Lower body dressing/undressing steps patient completed: Thread/unthread right underwear leg;Thread/unthread left underwear leg;Pull underwear up/down;Thread/unthread right pants leg;Thread/unthread left pants leg;Pull pants up/down;Don/Doff right sock;Don/Doff right shoe Lower body dressing/undressing: 4: Min-Patient completed 75 plus % of tasks  FIM - Toileting Toileting steps completed by patient: Performs perineal hygiene;Adjust clothing prior to toileting Toileting Assistive Devices: Grab bar or rail for support Toileting: 2: Max-Patient completed 1 of 3 steps  FIM - Diplomatic Services operational officer Devices: Grab bars Toilet Transfers: 4-To toilet/BSC: Min A (steadying Pt. > 75%);4-From toilet/BSC: Min A (steadying Pt. > 75%)  FIM - Bed/Chair Transfer Bed/Chair Transfer Assistive Devices: Bed rails;Arm rests;HOB elevated Bed/Chair Transfer: 4: Supine > Sit: Min A (steadying Pt. > 75%/lift 1 leg);4: Sit > Supine: Min A (steadying pt. > 75%/lift 1 leg);4: Bed > Chair or W/C: Min A (steadying Pt. > 75%);4: Chair or W/C > Bed: Min A (steadying Pt. > 75%)  FIM - Locomotion: Wheelchair Distance: 200 Locomotion: Wheelchair: 6: Travels 150 ft or more, turns around, maneuvers to table, bed or toilet, negotiates 3% grade: maneuvers on rugs and over door sills independently FIM - Locomotion: Ambulation Locomotion: Ambulation Assistive Devices: Designer, industrial/product Ambulation/Gait Assistance: 3: Mod assist Locomotion: Ambulation: 1: Travels less than 50 ft with moderate assistance (Pt: 50 - 74%)  Comprehension Comprehension Mode: Auditory Comprehension: 5-Follows basic conversation/direction: With extra time/assistive device  Expression Expression Mode: Verbal Expression: 5-Expresses basic needs/ideas: With no assist  Social Interaction Social Interaction: 6-Interacts appropriately with others with medication or extra time (anti-anxiety,  antidepressant).  Problem Solving Problem Solving Mode: Asleep Problem Solving: 3-Solves basic 50 - 74% of the time/requires cueing 25 - 49% of the time  Memory Memory: 3-Recognizes or recalls 50 - 74% of the time/requires cueing 25 - 49% of the time  Medical Problem List and Plan:  1. Left frontal lobe embolic infarct/recent left below-knee amputation 07/22/2012 , Splinting for L hand OT 2. DVT Prophylaxis/Anticoagulation: Chronic Coumadin therapy. Monitor for any bleeding episodes  3. Pain Management: Tylenol as needed. Monitor with increased mobility  4. Neuropsych: This patient is capable of making decisions on his/her own behalf.Home med was cymbalta will resume  5. Hypertension/atrial fibrillation. Cardiac rate controlled. Continue Norvasc 10 mg daily, Lopressor 75 mg twice a day and Demadex 10 mg twice a day. Monitor with increased activity  6. Diabetes mellitus.Controlled  Hemoglobin A1c of 8.0.  Lantus insulin 27 units daily--increased to 30 qday.yesterday. Likely will need further titration of regimen. Check blood sugars a.c. and at bedtime see discussion above. Cont current dose.  D/C novalog bedtime and mealtime coverage 7. Mild compression fractures T7 and T10 as well his chronic compression fractures with cemented vertebral augmentation T11, T12 and L1. No plans for a further vertebral plasty at this time and followup as outpatient unable to do this currently since he would need to be off warfarin -advise the use of a soft lumbar-thoracic corset  -lidoderm patches  8. Left BKA- well shaped. Continue stump shrinker.  -prosthesis at some point after dc once improved from a stroke and back standpoint. 9.  Toe lesion will order neosporin ointment and drsg changes, no further bleeding  LOS (Days) 18 A FACE TO FACE EVALUATION WAS PERFORMED  Juwaun Inskeep T 10/21/2012, 6:55 AM

## 2012-10-22 ENCOUNTER — Inpatient Hospital Stay (HOSPITAL_COMMUNITY): Payer: Medicare Other | Admitting: Occupational Therapy

## 2012-10-22 ENCOUNTER — Inpatient Hospital Stay (HOSPITAL_COMMUNITY): Payer: Medicare Other | Admitting: Physical Therapy

## 2012-10-22 DIAGNOSIS — I6330 Cerebral infarction due to thrombosis of unspecified cerebral artery: Secondary | ICD-10-CM

## 2012-10-22 DIAGNOSIS — I69991 Dysphagia following unspecified cerebrovascular disease: Secondary | ICD-10-CM

## 2012-10-22 DIAGNOSIS — G8110 Spastic hemiplegia affecting unspecified side: Secondary | ICD-10-CM

## 2012-10-22 LAB — BASIC METABOLIC PANEL
BUN: 24 mg/dL — ABNORMAL HIGH (ref 6–23)
CO2: 28 mEq/L (ref 19–32)
Calcium: 8.7 mg/dL (ref 8.4–10.5)
Chloride: 98 mEq/L (ref 96–112)
Creatinine, Ser: 1.15 mg/dL (ref 0.50–1.35)
GFR calc Af Amer: 65 mL/min — ABNORMAL LOW (ref >90)
GFR calc non Af Amer: 56 mL/min — ABNORMAL LOW (ref >90)
Glucose, Bld: 355 mg/dL — ABNORMAL HIGH (ref 70–99)
Potassium: 4.1 mEq/L (ref 3.5–5.1)
Sodium: 135 mEq/L (ref 135–145)

## 2012-10-22 LAB — GLUCOSE, CAPILLARY
Glucose-Capillary: 269 mg/dL — ABNORMAL HIGH (ref 70–99)
Glucose-Capillary: 117 mg/dL — ABNORMAL HIGH (ref 70–99)
Glucose-Capillary: 196 mg/dL — ABNORMAL HIGH (ref 70–99)

## 2012-10-22 LAB — PROTIME-INR
INR: 2.26 — ABNORMAL HIGH (ref 0.00–1.49)
Prothrombin Time: 24 seconds — ABNORMAL HIGH (ref 11.6–15.2)

## 2012-10-22 NOTE — Progress Notes (Signed)
Occupational Therapy Session Note  Patient Details  Name: Aaron [name] MRN: [id number] Date of Birth: August 30, 1924  Today's Date: 10/22/2012 Time: 585 [id number] and [id number] Time Calculation (min): 42 min  Short Term Goals: Week 3:  OT Short Term Goal 1 (Week 3): STG = LTGs due to remaining LOS  Skilled Therapeutic Interventions/Progress Updates:    1) Pt seen for ADL retraining at sink with focus on sit <> stand, dynamic standing balance, and increased use of RUE with self-care task of bathing and dressing.  Pt demonstrated increased initiation and spontaneous use of RUE and sit <> stand to complete perineal hygiene and pull up pants.  Light min/steady assist in coming from sit to stand at sink and pt demonstrated spontaneous use of RUE to maintain stability on sink while washing buttocks and when pulling up pants with this therapist standing on Rt side providing close supervision-steady assist as needed for stability.  Hand over hand assist with washing LUE with mod encouragement for pt to attempt bathing with Rt hand.    2) 1:1 OT with focus on transfers to/from toilet and tub/shower.  Pt's daughter present at beginning of session and discussed follow up therapy and d/c planning.  Pt to have 24/7 hired help from CNA and daughter will be providing transportation to/from doctor's appointments.  Discussed use/purpose of shoe button and cues provided when conducting bathing and dressing session to encourage max participation.  Conducted tub/shower transfer with tub bench and min/steady assist and grab bar (pt has tub bench at home).  Toilet transfer also min/steady assist with use of BSC over toilet to provide grab bars to assist with transfer.  Pt demonstrating increased gross use of RUE with reaching when completing transfers.  Therapy Documentation Precautions:  Precautions Precautions: Fall;Back Precaution Comments: Lumbar corset to be worn OOB; LLE BKA, AFIB  Required Braces or Orthoses: Spinal  Brace Spinal Brace: Lumbar corset Restrictions Weight Bearing Restrictions: No Pain:  Pt with c/o pain in lower back, RN informed and pain patches applied.  See FIM for current functional status  Therapy/Group: Individual Therapy  [name] 10/22/2012, 9:46 AM

## 2012-10-22 NOTE — Progress Notes (Signed)
Patient ID: Aaron [name], male   DOB: [birth year], 78 y.o.   MRN: [id number]   Subjective/Complaints: Review of Systems   Feeling well. Making progress with therapies. No new problems yesterday or today thus far.  A 12 point review of systems has been performed and if not noted above is otherwise negative.   Objective: Vital Signs: Blood pressure 156/83, pulse 68, temperature 98.2 F (36.8 C), temperature source Oral, resp. rate 18, height 5\' 8"  (1.727 m), weight 70.3 kg (154 lb 15.7 oz), SpO2 98.00%. No results found. Results for orders placed during the hospital encounter of 10/03/12 (from the past 72 hour(s))  GLUCOSE, CAPILLARY     Status: Abnormal   Collection Time   10/19/12 11:57 AM      Component Value Range Comment   Glucose-Capillary 200 (*) 70 - 99 mg/dL   GLUCOSE, CAPILLARY     Status: Abnormal   Collection Time   10/19/12 12:22 PM      Component Value Range Comment   Glucose-Capillary 251 (*) 70 - 99 mg/dL   GLUCOSE, CAPILLARY     Status: Normal   Collection Time   10/19/12  4:03 PM      Component Value Range Comment   Glucose-Capillary 92  70 - 99 mg/dL   GLUCOSE, CAPILLARY     Status: Abnormal   Collection Time   10/19/12  8:53 PM      Component Value Range Comment   Glucose-Capillary 139 (*) 70 - 99 mg/dL   GLUCOSE, CAPILLARY     Status: Abnormal   Collection Time   10/20/12 12:08 AM      Component Value Range Comment   Glucose-Capillary 255 (*) 70 - 99 mg/dL    Comment 1 Notify RN     GLUCOSE, CAPILLARY     Status: Abnormal   Collection Time   10/20/12  3:57 AM      Component Value Range Comment   Glucose-Capillary 256 (*) 70 - 99 mg/dL    Comment 1 Notify RN     GLUCOSE, CAPILLARY     Status: Abnormal   Collection Time   10/20/12  7:49 AM      Component Value Range Comment   Glucose-Capillary 315 (*) 70 - 99 mg/dL   GLUCOSE, CAPILLARY     Status: Abnormal   Collection Time   10/20/12 11:25 AM      Component Value Range Comment   Glucose-Capillary 191  (*) 70 - 99 mg/dL   GLUCOSE, CAPILLARY     Status: Abnormal   Collection Time   10/20/12  4:54 PM      Component Value Range Comment   Glucose-Capillary 194 (*) 70 - 99 mg/dL   GLUCOSE, CAPILLARY     Status: Abnormal   Collection Time   10/20/12  8:54 PM      Component Value Range Comment   Glucose-Capillary 156 (*) 70 - 99 mg/dL    Comment 1 Notify RN     GLUCOSE, CAPILLARY     Status: Abnormal   Collection Time   10/20/12 11:52 PM      Component Value Range Comment   Glucose-Capillary 165 (*) 70 - 99 mg/dL    Comment 1 Notify RN     GLUCOSE, CAPILLARY     Status: Abnormal   Collection Time   10/21/12  4:23 AM      Component Value Range Comment   Glucose-Capillary 182 (*) 70 - 99 mg/dL    Comment 1  Notify RN     GLUCOSE, CAPILLARY     Status: Abnormal   Collection Time   10/21/12  7:46 AM      Component Value Range Comment   Glucose-Capillary 181 (*) 70 - 99 mg/dL   GLUCOSE, CAPILLARY     Status: Abnormal   Collection Time   10/21/12 11:28 AM      Component Value Range Comment   Glucose-Capillary 357 (*) 70 - 99 mg/dL   GLUCOSE, CAPILLARY     Status: Abnormal   Collection Time   10/21/12  4:22 PM      Component Value Range Comment   Glucose-Capillary 60 (*) 70 - 99 mg/dL   GLUCOSE, CAPILLARY     Status: Abnormal   Collection Time   10/21/12  5:54 PM      Component Value Range Comment   Glucose-Capillary 166 (*) 70 - 99 mg/dL   GLUCOSE, CAPILLARY     Status: Abnormal   Collection Time   10/21/12  8:59 PM      Component Value Range Comment   Glucose-Capillary 222 (*) 70 - 99 mg/dL    Comment 1 Notify RN     GLUCOSE, CAPILLARY     Status: Abnormal   Collection Time   10/21/12 11:58 PM      Component Value Range Comment   Glucose-Capillary 320 (*) 70 - 99 mg/dL    Comment 1 Notify RN     GLUCOSE, CAPILLARY     Status: Abnormal   Collection Time   10/22/12  4:26 AM      Component Value Range Comment   Glucose-Capillary 269 (*) 70 - 99 mg/dL    Comment 1 Notify RN       PROTIME-INR     Status: Abnormal   Collection Time   10/22/12  7:30 AM      Component Value Range Comment   Prothrombin Time 24.0 (*) 11.6 - 15.2 seconds    INR 2.26 (*) 0.00 - 1.49      HEENT: normal Cardio: irregular Resp: CTA B/L GI: BS positive Extremity:  Cyanosis Skin:   Breakdown R great toe tip ulcer, scab has been removed fresh granulation tissue without active bleeding Neuro: Lethargic, Abnormal Motor, R hand strength improved 2- grip, 3- WE R side 3-/5 in UE and 3/5 in LE and Dysarthric, aphasic Musc/Skel:  Other L BKA, R hand Dupuytrens contracture 5th digit   Assessment/Plan: 1. Functional deficits secondary to L frontal infarct which require 3+ hours per day of interdisciplinary therapy in a comprehensive inpatient rehab setting. Physiatrist is providing close team supervision and 24 hour management of active medical problems listed below. Physiatrist and rehab team continue to assess barriers to discharge/monitor patient progress toward functional and medical goals. FIM: FIM - Bathing Bathing Steps Patient Completed: Chest;Right Arm;Abdomen (elected not to wash legs or feet today) Bathing: 4: Min-Patient completes 8-9 4f 10 parts or 75+ percent  FIM - Upper Body Dressing/Undressing Upper body dressing/undressing steps patient completed: Thread/unthread right sleeve of front closure shirt/dress;Thread/unthread left sleeve of front closure shirt/dress;Pull shirt around back of front closure shirt/dress Upper body dressing/undressing: 4: Min-Patient completed 75 plus % of tasks FIM - Lower Body Dressing/Undressing Lower body dressing/undressing steps patient completed: Thread/unthread right pants leg;Thread/unthread left pants leg (sock, R shoe&undwear already donned before OT session) Lower body dressing/undressing: 3: Mod-Patient completed 50-74% of tasks (patient did not assist with pulling pants over hips)  FIM - Toileting Toileting steps completed by patient:  Performs perineal hygiene Toileting Assistive Devices: Grab bar or rail for support Toileting: 0: Activity did not occur  FIM - Diplomatic Services operational officer Devices: Grab bars Toilet Transfers: 0-Activity did not occur  FIM - Banker Devices: Bed rails;Arm rests;HOB elevated Bed/Chair Transfer: 0: Activity did not occur  FIM - Locomotion: Wheelchair Distance: 200 Locomotion: Wheelchair: 6: Travels 150 ft or more, turns around, maneuvers to table, bed or toilet, negotiates 3% grade: maneuvers on rugs and over door sills independently FIM - Locomotion: Ambulation Locomotion: Ambulation Assistive Devices: Designer, industrial/product Ambulation/Gait Assistance: 3: Mod assist Locomotion: Ambulation: 1: Travels less than 50 ft with moderate assistance (Pt: 50 - 74%)  Comprehension Comprehension Mode: Auditory Comprehension: 5-Follows basic conversation/direction: With no assist  Expression Expression Mode: Verbal Expression: 5-Expresses basic needs/ideas: With no assist  Social Interaction Social Interaction: 6-Interacts appropriately with others with medication or extra time (anti-anxiety, antidepressant).  Problem Solving Problem Solving Mode: Asleep Problem Solving: 5-Solves basic 90% of the time/requires cueing < 10% of the time  Memory Memory: 5-Recognizes or recalls 90% of the time/requires cueing < 10% of the time  Medical Problem List and Plan:  1. Left frontal lobe embolic infarct/recent left below-knee amputation 07/22/2012 , Splinting for L hand OT 2. DVT Prophylaxis/Anticoagulation: Chronic Coumadin therapy. Monitor for any bleeding episodes  3. Pain Management: Tylenol as needed. Monitor with increased mobility  4. Neuropsych: This patient is capable of making decisions on his/her own behalf.Home med was cymbalta will resume  5. Hypertension/atrial fibrillation. Cardiac rate controlled. Continue Norvasc 10 mg daily,  Lopressor 75 mg twice a day and Demadex 10 mg twice a day. Monitor with increased activity  6. Diabetes mellitus.Controlled  Hemoglobin A1c of 8.0.  Lantus insulin 27 units daily--increased to 30 qday.11/2. Likely will need further titration of regimen. Check blood sugars a.c. and at bedtime see discussion above. Cont current dose.  SSI, brittle, Ask Endo to eval 7. Mild compression fractures T7 and T10 as well his chronic compression fractures with cemented vertebral augmentation T11, T12 and L1. No plans for a further vertebral plasty at this time and followup as outpatient unable to do this currently since he would need to be off warfarin -advise the use of a soft lumbar-thoracic corset  -lidoderm patches  8. Left BKA- well shaped. Continue stump shrinker.  -prosthesis at some point after dc once improved from a stroke and back standpoint. 9.  Toe lesion will order neosporin ointment and drsg changes, no further bleeding  LOS (Days) 19 A FACE TO FACE EVALUATION WAS PERFORMED  Nasean Zapf E 10/22/2012, 8:37 AM

## 2012-10-22 NOTE — Progress Notes (Signed)
Physical Therapy Session Note  Patient Details  Name: Aaron [name] MRN: [id number] Date of Birth: 02/26/1924  Today's Date: 10/22/2012 Time: 1000-1100 Time Calculation (min): 60 min  Short Term Goals: Week 2:  PT Short Term Goal 1 (Week 2): Patient will perform bed mobility with HOB flat, no bed rails, and supervision to simulate home environment PT Short Term Goal 1 - Progress (Week 2): Met PT Short Term Goal 2 (Week 2): Patient will perform supine > sit transfers with HOB flat, no bed rails, and supervision PT Short Term Goal 2 - Progress (Week 2): Met PT Short Term Goal 3 (Week 2): Patient will perform squat pivot bed <> w/c transfer consistently with min A  PT Short Term Goal 3 - Progress (Week 2): Progressing toward goal PT Short Term Goal 4 (Week 2): Patient will demonstrate improvement in standing tolerance by standing for >5 min without rest break PT Short Term Goal 4 - Progress (Week 2): Met  Therapy Documentation Precautions:  Precautions Precautions: Fall Precaution Comments: Lumbar corset to be worn OOB; LLE BKA, AFIB  Required Braces or Orthoses: Spinal Brace Spinal Brace: Lumbar corset Restrictions Weight Bearing Restrictions: No Pain: Pain Assessment Pain Assessment: No/denies pain Exercises:   Other Treatments:   Patient lying in bed upon PT arrival. Patient performed supine > sit with supervision. Patient sat at EOB and shoe was donned. Patient performed bed > w/c transfer to the R with min A.  Patient propelled w/c ~180 feet from room < > PT gym independently without needing to stop to rest. Patient needed longer sitting rest break once in gym.  Patient performed squat pivot transfer from w/c > mat to the R with min A for safety. Patient continues to have increased difficulty pivoting R foot when transferring to the R, resulting in needing A.  Patient performed standing hip abduction and extension on L x 10 reps x 2 sets requiring min guard for safety. Patient  required sitting rest break afterwards.  Patient performed low rows in sitting with blue theraband using BUEs x 10 reps x 2 sets with emphasis on strengthening and improving posture. Patient performed forward trunk lean to target with cane in BUEs x 5 reps x 2 sets with emphasis on posture and initiation of anterior trunk lean for transfers. Patient requires min vcs for technique/sequencing. Patient performed the following exercises in supine x 10 reps x 2 sets with emphasis on RLE strengthening: hip abd/ER with blue theraband, glut sets with 3 sec hold. Patient required multiple rest breaks throughout exercises secondary to decreased activity tolerance.  Patient performed squat pivot transfer from mat > w/c x 3 reps with min guard (to the L) to min A (to the R). Patient required min vcs for sequencing, technique and safety.  Patient performed squat pivot transfer from w/c to bed once back in room, requiring min A. Patient performed sit > supine with supervision. Patient left lying in bed with call bell in reach.  PM session: Patient lying in bed upon PT arrival. Patient performed supine > sit with supervision. Patient performed squat pivot transfer to R with min A. Patient propelled w/c ~180 feet from room > PT gym independently. Patient performed sit > stand to high/low mat and remained standing as he participated in checkers game. Task was performed with emphasis on using RUE, improve standing balance, increase activity tolerance, and strengthening of LLE. Patient able to stand for 2 1/2 min, 5 min, 8 min requiring sitting rest breaks between  each stand. Patient presents with increased strength and posture while standing performing task.  Patient returned to room and performed w/c <> bed stand pivot transfers x 4 reps, requiring min A. Patient progressing well with transfers.  Patient requested to remain in bed at end of session. Patient performed sit > supine with supervision. Patient left lying in bed  with call bell in reach.   See FIM for current functional status  Therapy/Group: Individual Therapy  Shaniqua Guillot Hamilton DPT Student  10/22/2012, 10:44 AM

## 2012-10-22 NOTE — Progress Notes (Signed)
Inpatient Diabetes Program Recommendations  AACE/ADA: New Consensus Statement on Inpatient Glycemic Control (2013)  Target Ranges:  Prepandial:   less than 140 mg/dL      Peak postprandial:   less than 180 mg/dL (1-2 hours)      Critically ill patients:  140 - 180 mg/dL   Reason for Visit: Hyperglycemia requiring high doses correction, then creating potential for hypoglycemia  Inpatient Diabetes Program Recommendations Insulin - Basal: xxxxxxxxxxxx Correction (SSI): Please add HS correction scale which begins with 2 units at 200 mg/dL. Fasting glucose is high primarily when HS cbg is high. Insulin - Meal Coverage: xxxxxxxxxxxxxxxxx  Note: Thank you, Lenor Coffin, RN, CNS, Diabetes Coordinator [phone])

## 2012-10-22 NOTE — Progress Notes (Signed)
ANTICOAGULATION CONSULT NOTE - Follow Up Consult  Pharmacy Consult for coumadin  Indication: atrial fibrillation, secondary stroke prophylaxis  Patient Measurements: Height: 5\' 8"  (172.7 cm) Weight: 154 lb 15.7 oz (70.3 kg) IBW/kg (Calculated) : 68.4   Vital Signs: Temp: 98.2 F (36.8 C) (11/04 0500) Temp src: Oral (11/04 0500) BP: 156/83 mmHg (11/04 0500) Pulse Rate: 68  (11/04 0500)  Labs:  Basename 10/22/12 0936 10/22/12 0730  HGB -- --  HCT -- --  PLT -- --  APTT -- --  LABPROT -- 24.0*  INR -- 2.26*  HEPARINUNFRC -- --  CREATININE 1.15 --  CKTOTAL -- --  CKMB -- --  TROPONINI -- --   Estimated Creatinine Clearance: 44.6 ml/min (by C-G formula based on Cr of 1.15).  Assessment: 78 y/o male patient with history of afib and stroke on coumadin PTA (2.5mg /day).  Patient was on Pradaxa but family prefers coumadin. INR today remains therapeutic.  Goal of Therapy:  INR 2-3 Monitor platelets by anticoagulation protocol: Yes  Plan:  Continue warfarin 2.5mg  daily as at home.  Check INR 11/4 (M-W-F).  Estella Husk, Pharm.D., BCPS Clinical Pharmacist  Phone [phone] Pager [phone] 10/22/2012, 11:02 AM

## 2012-10-22 NOTE — Progress Notes (Signed)
I have read and agree with the following treatment session.  Hazelle Woollard Hall, PT, DPT 

## 2012-10-23 ENCOUNTER — Inpatient Hospital Stay (HOSPITAL_COMMUNITY): Payer: Medicare Other | Admitting: Physical Therapy

## 2012-10-23 ENCOUNTER — Inpatient Hospital Stay (HOSPITAL_COMMUNITY): Payer: Medicare Other | Admitting: Occupational Therapy

## 2012-10-23 DIAGNOSIS — G8110 Spastic hemiplegia affecting unspecified side: Secondary | ICD-10-CM

## 2012-10-23 DIAGNOSIS — I6330 Cerebral infarction due to thrombosis of unspecified cerebral artery: Secondary | ICD-10-CM

## 2012-10-23 DIAGNOSIS — I69991 Dysphagia following unspecified cerebrovascular disease: Secondary | ICD-10-CM

## 2012-10-23 LAB — GLUCOSE, CAPILLARY
Glucose-Capillary: 213 mg/dL — ABNORMAL HIGH (ref 70–99)
Glucose-Capillary: 155 mg/dL — ABNORMAL HIGH (ref 70–99)
Glucose-Capillary: 48 mg/dL — ABNORMAL LOW (ref 70–99)
Glucose-Capillary: 123 mg/dL — ABNORMAL HIGH (ref 70–99)

## 2012-10-23 MED ORDER — LIDOCAINE 5 % EX PTCH
2.0000 | MEDICATED_PATCH | CUTANEOUS | Status: DC
  Administered 2012-10-24: 2 via TRANSDERMAL
  Filled 2012-10-23: qty 2

## 2012-10-23 NOTE — Progress Notes (Signed)
Physical Therapy Discharge Summary  Patient Details  Name: Flavio Millender MRN: [id number] Date of Birth: [birth year]  Today's Date: 10/23/2012 Time: 1000-1045; 1300-1400 Time Calculation (min): 45 min; 60 min  Patient has met 7 of 7 long term goals due to improved activity tolerance, improved balance, improved postural control, increased strength, ability to compensate for deficits, functional use of  right upper extremity and right lower extremity, improved awareness and improved coordination.  Patient to discharge at a wheelchair level Supervision.   Patient's will have 24/7 assistance at home, with intermittent care from daughter who will take patient to doctors appointments, etc. Daughter is able to provide the necessary physical assistance at discharge.  Recommendation:  Patient will benefit from ongoing skilled PT services in home health setting to continue to advance safe functional mobility, address ongoing impairments in decreased strength, decreased activity tolerance, impaired balance and postural control, and minimize fall risk.  Equipment: No equipment provided - patient has all necessary DME  Reasons for discharge: discharge from hospital  Patient/family agrees with progress made and goals achieved: Yes  PT Discharge Precautions/Restrictions Precautions Precautions: Fall Restrictions Weight Bearing Restrictions: No   Pain Pain Assessment Pain Assessment: Faces Faces Pain Scale: Hurts a little bit Pain Type: Acute pain Pain Location: Back Pain Orientation: Lower Pain Descriptors: Discomfort;Aching Pain Onset: Gradual Patients Stated Pain Goal: 2 Pain Intervention(s): RN made aware Sensation Sensation Light Touch: Appears Intact Motor  Motor Motor: Hemiplegia;Abnormal postural alignment and control Motor - Discharge Observations: Patient has shown great progress with RLE function. Continues to have mild weakness effecting motor abilities.   Mobility Bed  Mobility Bed Mobility: Rolling Right;Rolling Left;Supine to Sit;Sit to Supine Rolling Right: 5: Supervision Rolling Left: 5: Supervision Supine to Sit: 5: Supervision Sitting - Scoot to Edge of Bed: 5: Supervision Sit to Supine: 5: Supervision Extremity Assessment      RLE Assessment RLE Assessment: Within Functional Limits RLE Strength Right Hip Flexion:  (4+/5) Right Hip ABduction: 4/5 Right Hip ADduction: 4/5 Right Knee Flexion:  (4+/5) Right Knee Extension:  (4+/5) Right Ankle Dorsiflexion: 4/5 LLE Assessment LLE Assessment: Within Functional Limits LLE Strength Left Hip Flexion: 5/5 Left Hip ABduction: 4/5 Left Hip ADduction: 4/5  AM session: Patient taken gym in w/c. Patient performed squat pivot transfer from w/c <> mat with min A. Patient's strength and sensation assessed - see above for details.  Patient performed the following UE exercises in sitting x 10 reps x 2 sets: low rows with blue theraband, chest press using 1# bar and green theraband for resistance. Patient requires min vcs for posture, technique/form.  Patient performed anterior trunk leans with 1# bar x 5 reps with emphasis on maintaining erect sitting posture and performing anterior lean to achieve anterior pelvic tilt. Patient performed the following exercises in supine with back supported x 10 reps: bilateral hip abd with blue theraband, R APs with resisted PF using blue theraband, glue sets with 3 sec hold. Patient required min vcs for technique and encouragement. Patient returned to room in w/c. Patient performed stand pivot transfer to L from w/c > bed. Patient performed bilateral rolling with supervision. Patient left in bed, call bell in reach, bed alarm set.   PM Session: Patient propelled from room > PT gym independently, demonstrating ability to manage locks and foot rests without A. Patient performed sit > stand with min guard. Patient performed L hip extension x 10 reps x 2 sets; L hip abduction x 10  reps x 2 sets with  min A for steadying/safety.  Patient was taken downstairs to practice real car transfer with daughter. Patient performed w/c <> car transfer x 3 reps with daughter assisting with 2 of the reps. Patient requires min A for steadying/safety and min vcs for technique/form. Patient and daughter verbalized and demonstrated understanding.  Patient taken back to room and performed stand pivot transfer from w/c <> bed with min A x 3 reps, requiring min vcs when going to the R. [name] was educated on patient's difficulty going to the R vs the L and verbalized understanding that patient may require more assist when transferring to the R. Patient expressed concerns of not being able to get a prosthesis secondary to being told that he was not a good candidate. Patient and daughter educated on importance of patient maintaining strength, building endurance, and participating with therapy.  Patient lying in bed at end of session with call bell in reach, daughter at bedside.   See FIM for current functional status  Mubarak Bevens 10/23/2012, 11:03 AM

## 2012-10-23 NOTE — Progress Notes (Signed)
Inpatient Diabetes Program Recommendations  AACE/ADA: New Consensus Statement on Inpatient Glycemic Control (2013)  Target Ranges:  Prepandial:   less than 140 mg/dL      Peak postprandial:   less than 180 mg/dL (1-2 hours)      Critically ill patients:  140 - 180 mg/dL   Reason for Visit: Glucose hyperglycemia and hypoglycemia  Inpatient Diabetes Program Recommendations Insulin - Basal: Continue at 30 units Lantus Correction (SSI): Due to the labiile glucose trend, recommend a decrease to sensitive correction tidwc. Insulin - Meal Coverage: xxxxxxxxxxxxxxxxx  Note: Thank you, Lenor Coffin, RN, CNS, Diabetes Coordinator [phone])

## 2012-10-23 NOTE — Progress Notes (Signed)
Occupational Therapy Session Note  Patient Details  Name: Aaron [name] MRN: [id number] Date of Birth: 03-Dec-1924  Today's Date: 10/23/2012 Time: 4098-1191 Time Calculation (min): 45 min  Short Term Goals: Week 3:  OT Short Term Goal 1 (Week 3): STG = LTGs due to remaining LOS  Skilled Therapeutic Interventions/Progress Updates:    1) Pt completed ADL retraining at overall min assist level.  Focus on sit <> stand, dynamic standing balance, and increased use of RUE with self-care tasks of bathing and dressing.  Pt demonstrated increased initiation with sit <> stand at sink side with bathing, requiring only min/steady assist in standing while pt completed bathing perineal area and when pulling up pants.  Pt demonstrated increased use of RUE with stabilization in standing at sink and occasional use with assisting in washing.  Hand over hand assist with washing Left arm with Rt hand.    2) 1:1 OT with focus on family education with pt's daughter.  Discussed verbal and tactile cues provided to encourage pt with participation in self-care tasks.  Engaged in sit to stand at sink to demonstrate to daughter safe positioning and cues.  Pt completed simple grooming task in standing with min/steady support and hand over hand with grasping item in Rt hand.  Pt's daughter left during session to take a phone call.  Engaged in toilet transfers with focus on hand placement and safe/slow descent to toilet.  Pt's daughter returned at end of session, again discussed cues to provide to encourage him to increase participation.  Therapy Documentation Precautions:  Precautions Precautions: Fall Precaution Comments: Lumbar corset to be worn OOB; LLE BKA, AFIB  Required Braces or Orthoses: Spinal Brace Spinal Brace: Lumbar corset Restrictions Weight Bearing Restrictions: No Pain: Pain Assessment Pain Assessment: No/denies pain ADL: ADL Equipment Provided: Other (comment) (shoe button) Eating: Set up Where  Assessed-Eating: Wheelchair Grooming: Supervision/safety Where Assessed-Grooming: Sitting at sink Upper Body Bathing: Minimal cueing;Minimal assistance Where Assessed-Upper Body Bathing: Sitting at sink Lower Body Bathing: Minimal cueing;Minimal assistance Where Assessed-Lower Body Bathing: Standing at sink;Sitting at sink Upper Body Dressing: Supervision/safety;Setup Where Assessed-Upper Body Dressing: Sitting at sink Lower Body Dressing: Minimal cueing;Minimal assistance Where Assessed-Lower Body Dressing: Sitting at sink;Standing at sink Toileting: Minimal assistance Where Assessed-Toileting: Teacher, adult education: Curator Method: Surveyor, minerals: Engineer, technical sales: International aid/development worker Method: Engineer, technical sales: Emergency planning/management officer ADL Comments: Pt requires max encouragement to attempt pulling up/down pants in standing with toileting and LB dressing (He will let people help him even with tasks he can do).  See FIM for current functional status  Therapy/Group: Individual Therapy  [name] 10/23/2012, 3:55 PM

## 2012-10-23 NOTE — Discharge Summary (Signed)
  Discharge summary job [id number]

## 2012-10-23 NOTE — Progress Notes (Signed)
Patient ID: Aaron [name], male   DOB: [birth year], 78 y.o.   MRN: [id number]   Subjective/Complaints: I am leaving tomorrow. Pt will f/u with endocrine next week.  At home overall goal is ~200 per daughter   Feeling well. Making progress with therapies. No new problems yesterday or today thus far.  A 12 point review of systems has been performed and if not noted above is otherwise negative.   Objective: Vital Signs: Blood pressure 151/68, pulse 63, temperature 97.9 F (36.6 C), temperature source Oral, resp. rate 17, height 5\' 8"  (1.727 m), weight 70.3 kg (154 lb 15.7 oz), SpO2 97.00%. No results found. Results for orders placed during the hospital encounter of 10/03/12 (from the past 72 hour(s))  GLUCOSE, CAPILLARY     Status: Abnormal   Collection Time   10/20/12 11:25 AM      Component Value Range Comment   Glucose-Capillary 191 (*) 70 - 99 mg/dL   GLUCOSE, CAPILLARY     Status: Abnormal   Collection Time   10/20/12  4:54 PM      Component Value Range Comment   Glucose-Capillary 194 (*) 70 - 99 mg/dL   GLUCOSE, CAPILLARY     Status: Abnormal   Collection Time   10/20/12  8:54 PM      Component Value Range Comment   Glucose-Capillary 156 (*) 70 - 99 mg/dL    Comment 1 Notify RN     GLUCOSE, CAPILLARY     Status: Abnormal   Collection Time   10/20/12 11:52 PM      Component Value Range Comment   Glucose-Capillary 165 (*) 70 - 99 mg/dL    Comment 1 Notify RN     GLUCOSE, CAPILLARY     Status: Abnormal   Collection Time   10/21/12  4:23 AM      Component Value Range Comment   Glucose-Capillary 182 (*) 70 - 99 mg/dL    Comment 1 Notify RN     GLUCOSE, CAPILLARY     Status: Abnormal   Collection Time   10/21/12  7:46 AM      Component Value Range Comment   Glucose-Capillary 181 (*) 70 - 99 mg/dL   GLUCOSE, CAPILLARY     Status: Abnormal   Collection Time   10/21/12 11:28 AM      Component Value Range Comment   Glucose-Capillary 357 (*) 70 - 99 mg/dL   GLUCOSE, CAPILLARY      Status: Abnormal   Collection Time   10/21/12  4:22 PM      Component Value Range Comment   Glucose-Capillary 60 (*) 70 - 99 mg/dL   GLUCOSE, CAPILLARY     Status: Abnormal   Collection Time   10/21/12  5:54 PM      Component Value Range Comment   Glucose-Capillary 166 (*) 70 - 99 mg/dL   GLUCOSE, CAPILLARY     Status: Abnormal   Collection Time   10/21/12  8:59 PM      Component Value Range Comment   Glucose-Capillary 222 (*) 70 - 99 mg/dL    Comment 1 Notify RN     GLUCOSE, CAPILLARY     Status: Abnormal   Collection Time   10/21/12 11:58 PM      Component Value Range Comment   Glucose-Capillary 320 (*) 70 - 99 mg/dL    Comment 1 Notify RN     GLUCOSE, CAPILLARY     Status: Abnormal   Collection Time  10/22/12  4:26 AM      Component Value Range Comment   Glucose-Capillary 269 (*) 70 - 99 mg/dL    Comment 1 Notify RN     PROTIME-INR     Status: Abnormal   Collection Time   10/22/12  7:30 AM      Component Value Range Comment   Prothrombin Time 24.0 (*) 11.6 - 15.2 seconds    INR 2.26 (*) 0.00 - 1.49   GLUCOSE, CAPILLARY     Status: Abnormal   Collection Time   10/22/12  7:30 AM      Component Value Range Comment   Glucose-Capillary 241 (*) 70 - 99 mg/dL   BASIC METABOLIC PANEL     Status: Abnormal   Collection Time   10/22/12  9:36 AM      Component Value Range Comment   Sodium 135  135 - 145 mEq/L    Potassium 4.1  3.5 - 5.1 mEq/L    Chloride 98  96 - 112 mEq/L    CO2 28  19 - 32 mEq/L    Glucose, Bld 355 (*) 70 - 99 mg/dL    BUN 24 (*) 6 - 23 mg/dL    Creatinine, Ser 8.11  0.50 - 1.35 mg/dL    Calcium 8.7  8.4 - 91.4 mg/dL    GFR calc non Af Amer 56 (*) >90 mL/min    GFR calc Af Amer 65 (*) >90 mL/min   GLUCOSE, CAPILLARY     Status: Abnormal   Collection Time   10/22/12 11:39 AM      Component Value Range Comment   Glucose-Capillary 183 (*) 70 - 99 mg/dL    Comment 1 Notify RN     GLUCOSE, CAPILLARY     Status: Abnormal   Collection Time   10/22/12  4:14 PM       Component Value Range Comment   Glucose-Capillary 117 (*) 70 - 99 mg/dL   GLUCOSE, CAPILLARY     Status: Abnormal   Collection Time   10/22/12  8:26 PM      Component Value Range Comment   Glucose-Capillary 196 (*) 70 - 99 mg/dL   GLUCOSE, CAPILLARY     Status: Abnormal   Collection Time   10/23/12 12:11 AM      Component Value Range Comment   Glucose-Capillary 213 (*) 70 - 99 mg/dL    Comment 1 Notify RN     GLUCOSE, CAPILLARY     Status: Abnormal   Collection Time   10/23/12  4:07 AM      Component Value Range Comment   Glucose-Capillary 304 (*) 70 - 99 mg/dL    Comment 1 Notify RN     GLUCOSE, CAPILLARY     Status: Abnormal   Collection Time   10/23/12  7:28 AM      Component Value Range Comment   Glucose-Capillary 217 (*) 70 - 99 mg/dL    Comment 1 Notify RN        HEENT: normal Cardio: irregular Resp: CTA B/L GI: BS positive Extremity:  Cyanosis Skin:   Breakdown R great toe tip ulcer, scab has been removed fresh granulation tissue without active bleeding Neuro: Lethargic, Abnormal Motor, R hand strength improved 2- grip, 3- WE R side 3-/5 in UE and 3/5 in LE and Dysarthric, aphasic Musc/Skel:  Other L BKA, R hand Dupuytrens contracture 5th digit   Assessment/Plan: 1. Functional deficits secondary to L frontal infarct should be ready for  D/C in amFIM: FIM - Bathing Bathing Steps Patient Completed: Chest;Right Arm;Abdomen;Front perineal area;Buttocks;Right upper leg;Left upper leg Bathing: 4: Min-Patient completes 8-9 51f 10 parts or 75+ percent  FIM - Upper Body Dressing/Undressing Upper body dressing/undressing steps patient completed: Thread/unthread right sleeve of pullover shirt/dresss;Thread/unthread left sleeve of pullover shirt/dress;Put head through opening of pull over shirt/dress;Pull shirt over trunk Upper body dressing/undressing: 5: Set-up assist to: Obtain clothing/put away FIM - Lower Body Dressing/Undressing Lower body dressing/undressing steps  patient completed: Thread/unthread right underwear leg;Thread/unthread left underwear leg;Pull underwear up/down;Thread/unthread right pants leg;Thread/unthread left pants leg Lower body dressing/undressing: 3: Mod-Patient completed 50-74% of tasks  FIM - Toileting Toileting steps completed by patient: Performs perineal hygiene Toileting Assistive Devices: Grab bar or rail for support Toileting: 0: Activity did not occur  FIM - Diplomatic Services operational officer Devices: Grab bars Toilet Transfers: 4-To toilet/BSC: Min A (steadying Pt. > 75%);4-From toilet/BSC: Min A (steadying Pt. > 75%)  FIM - Bed/Chair Transfer Bed/Chair Transfer Assistive Devices: Bed rails;Arm rests;HOB elevated Bed/Chair Transfer: 4: Supine > Sit: Min A (steadying Pt. > 75%/lift 1 leg);4: Sit > Supine: Min A (steadying pt. > 75%/lift 1 leg);4: Bed > Chair or W/C: Min A (steadying Pt. > 75%);4: Chair or W/C > Bed: Min A (steadying Pt. > 75%)  FIM - Locomotion: Wheelchair Distance: 200 Locomotion: Wheelchair: 6: Travels 150 ft or more, turns around, maneuvers to table, bed or toilet, negotiates 3% grade: maneuvers on rugs and over door sills independently FIM - Locomotion: Ambulation Locomotion: Ambulation Assistive Devices: Designer, industrial/product Ambulation/Gait Assistance: 3: Mod assist Locomotion: Ambulation: 0: Activity did not occur  Comprehension Comprehension Mode: Auditory Comprehension: 5-Follows basic conversation/direction: With extra time/assistive device  Expression Expression Mode: Verbal Expression: 5-Expresses basic needs/ideas: With no assist  Social Interaction Social Interaction: 5-Interacts appropriately 90% of the time - Needs monitoring or encouragement for participation or interaction.  Problem Solving Problem Solving Mode: Asleep Problem Solving: 5-Solves basic 90% of the time/requires cueing < 10% of the time  Memory Memory: 4-Recognizes or recalls 75 - 89% of the time/requires  cueing 10 - 24% of the time  Medical Problem List and Plan:  1. Left frontal lobe embolic infarct/recent left below-knee amputation 07/22/2012 , Splinting for L hand OT.  Discussed with patient poor candidate for functional prosthetic use, pt seemed surprised 2. DVT Prophylaxis/Anticoagulation: Chronic Coumadin therapy. Monitor for any bleeding episodes  3. Pain Management: Tylenol as needed. Monitor with increased mobility  4. Neuropsych: This patient is capable of making decisions on his/her own behalf.Home med was cymbalta will resume  5. Hypertension/atrial fibrillation. Cardiac rate controlled. Continue Norvasc 10 mg daily, Lopressor 75 mg twice a day and Demadex 10 mg twice a day. Monitor with increased activity  6. Diabetes mellitus.Controlled  Hemoglobin A1c of 8.0.  Lantus insulin 27 units daily--increased to 30 qday.11/2. Likely will need further titration of regimen. Check blood sugars a.c. and at bedtime see discussion above. Cont current dose.  SSI, brittle, Ask Endo to eval 7. Mild compression fractures T7 and T10 as well his chronic compression fractures with cemented vertebral augmentation T11, T12 and L1. No plans for a further vertebral plasty at this time and followup as outpatient unable to do this currently since he would need to be off warfarin -advise the use of a soft lumbar-thoracic corset  -lidoderm patches  8. Left BKA- well shaped. Continue stump shrinker.  -prosthesis at some point after dc once improved from a stroke and back standpoint.  9.  Toe lesion will order neosporin ointment and drsg changes, no further bleeding  LOS (Days) 20 A FACE TO FACE EVALUATION WAS PERFORMED  KIRSTEINS,ANDREW E 10/23/2012, 8:16 AM

## 2012-10-23 NOTE — Progress Notes (Signed)
I have read and agree with the following discharge and treatment session.  Edman Circle, PT, DPT

## 2012-10-23 NOTE — Discharge Summary (Signed)
[name], [name] NO.:  [id number]  MEDICAL RECORD NO.:  [id number]  LOCATION:  4035                         FACILITY:  [facility]  PHYSICIAN:  Erick Colace, M.D.DATE OF BIRTH:  1924/06/06  DATE OF ADMISSION:  10/03/2012 DATE OF DISCHARGE:  10/24/2012                              DISCHARGE SUMMARY   DISCHARGE DIAGNOSES: 1. Left frontal lobe embolic infarction. 2. Recent left below-knee amputation. 3. Chronic Coumadin therapy for atrial fibrillation, hypertension,     diabetes mellitus, mild compression fractures T7 and T10.  This is an 78 year old right-handed male with history of diabetes mellitus, peripheral neuropathy, left above-knee amputation August 01, 2012, at [facility], was discharged to [facility] before returning home with 24-hour care.  Also with history of atrial fibrillation, maintained on chronic Coumadin therapy.  Admitted September 25, 2012, with inability to move right side and inability to speak.  The patient had been on chronic Coumadin, recently held after INR 3.1 and INR subtherapeutic at admission of 1.35.  CT of the head revealing remote right occipital cerebellar infarcts and global atrophy.  CTA of head and neck with high- grade stenosis of right-VA, distal VA essentially occluded, 50% stenosis proximal left ICAs, moderate supraclinoid ICA stenosis bilaterally.  MRI of brain done revealing acute infarct posterior frontal lobe.  The patient had improvement of symptoms in the emergency department.  He started to speak and move his right upper extremity.  An echocardiogram done revealed mild left ventricular hypertrophy, ejection fraction of 65- 70% with bioprosthetic AV present.  Neurology consulted.  First recommendation was to begin Pradaxa, but the family still preferred Coumadin therapy.  Thus Coumadin was resumed.  The patient did not receive tPA.  The patient was maintained  on a dysphagia 3 diet.  Noted complaints of low back pain.  X-rays and imaging revealed mild compression fractures at T7 and T10 of intermediate age as well as compression chronic fractures T11, T12 and L1.  It was initially planned for possible vertebroplasty which had been held due to INR being more than 1.5.  It was later advised hold on vertebroplasty at that time and to begin rehab therapy in view of recent stroke.  The patient was admitted for comprehensive rehab program.  PAST MEDICAL HISTORY:  See discharge diagnoses.  SOCIAL HISTORY:  Lives alone with a personal care attendant 24 hours a day.  He has a ramp to the entrance of the home.  Functional history prior to admission was needing minimal assist for dressing.  He does not drive.  He is retired.  Functional status upon admission to rehab services was +2 total assist for stand pivot transfers.  PHYSICAL EXAMINATION:  VITAL SIGNS:  Blood pressure 148/64, pulse 91, temperature 97.1, respirations 20. GENERAL:  This was an alert male.  He was easily frustrated, dysarthric speech but intelligible.  He followed basic commands.  He needed some cuing to recall date of birth and current month. LUNGS:  Clear to auscultation. CARDIAC:  Rate controlled. ABDOMEN:  Soft, nontender.  Good bowel sounds.  Left amputation site well healed.  Leg was well shaped.  REHABILITATION HOSPITAL COURSE:  The  patient was admitted to inpatient rehab services with therapies initiated on a 3-hour daily basis consisting of physical therapy, occupational therapy, speech therapy, and rehabilitation nursing.  The following issues were addressed during the patient's rehabilitation stay.  Pertaining to Ms. Klecha, left frontal lobe embolic infarction remained stable, continued with Coumadin therapy as well as low-dose aspirin.  Chronic Coumadin was ongoing to be followed by Dr. Bethann Punches at the [facility].  There were no bleeding episodes.  Blood  pressures remained well controlled and monitored with no orthostatic changes.  Pain management with the use of a Lidoderm patch as well as Tylenol.  Noted history of diabetes mellitus with hemoglobin A1c of 8.  The patient remained on Lantus insulin. After discussing diabetic situation with patient's daughter, he did see an endocrinologist which was scheduled to be done upon his discharge from [facility].  His blood sugar still had some variables. It was discussed the possibility of scheduling his NovoLog insulin. Noted hospital course findings of mild compression fractures at T7 and T10 as well as chronic compression fractures T11, T12, and L1.  No current plans for vertebroplasty at this time and follow up as an outpatient.  It was advised to wear a soft lumbar corset as needed.  He continued with a stump shrinker for left below-knee amputation.  There was consideration of prosthesis at some point after discharge once improved from stroke.  The patient received weekly collaborative interdisciplinary team conferences to discuss estimated length of stay, family teaching, and any barriers to his discharge.  He had occasional incontinence of bowel and bladder with routine toileting.  He was maintained on a regular consistency diet.  Required minimum to moderate assist bathing, supervision upper body dressing, mod assist lower body dressing, minimum to moderate assist for stand pivot transfers and moderate assist toileting.  He was currently supervision for bed mobility, minimal assist to moderate assist for squat and stand pivot transfers, minimum to moderate assist for dynamic standing balance, gait was limited secondary to patient not wanting to perform with a rolling walker at times needing some encouragement.  His plan was to be discharged to home with 24-hour care.  Ongoing therapies would be dictated as per rehab services.  DISCHARGE MEDICATIONS: 1. Norvasc 10 mg  daily. 2. Aspirin 81 mg daily. 3. Lipitor 80 mg daily. 4. Cymbalta 30 mg daily. 5. Lantus insulin 30 units daily. 6. Lidoderm patch change every 24 hours. 7. Lopressor 75 mg b.i.d. 8. Protonix 40 mg daily. 9. MiraLax 17 g with 8 ounces of water, hold for loose stools. 10.Senokot tablets 2 twice daily.  Again hold for loose stools. 11.Demadex 10 mg twice daily. 12.Coumadin 2.5 mg daily, adjusted accordingly for an INR of 2.0 to     3.0.  DIET:  Diabetic diet.  SPECIAL INSTRUCTIONS:  A home health nurse to check INR on October 26, 2012, with results to Dr. Bethann Punches at [phone] [id number], fax [phone].  The patient would follow up with Dr. Claudette Laws November 09, 2012.  Dr. Delia Heady, call for appointment in 1 month. Also arrangements to be made by family to follow up with local endocrinologist to addresses his diabetes mellitus which the patient had seen in the past.     Aaron [name], P.A.   ______________________________ Erick Colace, M.D.    DA/MEDQ  D:  10/23/2012  T:  10/23/2012  Job:  [id number]  cc:   Pramod P. Pearlean Brownie, MD Bethann Punches, MD

## 2012-10-23 NOTE — Progress Notes (Signed)
Hypoglycemic Event  CBG: 48  Treatment: 15 GM carbohydrate snack  Symptoms: None  Follow-up CBG: Time:1645, 1700 CBG Result:62, 124    Possible Reasons for Event: Unknown  Comments/MD notified:Dan Anguilli PA    [name], Aaron Haviland D  Remember to initiate Hypoglycemia Order Set & complete

## 2012-10-23 NOTE — Progress Notes (Signed)
Occupational Therapy Discharge Summary  Patient Details  Name: Aaron [name] MRN: [id number] Date of Birth: 31-Jul-1924  Today's Date: 10/23/2012 Time: 4034-7425 and 1415-1500 Time Calculation (min): 45 min and 45 min  Patient has met 10 of 10 long term goals due to improved activity tolerance, improved balance, postural control and functional use of  RIGHT upper and RIGHT lower extremity.  Patient to discharge at [facility] Assist level.  Patient's care partner is independent to provide the necessary physical and cognitive assistance at discharge.  Patient to d/c home with 24/7 care from hired caregiver.  Reasons goals not met: N/A  Recommendation:  Patient will benefit from ongoing skilled OT services in home health setting to continue to advance functional skills in the area of BADL, iADL and Reduce care partner burden.  Equipment: No equipment provided Pt has necessary equipment PTA.  Reasons for discharge: treatment goals met and discharge from hospital  Patient/family agrees with progress made and goals achieved: Yes  OT Discharge Precautions/Restrictions  Precautions Precautions: Fall Precaution Comments: Lumbar corset to be worn OOB; LLE BKA, AFIB  Pain Pain Assessment Pain Assessment: No/denies pain ADL ADL Equipment Provided: Other (comment) (shoe button) Eating: Set up Where Assessed-Eating: Wheelchair Grooming: Supervision/safety Where Assessed-Grooming: Sitting at sink Upper Body Bathing: Minimal cueing;Minimal assistance Where Assessed-Upper Body Bathing: Sitting at sink Lower Body Bathing: Minimal cueing;Minimal assistance Where Assessed-Lower Body Bathing: Standing at sink;Sitting at sink Upper Body Dressing: Supervision/safety;Setup Where Assessed-Upper Body Dressing: Sitting at sink Lower Body Dressing: Minimal cueing;Minimal assistance Where Assessed-Lower Body Dressing: Sitting at sink;Standing at sink Toileting: Minimal assistance Where  Assessed-Toileting: Teacher, adult education: Curator Method: Surveyor, minerals: Engineer, technical sales: International aid/development worker Method: Engineer, technical sales: Emergency planning/management officer ADL Comments: Pt requires max encouragement to attempt pulling up/down pants in standing with toileting and LB dressing (He will let people help him even with tasks he can do). Vision/Perception  Vision - History Baseline Vision: Wears glasses all the time Patient Visual Report: No change from baseline Vision - Assessment Eye Alignment: Within Functional Limits  Cognition Overall Cognitive Status: Other (comment) (at baseline function; min assist 24/7 supervision at home) Orientation Level: Oriented to person;Oriented to place;Oriented to situation Sensation Sensation Light Touch: Appears Intact Proprioception: Appears Intact Extremity/Trunk Assessment RUE Assessment RUE Assessment: Exceptions to [facility] RUE AROM (degrees) Overall AROM Right Upper Extremity: Deficits (Shoulder & elbow & wrist ROM WFL, decreased fingers) LUE Assessment LUE Assessment: Within Functional Limits  See FIM for current functional status  [name] 10/23/2012, 4:08 PM

## 2012-10-24 LAB — GLUCOSE, CAPILLARY: Glucose-Capillary: 155 mg/dL — ABNORMAL HIGH (ref 70–99)

## 2012-10-24 LAB — PROTIME-INR: INR: 2.37 — ABNORMAL HIGH (ref 0.00–1.49)

## 2012-10-24 MED ORDER — INSULIN GLARGINE 100 UNIT/ML ~~LOC~~ SOLN: 30.0000 [IU] | Freq: Every day | SUBCUTANEOUS | Status: DC

## 2012-10-24 MED ORDER — LIDOCAINE 5 % EX PTCH: 2.0000 | MEDICATED_PATCH | CUTANEOUS | Status: DC

## 2012-10-24 MED ORDER — WARFARIN SODIUM 2.5 MG PO TABS: 2.5000 mg | ORAL_TABLET | Freq: Every day | ORAL | Status: DC

## 2012-10-24 NOTE — Progress Notes (Signed)
Patient ID: Aaron [name], male   DOB: Mar 29, 1924, 78 y.o.   MRN: [id number]   Subjective/Complaints: Discussed poor prognosis for prosthetic use. Pt will f/u with endocrine next week.  At home overall goal is ~200 per daughter   Feeling well. Making progress with therapies. No new problems yesterday or today thus far.  A 12 point review of systems has been performed and if not noted above is otherwise negative.   Objective: Vital Signs: Blood pressure 138/75, pulse 65, temperature 97.9 F (36.6 C), temperature source Oral, resp. rate 17, height 5\' 8"  (1.727 m), weight 69.6 kg (153 lb 7 oz), SpO2 98.00%. No results found. Results for orders placed during the hospital encounter of 10/03/12 (from the past 72 hour(s))  GLUCOSE, CAPILLARY     Status: Abnormal   Collection Time   10/21/12 11:28 AM      Component Value Range Comment   Glucose-Capillary 357 (*) 70 - 99 mg/dL   GLUCOSE, CAPILLARY     Status: Abnormal   Collection Time   10/21/12  4:22 PM      Component Value Range Comment   Glucose-Capillary 60 (*) 70 - 99 mg/dL   GLUCOSE, CAPILLARY     Status: Abnormal   Collection Time   10/21/12  5:54 PM      Component Value Range Comment   Glucose-Capillary 166 (*) 70 - 99 mg/dL   GLUCOSE, CAPILLARY     Status: Abnormal   Collection Time   10/21/12  8:59 PM      Component Value Range Comment   Glucose-Capillary 222 (*) 70 - 99 mg/dL    Comment 1 Notify RN     GLUCOSE, CAPILLARY     Status: Abnormal   Collection Time   10/21/12 11:58 PM      Component Value Range Comment   Glucose-Capillary 320 (*) 70 - 99 mg/dL    Comment 1 Notify RN     GLUCOSE, CAPILLARY     Status: Abnormal   Collection Time   10/22/12  4:26 AM      Component Value Range Comment   Glucose-Capillary 269 (*) 70 - 99 mg/dL    Comment 1 Notify RN     PROTIME-INR     Status: Abnormal   Collection Time   10/22/12  7:30 AM      Component Value Range Comment   Prothrombin Time 24.0 (*) 11.6 - 15.2 seconds    INR  2.26 (*) 0.00 - 1.49   GLUCOSE, CAPILLARY     Status: Abnormal   Collection Time   10/22/12  7:30 AM      Component Value Range Comment   Glucose-Capillary 241 (*) 70 - 99 mg/dL   BASIC METABOLIC PANEL     Status: Abnormal   Collection Time   10/22/12  9:36 AM      Component Value Range Comment   Sodium 135  135 - 145 mEq/L    Potassium 4.1  3.5 - 5.1 mEq/L    Chloride 98  96 - 112 mEq/L    CO2 28  19 - 32 mEq/L    Glucose, Bld 355 (*) 70 - 99 mg/dL    BUN 24 (*) 6 - 23 mg/dL    Creatinine, Ser 4.09  0.50 - 1.35 mg/dL    Calcium 8.7  8.4 - 81.1 mg/dL    GFR calc non Af Amer 56 (*) >90 mL/min    GFR calc Af Amer 65 (*) >90 mL/min  GLUCOSE, CAPILLARY     Status: Abnormal   Collection Time   10/22/12 11:39 AM      Component Value Range Comment   Glucose-Capillary 183 (*) 70 - 99 mg/dL    Comment 1 Notify RN     GLUCOSE, CAPILLARY     Status: Abnormal   Collection Time   10/22/12  4:14 PM      Component Value Range Comment   Glucose-Capillary 117 (*) 70 - 99 mg/dL   GLUCOSE, CAPILLARY     Status: Abnormal   Collection Time   10/22/12  8:26 PM      Component Value Range Comment   Glucose-Capillary 196 (*) 70 - 99 mg/dL   GLUCOSE, CAPILLARY     Status: Abnormal   Collection Time   10/23/12 12:11 AM      Component Value Range Comment   Glucose-Capillary 213 (*) 70 - 99 mg/dL    Comment 1 Notify RN     GLUCOSE, CAPILLARY     Status: Abnormal   Collection Time   10/23/12  4:07 AM      Component Value Range Comment   Glucose-Capillary 304 (*) 70 - 99 mg/dL    Comment 1 Notify RN     GLUCOSE, CAPILLARY     Status: Abnormal   Collection Time   10/23/12  7:28 AM      Component Value Range Comment   Glucose-Capillary 217 (*) 70 - 99 mg/dL    Comment 1 Notify RN     GLUCOSE, CAPILLARY     Status: Abnormal   Collection Time   10/23/12 11:24 AM      Component Value Range Comment   Glucose-Capillary 155 (*) 70 - 99 mg/dL    Comment 1 Notify RN     GLUCOSE, CAPILLARY     Status:  Abnormal   Collection Time   10/23/12  4:24 PM      Component Value Range Comment   Glucose-Capillary 48 (*) 70 - 99 mg/dL   GLUCOSE, CAPILLARY     Status: Abnormal   Collection Time   10/23/12  4:53 PM      Component Value Range Comment   Glucose-Capillary 62 (*) 70 - 99 mg/dL   GLUCOSE, CAPILLARY     Status: Abnormal   Collection Time   10/23/12  5:15 PM      Component Value Range Comment   Glucose-Capillary 123 (*) 70 - 99 mg/dL   GLUCOSE, CAPILLARY     Status: Abnormal   Collection Time   10/23/12  8:50 PM      Component Value Range Comment   Glucose-Capillary 192 (*) 70 - 99 mg/dL   GLUCOSE, CAPILLARY     Status: Abnormal   Collection Time   10/23/12 11:54 PM      Component Value Range Comment   Glucose-Capillary 206 (*) 70 - 99 mg/dL    Comment 1 Notify RN     GLUCOSE, CAPILLARY     Status: Abnormal   Collection Time   10/24/12  4:48 AM      Component Value Range Comment   Glucose-Capillary 156 (*) 70 - 99 mg/dL    Comment 1 Notify RN     PROTIME-INR     Status: Abnormal   Collection Time   10/24/12  6:35 AM      Component Value Range Comment   Prothrombin Time 24.8 (*) 11.6 - 15.2 seconds    INR 2.37 (*) 0.00 - 1.49  GLUCOSE, CAPILLARY     Status: Abnormal   Collection Time   10/24/12  7:40 AM      Component Value Range Comment   Glucose-Capillary 155 (*) 70 - 99 mg/dL    Comment 1 Notify RN        HEENT: normal Cardio: irregular Resp: CTA B/L GI: BS positive Extremity:  Cyanosis Skin:   Breakdown R great toe tip ulcer, scab has been removed fresh granulation tissue without active bleeding Neuro: Lethargic, Abnormal Motor, R hand strength improved 2- grip, 3- WE R side 3-/5 in UE and 3/5 in LE and Dysarthric, aphasic Musc/Skel:  Other L BKA, R hand Dupuytrens contracture 5th digit   Assessment/Plan: 1. Functional deficits secondary to L frontal infarct should be ready for D/C PMR f/u in 4wk, Vascular surgery f/u for L BKA, Endo f/u for DMFIM: FIM -  Bathing Bathing Steps Patient Completed: Chest;Right Arm;Abdomen;Front perineal area;Buttocks;Right upper leg;Left upper leg;Right lower leg (including foot) Bathing: 4: Min-Patient completes 8-9 47f 10 parts or 75+ percent  FIM - Upper Body Dressing/Undressing Upper body dressing/undressing steps patient completed: Thread/unthread right sleeve of pullover shirt/dresss;Thread/unthread left sleeve of pullover shirt/dress;Put head through opening of pull over shirt/dress;Pull shirt over trunk Upper body dressing/undressing: 5: Set-up assist to: Obtain clothing/put away FIM - Lower Body Dressing/Undressing Lower body dressing/undressing steps patient completed: Thread/unthread right underwear leg;Thread/unthread left underwear leg;Pull underwear up/down;Thread/unthread right pants leg;Thread/unthread left pants leg;Pull pants up/down;Don/Doff right sock;Don/Doff right shoe;Fasten/unfasten right shoe Lower body dressing/undressing: 4: Steadying Assist  FIM - Toileting Toileting steps completed by patient: Performs perineal hygiene Toileting Assistive Devices: Grab bar or rail for support Toileting: 0: Activity did not occur  FIM - Diplomatic Services operational officer Devices: Grab bars Toilet Transfers: 4-To toilet/BSC: Min A (steadying Pt. > 75%);4-From toilet/BSC: Min A (steadying Pt. > 75%)  FIM - Bed/Chair Transfer Bed/Chair Transfer Assistive Devices: Bed rails;Arm rests;HOB elevated Bed/Chair Transfer: 5: Supine > Sit: Supervision (verbal cues/safety issues);5: Sit > Supine: Supervision (verbal cues/safety issues);4: Bed > Chair or W/C: Min A (steadying Pt. > 75%);4: Chair or W/C > Bed: Min A (steadying Pt. > 75%)  FIM - Locomotion: Wheelchair Distance: 200 Locomotion: Wheelchair: 6: Travels 150 ft or more, turns around, maneuvers to table, bed or toilet, negotiates 3% grade: maneuvers on rugs and over door sills independently FIM - Locomotion: Ambulation Locomotion: Ambulation  Assistive Devices: Designer, industrial/product Ambulation/Gait Assistance: 3: Mod assist Locomotion: Ambulation: 0: Activity did not occur  Comprehension Comprehension Mode: Auditory Comprehension: 5-Understands complex 90% of the time/Cues < 10% of the time  Expression Expression Mode: Verbal Expression: 5-Expresses complex 90% of the time/cues < 10% of the time  Social Interaction Social Interaction: 5-Interacts appropriately 90% of the time - Needs monitoring or encouragement for participation or interaction.  Problem Solving Problem Solving Mode: Asleep Problem Solving: 5-Solves basic 90% of the time/requires cueing < 10% of the time  Memory Memory: 4-Recognizes or recalls 75 - 89% of the time/requires cueing 10 - 24% of the time  Medical Problem List and Plan:  1. Left frontal lobe embolic infarct/recent left below-knee amputation 07/22/2012 , Splinting for L hand OT.  Discussed with patient poor candidate for functional prosthetic use, pt seemed surprised 2. DVT Prophylaxis/Anticoagulation: Chronic Coumadin therapy. Monitor for any bleeding episodes  3. Pain Management: Tylenol as needed. Monitor with increased mobility  4. Neuropsych: This patient is capable of making decisions on his/her own behalf.Home med was cymbalta will resume  5. Hypertension/atrial  fibrillation. Cardiac rate controlled. Continue Norvasc 10 mg daily, Lopressor 75 mg twice a day and Demadex 10 mg twice a day. Monitor with increased activity  6. Diabetes mellitus.Controlled  Hemoglobin A1c of 8.0.  Lantus insulin 27 units daily--increased to 30 qday.11/2. Likely will need further titration of regimen. Check blood sugars a.c. and at bedtime see discussion above. Cont current dose.  SSI, brittle, Ask Endo to eval 7. Mild compression fractures T7 and T10 as well his chronic compression fractures with cemented vertebral augmentation T11, T12 and L1. No plans for a further vertebral plasty at this time and followup as  outpatient unable to do this currently since he would need to be off warfarin -advise the use of a soft lumbar-thoracic corset  -lidoderm patches  8. Left BKA- well shaped. Continue stump shrinker.  -prosthesis at some point after dc once improved from a stroke and back standpoint. 9.  Toe lesion will order neosporin ointment and drsg changes, no further bleeding  LOS (Days) 21 A FACE TO FACE EVALUATION WAS PERFORMED  Yaritsa Savarino E 10/24/2012, 9:41 AM

## 2012-10-24 NOTE — Progress Notes (Signed)
Patient discharged to home with daughters at 52. Patient and family provided discharge instructions by Deatra Ina, PA, with understanding verbalized. Patient able to correctly identify insulin pen, dial and prime, screw on needle, and administer. However, patient required multiple cues from RN to do this. Hedy Camara

## 2012-10-24 NOTE — Progress Notes (Signed)
Social Work Discharge Note Discharge Note  The overall goal for the admission was met for:   Discharge location: Yes-HOME WITH 24 HR [employer]  Length of Stay: Yes-21 DAYS  Discharge activity level: Yes-MIN ASSIST LEVEL  Home/community participation: Yes  Services provided included: MD, RD, PT, OT, SLP, RN, TR, Pharmacy and SW  Financial Services: Medicare and Private Insurance: BCBS  Follow-up services arranged: Home Health: [facility]-PT,OT,RN and Patient/Family request agency HH: [facility] PRIOR TO ADMISSION, DME: NONE NEEDED  Comments (or additional information):  Patient/Family verbalized understanding of follow-up arrangements: Yes  Individual responsible for coordination of the follow-up plan: JANICE-DAUGHTER  Confirmed correct DME delivered: Lucy Chris 10/24/2012    Lucy Chris

## 2012-11-06 ENCOUNTER — Inpatient Hospital Stay: Payer: Self-pay | Admitting: Internal Medicine

## 2012-11-06 LAB — CBC
HCT: 33.9 % — ABNORMAL LOW (ref 40.0–52.0)
HGB: 10.8 g/dL — ABNORMAL LOW (ref 13.0–18.0)
MCHC: 32 g/dL (ref 32.0–36.0)
Platelet: 296 10*3/uL (ref 150–440)
RDW: 18.4 % — ABNORMAL HIGH (ref 11.5–14.5)
WBC: 12.8 10*3/uL — ABNORMAL HIGH (ref 3.8–10.6)

## 2012-11-06 LAB — BASIC METABOLIC PANEL
Anion Gap: 9 (ref 7–16)
BUN: 39 mg/dL — ABNORMAL HIGH (ref 7–18)
Calcium, Total: 9 mg/dL (ref 8.5–10.1)
Chloride: 103 mmol/L (ref 98–107)
Co2: 26 mmol/L (ref 21–32)
Creatinine: 1.56 mg/dL — ABNORMAL HIGH (ref 0.60–1.30)
EGFR (African American): 46 — ABNORMAL LOW
EGFR (Non-African Amer.): 40 — ABNORMAL LOW
Glucose: 126 mg/dL — ABNORMAL HIGH (ref 65–99)
Osmolality: 287 (ref 275–301)
Potassium: 4.3 mmol/L (ref 3.5–5.1)
Sodium: 138 mmol/L (ref 136–145)

## 2012-11-06 LAB — CK TOTAL AND CKMB (NOT AT ARMC)
CK, Total: 51 U/L (ref 35–232)
CK-MB: 3.5 ng/mL (ref 0.5–3.6)

## 2012-11-06 LAB — TROPONIN I: Troponin-I: 1.1 ng/mL — ABNORMAL HIGH

## 2012-11-06 LAB — PROTIME-INR: Prothrombin Time: 20.3 secs — ABNORMAL HIGH (ref 11.5–14.7)

## 2012-11-07 LAB — CBC WITH DIFFERENTIAL/PLATELET
Basophil #: 0.2 10*3/uL — ABNORMAL HIGH (ref 0.0–0.1)
Basophil %: 1.3 %
Eosinophil #: 0.3 10*3/uL (ref 0.0–0.7)
HCT: 31.4 % — ABNORMAL LOW (ref 40.0–52.0)
HGB: 10.2 g/dL — ABNORMAL LOW (ref 13.0–18.0)
Lymphocyte %: 10.7 %
MCH: 28.3 pg (ref 26.0–34.0)
MCHC: 32.4 g/dL (ref 32.0–36.0)
MCV: 87 fL (ref 80–100)
Monocyte #: 1 x10 3/mm (ref 0.2–1.0)
Neutrophil #: 9.5 10*3/uL — ABNORMAL HIGH (ref 1.4–6.5)
Platelet: 266 10*3/uL (ref 150–440)
RDW: 18.5 % — ABNORMAL HIGH (ref 11.5–14.5)

## 2012-11-07 LAB — BASIC METABOLIC PANEL
Anion Gap: 9 (ref 7–16)
BUN: 39 mg/dL — ABNORMAL HIGH (ref 7–18)
Chloride: 104 mmol/L (ref 98–107)
Co2: 27 mmol/L (ref 21–32)
EGFR (African American): 46 — ABNORMAL LOW
Osmolality: 292 (ref 275–301)
Potassium: 3.9 mmol/L (ref 3.5–5.1)

## 2012-11-07 LAB — CK TOTAL AND CKMB (NOT AT ARMC)
CK, Total: 37 U/L (ref 35–232)
CK-MB: 2.5 ng/mL (ref 0.5–3.6)

## 2012-11-07 LAB — TROPONIN I: Troponin-I: 1.69 ng/mL — ABNORMAL HIGH

## 2012-11-08 LAB — CBC WITH DIFFERENTIAL/PLATELET
Basophil #: 0 10*3/uL (ref 0.0–0.1)
Basophil %: 0.5 %
Eosinophil #: 0.6 10*3/uL (ref 0.0–0.7)
HCT: 32.1 % — ABNORMAL LOW (ref 40.0–52.0)
HGB: 11 g/dL — ABNORMAL LOW (ref 13.0–18.0)
Lymphocyte %: 12.5 %
Monocyte #: 0.8 x10 3/mm (ref 0.2–1.0)
Monocyte %: 8.8 %
Neutrophil %: 71.6 %
Platelet: 261 10*3/uL (ref 150–440)
RBC: 3.67 10*6/uL — ABNORMAL LOW (ref 4.40–5.90)
WBC: 9.7 10*3/uL (ref 3.8–10.6)

## 2012-11-08 LAB — BASIC METABOLIC PANEL
Anion Gap: 6 — ABNORMAL LOW (ref 7–16)
BUN: 42 mg/dL — ABNORMAL HIGH (ref 7–18)
Chloride: 103 mmol/L (ref 98–107)
Co2: 27 mmol/L (ref 21–32)
Creatinine: 1.58 mg/dL — ABNORMAL HIGH (ref 0.60–1.30)
EGFR (African American): 45 — ABNORMAL LOW
Potassium: 4.4 mmol/L (ref 3.5–5.1)

## 2012-11-09 ENCOUNTER — Inpatient Hospital Stay: Payer: Medicare Other | Admitting: Physical Medicine & Rehabilitation

## 2013-02-10 IMAGING — CR DG CHEST 2V
1 series · 2 of 2 positions shown · non-contrast
Comparison: none

REASON FOR EXAM: hypoxia, chf
COMMENTS:

[Series 1: x chest ap · 0.14mm/px · 2 of 2 slices shown]
[im 1/2]
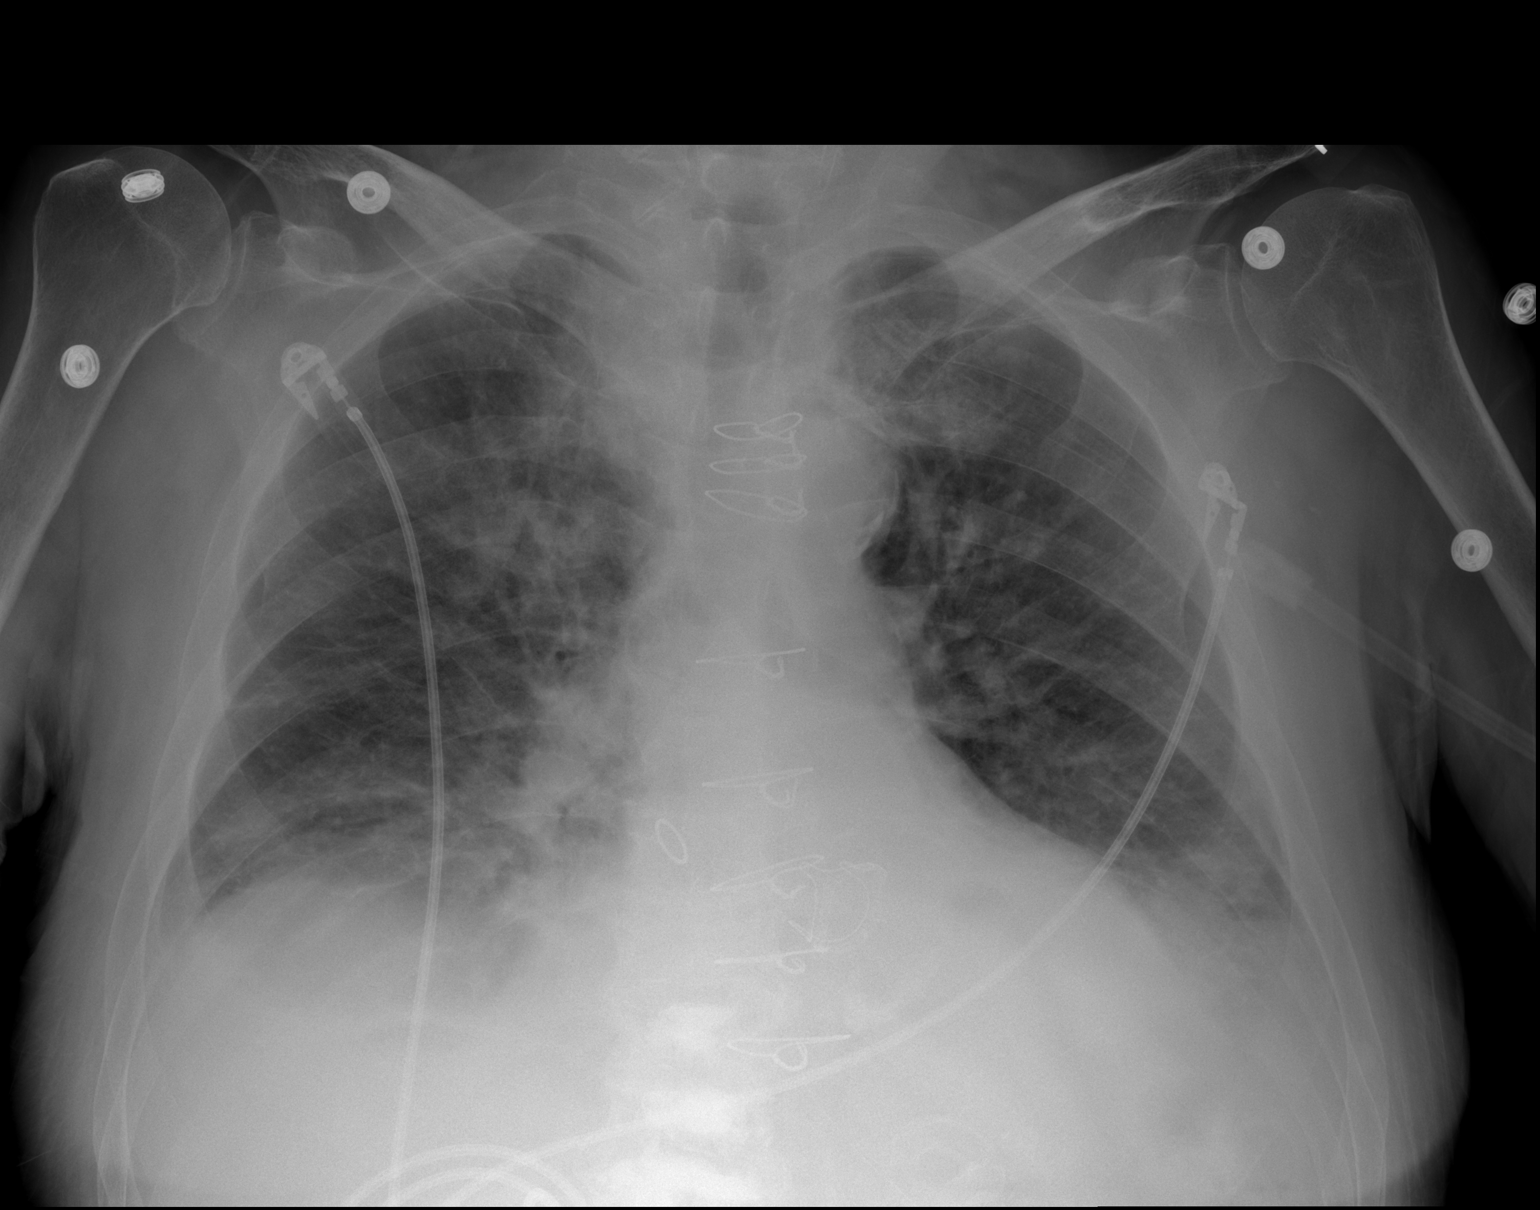
[im 2/2]
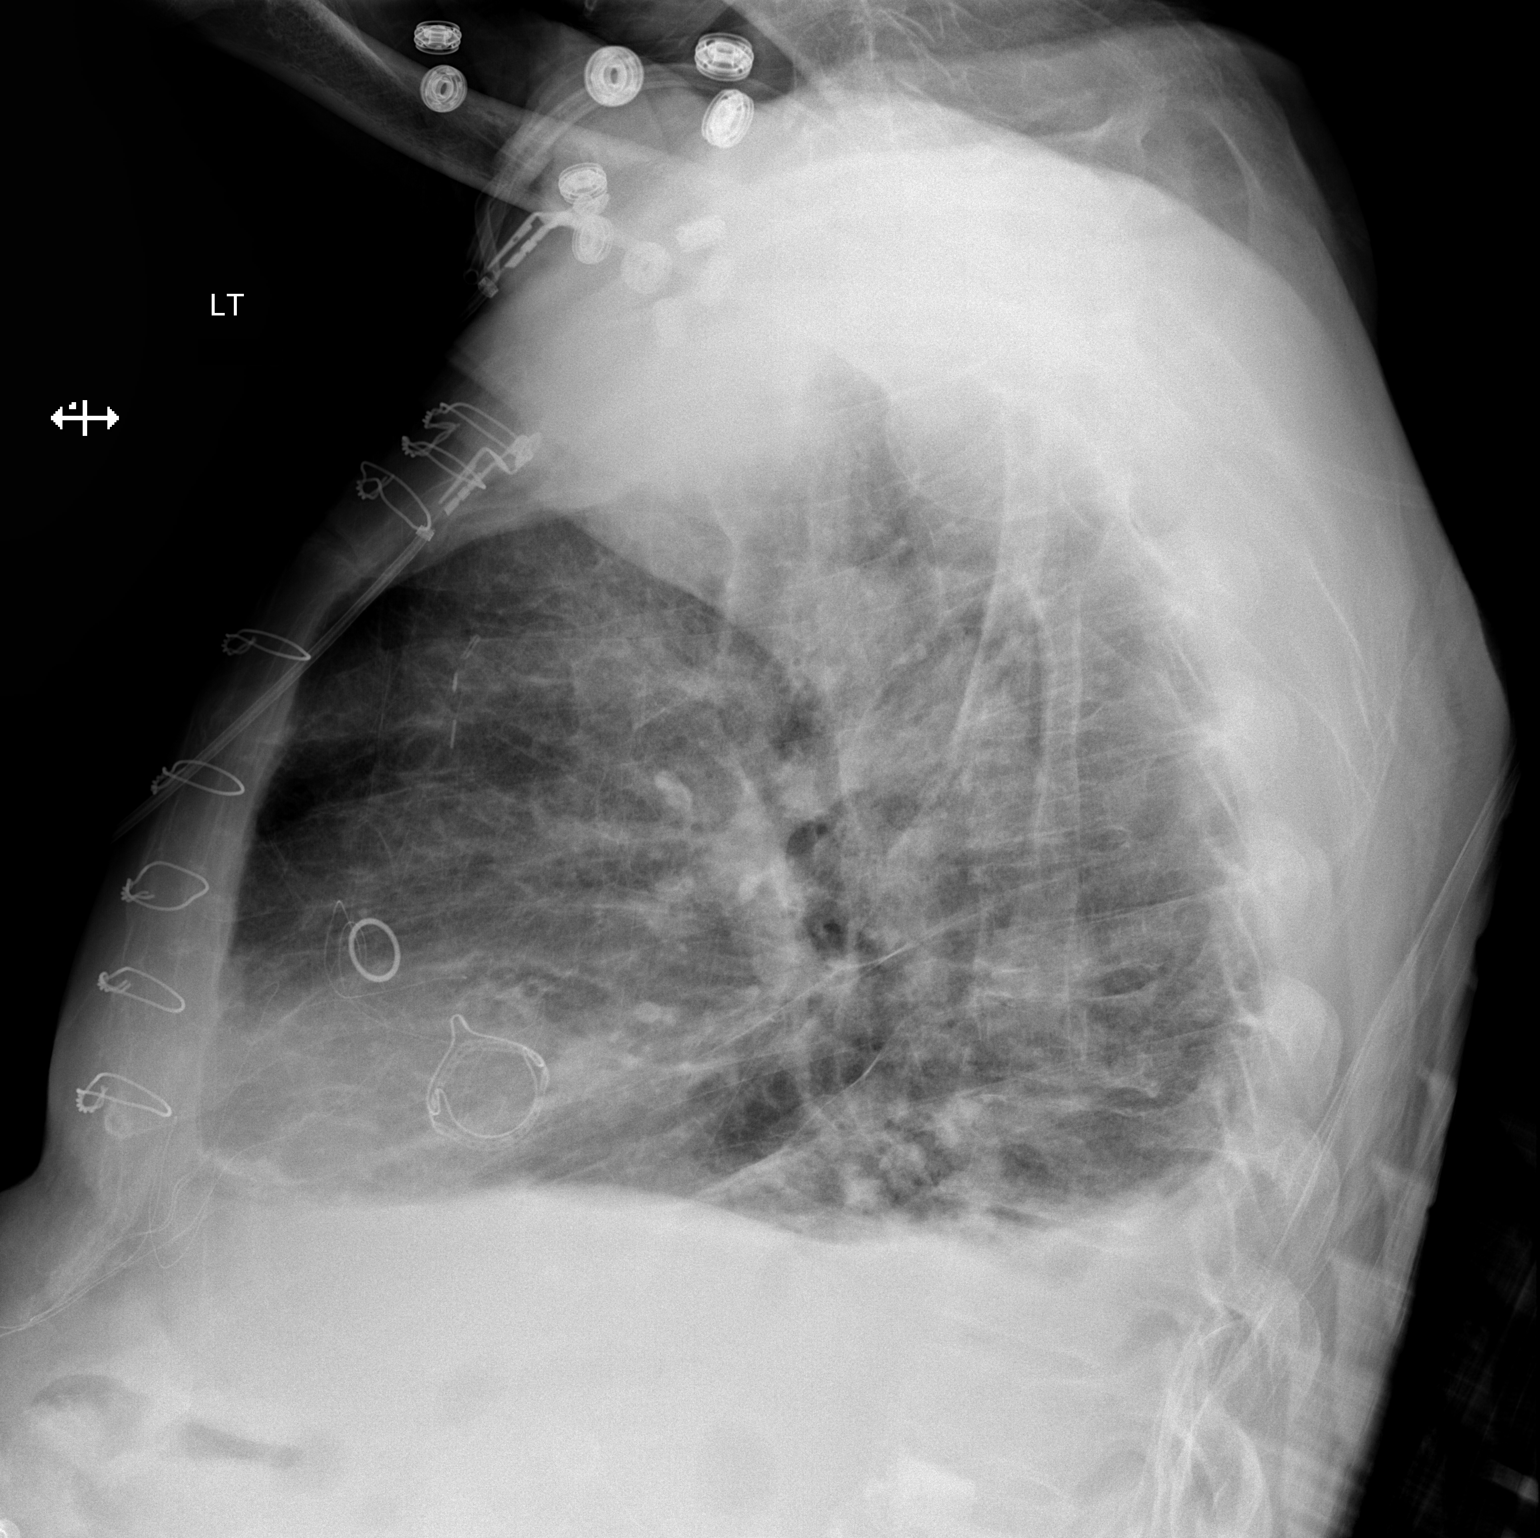

[2 of 2 positions shown; findings below may reference images not displayed]

PROCEDURE:     DXR - DXR CHEST PA (OR AP) AND LATERAL  - November 07, 2012  [DATE]

RESULT:     Comparison is made to the study 06 November, 2012.

Cardiac silhouette appears normal. Sternotomy wires are present. Cardiac
monitoring electrodes are present. There is increased density bilaterally
with central hilar prominence and findings of patchy areas of basilar
infiltrate versus atelectasis with some interstitial infiltrate of an
edematous or nonedematous etiology. Correlate clinically. Trace effusions
appear present.
IMPRESSION: No cardiomegaly. Small bilateral effusions. Interstitial
infiltrate as discussed above bilaterally.

[REDACTED]

## 2013-02-22 ENCOUNTER — Emergency Department (HOSPITAL_COMMUNITY): Payer: Medicare Other

## 2013-02-22 ENCOUNTER — Inpatient Hospital Stay (HOSPITAL_COMMUNITY)
Admission: EM | Admit: 2013-02-22 | Discharge: 2013-03-02 | DRG: 469 | Disposition: A | Discharge status: Other Acute Inpatient Hospital | Payer: Medicare Other | Attending: Internal Medicine | Admitting: Internal Medicine

## 2013-02-22 ENCOUNTER — Encounter (HOSPITAL_COMMUNITY): Payer: Self-pay | Admitting: Emergency Medicine

## 2013-02-22 DIAGNOSIS — I10 Essential (primary) hypertension: Secondary | ICD-10-CM

## 2013-02-22 DIAGNOSIS — Z7901 Long term (current) use of anticoagulants: Secondary | ICD-10-CM

## 2013-02-22 DIAGNOSIS — Z7982 Long term (current) use of aspirin: Secondary | ICD-10-CM

## 2013-02-22 DIAGNOSIS — N183 Chronic kidney disease, stage 3 unspecified: Secondary | ICD-10-CM | POA: Diagnosis present

## 2013-02-22 DIAGNOSIS — W19XXXA Unspecified fall, initial encounter: Secondary | ICD-10-CM | POA: Diagnosis present

## 2013-02-22 DIAGNOSIS — E860 Dehydration: Secondary | ICD-10-CM | POA: Diagnosis not present

## 2013-02-22 DIAGNOSIS — S72002D Fracture of unspecified part of neck of left femur, subsequent encounter for closed fracture with routine healing: Secondary | ICD-10-CM

## 2013-02-22 DIAGNOSIS — I639 Cerebral infarction, unspecified: Secondary | ICD-10-CM

## 2013-02-22 DIAGNOSIS — Y92009 Unspecified place in unspecified non-institutional (private) residence as the place of occurrence of the external cause: Secondary | ICD-10-CM

## 2013-02-22 DIAGNOSIS — Z66 Do not resuscitate: Secondary | ICD-10-CM | POA: Diagnosis present

## 2013-02-22 DIAGNOSIS — N170 Acute kidney failure with tubular necrosis: Secondary | ICD-10-CM | POA: Diagnosis not present

## 2013-02-22 DIAGNOSIS — I739 Peripheral vascular disease, unspecified: Secondary | ICD-10-CM | POA: Diagnosis present

## 2013-02-22 DIAGNOSIS — E119 Type 2 diabetes mellitus without complications: Secondary | ICD-10-CM

## 2013-02-22 DIAGNOSIS — E875 Hyperkalemia: Secondary | ICD-10-CM | POA: Diagnosis not present

## 2013-02-22 DIAGNOSIS — I129 Hypertensive chronic kidney disease with stage 1 through stage 4 chronic kidney disease, or unspecified chronic kidney disease: Secondary | ICD-10-CM | POA: Diagnosis present

## 2013-02-22 DIAGNOSIS — I4891 Unspecified atrial fibrillation: Secondary | ICD-10-CM

## 2013-02-22 DIAGNOSIS — S72002A Fracture of unspecified part of neck of left femur, initial encounter for closed fracture: Secondary | ICD-10-CM

## 2013-02-22 DIAGNOSIS — R748 Abnormal levels of other serum enzymes: Secondary | ICD-10-CM

## 2013-02-22 DIAGNOSIS — S72109A Unspecified trochanteric fracture of unspecified femur, initial encounter for closed fracture: Secondary | ICD-10-CM | POA: Diagnosis present

## 2013-02-22 DIAGNOSIS — K7200 Acute and subacute hepatic failure without coma: Secondary | ICD-10-CM | POA: Diagnosis not present

## 2013-02-22 DIAGNOSIS — Z951 Presence of aortocoronary bypass graft: Secondary | ICD-10-CM

## 2013-02-22 DIAGNOSIS — Z794 Long term (current) use of insulin: Secondary | ICD-10-CM

## 2013-02-22 DIAGNOSIS — S72009A Fracture of unspecified part of neck of unspecified femur, initial encounter for closed fracture: Principal | ICD-10-CM | POA: Diagnosis present

## 2013-02-22 DIAGNOSIS — Z87891 Personal history of nicotine dependence: Secondary | ICD-10-CM

## 2013-02-22 DIAGNOSIS — I5032 Chronic diastolic (congestive) heart failure: Secondary | ICD-10-CM | POA: Diagnosis present

## 2013-02-22 DIAGNOSIS — Z79899 Other long term (current) drug therapy: Secondary | ICD-10-CM

## 2013-02-22 DIAGNOSIS — S88119A Complete traumatic amputation at level between knee and ankle, unspecified lower leg, initial encounter: Secondary | ICD-10-CM

## 2013-02-22 DIAGNOSIS — I509 Heart failure, unspecified: Secondary | ICD-10-CM | POA: Diagnosis present

## 2013-02-22 DIAGNOSIS — J189 Pneumonia, unspecified organism: Secondary | ICD-10-CM

## 2013-02-22 DIAGNOSIS — D62 Acute posthemorrhagic anemia: Secondary | ICD-10-CM | POA: Diagnosis not present

## 2013-02-22 DIAGNOSIS — Z952 Presence of prosthetic heart valve: Secondary | ICD-10-CM

## 2013-02-22 LAB — COMPREHENSIVE METABOLIC PANEL
ALT: 20 U/L (ref 0–53)
AST: 24 U/L (ref 0–37)
Albumin: 3.6 g/dL (ref 3.5–5.2)
Alkaline Phosphatase: 66 U/L (ref 39–117)
Potassium: 4.4 mEq/L (ref 3.5–5.1)
Sodium: 138 mEq/L (ref 135–145)
Total Protein: 7.4 g/dL (ref 6.0–8.3)

## 2013-02-22 LAB — PROTIME-INR: Prothrombin Time: 23.7 seconds — ABNORMAL HIGH (ref 11.6–15.2)

## 2013-02-22 LAB — CBC WITH DIFFERENTIAL/PLATELET
Basophils Absolute: 0.1 10*3/uL (ref 0.0–0.1)
HCT: 36 % — ABNORMAL LOW (ref 39.0–52.0)
Lymphocytes Relative: 4 % — ABNORMAL LOW (ref 12–46)
Monocytes Absolute: 0.9 10*3/uL (ref 0.1–1.0)
Neutro Abs: 17.8 10*3/uL — ABNORMAL HIGH (ref 1.7–7.7)
Platelets: 260 10*3/uL (ref 150–400)
RDW: 16.5 % — ABNORMAL HIGH (ref 11.5–15.5)
WBC: 19.7 10*3/uL — ABNORMAL HIGH (ref 4.0–10.5)

## 2013-02-22 LAB — GLUCOSE, CAPILLARY: Glucose-Capillary: 102 mg/dL — ABNORMAL HIGH (ref 70–99)

## 2013-02-22 MED ORDER — MORPHINE SULFATE 4 MG/ML IJ SOLN
4.0000 mg | Freq: Once | INTRAMUSCULAR | Status: AC
  Administered 2013-02-22: 4 mg via INTRAVENOUS
  Filled 2013-02-22: qty 1

## 2013-02-22 MED ORDER — CLINDAMYCIN PHOSPHATE 900 MG/50ML IV SOLN
900.0000 mg | INTRAVENOUS | Status: AC
  Filled 2013-02-22: qty 50

## 2013-02-22 MED ORDER — ONDANSETRON HCL 4 MG/2ML IJ SOLN
4.0000 mg | Freq: Once | INTRAMUSCULAR | Status: AC
  Administered 2013-02-22: 4 mg via INTRAVENOUS
  Filled 2013-02-22: qty 2

## 2013-02-22 MED ORDER — CHLORHEXIDINE GLUCONATE 4 % EX LIQD
60.0000 mL | Freq: Once | CUTANEOUS | Status: DC
  Filled 2013-02-22 (×3): qty 60

## 2013-02-22 NOTE — ED Notes (Signed)
Pt arrived by [facility] from home. Pt has a left BKA and was in the bathroom and fell on left hip. Pt pelvic is stable but has some crepitus to left side of hip. KED in place upside down for to keep hip stable. Pt received of Fentanyl and 2mg  Zofran.  BP-156/71 HR-67 Resp-14 CBG-85 (Pt daughter/POA stated that blood sugar drops easily) O2sat 93-96%ra 2L o2 applied o2sat increased to 97-98%

## 2013-02-22 NOTE — Consult Note (Signed)
Reason for Consult:Left hip pain, fracture Referring Physician: Rancour, Aaron [name] is an 79 y.o. male.  HPI: 79 year old male who fell while ambulating with walker today on L LE that resulted in L hip pain. History of BKA in 11/13 for PVD. Prior to today's accident was ambulatory with walker and prosthesis, also uses a wheelchair. Lives at home with 24 hr care. Complains of L hip pain, worse with movement, better with rest. No other orthopedic complaints.   Past Medical History  Diagnosis Date  . Hypertension   . Diabetes mellitus   . CHF (congestive heart failure)   . Coronary artery disease   . Atrial fibrillation   . PVD (peripheral vascular disease)   . S/P CABG x 1 2008  . S/P aortic valve replacement with porcine valve 2008    Past Surgical History  Procedure Laterality Date  . Below knee leg amputation  07/2012  . Cataract extraction w/ intraocular lens implant    . Abdominal surgery    . Hernia repair      x5  . Back surgery      vertebroplasty  . Coronary artery bypass graft  2008    No family history on file.  Social History:  reports that he has quit smoking. He uses smokeless tobacco. He reports that he does not drink alcohol or use illicit drugs.  Allergies:  Allergies  Allergen Reactions  . Metformin And Related Hives  . Penicillins Hives and Swelling    Medications: I have reviewed the patient's current medications.  Results for orders placed during the hospital encounter of 02/22/13 (from the past 48 hour(s))  GLUCOSE, CAPILLARY     Status: Abnormal   Collection Time    02/22/13  8:45 PM      Result Value Range   Glucose-Capillary 102 (*) 70 - 99 mg/dL   Comment 1 Notify RN     Comment 2 Documented in Chart    CBC WITH DIFFERENTIAL     Status: Abnormal   Collection Time    02/22/13 10:03 PM      Result Value Range   WBC 19.7 (*) 4.0 - 10.5 K/uL   RBC 4.51  4.22 - 5.81 MIL/uL   Hemoglobin 11.7 (*) 13.0 - 17.0 g/dL   HCT 82.9 (*) 56.2 -  52.0 %   MCV 79.8  78.0 - 100.0 fL   MCH 25.9 (*) 26.0 - 34.0 pg   MCHC 32.5  30.0 - 36.0 g/dL   RDW 13.0 (*) 86.5 - 78.4 %   Platelets 260  150 - 400 K/uL   Neutrophils Relative 90 (*) 43 - 77 %   Neutro Abs 17.8 (*) 1.7 - 7.7 K/uL   Lymphocytes Relative 4 (*) 12 - 46 %   Lymphs Abs 0.8  0.7 - 4.0 K/uL   Monocytes Relative 5  3 - 12 %   Monocytes Absolute 0.9  0.1 - 1.0 K/uL   Eosinophils Relative 0  0 - 5 %   Eosinophils Absolute 0.1  0.0 - 0.7 K/uL   Basophils Relative 0  0 - 1 %   Basophils Absolute 0.1  0.0 - 0.1 K/uL    Dg Chest 1 View  02/22/2013  *RADIOLOGY REPORT*  Clinical Data: Status post fall; concern for chest injury.  CHEST - 1 VIEW  Comparison: Chest radiograph performed 10/02/2012, and CT of the thoracic spine performed 10/02/2012  Findings: The lungs are well-aerated.  Mild vascular congestion is  noted, without significant pulmonary edema.  There is no evidence of focal opacification, pleural effusion or pneumothorax.  The cardiomediastinal silhouette is borderline normal in size.  An aortic valve replacement is noted.  The patient is status post median sternotomy, with evidence of prior CABG.  No acute osseous abnormalities are seen.  IMPRESSION: Mild vascular congestion noted; lungs remain grossly clear.  No displaced rib fractures seen.   Original Report Authenticated By: Tonia Ghent, M.D.    Dg Hip Complete Left  02/22/2013  *RADIOLOGY REPORT*  Clinical Data: Status post fall; left hip pain.  LEFT HIP - COMPLETE 2+ VIEW  Comparison: None.  Findings: There appears to be a mildly displaced basicervical fracture through the left femoral neck; the fracture line is difficult to fully characterize, but there is abnormal alignment of the proximal left femur.  The right hip joint is grossly unremarkable in appearance.  Mild degenerative change is noted at the lower lumbar spine.  The sacroiliac joints are grossly unremarkable in appearance.  Diffuse vascular calcifications are  seen.  The visualized bowel gas pattern is grossly unremarkable.  IMPRESSION:  1.  Mildly displaced basicervical fracture through the left femoral neck. 2.  Diffuse vascular calcifications seen.   Original Report Authenticated By: Tonia Ghent, M.D.    Ct Head Wo Contrast  02/22/2013  *RADIOLOGY REPORT*  Clinical Data: Fall with hip pain.  CT HEAD WITHOUT CONTRAST  Technique:  Contiguous axial images were obtained from the base of the skull through the vertex without contrast.  Comparison: 09/27/2012  Findings: Bone windows demonstrate mucosal thickening of the left maxillary sinus. Clear mastoid air cells.  Soft tissue windows demonstrate expected cerebral atrophy.  Mild low density in the periventricular white matter likely related to small vessel disease.Posterior left frontal lobe nonacute infarct, including on image 22/series 2. No  mass lesion, hemorrhage, hydrocephalus, acute infarct, intra-axial, or extra-axial fluid collection.  IMPRESSION:  1. No acute intracranial abnormality. 2.  Cerebral atrophy with remote left posterior frontal lobe infarct.   Original Report Authenticated By: Jeronimo Greaves, M.D.     Review of Systems  Musculoskeletal:       Left hip pain  All other systems reviewed and are negative.   Blood pressure 138/70, pulse 79, temperature 97.6 F (36.4 C), temperature source Oral, resp. rate 16, SpO2 91.00%. Physical Exam  Constitutional: He appears well-developed and well-nourished.  HENT:  Head: Normocephalic and atraumatic.  Eyes: EOM are normal. Pupils are equal, round, and reactive to light.  Respiratory: Effort normal.  Musculoskeletal:  L LE with BKA No swelling, erythema, warmth L hip TTP L LE soft, intact sensation to light touch  Neurological: He is alert.  Skin: Skin is warm and dry.  Psychiatric: He has a normal mood and affect. His behavior is normal.    Assessment/Plan: Displaced L femoral neck fracture Discussed treatment options with patient and  family today, surgical with hemiarthroplasty vs nonop, discussed risk vs benefit of fixation with surgery, to alleviate pain and achieve future ambulatory status and prevent complications of bed rest NPO after midnight Plan on L hip hemiarthroplasty tomorrow afternoon if medically cleared and INR acceptible Hold coumadin  Asmar Brozek 02/22/2013, 10:39 PM

## 2013-02-22 NOTE — ED Provider Notes (Signed)
History     CSN: [id number]  Arrival date & time 02/22/13  2031   First MD Initiated Contact with Patient 02/22/13 2032      Chief Complaint  Patient presents with  . Fall  . Hip Pain    (Consider location/radiation/quality/duration/timing/severity/associated sxs/prior treatment) HPI Comments: Patient presents via EMS after mechanical fall from home. Return to come out of the bathroom and fell onto his left side. He has a history of a left BKA with a prosthetic leg. Denies hitting head or losing consciousness. He is on Coumadin for history of atrial fibrillation. Denies chest pain, shortness of breath, abdominal pain. Denies any dizziness or lightheadedness. Daughter saw him fall and he did not lose consciousness.  The history is provided by the patient and the EMS personnel.    Past Medical History  Diagnosis Date  . Hypertension   . Diabetes mellitus   . CHF (congestive heart failure)   . Coronary artery disease   . Atrial fibrillation   . PVD (peripheral vascular disease)   . S/P CABG x 1 2008  . S/P aortic valve replacement with porcine valve 2008    Past Surgical History  Procedure Laterality Date  . Below knee leg amputation  07/2012  . Cataract extraction w/ intraocular lens implant    . Abdominal surgery    . Hernia repair      x5  . Back surgery      vertebroplasty  . Coronary artery bypass graft  2008    No family history on file.  History  Substance Use Topics  . Smoking status: Former Games developer  . Smokeless tobacco: Current User  . Alcohol Use: No      Review of Systems  Constitutional: Negative for activity change and appetite change.  HENT: Negative for congestion and rhinorrhea.   Respiratory: Negative for cough, chest tightness and shortness of breath.   Cardiovascular: Negative for chest pain.  Gastrointestinal: Negative for nausea, vomiting and abdominal pain.  Genitourinary: Negative for decreased urine volume.  Musculoskeletal: Positive  for myalgias and arthralgias. Negative for back pain.  Skin: Negative for rash.  Neurological: Negative for dizziness and headaches.  A complete 10 system review of systems was obtained and all systems are negative except as noted in the HPI and PMH.    Allergies  Metformin and related and Penicillins  Home Medications   Current Outpatient Rx  Name  Route  Sig  Dispense  Refill  . amLODipine (NORVASC) 5 MG tablet   Oral   Take 5 mg by mouth every morning.         Marland Kitchen aspirin EC 81 MG EC tablet   Oral   Take 1 tablet (81 mg total) by mouth daily.         . barrier cream (NON-SPECIFIED) CREA   Topical   Apply 1 application topically at bedtime as needed (for bed sores).         . Bisacodyl (DULCOLAX PO)   Oral   Take 1 tablet by mouth daily as needed (for constipation).         . Calcium Carbonate-Vitamin D (CALCIUM 600+D) 600-400 MG-UNIT per tablet   Oral   Take 1 tablet by mouth every morning.          . Cholecalciferol (VITAMIN D-3) 1000 UNITS CAPS   Oral   Take 1,000 Units by mouth every morning.          . Diphenhydramine-APAP, sleep, (TYLENOL PM EXTRA  STRENGTH PO)   Oral   Take 1-2 tablets by mouth at bedtime as needed (pt can take up to 2 tabs for sleep aide).         . DULoxetine (CYMBALTA) 30 MG capsule   Oral   Take 30 mg by mouth at bedtime.          . insulin glargine (LANTUS) 100 UNIT/ML injection   Subcutaneous   Inject 30 Units into the skin daily before breakfast.         . insulin lispro (HUMALOG) 100 UNIT/ML injection   Subcutaneous   Inject 3-5 Units into the skin 3 (three) times daily before meals. 5 units before breakfast, before lunch and 3 units before supper         . metoprolol succinate (TOPROL-XL) 50 MG 24 hr tablet   Oral   Take 75 mg by mouth 2 (two) times daily. Take with or immediately following a meal.         . nystatin cream (MYCOSTATIN)   Topical   Apply 1 application topically every evening. To groin          . potassium chloride (K-DUR,KLOR-CON) 10 MEQ tablet   Oral   Take 5-10 mEq by mouth 2 (two) times daily. Pt takes 10 meq with torsemide in the a.m. And 5 meq at night with the torsemide         . simvastatin (ZOCOR) 40 MG tablet   Oral   Take 40 mg by mouth every evening.         . torsemide (DEMADEX) 20 MG tablet   Oral   Take 20 mg by mouth 2 (two) times daily.         . Vitamins B1 B6 B12 LIQD   Oral   Take 5 mLs by mouth daily at 12 noon.         . warfarin (COUMADIN) 2.5 MG tablet   Oral   Take 2.5-3 mg by mouth every evening. 3 mg 6 days a week and 2.5 mg one day a week           BP 137/75  Pulse 73  Temp(Src) 97.6 F (36.4 C) (Oral)  Resp 16  SpO2 99%  Physical Exam  Constitutional: He is oriented to person, place, and time. He appears well-developed and well-nourished. No distress.  HENT:  Head: Normocephalic and atraumatic.  Mouth/Throat: Oropharynx is clear and moist. No oropharyngeal exudate.  Eyes: Conjunctivae and EOM are normal. Pupils are equal, round, and reactive to light.  Neck: Normal range of motion. Neck supple.  Cardiovascular: Normal rate, regular rhythm and normal heart sounds.   No murmur heard. Pulmonary/Chest: Effort normal and breath sounds normal. No respiratory distress.  Abdominal: Soft. There is no tenderness. There is no rebound and no guarding.  Musculoskeletal: Normal range of motion. He exhibits tenderness. He exhibits no edema.  There is tenderness and pain to palpation of the left hip. Left BKA.  Neurological: He is alert and oriented to person, place, and time. No cranial nerve deficit. He exhibits normal muscle tone. Coordination normal.  Skin: Skin is warm.    ED Course  Procedures (including critical care time)  Labs Reviewed  GLUCOSE, CAPILLARY - Abnormal; Notable for the following:    Glucose-Capillary 102 (*)    All other components within normal limits  CBC WITH DIFFERENTIAL - Abnormal; Notable for  the following:    WBC 19.7 (*)    Hemoglobin 11.7 (*)  HCT 36.0 (*)    MCH 25.9 (*)    RDW 16.5 (*)    Neutrophils Relative 90 (*)    Neutro Abs 17.8 (*)    Lymphocytes Relative 4 (*)    All other components within normal limits  PROTIME-INR - Abnormal; Notable for the following:    Prothrombin Time 23.7 (*)    INR 2.23 (*)    All other components within normal limits  COMPREHENSIVE METABOLIC PANEL - Abnormal; Notable for the following:    Glucose, Bld 145 (*)    BUN 61 (*)    Creatinine, Ser 1.69 (*)    GFR calc non Af Amer 35 (*)    GFR calc Af Amer 41 (*)    All other components within normal limits  TROPONIN I  TYPE AND SCREEN   Dg Chest 1 View  02/22/2013  *RADIOLOGY REPORT*  Clinical Data: Status post fall; concern for chest injury.  CHEST - 1 VIEW  Comparison: Chest radiograph performed 10/02/2012, and CT of the thoracic spine performed 10/02/2012  Findings: The lungs are well-aerated.  Mild vascular congestion is noted, without significant pulmonary edema.  There is no evidence of focal opacification, pleural effusion or pneumothorax.  The cardiomediastinal silhouette is borderline normal in size.  An aortic valve replacement is noted.  The patient is status post median sternotomy, with evidence of prior CABG.  No acute osseous abnormalities are seen.  IMPRESSION: Mild vascular congestion noted; lungs remain grossly clear.  No displaced rib fractures seen.   Original Report Authenticated By: Tonia Ghent, M.D.    Dg Hip Complete Left  02/22/2013  *RADIOLOGY REPORT*  Clinical Data: Status post fall; left hip pain.  LEFT HIP - COMPLETE 2+ VIEW  Comparison: None.  Findings: There appears to be a mildly displaced basicervical fracture through the left femoral neck; the fracture line is difficult to fully characterize, but there is abnormal alignment of the proximal left femur.  The right hip joint is grossly unremarkable in appearance.  Mild degenerative change is noted at the lower  lumbar spine.  The sacroiliac joints are grossly unremarkable in appearance.  Diffuse vascular calcifications are seen.  The visualized bowel gas pattern is grossly unremarkable.  IMPRESSION:  1.  Mildly displaced basicervical fracture through the left femoral neck. 2.  Diffuse vascular calcifications seen.   Original Report Authenticated By: Tonia Ghent, M.D.    Ct Head Wo Contrast  02/22/2013  *RADIOLOGY REPORT*  Clinical Data: Fall with hip pain.  CT HEAD WITHOUT CONTRAST  Technique:  Contiguous axial images were obtained from the base of the skull through the vertex without contrast.  Comparison: 09/27/2012  Findings: Bone windows demonstrate mucosal thickening of the left maxillary sinus. Clear mastoid air cells.  Soft tissue windows demonstrate expected cerebral atrophy.  Mild low density in the periventricular white matter likely related to small vessel disease.Posterior left frontal lobe nonacute infarct, including on image 22/series 2. No  mass lesion, hemorrhage, hydrocephalus, acute infarct, intra-axial, or extra-axial fluid collection.  IMPRESSION:  1. No acute intracranial abnormality. 2.  Cerebral atrophy with remote left posterior frontal lobe infarct.   Original Report Authenticated By: Jeronimo Greaves, M.D.      1. Left displaced femoral neck fracture, closed, initial encounter       MDM  Mechanical fall with left hip pain. History of BKA on the same side. On Coumadin for history of atrial fibrillation. No chest pain or shortness of breath.  Imaging remarkable for a left  femoral neck fracture. Discussed with Dr. Ave Filter who will evaluate patient. INR 2.2 WBC 19. Consider stress response. Has had leukocytosis previously. CXR negative.   Date: 02/22/2013  Rate: 69  Rhythm: atrial fibrillation  QRS Axis: normal  Intervals: normal  ST/T Wave abnormalities: nonspecific ST/T changes  Conduction Disutrbances:none  Narrative Interpretation:   Old EKG Reviewed:  unchanged      Glynn Octave, MD 02/22/13 [phone]

## 2013-02-23 DIAGNOSIS — I4891 Unspecified atrial fibrillation: Secondary | ICD-10-CM

## 2013-02-23 DIAGNOSIS — S72009A Fracture of unspecified part of neck of unspecified femur, initial encounter for closed fracture: Secondary | ICD-10-CM

## 2013-02-23 LAB — PROTIME-INR
INR: 2.58 — ABNORMAL HIGH (ref 0.00–1.49)
Prothrombin Time: 26.4 seconds — ABNORMAL HIGH (ref 11.6–15.2)

## 2013-02-23 LAB — TYPE AND SCREEN
ABO/RH(D): A POS
Antibody Screen: NEGATIVE

## 2013-02-23 LAB — GLUCOSE, CAPILLARY: Glucose-Capillary: 287 mg/dL — ABNORMAL HIGH (ref 70–99)

## 2013-02-23 LAB — SURGICAL PCR SCREEN: MRSA, PCR: NEGATIVE

## 2013-02-23 LAB — ABO/RH: ABO/RH(D): A POS

## 2013-02-23 MED ORDER — MORPHINE SULFATE 2 MG/ML IJ SOLN
2.0000 mg | INTRAMUSCULAR | Status: DC | PRN
  Administered 2013-02-23 – 2013-02-24 (×2): 2 mg via INTRAVENOUS
  Filled 2013-02-23 (×2): qty 1

## 2013-02-23 MED ORDER — SODIUM CHLORIDE 0.9 % IJ SOLN
3.0000 mL | Freq: Two times a day (BID) | INTRAMUSCULAR | Status: DC
  Administered 2013-02-27 – 2013-03-02 (×4): 3 mL via INTRAVENOUS

## 2013-02-23 MED ORDER — SODIUM CHLORIDE 0.9 % IJ SOLN
3.0000 mL | Freq: Two times a day (BID) | INTRAMUSCULAR | Status: DC
  Administered 2013-02-23 – 2013-03-02 (×7): 3 mL via INTRAVENOUS

## 2013-02-23 MED ORDER — TORSEMIDE 20 MG PO TABS
20.0000 mg | ORAL_TABLET | Freq: Two times a day (BID) | ORAL | Status: DC
  Filled 2013-02-23: qty 1

## 2013-02-23 MED ORDER — ASPIRIN EC 81 MG PO TBEC
81.0000 mg | DELAYED_RELEASE_TABLET | Freq: Every day | ORAL | Status: DC
  Administered 2013-02-24 – 2013-02-28 (×5): 81 mg via ORAL
  Filled 2013-02-23 (×6): qty 1

## 2013-02-23 MED ORDER — SODIUM CHLORIDE 0.9 % IJ SOLN
3.0000 mL | INTRAMUSCULAR | Status: DC | PRN
  Administered 2013-02-23 – 2013-03-01 (×2): 3 mL via INTRAVENOUS

## 2013-02-23 MED ORDER — POTASSIUM CHLORIDE CRYS ER 10 MEQ PO TBCR
5.0000 meq | EXTENDED_RELEASE_TABLET | ORAL | Status: DC
Start: 2013-02-23 — End: 2013-02-23
  Filled 2013-02-23: qty 1

## 2013-02-23 MED ORDER — POTASSIUM CHLORIDE 20 MEQ/15ML (10%) PO LIQD
5.0000 meq | Freq: Every day | ORAL | Status: DC
  Administered 2013-02-23: 5.0667 meq via ORAL
  Filled 2013-02-23 (×3): qty 7.5

## 2013-02-23 MED ORDER — POTASSIUM CHLORIDE CRYS ER 10 MEQ PO TBCR
10.0000 meq | EXTENDED_RELEASE_TABLET | ORAL | Status: DC
  Administered 2013-02-23 – 2013-02-24 (×2): 10 meq via ORAL
  Filled 2013-02-23 (×3): qty 1

## 2013-02-23 MED ORDER — AMLODIPINE BESYLATE 5 MG PO TABS
5.0000 mg | ORAL_TABLET | Freq: Every morning | ORAL | Status: DC
  Administered 2013-02-23 – 2013-02-26 (×4): 5 mg via ORAL
  Filled 2013-02-23 (×4): qty 1

## 2013-02-23 MED ORDER — BARRIER CREAM NON-SPECIFIED
1.0000 "application " | TOPICAL_CREAM | Freq: Every evening | TOPICAL | Status: DC | PRN
  Filled 2013-02-23: qty 1

## 2013-02-23 MED ORDER — ONDANSETRON HCL 4 MG/2ML IJ SOLN
4.0000 mg | Freq: Four times a day (QID) | INTRAMUSCULAR | Status: DC | PRN
  Administered 2013-02-28: 4 mg via INTRAVENOUS
  Filled 2013-02-23: qty 2

## 2013-02-23 MED ORDER — METOPROLOL SUCCINATE ER 50 MG PO TB24
75.0000 mg | ORAL_TABLET | Freq: Two times a day (BID) | ORAL | Status: DC
  Administered 2013-02-23 – 2013-02-25 (×6): 75 mg via ORAL
  Filled 2013-02-23 (×10): qty 1

## 2013-02-23 MED ORDER — SODIUM CHLORIDE 0.9 % IV SOLN: 250.0000 mL | INTRAVENOUS | Status: DC | PRN

## 2013-02-23 MED ORDER — NYSTATIN 100000 UNIT/GM EX CREA
1.0000 "application " | TOPICAL_CREAM | Freq: Every evening | CUTANEOUS | Status: DC
  Administered 2013-02-23 – 2013-03-01 (×4): 1 via TOPICAL
  Filled 2013-02-23 (×3): qty 15

## 2013-02-23 MED ORDER — SIMVASTATIN 40 MG PO TABS
40.0000 mg | ORAL_TABLET | Freq: Every evening | ORAL | Status: DC
  Filled 2013-02-23: qty 1

## 2013-02-23 MED ORDER — OXYCODONE-ACETAMINOPHEN 5-325 MG PO TABS
1.0000 | ORAL_TABLET | Freq: Four times a day (QID) | ORAL | Status: DC | PRN
  Administered 2013-02-23 – 2013-02-28 (×8): 1 via ORAL
  Filled 2013-02-23 (×10): qty 1

## 2013-02-23 MED ORDER — BISACODYL 5 MG PO TBEC
5.0000 mg | DELAYED_RELEASE_TABLET | Freq: Every day | ORAL | Status: DC | PRN
  Administered 2013-02-28: 5 mg via ORAL
  Filled 2013-02-23: qty 1

## 2013-02-23 MED ORDER — INSULIN ASPART 100 UNIT/ML ~~LOC~~ SOLN
0.0000 [IU] | SUBCUTANEOUS | Status: DC
  Administered 2013-02-23: 3 [IU] via SUBCUTANEOUS
  Administered 2013-02-23: 5 [IU] via SUBCUTANEOUS
  Administered 2013-02-23: 8 [IU] via SUBCUTANEOUS
  Administered 2013-02-23 (×3): 3 [IU] via SUBCUTANEOUS
  Administered 2013-02-24: 15 [IU] via SUBCUTANEOUS
  Administered 2013-02-24: 11 [IU] via SUBCUTANEOUS
  Administered 2013-02-24: 8 [IU] via SUBCUTANEOUS
  Administered 2013-02-24: 5 [IU] via SUBCUTANEOUS
  Administered 2013-02-25: 6 [IU] via SUBCUTANEOUS
  Administered 2013-02-25: 3 [IU] via SUBCUTANEOUS
  Administered 2013-02-25: 21:00:00 via SUBCUTANEOUS
  Administered 2013-02-26: 11 [IU] via SUBCUTANEOUS
  Administered 2013-02-26: 8 [IU] via SUBCUTANEOUS
  Administered 2013-02-26: 15 [IU] via SUBCUTANEOUS
  Administered 2013-02-26: 5 [IU] via SUBCUTANEOUS
  Administered 2013-02-27: 11 [IU] via SUBCUTANEOUS
  Administered 2013-02-27 (×2): 5 [IU] via SUBCUTANEOUS
  Administered 2013-02-27: 8 [IU] via SUBCUTANEOUS
  Administered 2013-02-27: 3 [IU] via SUBCUTANEOUS
  Administered 2013-02-27 – 2013-02-28 (×2): 5 [IU] via SUBCUTANEOUS
  Administered 2013-02-28 (×3): 3 [IU] via SUBCUTANEOUS
  Administered 2013-02-28: 8 [IU] via SUBCUTANEOUS
  Administered 2013-02-28: 3 [IU] via SUBCUTANEOUS
  Administered 2013-03-01: 8 [IU] via SUBCUTANEOUS
  Administered 2013-03-01: 11 [IU] via SUBCUTANEOUS
  Administered 2013-03-01 – 2013-03-02 (×2): 8 [IU] via SUBCUTANEOUS
  Administered 2013-03-02 (×2): 3 [IU] via SUBCUTANEOUS

## 2013-02-23 MED ORDER — DULOXETINE HCL 30 MG PO CPEP
30.0000 mg | ORAL_CAPSULE | Freq: Every day | ORAL | Status: DC
  Administered 2013-02-23 – 2013-03-01 (×7): 30 mg via ORAL
  Filled 2013-02-23 (×9): qty 1

## 2013-02-23 MED ORDER — KETOROLAC TROMETHAMINE 15 MG/ML IJ SOLN
15.0000 mg | INTRAMUSCULAR | Status: DC | PRN
  Administered 2013-02-23: 15 mg via INTRAVENOUS
  Filled 2013-02-23: qty 1

## 2013-02-23 MED ORDER — ONDANSETRON HCL 4 MG PO TABS: 4.0000 mg | ORAL_TABLET | Freq: Four times a day (QID) | ORAL | Status: DC | PRN

## 2013-02-23 MED ORDER — ATORVASTATIN CALCIUM 20 MG PO TABS
20.0000 mg | ORAL_TABLET | Freq: Every day | ORAL | Status: DC
  Administered 2013-02-23 – 2013-02-27 (×4): 20 mg via ORAL
  Filled 2013-02-23 (×6): qty 1

## 2013-02-23 NOTE — Consult Note (Signed)
ANTICOAGULATION CONSULT NOTE - Initial Consult  Pharmacy Consult for Heparin bridging Indication: History of A-fib, AVR  Allergies: Allergies  Allergen Reactions  . Metformin And Related Hives  . Penicillins Hives and Swelling    Height/Weight: Height: 5\' 7"  (170.2 cm) Weight: 150 lb 4.8 oz (68.176 kg) IBW/kg (Calculated) : 66.1 Dosing weight 68 kg  Vital Signs: BP 125/65  Pulse 78  Temp(Src) 98.1 F (36.7 C) (Oral)  Resp 16  Ht 5\' 7"  (1.702 m)  Wt 150 lb 4.8 oz (68.176 kg)  BMI 23.53 kg/m2  SpO2 97%  Active Problems: Principal Problem:   Hip fracture, left Active Problems:   Diabetes mellitus   A-fib   S/P CABG x 1   Aortic valve replaced   DNR (do not resuscitate)   Labs:  Recent Labs  02/22/13 2203 02/23/13 1050  HGB 11.7*  --   HCT 36.0*  --   PLT 260  --   LABPROT 23.7* 26.4*  INR 2.23* 2.58*  CREATININE 1.69*  --    Lab Results  Component Value Date   INR 2.58* 02/23/2013   INR 2.23* 02/22/2013   Estimated Creatinine Clearance: 29.3 ml/min (by C-G formula based on Cr of 1.69).  Medical / Surgical History: Past Medical History  Diagnosis Date  . Hypertension   . Diabetes mellitus   . CHF (congestive heart failure)   . Coronary artery disease   . Atrial fibrillation   . PVD (peripheral vascular disease)   . S/P CABG x 1 2008  . S/P aortic valve replacement with porcine valve 2008   Past Surgical History  Procedure Laterality Date  . Below knee leg amputation  07/2012  . Cataract extraction w/ intraocular lens implant    . Abdominal surgery    . Hernia repair      x5  . Back surgery      vertebroplasty  . Coronary artery bypass graft  2008    Medications:  Prescriptions prior to admission  Medication Sig Dispense Refill  . amLODipine (NORVASC) 5 MG tablet Take 5 mg by mouth every morning.      Marland Kitchen aspirin EC 81 MG EC tablet Take 1 tablet (81 mg total) by mouth daily.      . barrier cream (NON-SPECIFIED) CREA Apply 1 application  topically at bedtime as needed (for bed sores).      . Bisacodyl (DULCOLAX PO) Take 1 tablet by mouth daily as needed (for constipation).      . Calcium Carbonate-Vitamin D (CALCIUM 600+D) 600-400 MG-UNIT per tablet Take 1 tablet by mouth every morning.       . Cholecalciferol (VITAMIN D-3) 1000 UNITS CAPS Take 1,000 Units by mouth every morning.       . Diphenhydramine-APAP, sleep, (TYLENOL PM EXTRA STRENGTH PO) Take 1-2 tablets by mouth at bedtime as needed (pt can take up to 2 tabs for sleep aide).      . DULoxetine (CYMBALTA) 30 MG capsule Take 30 mg by mouth at bedtime.       . insulin glargine (LANTUS) 100 UNIT/ML injection Inject 30 Units into the skin daily before breakfast.      . insulin lispro (HUMALOG) 100 UNIT/ML injection Inject 3-5 Units into the skin 3 (three) times daily before meals. 5 units before breakfast, before lunch and 3 units before supper      . metoprolol succinate (TOPROL-XL) 50 MG 24 hr tablet Take 75 mg by mouth 2 (two) times daily. Take with or immediately  following a meal.      . nystatin cream (MYCOSTATIN) Apply 1 application topically every evening. To groin      . potassium chloride (K-DUR,KLOR-CON) 10 MEQ tablet Take 5-10 mEq by mouth 2 (two) times daily. Pt takes 10 meq with torsemide in the a.m. And 5 meq at night with the torsemide      . simvastatin (ZOCOR) 40 MG tablet Take 40 mg by mouth every evening.      . torsemide (DEMADEX) 20 MG tablet Take 20 mg by mouth 2 (two) times daily.      . traZODone (DESYREL) 50 MG tablet Take 25 mg by mouth at bedtime.      . Vitamins B1 B6 B12 LIQD Take 5 mLs by mouth daily at 12 noon.      . warfarin (COUMADIN) 2.5 MG tablet Take 2.5-3 mg by mouth every evening. 3 mg 6 days a week and 2.5 mg one day a week        Assessment:  79 y.o.male with Hip fracture, left.on chronic Coumadin for history of atrial fibrillation and AVR  Heparin bridging to be started in preparation for hip repair surgery when INR < 2.  INR up  today despite no Coumadin charted for 02/22/13.  Goal of Therapy:   Heparin level 0.3-0.7 units/ml      Plan:   No Heparin for now.  Repeat INR tonight and determine need for initiation of Heparin bridging as ordered.  Stramoski, Elisha Headland,  Pharm.D.. 02/23/2013,  11:23 AM

## 2013-02-23 NOTE — Progress Notes (Signed)
[facility] PROGRESS NOTE  Aaron [name] ZOX:[id number] DOB: [birth year] DOA: 02/22/2013 PCP: Danella Penton., MD  Assessment/Plan: Principal Problem:   Hip fracture, left Active Problems:   Diabetes mellitus   A-fib   S/P CABG x 1   Aortic valve replaced   DNR (do not resuscitate)     1. Hip fracture-on anticoagulation, will allow the INR to drift down, bridge with heparin instead of Lovenox due to easy reversibility, anticipate surgery today or tomorrow 2. Atrial fibrillation /aortic valve replacement with a porcine valveon Coumadin, start heparin drip when INR less than 2 without a bolus 3. Prior history of CVA in the setting of subtherapeutic INR 4. Diabetes mellitus continue sliding scale insulin  Code Status: DO NOT RESUSCITATE Family Communication: family updated about patient's clinical progress Disposition Plan:  As above    Brief narrative: Aaron [name] is an 79 y.o. 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 His son in law was there and witnessed this mechanical fall. Evaluation in emergency room included a head CT which showed no acute process. At the time of his hip showed left femoral neck fracture with mild displacement. His INR is 2.23, white count 19.7 thousand, hemoglobin of 11.7. His creatinine is 1.7 with BUN of 61. Orthopedics was consulted by [facility] and has seen the patient. Hospitalist was asked to admit him for hip fracture   Consultants: Mable Paris, MD   Procedures:  none  Antibiotics:  none  HPI/Subjective: Denies any pain comfortable  Objective: Filed Vitals:    02/22/13 2300 02/23/13 0015 02/23/13 0110 02/23/13 0405  BP: 137/75 121/63 137/58 125/65  Pulse: 73 67 68 78  Temp:   97.3 F (36.3 C) 98.1 F (36.7 C)  TempSrc:   Oral Oral  Resp: 16 21 20 16   Height:   5\' 7"  (1.702 m)   Weight:   68.176 kg (150 lb 4.8 oz)   SpO2: 99% 98% 100% 97%    Intake/Output Summary (Last 24 hours) at 02/23/13 0930 Last data filed at 02/23/13 0800  Gross per 24 hour  Intake      0 ml  Output    275 ml  Net   -275 ml    Exam: HEENT: Mucous membranes pink and anicteric; PERRLA; EOM intact; no cervical lymphadenopathy nor thyromegaly or carotid bruit; no JVD; There were no stridor. Neck is very supple.  Breasts:: Not examined  CHEST WALL: No tenderness  CHEST: Normal respiration, clear to auscultation bilaterally.  HEART: Irregular rate and rhythm. There is a soft 2-3/6 systolic ejection murmur at the left sternal border, rub, or gallops.  BACK: No kyphosis or scoliosis; no CVA tenderness  ABDOMEN: soft and non-tender; no masses, no organomegaly, normal abdominal bowel sounds; no pannus; no intertriginous candida. There is no rebound and no distention.  Rectal Exam: Not done Left hip has tenderness to palpation, pain with any range of motion. Skin is intact    Data Reviewed: Basic Metabolic Panel:  Recent Labs Lab 02/22/13 2203  NA 138  K 4.4  CL 100  CO2 23  GLUCOSE 145*  BUN 61*  CREATININE 1.69*  CALCIUM 9.3    Liver Function Tests:  Recent Labs Lab 02/22/13  2203  AST 24  ALT 20  ALKPHOS 66  BILITOT 0.4  PROT 7.4  ALBUMIN 3.6   No results found for this basename: LIPASE, AMYLASE,  in the last 168 hours No results found for this basename: AMMONIA,  in the last 168 hours  CBC:  Recent Labs Lab 02/22/13 2203  WBC 19.7*  NEUTROABS 17.8*  HGB 11.7*  HCT 36.0*  MCV 79.8  PLT 260    Cardiac Enzymes:  Recent Labs Lab 02/22/13 2203  TROPONINI <0.30   BNP (last 3 results)  Recent Labs  09/30/12 1233  PROBNP  29199.0*     CBG:  Recent Labs Lab 02/22/13 2045 02/23/13 0107 02/23/13 0407 02/23/13 0803  GLUCAP 102* 191* 190* 165*    Recent Results (from the past 240 hour(s))  SURGICAL PCR SCREEN     Status: None   Collection Time    02/23/13  5:15 AM      Result Value Range Status   MRSA, PCR NEGATIVE  NEGATIVE Final   Staphylococcus aureus NEGATIVE  NEGATIVE Final   Comment:            The Xpert SA Assay (FDA     approved for NASAL specimens     in patients over 32 years of age),     is one component of     a comprehensive surveillance     program.  Test performance has     been validated by [employer] for patients greater     than or equal to 21 year old.     It is not intended     to diagnose infection nor to     guide or monitor treatment.     Studies: Dg Chest 1 View  02/22/2013  *RADIOLOGY REPORT*  Clinical Data: Status post fall; concern for chest injury.  CHEST - 1 VIEW  Comparison: Chest radiograph performed 10/02/2012, and CT of the thoracic spine performed 10/02/2012  Findings: The lungs are well-aerated.  Mild vascular congestion is noted, without significant pulmonary edema.  There is no evidence of focal opacification, pleural effusion or pneumothorax.  The cardiomediastinal silhouette is borderline normal in size.  An aortic valve replacement is noted.  The patient is status post median sternotomy, with evidence of prior CABG.  No acute osseous abnormalities are seen.  IMPRESSION: Mild vascular congestion noted; lungs remain grossly clear.  No displaced rib fractures seen.   Original Report Authenticated By: Tonia Ghent, M.D.    Dg Hip Complete Left  02/22/2013  *RADIOLOGY REPORT*  Clinical Data: Status post fall; left hip pain.  LEFT HIP - COMPLETE 2+ VIEW  Comparison: None.  Findings: There appears to be a mildly displaced basicervical fracture through the left femoral neck; the fracture line is difficult to fully characterize, but there is abnormal alignment of  the proximal left femur.  The right hip joint is grossly unremarkable in appearance.  Mild degenerative change is noted at the lower lumbar spine.  The sacroiliac joints are grossly unremarkable in appearance.  Diffuse vascular calcifications are seen.  The visualized bowel gas pattern is grossly unremarkable.  IMPRESSION:  1.  Mildly displaced basicervical fracture through the left femoral neck. 2.  Diffuse vascular calcifications seen.   Original Report Authenticated By: Tonia Ghent, M.D.    Ct Head Wo Contrast  02/22/2013  *RADIOLOGY REPORT*  Clinical Data: Fall with hip pain.  CT HEAD WITHOUT CONTRAST  Technique:  Contiguous axial images were  obtained from the base of the skull through the vertex without contrast.  Comparison: 09/27/2012  Findings: Bone windows demonstrate mucosal thickening of the left maxillary sinus. Clear mastoid air cells.  Soft tissue windows demonstrate expected cerebral atrophy.  Mild low density in the periventricular white matter likely related to small vessel disease.Posterior left frontal lobe nonacute infarct, including on image 22/series 2. No  mass lesion, hemorrhage, hydrocephalus, acute infarct, intra-axial, or extra-axial fluid collection.  IMPRESSION:  1. No acute intracranial abnormality. 2.  Cerebral atrophy with remote left posterior frontal lobe infarct.   Original Report Authenticated By: Jeronimo Greaves, M.D.     Scheduled Meds: . amLODipine  5 mg Oral q morning - 10a  . aspirin EC  81 mg Oral Daily  . chlorhexidine  60 mL Topical Once  . clindamycin (CLEOCIN) IV  900 mg Intravenous On Call to OR  . DULoxetine  30 mg Oral QHS  . insulin aspart  0-15 Units Subcutaneous Q4H  . metoprolol succinate  75 mg Oral BID  . nystatin cream  1 application Topical QPM  . potassium chloride  10 mEq Oral Q24H  . potassium chloride  5.0667 mEq Oral q1800  . simvastatin  40 mg Oral QPM  . sodium chloride  3 mL Intravenous Q12H  . sodium chloride  3 mL Intravenous Q12H    Continuous Infusions:   Principal Problem:   Hip fracture, left Active Problems:   Diabetes mellitus   A-fib   S/P CABG x 1   Aortic valve replaced   DNR (do not resuscitate)    Time spent: 40 minutes   [facility]  [facility] Pager [phone]. If 8PM-8AM, please contact night-coverage at [email or web address], password [facility] 02/23/2013, 9:30 AM  LOS: 1 day

## 2013-02-23 NOTE — H&P (Signed)
[facility] History and Physical  Aaron [name] ZOX:[id number] DOB: Oct 27, 1924    PCP:   Danella Penton., MD   Chief Complaint: Mechanical fall, left hip fracture.  HPI: Aaron [name] is an 79 y.o. male, he retired  Public affairs consultant, lives at home with his daughter and son-in-law, history of atrial fibrillation on chronic anticoagulation, status post porcine  aortic valve replacement, coronary bypass graft time 1, diabetes, hypertension, history of congestive heart failure, peripheral vascular disease, status post left BKA, walking with his leg prosthesis, fell today without any loss of consciousness. His son in law was there and witnessed this mechanical fall. Evaluation in emergency room included a head CT which showed no acute process. At the time of his hip showed left femoral neck fracture with mild displacement. His INR is 2.23, white count 19.7 thousand, hemoglobin of 11.7. His creatinine is 1.7 with BUN of 61. Orthopedics was consulted by DEP and has seen the patient. Hospitalist was asked to admit him for hip fracture.  Rewiew of Systems:  Constitutional: Negative for malaise, fever and chills. No significant weight loss or weight gain Eyes: Negative for eye pain, redness and discharge, diplopia, visual changes, or flashes of light. ENMT: Negative for ear pain, hoarseness, nasal congestion, sinus pressure and sore throat. No headaches; tinnitus, drooling, or problem swallowing. Cardiovascular: Negative for chest pain, palpitations, diaphoresis, dyspnea and peripheral edema. ; No orthopnea, PND Respiratory: Negative for cough, hemoptysis, wheezing and stridor. No pleuritic chestpain. Gastrointestinal: Negative for nausea, vomiting, diarrhea, constipation, abdominal pain, melena, blood in stool, hematemesis, jaundice and rectal bleeding.    Genitourinary: Negative for frequency, dysuria, incontinence,flank pain and hematuria; Musculoskeletal: Negative for back pain and neck pain.  Negative for swelling and trauma.;  Skin: . Negative for pruritus, rash, abrasions, bruising and skin lesion.; ulcerations Neuro: Negative for headache, lightheadedness and neck stiffness. Negative for weakness, altered level of consciousness , altered mental status, extremity weakness, burning feet, involuntary movement, seizure and syncope.  Psych: negative for anxiety, depression, insomnia, tearfulness, panic attacks, hallucinations, paranoia, suicidal or homicidal ideation    Past Medical History  Diagnosis Date  . Hypertension   . Diabetes mellitus   . CHF (congestive heart failure)   . Coronary artery disease   . Atrial fibrillation   . PVD (peripheral vascular disease)   . S/P CABG x 1 2008  . S/P aortic valve replacement with porcine valve 2008    Past Surgical History  Procedure Laterality Date  . Below knee leg amputation  07/2012  . Cataract extraction w/ intraocular lens implant    . Abdominal surgery    . Hernia repair      x5  . Back surgery      vertebroplasty  . Coronary artery bypass graft  2008    Medications:  HOME MEDS: Prior to Admission medications   Medication Sig Start Date End Date Taking? Authorizing Aaron [name]  amLODipine (NORVASC) 5 MG tablet Take 5 mg by mouth every morning.   Yes Historical Aaron Choinski, MD  aspirin EC 81 MG EC tablet Take 1 tablet (81 mg total) by mouth daily. 10/03/12  Yes Almyra Deforest, MD  barrier cream (NON-SPECIFIED) CREA Apply 1 application topically at bedtime as needed (for bed sores).   Yes Historical Aaron Suder, MD  Bisacodyl (DULCOLAX PO) Take 1 tablet by mouth daily as needed (for constipation).   Yes Historical Aaron Houseworth, MD  Calcium Carbonate-Vitamin D (CALCIUM 600+D) 600-400 MG-UNIT per tablet Take 1 tablet by mouth every morning.  Yes Historical Aaron Hosmer, MD  Cholecalciferol (VITAMIN D-3) 1000 UNITS CAPS Take 1,000 Units by mouth every morning.    Yes Historical Aaron Hornbeck, MD  Diphenhydramine-APAP, sleep, (TYLENOL PM  EXTRA STRENGTH PO) Take 1-2 tablets by mouth at bedtime as needed (pt can take up to 2 tabs for sleep aide).   Yes Historical Aaron Chism, MD  DULoxetine (CYMBALTA) 30 MG capsule Take 30 mg by mouth at bedtime.    Yes Historical Aaron Nicole, MD  insulin glargine (LANTUS) 100 UNIT/ML injection Inject 30 Units into the skin daily before breakfast.   Yes Historical Aaron Standage, MD  insulin lispro (HUMALOG) 100 UNIT/ML injection Inject 3-5 Units into the skin 3 (three) times daily before meals. 5 units before breakfast, before lunch and 3 units before supper   Yes Historical Aaron Surgeon, MD  metoprolol succinate (TOPROL-XL) 50 MG 24 hr tablet Take 75 mg by mouth 2 (two) times daily. Take with or immediately following a meal.   Yes Historical Aaron Carbonell, MD  nystatin cream (MYCOSTATIN) Apply 1 application topically every evening. To groin   Yes Historical Aaron Fischbach, MD  potassium chloride (K-DUR,KLOR-CON) 10 MEQ tablet Take 5-10 mEq by mouth 2 (two) times daily. Pt takes 10 meq with torsemide in the a.m. And 5 meq at night with the torsemide   Yes Historical Aaron Senseney, MD  simvastatin (ZOCOR) 40 MG tablet Take 40 mg by mouth every evening.   Yes Historical Aaron Conover, MD  torsemide (DEMADEX) 20 MG tablet Take 20 mg by mouth 2 (two) times daily.   Yes Historical Aaron Brouillard, MD  traZODone (DESYREL) 50 MG tablet Take 25 mg by mouth at bedtime.   Yes Historical Aaron Willhite, MD  Vitamins B1 B6 B12 LIQD Take 5 mLs by mouth daily at 12 noon.   Yes Historical Aaron Barkow, MD  warfarin (COUMADIN) 2.5 MG tablet Take 2.5-3 mg by mouth every evening. 3 mg 6 days a week and 2.5 mg one day a week   Yes Historical Aaron Hogrefe, MD     Allergies:  Allergies  Allergen Reactions  . Metformin And Related Hives  . Penicillins Hives and Swelling    Social History:   reports that he has quit smoking. He uses smokeless tobacco. He reports that he does not drink alcohol or use illicit drugs.  Family History: No family history on file.   Physical  Exam: Filed Vitals:   02/22/13 2245 02/22/13 2300 02/23/13 0015 02/23/13 0110  BP: 144/60 137/75 121/63 137/58  Pulse: 80 73 67 68  Temp:    97.3 F (36.3 C)  TempSrc:    Oral  Resp: 19 16 21 20   Height:    5\' 7"  (1.702 m)  Weight:    68.176 kg (150 lb 4.8 oz)  SpO2: 99% 99% 98% 100%   Blood pressure 137/58, pulse 68, temperature 97.3 F (36.3 C), temperature source Oral, resp. rate 20, height 5\' 7"  (1.702 m), weight 68.176 kg (150 lb 4.8 oz), SpO2 100.00%.  GEN:  Pleasant patient lying in the stretcher in no acute distress; cooperative with exam. PSYCH:  alert and oriented x4; does not appear anxious or depressed; affect is appropriate. HEENT: Mucous membranes pink and anicteric; PERRLA; EOM intact; no cervical lymphadenopathy nor thyromegaly or carotid bruit; no JVD; There were no stridor. Neck is very supple. Breasts:: Not examined CHEST WALL: No tenderness CHEST: Normal respiration, clear to auscultation bilaterally.  HEART: Irregular rate and rhythm.  There is a soft 2-3/6 systolic ejection murmur at the left sternal border, rub, or gallops.  BACK: No kyphosis or scoliosis; no CVA tenderness ABDOMEN: soft and non-tender; no masses, no organomegaly, normal abdominal bowel sounds; no pannus; no intertriginous candida. There is no rebound and no distention. Rectal Exam: Not done EXTREMITIES: No bone or joint deformity; age-appropriate arthropathy of the hands and knees; no edema; no ulcerations.  There is no calf tenderness. Genitalia: not examined PULSES: 2+ and symmetric SKIN: Normal hydration no rash or ulceration CNS: Cranial nerves 2-12 grossly intact no focal lateralizing neurologic deficit.  Speech is fluent; uvula elevated with phonation, facial symmetry and tongue midline. DTR are normal bilaterally, cerebella exam is intact, barbinski is negative and strengths are equaled bilaterally.  No sensory loss.   Labs on Admission:  Basic Metabolic Panel:  Recent Labs Lab  02/22/13 2203  NA 138  K 4.4  CL 100  CO2 23  GLUCOSE 145*  BUN 61*  CREATININE 1.69*  CALCIUM 9.3   Liver Function Tests:  Recent Labs Lab 02/22/13 2203  AST 24  ALT 20  ALKPHOS 66  BILITOT 0.4  PROT 7.4  ALBUMIN 3.6   No results found for this basename: LIPASE, AMYLASE,  in the last 168 hours No results found for this basename: AMMONIA,  in the last 168 hours CBC:  Recent Labs Lab 02/22/13 2203  WBC 19.7*  NEUTROABS 17.8*  HGB 11.7*  HCT 36.0*  MCV 79.8  PLT 260   Cardiac Enzymes:  Recent Labs Lab 02/22/13 2203  TROPONINI <0.30    CBG:  Recent Labs Lab 02/22/13 2045 02/23/13 0107  GLUCAP 102* 191*     Radiological Exams on Admission: Dg Chest 1 View  02/22/2013  *RADIOLOGY REPORT*  Clinical Data: Status post fall; concern for chest injury.  CHEST - 1 VIEW  Comparison: Chest radiograph performed 10/02/2012, and CT of the thoracic spine performed 10/02/2012  Findings: The lungs are well-aerated.  Mild vascular congestion is noted, without significant pulmonary edema.  There is no evidence of focal opacification, pleural effusion or pneumothorax.  The cardiomediastinal silhouette is borderline normal in size.  An aortic valve replacement is noted.  The patient is status post median sternotomy, with evidence of prior CABG.  No acute osseous abnormalities are seen.  IMPRESSION: Mild vascular congestion noted; lungs remain grossly clear.  No displaced rib fractures seen.   Original Report Authenticated By: Tonia Ghent, M.D.    Dg Hip Complete Left  02/22/2013  *RADIOLOGY REPORT*  Clinical Data: Status post fall; left hip pain.  LEFT HIP - COMPLETE 2+ VIEW  Comparison: None.  Findings: There appears to be a mildly displaced basicervical fracture through the left femoral neck; the fracture line is difficult to fully characterize, but there is abnormal alignment of the proximal left femur.  The right hip joint is grossly unremarkable in appearance.  Mild  degenerative change is noted at the lower lumbar spine.  The sacroiliac joints are grossly unremarkable in appearance.  Diffuse vascular calcifications are seen.  The visualized bowel gas pattern is grossly unremarkable.  IMPRESSION:  1.  Mildly displaced basicervical fracture through the left femoral neck. 2.  Diffuse vascular calcifications seen.   Original Report Authenticated By: Tonia Ghent, M.D.    Ct Head Wo Contrast  02/22/2013  *RADIOLOGY REPORT*  Clinical Data: Fall with hip pain.  CT HEAD WITHOUT CONTRAST  Technique:  Contiguous axial images were obtained from the base of the skull through the vertex without contrast.  Comparison: 09/27/2012  Findings: Bone windows demonstrate mucosal thickening of the left  maxillary sinus. Clear mastoid air cells.  Soft tissue windows demonstrate expected cerebral atrophy.  Mild low density in the periventricular white matter likely related to small vessel disease.Posterior left frontal lobe nonacute infarct, including on image 22/series 2. No  mass lesion, hemorrhage, hydrocephalus, acute infarct, intra-axial, or extra-axial fluid collection.  IMPRESSION:  1. No acute intracranial abnormality. 2.  Cerebral atrophy with remote left posterior frontal lobe infarct.   Original Report Authenticated By: Jeronimo Greaves, M.D.     EKG: Independently reviewed. A. fib with rate control.   Assessment/Plan Present on Admission:  . Diabetes mellitus . A-fib ++ Left hip fracture Hypertension Porcine aortic valve replacement  chronic anticoagulation DO NOT RESUSCITATE CODE STATUS  PLAN:  Will admit him for left hip fracture. He will be given pain medication. Accepting and increased risk for perioperative cardiovascular complications, he is clear for surgery as this is the best treatment option for him. His INR is only slightly elevated, and I have stopped his Coumadin.  His leukocytosis likely is demargination and hemoconcentration, and she has no evidence of  infection.  His BUN and creatinine are slightly elevated. I will therefore stop his Demadex. I have not given him any IV fluid as he does have congestive heart failure. For his diabetes, have stopped his insulin as he is n.p.o. anticipating surgery. Will use insulin sliding scale as needed. I had a chance to discuss his CODE STATUS, and confirmed that he is a DO NOT RESUSCITATE. We will honor his wish. He is stable and will be [facility]. Thank you for allowing me to participate in the care of this nice and pleasant patient  Other plans as per orders.  Code Status:DNR   Houston Siren, MD. [facility] Pager [phone] 7pm to 7am.  02/23/2013, 1:47 AM

## 2013-02-23 NOTE — Progress Notes (Signed)
PATIENT ID: Aaron [name]     Procedure(s) (LRB): ARTHROPLASTY BIPOLAR HIP (Left)  Subjective: comfortable. No acute events overnight.  Objective:  Filed Vitals:   02/23/13 0405  BP: 125/65  Pulse: 78  Temp: 98.1 F (36.7 C)  Resp: 16     Left hip has tenderness to palpation, pain with any range of motion. Skin is intact.  Labs:   Recent Labs  02/22/13 2203  HGB 11.7*   Recent Labs  02/22/13 2203  WBC 19.7*  RBC 4.51  HCT 36.0*  PLT 260   Recent Labs  02/22/13 2203  NA 138  K 4.4  CL 100  CO2 23  BUN 61*  CREATININE 1.69*  GLUCOSE 145*  CALCIUM 9.3    Assessment and Plan: left hip displaced femoral neck fracture Plan for left hip hemiarthroplasty when medically ready. INR in therapeutic range at 2.23. I spoke with the hospitalist who recommends letting this drift down slowly with a heparin drip once it is below 2.   Will recheck today at around noon. If it is in a safe range we could do the surgery this afternoon. However more likely we will delay until tomorrow and recheck INR in the morning.

## 2013-02-23 NOTE — Consult Note (Signed)
Agree with above plan. The patient suffered a fall and has a displaced femoral neck fracture. The patient and his family are interested in him continuing to have the ability to ambulate. He is on Coumadin and we will need to wait until his INR comes back into a safe range to perform the surgery.

## 2013-02-24 LAB — BASIC METABOLIC PANEL
BUN: 65 mg/dL — ABNORMAL HIGH (ref 6–23)
CO2: 23 mEq/L (ref 19–32)
Calcium: 8.7 mg/dL (ref 8.4–10.5)
Chloride: 98 mEq/L (ref 96–112)
Creatinine, Ser: 2.02 mg/dL — ABNORMAL HIGH (ref 0.50–1.35)

## 2013-02-24 LAB — CBC
HCT: 30.5 % — ABNORMAL LOW (ref 39.0–52.0)
MCH: 25.9 pg — ABNORMAL LOW (ref 26.0–34.0)
MCV: 79 fL (ref 78.0–100.0)
RBC: 3.86 MIL/uL — ABNORMAL LOW (ref 4.22–5.81)
WBC: 17.4 10*3/uL — ABNORMAL HIGH (ref 4.0–10.5)

## 2013-02-24 LAB — GLUCOSE, CAPILLARY
Glucose-Capillary: 265 mg/dL — ABNORMAL HIGH (ref 70–99)
Glucose-Capillary: 326 mg/dL — ABNORMAL HIGH (ref 70–99)

## 2013-02-24 LAB — PROTIME-INR: INR: 2.21 — ABNORMAL HIGH (ref 0.00–1.49)

## 2013-02-24 MED ORDER — DIPHENHYDRAMINE HCL 50 MG/ML IJ SOLN
12.5000 mg | Freq: Four times a day (QID) | INTRAMUSCULAR | Status: DC | PRN
  Administered 2013-02-24 (×2): 12.5 mg via INTRAVENOUS
  Filled 2013-02-24 (×2): qty 1

## 2013-02-24 MED ORDER — SODIUM CHLORIDE 0.9 % IV SOLN: 250.0000 mL | INTRAVENOUS | Status: DC | PRN

## 2013-02-24 MED ORDER — SODIUM POLYSTYRENE SULFONATE 15 GM/60ML PO SUSP
15.0000 g | Freq: Once | ORAL | Status: AC
  Administered 2013-02-24: 15 g via ORAL
  Filled 2013-02-24: qty 60

## 2013-02-24 MED ORDER — VITAMIN K1 1 MG/0.5ML IJ SOLN
0.5000 mg | Freq: Once | INTRAVENOUS | Status: AC
  Administered 2013-02-24: 0.5 mg via INTRAVENOUS
  Filled 2013-02-24: qty 0.25

## 2013-02-24 MED ORDER — VITAMIN K1 10 MG/ML IJ SOLN
0.5000 mg | Freq: Once | INTRAVENOUS | Status: DC
  Filled 2013-02-24: qty 0.05

## 2013-02-24 NOTE — Progress Notes (Signed)
PATIENT ID: Aaron [name]     Procedure(s) (LRB): ARTHROPLASTY BIPOLAR HIP (Left)  Subjective: Reports lying comfortably. No events overnight. Eating this am without problems. No complaints or concerns.  Objective:  Filed Vitals:   02/24/13 0420  BP: 104/54  Pulse: 66  Temp: 100.1 F (37.8 C)  Resp: 18     Left hip tenderness to palpation, pain with any ROM Skin intact  Labs:   Recent Labs  02/22/13 2203 02/24/13 0625  HGB 11.7* 10.0*   Recent Labs  02/22/13 2203 02/24/13 0625  WBC 19.7* 17.4*  RBC 4.51 3.86*  HCT 36.0* 30.5*  PLT 260 210   Recent Labs  02/22/13 2203 02/24/13 0625  NA 138 135  K 4.4 5.2*  CL 100 98  CO2 23 23  BUN 61* 65*  CREATININE 1.69* 2.02*  GLUCOSE 145* 268*  CALCIUM 9.3 8.7    Assessment and Plan: left hip displaced femoral neck fracture  Plan for left hip hemiarthroplasty when medically ready.  INR in therapeutic range at 2.46.hospitalist who recommends letting this drift down slowly with a heparin drip once it is below 2.  Will recheck tomorrow am.  If it is in a safe range we could do the surgery tomorrow evening.  Will make NPO after midnight

## 2013-02-24 NOTE — Progress Notes (Signed)
[facility] PROGRESS NOTE  Aaron [name] HYQ:[id number] DOB: May 18, 1924 DOA: 02/22/2013 PCP: Danella Penton., MD  Assessment/Plan: Principal Problem:   Hip fracture, left Active Problems:   Diabetes mellitus   A-fib   S/P CABG x 1   Aortic valve replaced   DNR (do not resuscitate)    1. Hip fracture-on anticoagulation, will allow the INR to drift down, we'll give 0.5 mg off vitamin K, bridge with heparin instead of Lovenox due to easy reversibility, anticipate surgery today or tomorrow 2. Atrial fibrillation /aortic valve replacement with a porcine valveon Coumadin, start heparin drip when INR less than 2 without a bolus 3. Prior history of CVA in the setting of subtherapeutic INR 4. Diabetes mellitus continue sliding scale insulin 5. Acute on chronic kidney injury creatinine is gone up from 1.69-2.0 to baseline of about 1.05 we'll start the patient on IV hydration, discontinue Toradol 6.  Code Status: DO NOT RESUSCITATE  Family Communication: family updated about patient's clinical progress  Disposition Plan: As above   Brief narrative:  Aaron [name] is an 79 y.o. 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 His son in law was there and witnessed this mechanical fall. Evaluation in emergency room included a head CT which showed no acute process. At the time of his hip showed left femoral neck fracture with mild displacement. His INR is 2.23, white count 19.7 thousand, hemoglobin of 11.7. His creatinine is 1.7 with BUN of 61. Orthopedics was consulted by DEP and has seen the patient. Hospitalist was asked to admit him for  hip fracture  Consultants:  Mable Paris, MD  Procedures:  none Antibiotics:  none   Reports lying comfortably. No events overnight. Eating this am without problems. No complaints or concerns   Objective: Filed Vitals:   02/23/13 0405 02/23/13 1443 02/23/13 2011 02/24/13 0420  BP: 125/65 125/61 111/40 104/54  Pulse: 78 69 58 66  Temp: 98.1 F (36.7 C) 98.4 F (36.9 C) 98.7 F (37.1 C) 100.1 F (37.8 C)  TempSrc: Oral Oral Oral Oral  Resp: 16 16 18 18   Height:      Weight:      SpO2: 97% 97% 92% 91%    Intake/Output Summary (Last 24 hours) at 02/24/13 1040 Last data filed at 02/24/13 0420  Gross per 24 hour  Intake      3 ml  Output    300 ml  Net   -297 ml    Exam:  HENT:  Head: Atraumatic.  Nose: Nose normal.  Mouth/Throat: Oropharynx is clear and moist.  Eyes: Conjunctivae are normal. Pupils are equal, round, and reactive to light. No scleral icterus.  Neck: Neck supple. No tracheal deviation present.  Cardiovascular: Normal rate, regular rhythm, normal heart sounds and intact distal pulses.  Pulmonary/Chest: Effort normal and breath sounds normal. No respiratory distress.  Abdominal: Soft. Normal appearance and bowel sounds are normal. She exhibits no distension. There is no tenderness.  Musculoskeletal: She exhibits no edema and no tenderness.  Neurological: She is alert. No cranial nerve deficit.    Data Reviewed: Basic Metabolic Panel:  Recent Labs Lab 02/22/13 2203 02/24/13 0625  NA 138 135  K 4.4 5.2*  CL 100  98  CO2 23 23  GLUCOSE 145* 268*  BUN 61* 65*  CREATININE 1.69* 2.02*  CALCIUM 9.3 8.7    Liver Function Tests:  Recent Labs Lab 02/22/13 2203  AST 24  ALT 20  ALKPHOS 66  BILITOT 0.4  PROT 7.4  ALBUMIN 3.6   No results found for this basename: LIPASE, AMYLASE,  in the last 168 hours No results found for this basename: AMMONIA,  in the last 168 hours  CBC:  Recent Labs Lab 02/22/13 2203 02/24/13 0625   WBC 19.7* 17.4*  NEUTROABS 17.8*  --   HGB 11.7* 10.0*  HCT 36.0* 30.5*  MCV 79.8 79.0  PLT 260 210    Cardiac Enzymes:  Recent Labs Lab 02/22/13 2203  TROPONINI <0.30   BNP (last 3 results)  Recent Labs  09/30/12 1233  PROBNP 29199.0*     CBG:  Recent Labs Lab 02/23/13 1614 02/23/13 2009 02/24/13 0003 02/24/13 0313 02/24/13 0814  GLUCAP 287* 217* 149* 220* 265*    Recent Results (from the past 240 hour(s))  SURGICAL PCR SCREEN     Status: None   Collection Time    02/23/13  5:15 AM      Result Value Range Status   MRSA, PCR NEGATIVE  NEGATIVE Final   Staphylococcus aureus NEGATIVE  NEGATIVE Final   Comment:            The Xpert SA Assay (FDA     approved for NASAL specimens     in patients over 78 years of age),     is one component of     a comprehensive surveillance     program.  Test performance has     been validated by [employer] for patients greater     than or equal to 12 year old.     It is not intended     to diagnose infection nor to     guide or monitor treatment.     Studies: Dg Chest 1 View  02/22/2013  *RADIOLOGY REPORT*  Clinical Data: Status post fall; concern for chest injury.  CHEST - 1 VIEW  Comparison: Chest radiograph performed 10/02/2012, and CT of the thoracic spine performed 10/02/2012  Findings: The lungs are well-aerated.  Mild vascular congestion is noted, without significant pulmonary edema.  There is no evidence of focal opacification, pleural effusion or pneumothorax.  The cardiomediastinal silhouette is borderline normal in size.  An aortic valve replacement is noted.  The patient is status post median sternotomy, with evidence of prior CABG.  No acute osseous abnormalities are seen.  IMPRESSION: Mild vascular congestion noted; lungs remain grossly clear.  No displaced rib fractures seen.   Original Report Authenticated By: Tonia Ghent, M.D.    Dg Hip Complete Left  02/22/2013  *RADIOLOGY REPORT*  Clinical Data:  Status post fall; left hip pain.  LEFT HIP - COMPLETE 2+ VIEW  Comparison: None.  Findings: There appears to be a mildly displaced basicervical fracture through the left femoral neck; the fracture line is difficult to fully characterize, but there is abnormal alignment of the proximal left femur.  The right hip joint is grossly unremarkable in appearance.  Mild degenerative change is noted at the lower lumbar spine.  The sacroiliac joints are grossly unremarkable in appearance.  Diffuse vascular calcifications are seen.  The visualized bowel gas pattern is grossly unremarkable.  IMPRESSION:  1.  Mildly displaced basicervical fracture through the left femoral neck. 2.  Diffuse vascular calcifications seen.   Original Report Authenticated By: Tonia Ghent, M.D.    Ct Head Wo Contrast  02/22/2013  *RADIOLOGY REPORT*  Clinical Data: Fall with hip pain.  CT HEAD WITHOUT CONTRAST  Technique:  Contiguous axial images were obtained from the base of the skull through the vertex without contrast.  Comparison: 09/27/2012  Findings: Bone windows demonstrate mucosal thickening of the left maxillary sinus. Clear mastoid air cells.  Soft tissue windows demonstrate expected cerebral atrophy.  Mild low density in the periventricular white matter likely related to small vessel disease.Posterior left frontal lobe nonacute infarct, including on image 22/series 2. No  mass lesion, hemorrhage, hydrocephalus, acute infarct, intra-axial, or extra-axial fluid collection.  IMPRESSION:  1. No acute intracranial abnormality. 2.  Cerebral atrophy with remote left posterior frontal lobe infarct.   Original Report Authenticated By: Jeronimo Greaves, M.D.     Scheduled Meds: . amLODipine  5 mg Oral q morning - 10a  . aspirin EC  81 mg Oral Daily  . atorvastatin  20 mg Oral q1800  . chlorhexidine  60 mL Topical Once  . DULoxetine  30 mg Oral QHS  . insulin aspart  0-15 Units Subcutaneous Q4H  . metoprolol succinate  75 mg Oral BID  .  nystatin cream  1 application Topical QPM  . phytonadione (VITAMIN K) IV  0.5 mg Intravenous Once  . sodium chloride  3 mL Intravenous Q12H  . sodium chloride  3 mL Intravenous Q12H  . sodium polystyrene  15 g Oral Once   Continuous Infusions:   Principal Problem:   Hip fracture, left Active Problems:   Diabetes mellitus   A-fib   S/P CABG x 1   Aortic valve replaced   DNR (do not resuscitate)    Time spent: 40 minutes   [facility]  [facility] Pager [phone]. If 8PM-8AM, please contact night-coverage at [email or web address], password [facility] 02/24/2013, 10:40 AM  LOS: 2 days

## 2013-02-24 NOTE — Progress Notes (Signed)
Patient has refused his insulin x2, the need for the insulin was explained to the patient, he verbalized an understanding of what he is doing in refusing the insulin and states that he has been managing his blood sugars since 1971 and knows better than the ordering physician his blood sugar regiment.

## 2013-02-25 ENCOUNTER — Inpatient Hospital Stay (HOSPITAL_COMMUNITY): Payer: Medicare Other | Admitting: Anesthesiology

## 2013-02-25 ENCOUNTER — Encounter (HOSPITAL_COMMUNITY): Payer: Self-pay | Admitting: Anesthesiology

## 2013-02-25 ENCOUNTER — Encounter (HOSPITAL_COMMUNITY)
Admission: EM | Disposition: A | Discharge status: Nursing Home (Includes ICF) | Payer: Self-pay | Source: Home / Self Care | Attending: Internal Medicine

## 2013-02-25 ENCOUNTER — Inpatient Hospital Stay (HOSPITAL_COMMUNITY): Payer: Medicare Other

## 2013-02-25 HISTORY — PX: HIP ARTHROPLASTY: SHX981

## 2013-02-25 LAB — GLUCOSE, CAPILLARY
Glucose-Capillary: 224 mg/dL — ABNORMAL HIGH (ref 70–99)
Glucose-Capillary: 323 mg/dL — ABNORMAL HIGH (ref 70–99)
Glucose-Capillary: 294 mg/dL — ABNORMAL HIGH (ref 70–99)
Glucose-Capillary: 304 mg/dL — ABNORMAL HIGH (ref 70–99)
Glucose-Capillary: 383 mg/dL — ABNORMAL HIGH (ref 70–99)

## 2013-02-25 LAB — CBC
HCT: 30.3 % — ABNORMAL LOW (ref 39.0–52.0)
MCHC: 32.7 g/dL (ref 30.0–36.0)
RDW: 16.9 % — ABNORMAL HIGH (ref 11.5–15.5)

## 2013-02-25 LAB — COMPREHENSIVE METABOLIC PANEL
ALT: 13 U/L (ref 0–53)
AST: 17 U/L (ref 0–37)
Albumin: 2.9 g/dL — ABNORMAL LOW (ref 3.5–5.2)
CO2: 18 mEq/L — ABNORMAL LOW (ref 19–32)
Calcium: 8.6 mg/dL (ref 8.4–10.5)
Sodium: 133 mEq/L — ABNORMAL LOW (ref 135–145)
Total Protein: 6.5 g/dL (ref 6.0–8.3)

## 2013-02-25 LAB — PROTIME-INR: INR: 1.41 (ref 0.00–1.49)

## 2013-02-25 SURGERY — HEMIARTHROPLASTY, HIP, DIRECT ANTERIOR APPROACH, FOR FRACTURE
Anesthesia: General | Site: Hip | Laterality: Left | Wound class: Clean

## 2013-02-25 MED ORDER — ONDANSETRON HCL 4 MG/2ML IJ SOLN: 4.0000 mg | Freq: Four times a day (QID) | INTRAMUSCULAR | Status: DC | PRN

## 2013-02-25 MED ORDER — WARFARIN SODIUM 6 MG PO TABS
6.0000 mg | ORAL_TABLET | Freq: Once | ORAL | Status: AC
  Administered 2013-02-26: 6 mg via ORAL
  Filled 2013-02-25: qty 1

## 2013-02-25 MED ORDER — CLINDAMYCIN PHOSPHATE 600 MG/50ML IV SOLN
600.0000 mg | Freq: Once | INTRAVENOUS | Status: DC
  Administered 2013-02-25: 600 mg via INTRAVENOUS

## 2013-02-25 MED ORDER — SODIUM CHLORIDE 0.9 % IR SOLN
Status: DC | PRN
  Administered 2013-02-25: 3000 mL

## 2013-02-25 MED ORDER — ALUM & MAG HYDROXIDE-SIMETH 200-200-20 MG/5ML PO SUSP: 30.0000 mL | ORAL | Status: DC | PRN

## 2013-02-25 MED ORDER — SUCCINYLCHOLINE CHLORIDE 20 MG/ML IJ SOLN
INTRAMUSCULAR | Status: DC | PRN
  Administered 2013-02-25: 110 mg via INTRAVENOUS

## 2013-02-25 MED ORDER — FENTANYL CITRATE 0.05 MG/ML IJ SOLN
INTRAMUSCULAR | Status: DC | PRN
  Administered 2013-02-25 (×4): 50 ug via INTRAVENOUS

## 2013-02-25 MED ORDER — PHENYLEPHRINE HCL 10 MG/ML IJ SOLN
10.0000 mg | INTRAVENOUS | Status: DC | PRN
  Administered 2013-02-25: 50 ug/min via INTRAVENOUS

## 2013-02-25 MED ORDER — DOCUSATE SODIUM 100 MG PO CAPS
100.0000 mg | ORAL_CAPSULE | Freq: Two times a day (BID) | ORAL | Status: DC
  Administered 2013-02-26 – 2013-03-02 (×9): 100 mg via ORAL
  Filled 2013-02-25 (×11): qty 1

## 2013-02-25 MED ORDER — ONDANSETRON HCL 4 MG/2ML IJ SOLN
INTRAMUSCULAR | Status: DC | PRN
  Administered 2013-02-25: 4 mg via INTRAVENOUS

## 2013-02-25 MED ORDER — PHENOL 1.4 % MT LIQD
1.0000 | OROMUCOSAL | Status: DC | PRN
  Filled 2013-02-25: qty 177

## 2013-02-25 MED ORDER — POLYETHYLENE GLYCOL 3350 17 G PO PACK
17.0000 g | PACK | Freq: Every day | ORAL | Status: DC | PRN
  Filled 2013-02-25: qty 1

## 2013-02-25 MED ORDER — HYDROCODONE-ACETAMINOPHEN 5-325 MG PO TABS: 1.0000 | ORAL_TABLET | Freq: Four times a day (QID) | ORAL | Status: AC | PRN

## 2013-02-25 MED ORDER — LACTATED RINGERS IV SOLN: INTRAVENOUS | Status: DC

## 2013-02-25 MED ORDER — OXYCODONE HCL 5 MG/5ML PO SOLN: 5.0000 mg | Freq: Once | ORAL | Status: DC | PRN

## 2013-02-25 MED ORDER — OXYCODONE HCL 5 MG PO TABS: 5.0000 mg | ORAL_TABLET | Freq: Once | ORAL | Status: DC | PRN

## 2013-02-25 MED ORDER — CLINDAMYCIN PHOSPHATE 600 MG/50ML IV SOLN
600.0000 mg | Freq: Four times a day (QID) | INTRAVENOUS | Status: AC
  Administered 2013-02-25 – 2013-02-26 (×2): 600 mg via INTRAVENOUS
  Filled 2013-02-25 (×2): qty 50

## 2013-02-25 MED ORDER — ALBUMIN HUMAN 5 % IV SOLN
INTRAVENOUS | Status: DC | PRN
  Administered 2013-02-25 (×2): via INTRAVENOUS

## 2013-02-25 MED ORDER — LIDOCAINE HCL 4 % MT SOLN
OROMUCOSAL | Status: DC | PRN
  Administered 2013-02-25: 4 mL via TOPICAL

## 2013-02-25 MED ORDER — MENTHOL 3 MG MT LOZG
1.0000 | LOZENGE | OROMUCOSAL | Status: DC | PRN
  Filled 2013-02-25: qty 9

## 2013-02-25 MED ORDER — FENTANYL CITRATE 0.05 MG/ML IJ SOLN: 25.0000 ug | INTRAMUSCULAR | Status: DC | PRN

## 2013-02-25 MED ORDER — WARFARIN - PHARMACIST DOSING INPATIENT
Freq: Every day | Status: DC
  Administered 2013-02-26: 18:00:00

## 2013-02-25 MED ORDER — ONDANSETRON HCL 4 MG PO TABS: 4.0000 mg | ORAL_TABLET | Freq: Four times a day (QID) | ORAL | Status: DC | PRN

## 2013-02-25 MED ORDER — PROPOFOL 10 MG/ML IV BOLUS
INTRAVENOUS | Status: DC | PRN
  Administered 2013-02-25: 75 mg via INTRAVENOUS

## 2013-02-25 MED ORDER — TRAZODONE 25 MG HALF TABLET
25.0000 mg | ORAL_TABLET | Freq: Every day | ORAL | Status: DC
Start: 2013-02-25 — End: 2013-03-02
  Administered 2013-02-26 – 2013-02-28 (×3): 25 mg via ORAL
  Filled 2013-02-25 (×6): qty 1

## 2013-02-25 MED ORDER — METOCLOPRAMIDE HCL 5 MG/ML IJ SOLN
5.0000 mg | Freq: Three times a day (TID) | INTRAMUSCULAR | Status: DC | PRN
  Administered 2013-02-25: 5 mg via INTRAVENOUS
  Filled 2013-02-25 (×2): qty 2

## 2013-02-25 MED ORDER — CLINDAMYCIN PHOSPHATE 600 MG/50ML IV SOLN
INTRAVENOUS | Status: AC
  Filled 2013-02-25: qty 50

## 2013-02-25 MED ORDER — LIDOCAINE HCL (CARDIAC) 20 MG/ML IV SOLN
INTRAVENOUS | Status: DC | PRN
  Administered 2013-02-25: 80 mg via INTRAVENOUS

## 2013-02-25 MED ORDER — ACETAMINOPHEN 325 MG PO TABS: 650.0000 mg | ORAL_TABLET | Freq: Four times a day (QID) | ORAL | Status: DC | PRN

## 2013-02-25 MED ORDER — METOCLOPRAMIDE HCL 5 MG PO TABS
5.0000 mg | ORAL_TABLET | Freq: Three times a day (TID) | ORAL | Status: DC | PRN
  Filled 2013-02-25: qty 2

## 2013-02-25 MED ORDER — SODIUM CHLORIDE 0.9 % IV SOLN
INTRAVENOUS | Status: DC
  Administered 2013-02-25 – 2013-02-26 (×2): via INTRAVENOUS
  Administered 2013-02-26: 100 mL/h via INTRAVENOUS
  Administered 2013-02-26 – 2013-02-27 (×3): via INTRAVENOUS

## 2013-02-25 MED ORDER — BISACODYL 5 MG PO TBEC: 5.0000 mg | DELAYED_RELEASE_TABLET | Freq: Every day | ORAL | Status: DC | PRN

## 2013-02-25 MED ORDER — HYDROMORPHONE HCL PF 1 MG/ML IJ SOLN
0.1000 mg | INTRAMUSCULAR | Status: DC | PRN
  Administered 2013-02-26: 0.1 mg via INTRAVENOUS
  Filled 2013-02-25: qty 1

## 2013-02-25 MED ORDER — FLEET ENEMA 7-19 GM/118ML RE ENEM
1.0000 | ENEMA | Freq: Once | RECTAL | Status: AC | PRN
  Filled 2013-02-25: qty 1

## 2013-02-25 MED ORDER — ARTIFICIAL TEARS OP OINT
TOPICAL_OINTMENT | OPHTHALMIC | Status: DC | PRN
  Administered 2013-02-25: 1 via OPHTHALMIC

## 2013-02-25 MED ORDER — ACETAMINOPHEN 650 MG RE SUPP: 650.0000 mg | Freq: Four times a day (QID) | RECTAL | Status: DC | PRN

## 2013-02-25 MED ORDER — WARFARIN SODIUM 5 MG PO TABS: 5.0000 mg | ORAL_TABLET | Freq: Once | ORAL | Status: DC

## 2013-02-25 MED ORDER — HYDROCODONE-ACETAMINOPHEN 5-325 MG PO TABS
1.0000 | ORAL_TABLET | Freq: Four times a day (QID) | ORAL | Status: DC | PRN
  Filled 2013-02-25: qty 1

## 2013-02-25 MED ORDER — LACTATED RINGERS IV SOLN
INTRAVENOUS | Status: DC | PRN
  Administered 2013-02-25 (×2): via INTRAVENOUS

## 2013-02-25 MED ORDER — PHENYLEPHRINE HCL 10 MG/ML IJ SOLN
INTRAMUSCULAR | Status: DC | PRN
  Administered 2013-02-25 (×2): 80 ug via INTRAVENOUS

## 2013-02-25 MED ORDER — OXYCODONE HCL 5 MG PO TABS
5.0000 mg | ORAL_TABLET | ORAL | Status: DC | PRN
  Administered 2013-02-26: 10 mg via ORAL
  Administered 2013-02-26: 5 mg via ORAL
  Administered 2013-02-26 – 2013-02-27 (×2): 10 mg via ORAL
  Administered 2013-02-27: 5 mg via ORAL
  Administered 2013-02-27: 10 mg via ORAL
  Filled 2013-02-25 (×3): qty 2
  Filled 2013-02-25 (×2): qty 1
  Filled 2013-02-25 (×2): qty 2
  Filled 2013-02-25: qty 1
  Filled 2013-02-25: qty 2

## 2013-02-25 SURGICAL SUPPLY — 64 items
BLADE SAW SAG 73X25 THK (BLADE) ×1
BLADE SAW SGTL 73X25 THK (BLADE) ×1 IMPLANT
BLADE SURG ROTATE 9660 (MISCELLANEOUS) IMPLANT
BRUSH FEMORAL CANAL (MISCELLANEOUS) ×2 IMPLANT
CEMENT BONE DEPUY (Cement) ×4 IMPLANT
CEMENT RESTRICTOR DEPUY SZ 4 (Cement) ×2 IMPLANT
CHLORAPREP W/TINT 26ML (MISCELLANEOUS) ×2 IMPLANT
CLOTH BEACON ORANGE TIMEOUT ST (SAFETY) ×2 IMPLANT
COVER SURGICAL LIGHT HANDLE (MISCELLANEOUS) ×2 IMPLANT
DRAPE INCISE IOBAN 66X45 STRL (DRAPES) ×2 IMPLANT
DRAPE ORTHO SPLIT 77X108 STRL (DRAPES) ×4
DRAPE PROXIMA HALF (DRAPES) ×2 IMPLANT
DRAPE SURG ORHT 6 SPLT 77X108 (DRAPES) ×2 IMPLANT
DRAPE U-SHAPE 47X51 STRL (DRAPES) ×2 IMPLANT
DRILL BIT 7/64X5 (BIT) ×2 IMPLANT
DRSG ADAPTIC 3X8 NADH LF (GAUZE/BANDAGES/DRESSINGS) ×2 IMPLANT
DRSG PAD ABDOMINAL 8X10 ST (GAUZE/BANDAGES/DRESSINGS) ×2 IMPLANT
ELECT BLADE 6.5 EXT (BLADE) IMPLANT
ELECT REM PT RETURN 9FT ADLT (ELECTROSURGICAL) ×2
ELECTRODE REM PT RTRN 9FT ADLT (ELECTROSURGICAL) ×1 IMPLANT
EVACUATOR 1/8 PVC DRAIN (DRAIN) ×2 IMPLANT
GLOVE BIO SURGEON STRL SZ7 (GLOVE) ×2 IMPLANT
GLOVE BIO SURGEON STRL SZ7.5 (GLOVE) ×2 IMPLANT
GLOVE BIOGEL PI IND STRL 8 (GLOVE) ×1 IMPLANT
GLOVE BIOGEL PI INDICATOR 8 (GLOVE) ×1
GLOVE BIOGEL PI ORTHO PRO SZ7 (GLOVE) ×1
GLOVE PI ORTHO PRO STRL SZ7 (GLOVE) ×1 IMPLANT
GLOVE SS BIOGEL STRL SZ 8 (GLOVE) ×1 IMPLANT
GLOVE SUPERSENSE BIOGEL SZ 8 (GLOVE) ×1
GLOVE SURG SS PI 7.0 STRL IVOR (GLOVE) ×6 IMPLANT
GOWN PREVENTION PLUS LG XLONG (DISPOSABLE) ×2 IMPLANT
GOWN STRL NON-REIN LRG LVL3 (GOWN DISPOSABLE) ×4 IMPLANT
HANDPIECE INTERPULSE COAX TIP (DISPOSABLE) ×2
HOOD PEEL AWAY FACE SHEILD DIS (HOOD) ×4 IMPLANT
IMMOBILIZER KNEE 20 (SOFTGOODS) ×2
IMMOBILIZER KNEE 20 THIGH 36 (SOFTGOODS) ×1 IMPLANT
IMMOBILIZER KNEE 22 UNIV (SOFTGOODS) IMPLANT
IMMOBILIZER KNEE 24 THIGH 36 (MISCELLANEOUS) IMPLANT
IMMOBILIZER KNEE 24 UNIV (MISCELLANEOUS)
KIT BASIN OR (CUSTOM PROCEDURE TRAY) ×2 IMPLANT
KIT ROOM TURNOVER OR (KITS) ×2 IMPLANT
MANIFOLD NEPTUNE II (INSTRUMENTS) ×2 IMPLANT
NEEDLE MAYO TROCAR (NEEDLE) ×2 IMPLANT
NS IRRIG 1000ML POUR BTL (IV SOLUTION) ×2 IMPLANT
PACK TOTAL JOINT (CUSTOM PROCEDURE TRAY) ×2 IMPLANT
PAD ARMBOARD 7.5X6 YLW CONV (MISCELLANEOUS) ×4 IMPLANT
PASSER SUT SWANSON 36MM LOOP (INSTRUMENTS) ×2 IMPLANT
PRESSURIZER FEMORAL UNIV (MISCELLANEOUS) ×2 IMPLANT
SET HNDPC FAN SPRY TIP SCT (DISPOSABLE) ×1 IMPLANT
SLEEVE CABLE 2MM VT (Orthopedic Implant) ×4 IMPLANT
SPONGE GAUZE 4X4 12PLY (GAUZE/BANDAGES/DRESSINGS) ×2 IMPLANT
STAPLER VISISTAT 35W (STAPLE) ×2 IMPLANT
SUCTION FRAZIER TIP 10 FR DISP (SUCTIONS) ×2 IMPLANT
SUT ETHIBOND NAB CT1 #1 30IN (SUTURE) ×8 IMPLANT
SUT VIC AB 0 CT1 27 (SUTURE) ×2
SUT VIC AB 0 CT1 27XBRD ANBCTR (SUTURE) ×1 IMPLANT
SUT VIC AB 1 CTB1 27 (SUTURE) ×6 IMPLANT
SUT VIC AB 2-0 CT1 27 (SUTURE) ×2
SUT VIC AB 2-0 CT1 TAPERPNT 27 (SUTURE) ×1 IMPLANT
TOWEL OR 17X24 6PK STRL BLUE (TOWEL DISPOSABLE) ×2 IMPLANT
TOWEL OR 17X26 10 PK STRL BLUE (TOWEL DISPOSABLE) ×2 IMPLANT
TOWER CARTRIDGE SMART MIX (DISPOSABLE) ×2 IMPLANT
TRAY FOLEY CATH 14FR (SET/KITS/TRAYS/PACK) ×2 IMPLANT
WATER STERILE IRR 1000ML POUR (IV SOLUTION) ×2 IMPLANT

## 2013-02-25 NOTE — Op Note (Signed)
Procedure(s): ARTHROPLASTY BIPOLAR HIP Procedure Note  Aaron [name] male 79 y.o. 02/25/2013  Procedure(s) and Anesthesia Type:  #1 left hip hemiarthroplasty #2 ORIF left hip greater trochanter fracture  Surgeon(s) and Role:    * Mable Paris, MD - Primary   Indications:  79 y.o. male s/p fall with left hip fracture. Indicated for surgery to promote early ambulation, pain control and prevent complications of bed rest.     Surgeon: Mable Paris   Assistants: Damita Lack PA-C (Danielle was present and scrubbed throughout the procedure and was essential in positioning, retraction, exposure, and closure)  Anesthesia: General endotracheal anesthesia      Procedure Detail  ARTHROPLASTY BIPOLAR HIP  Findings: Upon exposure he was noted to have a complete fracture of the greater trochanter. Therefore Dall-Miles cables were used around the implant with one above and one below the lesser trochanter. A cemented size 3 Summit basic stem was used with a size 50 head and -3 neck length. Excellent stability was noted.  Estimated Blood Loss:  200 mL         Drains: none  Blood Given: none          Specimens: none        Complications:  * No complications entered in OR log *         Disposition: PACU - hemodynamically stable.         Condition: stable    Procedure:  The patient was identified in the preoperative  holding area where I personally marked the operative site after  verifying site side and procedure with the patient. He was taken back  to the operating room where general anesthesia was induced without  Complication. The patient was placed in lateral decubitus position with the  left side up. The left lower extremity was then prepped and draped in standard sterile fashion. The patient did receive IV antibiotics prior to the  incision.   After the appropriate time-out, an approximately 12-cm  incision was made over the posterior third of  the greater trochanter.  Dissection was carried down to the fascia which was split longitudinally  in line with the incision. The piriformis was tagged and the external  rotators were then taken down off the posterior greater trochanter in 1  sheath with the posterior capsule. It was noted at this point to the greater trochanter was also fractured. The fracture in the femoral neck went very distal. There was significant comminution. Femoral head was removed. Posterior  capsule and external rotators were split just below the piriformis down  to the level of the acetabulum. Great care was taken to protect the  sciatic nerve.  The fracture fragments were removed. The intramedullary canal was entered and broach.  the the cables were placed around the fracture and implant with one above and one below the lesser trochanter. They were provisionally tightened. The size 50 head trial was placed in the joint was reduced. It was felt to be appropriate in size and length. Soft tissue tension was appropriate. The hip was dislocated carefully. The cement restrictor was placed and the canal was cleaned and dried. The final implant a size 3 Summit basic was cemented in place and held until the cement hardened. The size 50 head was again trialed with a 0 neck length which was felt to be a bit tight. Therefore the final implant was placed with a -3 neck. This had appropriate soft tissue tension and lengths were appropriate. This provided excellent  stability. The cables were final tightened and crimped and cut. The joint was then copiously irrigated with  normal saline with pulse lavage and then the external rotators were  repaired using Ethibond sutures through the greater trochanter and tied  over a bone bridge. The deep fascia was then closed using #1 Vicryl in  running fashion proximally and distally. The skin was then closed using  2-0 Vicryl in deep dermal layer and staples for skin closure. Sterile  dressings were  then applied including light sterile dressing. The patient was  then placed in a knee immobilizer, rolled into supine position and  extubated. He was then transferred to the PACU in stable condition.    POSTOPERATIVE PLAN: We will keep him nonweightbearing and will not use his prosthetic limb until this is healed to prevent excess stress on the repair. He will be transitioned back to Coumadin starting tonight and can use heparin as a bridge starting in the morning with no bolus.

## 2013-02-25 NOTE — Progress Notes (Signed)
Patient to OR via bed with family.

## 2013-02-25 NOTE — Anesthesia Postprocedure Evaluation (Signed)
  Anesthesia Post-op Note  Patient: Aaron [name]  Procedure(s) Performed: Procedure(s): ARTHROPLASTY BIPOLAR HIP (Left)  Patient Location: PACU  Anesthesia Type:General  Level of Consciousness: confused and lethargic  Airway and Oxygen Therapy: Patient Spontanous Breathing and Patient connected to nasal cannula oxygen  Post-op Pain: none  Post-op Assessment: Post-op Vital signs reviewed, Patient's Cardiovascular Status Stable and Respiratory Function Stable  Post-op Vital Signs: stable  Complications: No apparent anesthesia complications

## 2013-02-25 NOTE — Progress Notes (Signed)
[facility] PROGRESS NOTE  Aaron [name] WUJ:[id number] DOB: [birth year] DOA: 02/22/2013 PCP: Danella Penton., MD  Assessment/Plan: Principal Problem:   Hip fracture, left Active Problems:   Diabetes mellitus   A-fib   S/P CABG x 1   Aortic valve replaced   DNR (do not resuscitate)    1. Hip fracture-on anticoagulation, will allow the INR to drift down, received 0.5 mg off vitamin K, bridge with heparin instead of Lovenox due to easy reversibility, anticipate surgery today or tomorrow 2. Atrial fibrillation /aortic valve replacement with a porcine valveon Coumadin, start heparin drip when INR less than 2 without a bolus 3. Prior history of CVA in the setting of subtherapeutic INR 4. Diabetes mellitus continue sliding scale insulin, CBG elevated 5. Acute on chronic kidney injury creatinine is gone up from 1.69-2.0 to baseline of about 1.05 we'll start the patient on IV hydration, discontinue Toradol 6. Hyperkalemia, dehydration recheck labs today    Code Status: DO NOT RESUSCITATE  Family Communication: family updated about patient's clinical progress  Disposition Plan: As above    Brief narrative:  Aaron [name] is an 79 y.o. 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 His son in law was there and witnessed this mechanical fall. Evaluation in emergency room included a head CT which showed no acute process. At the time of his hip showed left femoral neck fracture with mild displacement. His INR is 2.23, white count 19.7 thousand, hemoglobin of 11.7. His creatinine is 1.7 with BUN of 61. Orthopedics was consulted by DEP and  has seen the patient. Hospitalist was asked to admit him for hip fracture  Consultants:  Mable Paris, MD  Procedures:  none Antibiotics:  none   Reports lying comfortably. No events overnight. Eating this am without problems. No complaints or concerns   Objective: Filed Vitals:   02/24/13 0420 02/24/13 1421 02/24/13 2016 02/25/13 0357  BP: 104/54 135/74 155/70 137/67  Pulse: 66 83 69 76  Temp: 100.1 F (37.8 C) 97.7 F (36.5 C) 98.2 F (36.8 C) 100.4 F (38 C)  TempSrc: Oral Oral Oral Oral  Resp: 18 16 18 20   Height:      Weight:      SpO2: 91% 96% 92% 93%    Intake/Output Summary (Last 24 hours) at 02/25/13 1158 Last data filed at 02/25/13 0358  Gross per 24 hour  Intake      0 ml  Output    100 ml  Net   -100 ml    Exam:  HENT:  Head: Atraumatic.  Nose: Nose normal.  Mouth/Throat: Oropharynx is clear and moist.  Eyes: Conjunctivae are normal. Pupils are equal, round, and reactive to light. No scleral icterus.  Neck: Neck supple. No tracheal deviation present.  Cardiovascular: Normal rate, regular rhythm, normal heart sounds and intact distal pulses.  Pulmonary/Chest: Effort normal and breath sounds normal. No respiratory distress.  Abdominal: Soft. Normal appearance and bowel sounds are normal. She exhibits no distension. There is no tenderness.  Musculoskeletal: She exhibits no edema and no tenderness.  Neurological: She is alert. No cranial nerve deficit.    Data Reviewed: Basic Metabolic Panel:  Recent Labs Lab 02/22/13 2203 02/24/13 0625  NA  138 135  K 4.4 5.2*  CL 100 98  CO2 23 23  GLUCOSE 145* 268*  BUN 61* 65*  CREATININE 1.69* 2.02*  CALCIUM 9.3 8.7    Liver Function Tests:  Recent Labs Lab 02/22/13 2203  AST 24  ALT 20  ALKPHOS 66  BILITOT 0.4  PROT 7.4  ALBUMIN 3.6   No results found for this basename: LIPASE, AMYLASE,  in the last 168 hours No results found for this basename: AMMONIA,  in the last 168  hours  CBC:  Recent Labs Lab 02/22/13 2203 02/24/13 0625 02/25/13 0900  WBC 19.7* 17.4* 16.3*  NEUTROABS 17.8*  --   --   HGB 11.7* 10.0* 9.9*  HCT 36.0* 30.5* 30.3*  MCV 79.8 79.0 79.3  PLT 260 210 205    Cardiac Enzymes:  Recent Labs Lab 02/22/13 2203  TROPONINI <0.30   BNP (last 3 results)  Recent Labs  09/30/12 1233  PROBNP 29199.0*     CBG:  Recent Labs Lab 02/24/13 2013 02/25/13 0008 02/25/13 0357 02/25/13 0823 02/25/13 1118  GLUCAP 267* 164* 116* 224* 323*    Recent Results (from the past 240 hour(s))  SURGICAL PCR SCREEN     Status: None   Collection Time    02/23/13  5:15 AM      Result Value Range Status   MRSA, PCR NEGATIVE  NEGATIVE Final   Staphylococcus aureus NEGATIVE  NEGATIVE Final   Comment:            The Xpert SA Assay (FDA     approved for NASAL specimens     in patients over 15 years of age),     is one component of     a comprehensive surveillance     program.  Test performance has     been validated by [employer] for patients greater     than or equal to 58 year old.     It is not intended     to diagnose infection nor to     guide or monitor treatment.     Studies: Dg Chest 1 View  02/22/2013  *RADIOLOGY REPORT*  Clinical Data: Status post fall; concern for chest injury.  CHEST - 1 VIEW  Comparison: Chest radiograph performed 10/02/2012, and CT of the thoracic spine performed 10/02/2012  Findings: The lungs are well-aerated.  Mild vascular congestion is noted, without significant pulmonary edema.  There is no evidence of focal opacification, pleural effusion or pneumothorax.  The cardiomediastinal silhouette is borderline normal in size.  An aortic valve replacement is noted.  The patient is status post median sternotomy, with evidence of prior CABG.  No acute osseous abnormalities are seen.  IMPRESSION: Mild vascular congestion noted; lungs remain grossly clear.  No displaced rib fractures seen.   Original Report  Authenticated By: Tonia Ghent, M.D.    Dg Hip Complete Left  02/22/2013  *RADIOLOGY REPORT*  Clinical Data: Status post fall; left hip pain.  LEFT HIP - COMPLETE 2+ VIEW  Comparison: None.  Findings: There appears to be a mildly displaced basicervical fracture through the left femoral neck; the fracture line is difficult to fully characterize, but there is abnormal alignment of the proximal left femur.  The right hip joint is grossly unremarkable in appearance.  Mild degenerative change is noted at the lower lumbar spine.  The sacroiliac joints are grossly unremarkable in appearance.  Diffuse vascular calcifications are seen.  The visualized bowel gas  pattern is grossly unremarkable.  IMPRESSION:  1.  Mildly displaced basicervical fracture through the left femoral neck. 2.  Diffuse vascular calcifications seen.   Original Report Authenticated By: Tonia Ghent, M.D.    Ct Head Wo Contrast  02/22/2013  *RADIOLOGY REPORT*  Clinical Data: Fall with hip pain.  CT HEAD WITHOUT CONTRAST  Technique:  Contiguous axial images were obtained from the base of the skull through the vertex without contrast.  Comparison: 09/27/2012  Findings: Bone windows demonstrate mucosal thickening of the left maxillary sinus. Clear mastoid air cells.  Soft tissue windows demonstrate expected cerebral atrophy.  Mild low density in the periventricular white matter likely related to small vessel disease.Posterior left frontal lobe nonacute infarct, including on image 22/series 2. No  mass lesion, hemorrhage, hydrocephalus, acute infarct, intra-axial, or extra-axial fluid collection.  IMPRESSION:  1. No acute intracranial abnormality. 2.  Cerebral atrophy with remote left posterior frontal lobe infarct.   Original Report Authenticated By: Jeronimo Greaves, M.D.     Scheduled Meds: . amLODipine  5 mg Oral q morning - 10a  . aspirin EC  81 mg Oral Daily  . atorvastatin  20 mg Oral q1800  . chlorhexidine  60 mL Topical Once  . DULoxetine   30 mg Oral QHS  . insulin aspart  0-15 Units Subcutaneous Q4H  . metoprolol succinate  75 mg Oral BID  . nystatin cream  1 application Topical QPM  . sodium chloride  3 mL Intravenous Q12H  . sodium chloride  3 mL Intravenous Q12H   Continuous Infusions:   Principal Problem:   Hip fracture, left Active Problems:   Diabetes mellitus   A-fib   S/P CABG x 1   Aortic valve replaced   DNR (do not resuscitate)    Time spent: 40 minutes   [facility]  [facility] Pager [phone]. If 8PM-8AM, please contact night-coverage at [email or web address], password [facility] 02/25/2013, 11:58 AM  LOS: 3 days

## 2013-02-25 NOTE — Anesthesia Procedure Notes (Signed)
Procedure Name: Intubation Date/Time: 02/25/2013 3:15 PM Performed by: Gayla Medicus Pre-anesthesia Checklist: Patient identified, Timeout performed, Emergency Drugs available, Suction available and Patient being monitored Patient Re-evaluated:Patient Re-evaluated prior to inductionOxygen Delivery Method: Circle system utilized Preoxygenation: Pre-oxygenation with 100% oxygen Intubation Type: IV induction Ventilation: Mask ventilation without difficulty Laryngoscope Size: Mac and 4 Grade View: Grade II Tube type: Oral Tube size: 7.0 mm Number of attempts: 1 Airway Equipment and Method: Stylet and LTA kit utilized Placement Confirmation: ETT inserted through vocal cords under direct vision,  positive ETCO2 and breath sounds checked- equal and bilateral Secured at: 22 cm Tube secured with: Tape Dental Injury: Teeth and Oropharynx as per pre-operative assessment

## 2013-02-25 NOTE — Progress Notes (Signed)
Inpatient Diabetes Program Recommendations  AACE/ADA: New Consensus Statement on Inpatient Glycemic Control (2013)  Target Ranges:  Prepandial:   less than 140 mg/dL      Peak postprandial:   less than 180 mg/dL (1-2 hours)      Critically ill patients:  140 - 180 mg/dL  Results for MILLEDGE, GERDING (MRN [id number]) as of 02/25/2013 17:38  Ref. Range 02/25/2013 08:23 02/25/2013 11:18 02/25/2013 13:58 02/25/2013 14:59  Glucose-Capillary Latest Range: 70-99 mg/dL 629 (H) 528 (H) 413 (H) 297 (H)   Inpatient Diabetes Program Recommendations Insulin - Basal: Start a portion of patient's home dose Lantus  Thank you  Piedad Climes BSN, RN,CDE Inpatient Diabetes Coordinator [phone] (team pager)

## 2013-02-25 NOTE — Progress Notes (Signed)
PATIENT ID: Aaron [name]   Day of Surgery Procedure(s) (LRB): ARTHROPLASTY BIPOLAR HIP (Left)  Subjective: no new concerns.  He was given a small dose of vitamin K yesterday.  His INR is trending down.  Objective:  Filed Vitals:   02/25/13 0357  BP: 137/67  Pulse: 76  Temp: 100.4 F (38 C)  Resp: 20    Lying comfortably in bed. Left hip mildly swollen and tender.  Labs:   Recent Labs  02/22/13 2203 02/24/13 0625  HGB 11.7* 10.0*   Recent Labs  02/22/13 2203 02/24/13 0625  WBC 19.7* 17.4*  RBC 4.51 3.86*  HCT 36.0* 30.5*  PLT 260 210   Recent Labs  02/22/13 2203 02/24/13 0625  NA 138 135  K 4.4 5.2*  CL 100 98  CO2 23 23  BUN 61* 65*  CREATININE 1.69* 2.02*  GLUCOSE 145* 268*  CALCIUM 9.3 8.7    Assessment and Plan:left hip displaced femoral neck fracture Plan left hip hemiarthroplasty when INR in safe zone.  I think if INR comes down below to today we can go ahead with surgery this afternoon.  INR is pending this morning. N.p.o. For now

## 2013-02-25 NOTE — Transfer of Care (Signed)
Immediate Anesthesia Transfer of Care Note  Patient: Aaron [name]  Procedure(s) Performed: Procedure(s): ARTHROPLASTY BIPOLAR HIP (Left)  Patient Location: PACU  Anesthesia Type:General  Level of Consciousness: awake  Airway & Oxygen Therapy: Patient Spontanous Breathing and Patient connected to face mask oxygen  Post-op Assessment: Report given to PACU RN and Post -op Vital signs reviewed and stable  Post vital signs: Reviewed and stable  Complications: No apparent anesthesia complications

## 2013-02-25 NOTE — Anesthesia Preprocedure Evaluation (Signed)
Anesthesia Evaluation  Patient identified by MRN, date of birth, ID band Patient awake    Reviewed: Allergy & Precautions, H&P , NPO status , Patient's Chart, lab work & pertinent test results  Airway Mallampati: II  Neck ROM: full    Dental   Pulmonary          Cardiovascular hypertension, + CAD, + Past MI, + CABG, + Peripheral Vascular Disease and +CHF + dysrhythmias     Neuro/Psych    GI/Hepatic   Endo/Other  diabetes, Type 2  Renal/GU      Musculoskeletal   Abdominal   Peds  Hematology   Anesthesia Other Findings   Reproductive/Obstetrics                           Anesthesia Physical Anesthesia Plan  ASA: III  Anesthesia Plan: General   Post-op Pain Management:    Induction: Intravenous  Airway Management Planned: Oral ETT  Additional Equipment:   Intra-op Plan:   Post-operative Plan: Extubation in OR  Informed Consent: I have reviewed the patients History and Physical, chart, labs and discussed the procedure including the risks, benefits and alternatives for the proposed anesthesia with the patient or authorized representative who has indicated his/her understanding and acceptance.     Plan Discussed with: CRNA and Surgeon  Anesthesia Plan Comments:         Anesthesia Quick Evaluation

## 2013-02-25 NOTE — Progress Notes (Signed)
Utilization Review Completed.[name], Aaron T3/09/2013  

## 2013-02-25 NOTE — Preoperative (Signed)
Beta Blockers   Reason not to administer Beta Blockers:Not Applicable 

## 2013-02-25 NOTE — Progress Notes (Signed)
ANTICOAGULATION CONSULT NOTE - Follow Up Consult  Pharmacy Consult for Coumadin Indication: atrial fibrillation  Allergies  Allergen Reactions  . Metformin And Related Hives  . Penicillins Hives and Swelling    Patient Measurements: Height: 5\' 7"  (170.2 cm) Weight: 150 lb 4.8 oz (68.176 kg) IBW/kg (Calculated) : 66.1  Vital Signs: Temp: 98.1 F (36.7 C) (03/10 1730) Temp src: Oral (03/10 1355) BP: 134/63 mmHg (03/10 1355) Pulse Rate: 70 (03/10 1355)  Labs:  Recent Labs  02/22/13 2203  02/24/13 0625 02/24/13 1611 02/25/13 0900 02/25/13 1203  HGB 11.7*  --  10.0*  --  9.9*  --   HCT 36.0*  --  30.5*  --  30.3*  --   PLT 260  --  210  --  205  --   LABPROT 23.7*  < > 25.5* 23.6* 16.9*  --   INR 2.23*  < > 2.46* 2.21* 1.41  --   CREATININE 1.69*  --  2.02*  --   --  1.65*  TROPONINI <0.30  --   --   --   --   --   < > = values in this interval not displayed.  Estimated Creatinine Clearance: 30 ml/min (by C-G formula based on Cr of 1.65).   Assessment: 35 YOM with Atrial fibrillation and porcine AVR on chronic coumadin, which has been on hold for hip surgery. Now to resume. INR 1.41 this morning. Noted patient received vitamin K 0.5mg  IV on 3/9.  PTA dose 6mg  daily, except 3mg  once/week  Goal of Therapy:  INR 2-3 Monitor platelets by anticoagulation protocol: Yes   Plan:  - Coumadin 6mg  po x 1 - f/u INR in Am  Bayard Hugger, PharmD, BCPS  Clinical Pharmacist  Pager: [phone]   02/25/2013,5:40 PM

## 2013-02-25 NOTE — Progress Notes (Signed)
Restart heparin tomorrow morning , without a bolus  Start coumadin tonight

## 2013-02-26 ENCOUNTER — Inpatient Hospital Stay (HOSPITAL_COMMUNITY): Payer: Medicare Other

## 2013-02-26 DIAGNOSIS — E119 Type 2 diabetes mellitus without complications: Secondary | ICD-10-CM

## 2013-02-26 DIAGNOSIS — I4891 Unspecified atrial fibrillation: Secondary | ICD-10-CM

## 2013-02-26 LAB — CBC
HCT: 23.8 % — ABNORMAL LOW (ref 39.0–52.0)
Hemoglobin: 8.1 g/dL — ABNORMAL LOW (ref 13.0–17.0)
MCHC: 34 g/dL (ref 30.0–36.0)

## 2013-02-26 LAB — GLUCOSE, CAPILLARY: Glucose-Capillary: 220 mg/dL — ABNORMAL HIGH (ref 70–99)

## 2013-02-26 LAB — PROTIME-INR
INR: 1.78 — ABNORMAL HIGH (ref 0.00–1.49)
Prothrombin Time: 20.1 seconds — ABNORMAL HIGH (ref 11.6–15.2)

## 2013-02-26 LAB — BASIC METABOLIC PANEL
CO2: 24 mEq/L (ref 19–32)
Chloride: 102 mEq/L (ref 96–112)
Creatinine, Ser: 2.28 mg/dL — ABNORMAL HIGH (ref 0.50–1.35)
GFR calc Af Amer: 28 mL/min — ABNORMAL LOW (ref >90)
Potassium: 5.1 mEq/L (ref 3.5–5.1)
Sodium: 137 mEq/L (ref 135–145)

## 2013-02-26 MED ORDER — INSULIN GLARGINE 100 UNIT/ML ~~LOC~~ SOLN: 30.0000 [IU] | Freq: Every day | SUBCUTANEOUS | Status: DC

## 2013-02-26 MED ORDER — HEPARIN (PORCINE) IN NACL 100-0.45 UNIT/ML-% IJ SOLN
1150.0000 [IU]/h | INTRAMUSCULAR | Status: DC
  Administered 2013-02-26: 1150 [IU]/h via INTRAVENOUS
  Filled 2013-02-26 (×2): qty 250

## 2013-02-26 MED ORDER — SODIUM CHLORIDE 0.9 % IV BOLUS (SEPSIS)
250.0000 mL | Freq: Once | INTRAVENOUS | Status: AC
  Administered 2013-02-26: 250 mL via INTRAVENOUS

## 2013-02-26 MED ORDER — HEPARIN (PORCINE) IN NACL 100-0.45 UNIT/ML-% IJ SOLN
14.0000 [IU]/kg/h | INTRAMUSCULAR | Status: DC
  Administered 2013-02-26: 14 [IU]/kg/h via INTRAVENOUS
  Filled 2013-02-26: qty 250

## 2013-02-26 MED ORDER — VANCOMYCIN HCL 1000 MG IV SOLR
750.0000 mg | INTRAVENOUS | Status: DC
  Filled 2013-02-26: qty 750

## 2013-02-26 MED ORDER — VANCOMYCIN HCL IN DEXTROSE 1-5 GM/200ML-% IV SOLN
1000.0000 mg | Freq: Once | INTRAVENOUS | Status: DC
  Filled 2013-02-26: qty 200

## 2013-02-26 MED ORDER — INSULIN GLARGINE 100 UNIT/ML ~~LOC~~ SOLN
30.0000 [IU] | Freq: Every day | SUBCUTANEOUS | Status: DC
  Administered 2013-02-27: 30 [IU] via SUBCUTANEOUS

## 2013-02-26 MED ORDER — SODIUM CHLORIDE 0.9 % IV SOLN
250.0000 mg | Freq: Four times a day (QID) | INTRAVENOUS | Status: DC
  Administered 2013-02-27 (×2): 250 mg via INTRAVENOUS
  Filled 2013-02-26 (×4): qty 250

## 2013-02-26 MED ORDER — METOPROLOL SUCCINATE ER 25 MG PO TB24
25.0000 mg | ORAL_TABLET | Freq: Two times a day (BID) | ORAL | Status: DC
  Administered 2013-02-26: 25 mg via ORAL
  Filled 2013-02-26 (×3): qty 1

## 2013-02-26 MED ORDER — WARFARIN SODIUM 6 MG PO TABS
6.0000 mg | ORAL_TABLET | Freq: Once | ORAL | Status: AC
  Administered 2013-02-26: 6 mg via ORAL
  Filled 2013-02-26 (×2): qty 1

## 2013-02-26 NOTE — Progress Notes (Signed)
Pts. Blood sugar has been running high in the >300, K. KirbyNP made aware will continue to cover with sliding scale.

## 2013-02-26 NOTE — Progress Notes (Signed)
Inpatient Diabetes Program Recommendations  AACE/ADA: New Consensus Statement on Inpatient Glycemic Control (2013)  Target Ranges:  Prepandial:   less than 140 mg/dL      Peak postprandial:   less than 180 mg/dL (1-2 hours)      Critically ill patients:  140 - 180 mg/dL  Results for KONRAD, HOAK (MRN [id number]) as of 02/26/2013 14:27  Ref. Range 02/25/2013 17:36 02/25/2013 21:01 02/26/2013 01:47 02/26/2013 05:08 02/26/2013 11:45  Glucose-Capillary Latest Range: 70-99 mg/dL 644 (H) 034 (H) 742 (H) 295 (H) 220 (H)   Inpatient Diabetes Program Recommendations Insulin - Basal: Please start Lantus (home dose 30 units) This Diabetes Coordinator paged Dr. Susie Cassette and received telephone order to start Lantus 30 units. Will follow. Thank you  Piedad Climes BSN, RN,CDE Inpatient Diabetes Coordinator [phone] (team pager)

## 2013-02-26 NOTE — Progress Notes (Signed)
ANTIBIOTIC CONSULT NOTE - INITIAL  Pharmacy Consult for Vanco/Primaxin Indication: pneumonia  Allergies  Allergen Reactions  . Metformin And Related Hives  . Penicillins Hives and Swelling    Patient Measurements: Height: 5\' 7"  (170.2 cm) Weight: 150 lb 4.8 oz (68.176 kg) IBW/kg (Calculated) : 66.1 Adjusted Body Weight:    Vital Signs: Temp: 98.1 F (36.7 C) (03/11 0455) Temp src: Oral (03/11 0455) BP: 96/54 mmHg (03/11 1101) Pulse Rate: 66 (03/11 1101) Intake/Output from previous day: 03/10 0701 - 03/11 0700 In: 2315.8 [I.V.:1815.8; IV Piggyback:500] Out: 700 [Urine:500; Blood:200] Intake/Output from this shift: Total I/O In: 360 [P.O.:360] Out: -   Labs:  Recent Labs  02/24/13 0625 02/25/13 0900 02/25/13 1203 02/26/13 0500  WBC 17.4* 16.3*  --  13.6*  HGB 10.0* 9.9*  --  8.1*  PLT 210 205  --  175  CREATININE 2.02*  --  1.65* 2.28*   Estimated Creatinine Clearance: 21.7 ml/min (by C-G formula based on Cr of 2.28). No results found for this basename: VANCOTROUGH, Leodis Binet, VANCORANDOM, GENTTROUGH, GENTPEAK, GENTRANDOM, TOBRATROUGH, TOBRAPEAK, TOBRARND, AMIKACINPEAK, AMIKACINTROU, AMIKACIN,  in the last 72 hours   Microbiology: Recent Results (from the past 720 hour(s))  SURGICAL PCR SCREEN     Status: None   Collection Time    02/23/13  5:15 AM      Result Value Range Status   MRSA, PCR NEGATIVE  NEGATIVE Final   Staphylococcus aureus NEGATIVE  NEGATIVE Final   Comment:            The Xpert SA Assay (FDA     approved for NASAL specimens     in patients over 56 years of age),     is one component of     a comprehensive surveillance     program.  Test performance has     been validated by [employer] for patients greater     than or equal to 71 year old.     It is not intended     to diagnose infection nor to     guide or monitor treatment.    Medical History: Past Medical History  Diagnosis Date  . Hypertension   . Diabetes mellitus   .  CHF (congestive heart failure)   . Coronary artery disease   . Atrial fibrillation   . PVD (peripheral vascular disease)   . S/P CABG x 1 2008  . S/P aortic valve replacement with porcine valve 2008    Assessment:  Elevated WBC (though declining) and MD note mentions fever with possible PNA on 3/11 CXR. Scr acutely elevated from 1.65 to 2.28 overnight.  Goal of Therapy:  Vancomycin trough level 15-20 mcg/ml  Plan:  Vanco 1g IV x 1 then 750mg  IV q24h. Trough after 3-5 doses. Primaxin 250mg  IV q6hrs. Will have monitor renal function and adjust abx doses prn   Crystal S. Merilynn Finland, PharmD, BCPS Clinical Staff Pharmacist Pager [phone]  Misty Stanley Stillinger 02/26/2013,1:48 PM

## 2013-02-26 NOTE — Progress Notes (Signed)
SLP Cancellation Note  Patient Details Name: Aaron [name] MRN: [id number] DOB: Aug 28, 1924   Cancelled treatment:       Reason Eval/Treat Not Completed: Patient at procedure or test/unavailable (leaving unit for ultrasound. will f/u 3/12. )  Ferdinand Lango MA, CCC-SLP [phone]   Aaron [name] 02/26/2013, 2:21 PM

## 2013-02-26 NOTE — Progress Notes (Signed)
Physical medicine and rehabilitation consult was been requested. At this time recommend physical and occupational therapy evaluations to be completed and then will followup with formal rehabilitation consult and appropriate recommendations

## 2013-02-26 NOTE — Progress Notes (Signed)
Spoke w/ pt's CM, Henrietta.  She said pt's daughter thinks SNF is the better option for her father.  Therefore, no CIR consult will be done.  [phone]

## 2013-02-26 NOTE — Progress Notes (Signed)
[facility]  Patient Status: Active (receiving services up to time of hospitalization)  [facility] is providing the following services: RN and PT  If patient discharges after hours, please call [phone].   Aaron [name] 02/26/2013, 10:27 AM

## 2013-02-26 NOTE — Care Management Note (Unsigned)
    Page 1 of 2   03/01/2013     4:21:21 PM   CARE MANAGEMENT NOTE 03/01/2013  Patient:  Aaron [name], Aaron [name]   Account Number:  [id number]  Date Initiated:  02/26/2013  Documentation initiated by:  Alvira Philips Assessment:   79 yr-old male adm with (L) hip fx; lives alone, has walker and w/c, 24/7 paid caregivers plus housekeeper 4 hrs daily, active with [facility]     Action/Plan:   Anticipated DC Date:  03/02/2013   Anticipated DC Plan:  LONG TERM ACUTE CARE (LTAC)  In-house referral  Clinical Social Worker      DC Planning Services  CM consult      [facility] Choice  LONG TERM ACUTE CARE   Choice offered to / List presented to:  C-2 HC POA / Guardian           Status of service:  In process, will continue to follow Medicare Important Message given?   (If response is "NO", the following Medicare IM given date fields will be blank) Date Medicare IM given:   Date Additional Medicare IM given:    Discharge Disposition:    Per UR Regulation:  Reviewed for med. necessity/level of care/duration of stay  If discussed at Long Length of Stay Meetings, dates discussed:    Comments:  PCP:  Dr Bethann Punches with [facility]  Contact: Arvin Collard, dtr [phone]  03/01/13 JULIE AMERSON,RN,BSN [phone] MET WITH PT AND DAUGHTERS TO DISCUSS LTAC...THEY PREFER [facility] AND EVALUATION COMPLETED.  PT ELIGIBLE FOR LTAC.  PER DR GHERGHE, PT MAY BE ABLE TO TRANSFER TO [facility] 3/15 IF LABWORK IMPROVED.  DAUGHTERS AGREEABLE WITH PLAN.  DR Lafe Garin TO FOLLOW UP WITH JENNY FROM [facility] ON SATURDAY WITH HIS DECISION.  WILL UPDATE W/E CASE MANAGER.  02/26/13 1004 Henrietta Mayo RN BSN MSN CCM Met with dtr @ bedside.  Pt was staying with her due to loss of power, was ambulating with walker when he fell. Dtr states she is sure he will require SNF for rehab when medically stable.  Pt had a stay in [facility] 10/16-11/6, was currently active with [facility] for  therapy. Referral to CSW.

## 2013-02-26 NOTE — Progress Notes (Signed)
[facility] PROGRESS NOTE  Aaron [name] WUJ:[id number] DOB: 10-05-1924 DOA: 02/22/2013 PCP: Danella Penton., MD  Assessment/Plan: Principal Problem:   Hip fracture, left Active Problems:   Diabetes mellitus   A-fib   S/P CABG x 1   Aortic valve replaced   DNR (do not resuscitate)    1. Hip fracture-on anticoagulation, RESUME HEPARIN AND COUMADIN  2. Atrial fibrillation /aortic valve replacement with a porcine valveon Coumadin, start heparin drip when INR less than 2 without a bolus 3. Prior history of CVA in the setting of subtherapeutic INR 4. Diabetes mellitus continue sliding scale insulin, CBG elevated 5. Acute on chronic kidney injury creatinine is gone up from 1.69-2.0 to baseline of about 1.05 we'll start the patient on IV hydration, discontinue Toradol, renal USG, UA requested multiple times but not collected  6. Hyperkalemia, dehydration recheck labs today 7. Fever , possible PNA RUL , WILL REPEAT CXR    Code Status: DO NOT RESUSCITATE  Family Communication: family updated about patient's clinical progress  Disposition Plan:possible inpt rehab   Brief narrative:  Aaron [name] is an 79 y.o. 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 His son in law was there and witnessed this mechanical fall. Evaluation in emergency room included a head CT which showed no acute process. At the time of his hip showed left femoral neck fracture with mild displacement. His INR is 2.23, white count 19.7 thousand, hemoglobin of 11.7. His creatinine is 1.7 with BUN of 61. Orthopedics was consulted by DEP and has seen the patient.  Hospitalist was asked to admit him for hip fracture  Consultants:  Mable Paris, MD  Procedures:  none Antibiotics:  none   Reports lying comfortably. No events overnight. Eating this am without problems. No complaints or concerns     Objective: Filed Vitals:   02/25/13 2015 02/25/13 2029 02/26/13 0455 02/26/13 1101  BP:  130/66 105/48 96/54  Pulse:  86 79   Temp: 98.1 F (36.7 C)  98.1 F (36.7 C)   TempSrc:   Oral   Resp:  18 18   Height:      Weight:      SpO2:  100% 100%     Intake/Output Summary (Last 24 hours) at 02/26/13 1103 Last data filed at 02/26/13 0700  Gross per 24 hour  Intake 2315.83 ml  Output    700 ml  Net 1615.83 ml    Exam:  HENT:  Head: Atraumatic.  Nose: Nose normal.  Mouth/Throat: Oropharynx is clear and moist.  Eyes: Conjunctivae are normal. Pupils are equal, round, and reactive to light. No scleral icterus.  Neck: Neck supple. No tracheal deviation present.  Cardiovascular: Normal rate, regular rhythm, normal heart sounds and intact distal pulses.  Pulmonary/Chest: Effort normal and breath sounds normal. No respiratory distress.  Abdominal: Soft. Normal appearance and bowel sounds are normal. She exhibits no distension. There is no tenderness.  Musculoskeletal: She exhibits no edema and no tenderness.  Neurological: She is alert. No cranial nerve deficit.    Data Reviewed: Basic Metabolic Panel:  Recent Labs Lab 02/22/13 2203 02/24/13 0625 02/25/13 1203 02/26/13 0500  NA 138 135 133* 137  K 4.4 5.2* 5.1 5.1  CL 100 98 98 102  CO2 23 23 18* 24  GLUCOSE 145* 268* 347* 330*  BUN 61* 65* 64* 77*  CREATININE 1.69* 2.02* 1.65* 2.28*  CALCIUM 9.3 8.7 8.6 8.2*    Liver Function Tests:  Recent Labs Lab 02/22/13 2203 02/25/13 1203  AST 24 17  ALT 20 13  ALKPHOS 66 66  BILITOT 0.4 0.9  PROT 7.4 6.5  ALBUMIN 3.6 2.9*   No results found for this basename: LIPASE, AMYLASE,  in the last 168 hours No results  found for this basename: AMMONIA,  in the last 168 hours  CBC:  Recent Labs Lab 02/22/13 2203 02/24/13 0625 02/25/13 0900 02/26/13 0500  WBC 19.7* 17.4* 16.3* 13.6*  NEUTROABS 17.8*  --   --   --   HGB 11.7* 10.0* 9.9* 8.1*  HCT 36.0* 30.5* 30.3* 23.8*  MCV 79.8 79.0 79.3 77.5*  PLT 260 210 205 175    Cardiac Enzymes:  Recent Labs Lab 02/22/13 2203  TROPONINI <0.30   BNP (last 3 results)  Recent Labs  09/30/12 1233  PROBNP 29199.0*     CBG:  Recent Labs Lab 02/25/13 1459 02/25/13 1736 02/25/13 2101 02/26/13 0147 02/26/13 0508  GLUCAP 297* 304* 383* 348* 295*    Recent Results (from the past 240 hour(s))  SURGICAL PCR SCREEN     Status: None   Collection Time    02/23/13  5:15 AM      Result Value Range Status   MRSA, PCR NEGATIVE  NEGATIVE Final   Staphylococcus aureus NEGATIVE  NEGATIVE Final   Comment:            The Xpert SA Assay (FDA     approved for NASAL specimens     in patients over 52 years of age),     is one component of     a comprehensive surveillance     program.  Test performance has     been validated by [employer] for patients greater     than or equal to 77 year old.     It is not intended     to diagnose infection nor to     guide or monitor treatment.     Studies: Dg Chest 1 View  02/22/2013  *RADIOLOGY REPORT*  Clinical Data: Status post fall; concern for chest injury.  CHEST - 1 VIEW  Comparison: Chest radiograph performed 10/02/2012, and CT of the thoracic spine performed 10/02/2012  Findings: The lungs are well-aerated.  Mild vascular congestion is noted, without significant pulmonary edema.  There is no evidence of focal opacification, pleural effusion or pneumothorax.  The cardiomediastinal silhouette is borderline normal in size.  An aortic valve replacement is noted.  The patient is status post median sternotomy, with evidence of prior CABG.  No acute osseous abnormalities are seen.  IMPRESSION: Mild vascular  congestion noted; lungs remain grossly clear.  No displaced rib fractures seen.   Original Report Authenticated By: Tonia Ghent, M.D.    Dg Hip Complete Left  02/22/2013  *RADIOLOGY REPORT*  Clinical Data: Status post fall; left hip pain.  LEFT HIP - COMPLETE 2+ VIEW  Comparison: None.  Findings: There appears to be a mildly displaced basicervical fracture through the left femoral neck; the fracture line is difficult to fully characterize, but there is abnormal alignment of the proximal left femur.  The right hip joint is grossly unremarkable in appearance.  Mild degenerative change is noted at the  lower lumbar spine.  The sacroiliac joints are grossly unremarkable in appearance.  Diffuse vascular calcifications are seen.  The visualized bowel gas pattern is grossly unremarkable.  IMPRESSION:  1.  Mildly displaced basicervical fracture through the left femoral neck. 2.  Diffuse vascular calcifications seen.   Original Report Authenticated By: Tonia Ghent, M.D.    Ct Head Wo Contrast  02/22/2013  *RADIOLOGY REPORT*  Clinical Data: Fall with hip pain.  CT HEAD WITHOUT CONTRAST  Technique:  Contiguous axial images were obtained from the base of the skull through the vertex without contrast.  Comparison: 09/27/2012  Findings: Bone windows demonstrate mucosal thickening of the left maxillary sinus. Clear mastoid air cells.  Soft tissue windows demonstrate expected cerebral atrophy.  Mild low density in the periventricular white matter likely related to small vessel disease.Posterior left frontal lobe nonacute infarct, including on image 22/series 2. No  mass lesion, hemorrhage, hydrocephalus, acute infarct, intra-axial, or extra-axial fluid collection.  IMPRESSION:  1. No acute intracranial abnormality. 2.  Cerebral atrophy with remote left posterior frontal lobe infarct.   Original Report Authenticated By: Jeronimo Greaves, M.D.    Dg Pelvis Portable  02/25/2013  *RADIOLOGY REPORT*  Clinical Data: Left hip  prosthesis placement following a left femoral neck fracture.  PORTABLE PELVIS  Comparison: 02/22/2013.  Findings: Interval left hip prosthesis in satisfactory position and alignment.  No acute fracture or dislocation.  IMPRESSION: Satisfactory postoperative appearance of the left hip prosthesis.   Original Report Authenticated By: Beckie Salts, M.D.    Dg Chest Port 1 View  02/25/2013  *RADIOLOGY REPORT*  Clinical Data: Clinical concern for pneumonia.  No reason given.  PORTABLE CHEST - 1 VIEW  Comparison: 02/22/2013.  Findings: Interval small amount of ill-defined patchy opacity in the right upper lobe.  Interval mild increase in prominence of the interstitial markings, with Kerley lines on the right.  Mildly progressive enlargement of the cardiac silhouette.  Stable post CABG changes and prosthetic heart valve.  Stable thoracolumbar vertebroplasty material and diffuse osteopenia.  IMPRESSION:  1.  Interval mild changes of congestive heart failure with a small amount of alveolar edema or pneumonia in the right upper lobe. 2.  Mildly progressive cardiomegaly.   Original Report Authenticated By: Beckie Salts, M.D.     Scheduled Meds: . aspirin EC  81 mg Oral Daily  . atorvastatin  20 mg Oral q1800  . chlorhexidine  60 mL Topical Once  . docusate sodium  100 mg Oral BID  . DULoxetine  30 mg Oral QHS  . insulin aspart  0-15 Units Subcutaneous Q4H  . metoprolol succinate  75 mg Oral BID  . nystatin cream  1 application Topical QPM  . sodium chloride  250 mL Intravenous Once  . sodium chloride  3 mL Intravenous Q12H  . sodium chloride  3 mL Intravenous Q12H  . traZODone  25 mg Oral QHS  . warfarin  6 mg Oral ONCE-1800  . Warfarin - Pharmacist Dosing Inpatient   Does not apply q1800   Continuous Infusions: . sodium chloride 75 mL/hr at 02/26/13 0030  . heparin    . lactated ringers      Principal Problem:   Hip fracture, left Active Problems:   Diabetes mellitus   A-fib   S/P CABG x 1    Aortic valve replaced   DNR (do not resuscitate)    Time spent: 40 minutes   [facility]  [facility] Pager [phone]. If 8PM-8AM, please contact night-coverage at [email or web address], password  [email or web address] 02/26/2013, 11:03 AM  LOS: 4 days

## 2013-02-26 NOTE — Progress Notes (Signed)
PATIENT ID: Aaron [name]   1 Day Post-Op Procedure(s) (LRB): ARTHROPLASTY BIPOLAR HIP (Left)  Subjective: Lying comfortably in bed resting. Some difficulty with pain overnight.  Objective:  Filed Vitals:   02/26/13 0455  BP: 105/48  Pulse: 79  Temp: 98.1 F (36.7 C)  Resp: 18     Left hip dressing clean dry and intact.   Labs:   Recent Labs  02/24/13 0625 02/25/13 0900 02/26/13 0500  HGB 10.0* 9.9* 8.1*   Recent Labs  02/25/13 0900 02/26/13 0500  WBC 16.3* 13.6*  RBC 3.82* 3.07*  HCT 30.3* 23.8*  PLT 205 175   Recent Labs  02/25/13 1203 02/26/13 0500  NA 133* 137  K 5.1 5.1  CL 98 102  CO2 18* 24  BUN 64* 77*  CREATININE 1.65* 2.28*  GLUCOSE 347* 330*  CALCIUM 8.6 8.2*    Assessment and Plan: Postoperative day #1 status post left hip hemiarthroplasty with ORIF of the greater trochanter fracture Nonweightbearing left lower extremity. No use of the prosthetic limb at this time. Okay to be up out of bed in chair. Anticoagulation per primary team. Okay for heparin transition until anticoagulated on Coumadin. INR trending in the right direction. Acute blood loss anemia: Observe, Recheck hemoglobin in the morning. If becomes symptomatic, will transfuse.  VTE proph: Coumadin

## 2013-02-26 NOTE — Progress Notes (Signed)
ANTICOAGULATION CONSULT NOTE - Follow Up Consult  Pharmacy Consult for Heparin/Coumadin  Indication: atrial fibrillation  Allergies  Allergen Reactions  . Metformin And Related Hives  . Penicillins Hives and Swelling    Patient Measurements: Height: 5\' 7"  (170.2 cm) Weight: 150 lb 4.8 oz (68.176 kg) IBW/kg (Calculated) : 66.1 Heparin Dosing Weight: 68.2kg   Vital Signs: Temp: 97.4 F (36.3 C) (03/11 1330) Temp src: Axillary (03/11 1330) BP: 106/67 mmHg (03/11 1330) Pulse Rate: 62 (03/11 1330)  Labs:  Recent Labs  02/24/13 0625 02/24/13 1611 02/25/13 0900 02/25/13 1203 02/26/13 0500 02/26/13 1755  HGB 10.0*  --  9.9*  --  8.1*  --   HCT 30.5*  --  30.3*  --  23.8*  --   PLT 210  --  205  --  175  --   LABPROT 25.5* 23.6* 16.9*  --  20.1*  --   INR 2.46* 2.21* 1.41  --  1.78*  --   HEPARINUNFRC  --   --   --   --   --  <0.10*  CREATININE 2.02*  --   --  1.65* 2.28*  --     Estimated Creatinine Clearance: 21.7 ml/min (by C-G formula based on Cr of 2.28).   Medications:  . aspirin EC  81 mg Oral Daily  . atorvastatin  20 mg Oral q1800  . chlorhexidine  60 mL Topical Once  . [EXPIRED] clindamycin      . [COMPLETED] clindamycin (CLEOCIN) IV  600 mg Intravenous Q6H  . docusate sodium  100 mg Oral BID  . DULoxetine  30 mg Oral QHS  . insulin aspart  0-15 Units Subcutaneous Q4H  . [START ON 02/27/2013] insulin glargine  30 Units Subcutaneous QAC breakfast  . metoprolol succinate  25 mg Oral BID  . nystatin cream  1 application Topical QPM  . [COMPLETED] sodium chloride  250 mL Intravenous Once  . sodium chloride  3 mL Intravenous Q12H  . sodium chloride  3 mL Intravenous Q12H  . traZODone  25 mg Oral QHS  . [START ON 02/27/2013] vancomycin  750 mg Intravenous Q24H  . vancomycin  1,000 mg Intravenous Once  . [COMPLETED] warfarin  6 mg Oral ONCE-1800  . [COMPLETED] warfarin  6 mg Oral ONCE-1800  . Warfarin - Pharmacist Dosing Inpatient   Does not apply q1800  .  [DISCONTINUED] amLODipine  5 mg Oral q morning - 10a  . [DISCONTINUED] insulin glargine  30 Units Subcutaneous QHS  . [DISCONTINUED] metoprolol succinate  75 mg Oral BID  . [DISCONTINUED] warfarin  5 mg Oral ONCE-1800   Assessment: 79 yo male admitted 02/22/2013  with atrial fibrillation and porcine AVR on chronic coumadin (PTA 6mg  daily except 3mg  once weekly), which was held for hip surgery 3/10. Patient received vitamin K 0.5mg  IV. Coumadin was resumed 3/10 post-op.  heparin bridge to cover patient until INR therapeutic (ok'd by ortho), started 3/11. CHADS2=4.   Events:  Heparin level reported at < 0.1 on 950 units/hr.  Heparin infusing without problems, patient denies bleeding.    Goal of Therapy:  INR 2-3 Heparin level 0.3-0.7 units/ml Monitor platelets by anticoagulation protocol: Yes   Plan:  Increase heparin to 1150 units/hr (no bolus) Check HL in 8 hours , with am labs Monitor H/H, plts and renal function    Thank you for allowing pharmacy to be a part of this patients care team.  Lovenia Kim Pharm.D., BCPS Clinical Pharmacist 02/26/2013 7:32 PM Pager: [phone] Phone: [phone]

## 2013-02-26 NOTE — Progress Notes (Signed)
ANTICOAGULATION CONSULT NOTE - Follow Up Consult  Pharmacy Consult for Heparin/Coumadin  Indication: atrial fibrillation  Allergies  Allergen Reactions  . Metformin And Related Hives  . Penicillins Hives and Swelling    Patient Measurements: Height: 5\' 7"  (170.2 cm) Weight: 150 lb 4.8 oz (68.176 kg) IBW/kg (Calculated) : 66.1 Heparin Dosing Weight: 68.2kg   Vital Signs: Temp: 98.1 F (36.7 C) (03/11 0455) Temp src: Oral (03/11 0455) BP: 105/48 mmHg (03/11 0455) Pulse Rate: 79 (03/11 0455)  Labs:  Recent Labs  02/24/13 0625 02/24/13 1611 02/25/13 0900 02/25/13 1203 02/26/13 0500  HGB 10.0*  --  9.9*  --  8.1*  HCT 30.5*  --  30.3*  --  23.8*  PLT 210  --  205  --  175  LABPROT 25.5* 23.6* 16.9*  --  20.1*  INR 2.46* 2.21* 1.41  --  1.78*  CREATININE 2.02*  --   --  1.65* 2.28*    Estimated Creatinine Clearance: 21.7 ml/min (by C-G formula based on Cr of 2.28).   Medications:  . amLODipine  5 mg Oral q morning - 10a  . aspirin EC  81 mg Oral Daily  . atorvastatin  20 mg Oral q1800  . chlorhexidine  60 mL Topical Once  . [EXPIRED] clindamycin      . [COMPLETED] clindamycin (CLEOCIN) IV  600 mg Intravenous Q6H  . docusate sodium  100 mg Oral BID  . DULoxetine  30 mg Oral QHS  . insulin aspart  0-15 Units Subcutaneous Q4H  . metoprolol succinate  75 mg Oral BID  . nystatin cream  1 application Topical QPM  . sodium chloride  3 mL Intravenous Q12H  . sodium chloride  3 mL Intravenous Q12H  . traZODone  25 mg Oral QHS  . [COMPLETED] warfarin  6 mg Oral ONCE-1800  . Warfarin - Pharmacist Dosing Inpatient   Does not apply q1800  . [COMPLETED] clindamycin  600 mg Intravenous Once  . [DISCONTINUED] warfarin  5 mg Oral ONCE-1800   Assessment: 79 yo male with atrial fibrillation and porcine AVR on chronic coumadin (PTA 6mg  daily except 3mg  once weekly), which was held for hip surgery 3/10. Patient received vitamin K 0.5mg  IV. Coumadin was resumed 3/10 post-op.   Will initiate a heparin bridge to cover patient until INR therapeutic (ok'd by ortho). CHADS2=4.   INR 1.41 to 1.78 overnight. SCr 2.28 and CrCl 21.60ml/min. H/H 8.1/23.8 and plts 175 (H/H 9.9/30.3 and plts 205 on 3/10). No evidence of bleeding at this time.   Goal of Therapy:  INR 2-3 Heparin level 0.3-0.7 units/ml Monitor platelets by anticoagulation protocol: Yes   Plan:  Coumadin 6mg  x1 tonight  Daily PT/INR Start heparin at 950 units/hr (no bolus) Check HL in 6 hours  Monitor H/H, plts and renal function   Micheline Chapman PharmD Candidate  02/26/2013,10:40 AM

## 2013-02-26 NOTE — Progress Notes (Signed)
OT Cancellation Note  Patient Details Name: Aaron [name] MRN: [id number] DOB: [birth year]   Cancelled Treatment:    Reason Eval/Treat Not Completed: Other (comment) Pt to D/C to SNF. Defer OT to SNF. Please reorder if needed. Thank you.OT signing off.  [facility], OTR/L  [phone] 02/26/2013 02/26/2013, 12:48 PM

## 2013-02-26 NOTE — Evaluation (Signed)
Physical Therapy Evaluation Patient Details Name: Aaron [name] MRN: [id number] DOB: 20-Mar-1924 Today's Date: 02/26/2013 Time: 4098-1191 PT Time Calculation (min): 26 min  PT Assessment / Plan / Recommendation Clinical Impression  Pt admitted after fall at home using LLE prosthesis with left hip fx s/p hemiarthroplasty. Pt with decreased cognition today per dgtr who was present throughout and unable to localize pain and appears to have muscle spasms. Pt only able to tolerate bil UE and RLE movement today and would not allow LLE to be moved. Pt with grimace and report of pain with LUE and RLE movement and only able to partially bend right knee with assist and LUE with inconsistent movement at times resistant to shoulder flexion but end of session lifting LUE on his own. Dgtr educated for hip precautions and agreeable to SNF for DC. Will follow acutely to maximize mobility, transfers, function and to decrease burden of care.     PT Assessment  Patient needs continued PT services    Follow Up Recommendations  SNF;Supervision/Assistance - 24 hour    Does the patient have the potential to tolerate intense rehabilitation      Barriers to Discharge Decreased caregiver support      Equipment Recommendations  None recommended by PT    Recommendations for Other Services     Frequency Min 3X/week    Precautions / Restrictions Precautions Precautions: Fall;Posterior Hip Restrictions Weight Bearing Restrictions: Yes LLE Weight Bearing: Non weight bearing   Pertinent Vitals/Pain PAINAD= 2      Mobility  Bed Mobility Bed Mobility: Not assessed Details for Bed Mobility Assistance: unable to test due to pain and resistance Transfers Transfers: Not assessed Ambulation/Gait Ambulation/Gait Assistance: Not tested (comment)    Exercises     PT Diagnosis: Acute pain;Generalized weakness  PT Problem List: Decreased strength;Decreased range of motion;Decreased activity tolerance;Decreased  cognition;Pain PT Treatment Interventions: Therapeutic activities;Therapeutic exercise;Functional mobility training;Patient/family education   PT Goals Acute Rehab PT Goals PT Goal Formulation: With patient/family Time For Goal Achievement: 03/12/13 Potential to Achieve Goals: Fair Pt will Roll Supine to Right Side: with mod assist PT Goal: Rolling Supine to Right Side - Progress: Goal set today Pt will Roll Supine to Left Side: with mod assist PT Goal: Rolling Supine to Left Side - Progress: Goal set today Pt will go Supine/Side to Sit: with mod assist PT Goal: Supine/Side to Sit - Progress: Goal set today Pt will go Sit to Stand: with max assist PT Goal: Sit to Stand - Progress: Goal set today Pt will go Stand to Sit: with max assist PT Goal: Stand to Sit - Progress: Goal set today  Visit Information  Last PT Received On: 02/26/13 Assistance Needed: +2    Subjective Data  Subjective: I just can't do it Patient Stated Goal: pt unable to state, family agreeable to increase mobility   Prior Functioning  Home Living Lives With: Other (Comment) Available Help at Discharge: Personal care attendant;Available 24 hours/day Type of Home: House Home Access: Ramped entrance Home Layout: Two level;Able to live on main level with bedroom/bathroom Home Adaptive Equipment: Grab bars in shower;Walker - rolling;Tub transfer bench;Grab bars around toilet;Wheelchair - manual;Bedside commode/3-in-1 Additional Comments: couch and bed are on risers Prior Function Level of Independence: Needs assistance Needs Assistance: Bathing;Dressing;Light Housekeeping;Meal Prep;Transfers;Gait Bath: Moderate Dressing: Moderate Meal Prep: Total Light Housekeeping: Total Gait Assistance: able to walk with prosthesis and RW grossly 100' Transfer Assistance: supervision for transfers Able to Take Stairs?: No Driving: No Vocation: Retired  Cognition  Cognition Overall Cognitive Status: Impaired Area of  Impairment: Attention Arousal/Alertness: Awake/alert Orientation Level: Disoriented X4 Behavior During Session: Flat affect Current Attention Level: Focused Cognition - Other Comments: Pt unable to rate or localize pain, inconsistently following commands    Extremity/Trunk Assessment Right Upper Extremity Assessment RUE ROM/Strength/Tone: [facility] for tasks assessed;Unable to fully assess;Due to impaired cognition (pt able to perform elbow and shoulder flexion) Left Upper Extremity Assessment LUE ROM/Strength/Tone: [facility] for tasks assessed;Unable to fully assess;Due to impaired cognition (pt able to AROM perform shoulder flexion and elbow extension) Right Lower Extremity Assessment RLE ROM/Strength/Tone: Deficits;Unable to fully assess;Due to impaired cognition RLE ROM/Strength/Tone Deficits: Pt would only bend his knee grossly 30degrees with assist x 2 and otherwise reported pain but would not state where Left Lower Extremity Assessment LLE ROM/Strength/Tone: Deficits;Unable to fully assess;Due to pain;Due to impaired cognition LLE ROM/Strength/Tone Deficits: pt denied any movement of LLE   Balance Balance Balance Assessed: No  End of Session PT - End of Session Activity Tolerance: Patient limited by pain Patient left: in bed;with call bell/phone within reach;with family/visitor present  GP     Toney Sang [facility] 02/26/2013, 11:40 AM Delaney Meigs, PT [phone]

## 2013-02-27 ENCOUNTER — Inpatient Hospital Stay (HOSPITAL_COMMUNITY): Payer: Medicare Other

## 2013-02-27 ENCOUNTER — Encounter (HOSPITAL_COMMUNITY): Payer: Self-pay | Admitting: Orthopedic Surgery

## 2013-02-27 LAB — CBC
HCT: 22.8 % — ABNORMAL LOW (ref 39.0–52.0)
MCH: 26.3 pg (ref 26.0–34.0)
MCHC: 33.8 g/dL (ref 30.0–36.0)
MCV: 77.8 fL — ABNORMAL LOW (ref 78.0–100.0)
RDW: 17.4 % — ABNORMAL HIGH (ref 11.5–15.5)

## 2013-02-27 LAB — GLUCOSE, CAPILLARY
Glucose-Capillary: 205 mg/dL — ABNORMAL HIGH (ref 70–99)
Glucose-Capillary: 242 mg/dL — ABNORMAL HIGH (ref 70–99)

## 2013-02-27 LAB — URINE MICROSCOPIC-ADD ON

## 2013-02-27 LAB — DIFFERENTIAL
Basophils Absolute: 0.1 10*3/uL (ref 0.0–0.1)
Basophils Relative: 0 % (ref 0–1)
Eosinophils Relative: 4 % (ref 0–5)
Lymphocytes Relative: 9 % — ABNORMAL LOW (ref 12–46)
Neutro Abs: 10.9 10*3/uL — ABNORMAL HIGH (ref 1.7–7.7)

## 2013-02-27 LAB — URINALYSIS, ROUTINE W REFLEX MICROSCOPIC
Glucose, UA: NEGATIVE mg/dL
Protein, ur: NEGATIVE mg/dL

## 2013-02-27 LAB — CREATININE, URINE, RANDOM: Creatinine, Urine: 142.48 mg/dL

## 2013-02-27 LAB — HEPATIC FUNCTION PANEL
ALT: 944 U/L — ABNORMAL HIGH (ref 0–53)
AST: 1051 U/L — ABNORMAL HIGH (ref 0–37)
Albumin: 2.7 g/dL — ABNORMAL LOW (ref 3.5–5.2)
Total Protein: 5.6 g/dL — ABNORMAL LOW (ref 6.0–8.3)

## 2013-02-27 LAB — BASIC METABOLIC PANEL
BUN: 83 mg/dL — ABNORMAL HIGH (ref 6–23)
Creatinine, Ser: 2.76 mg/dL — ABNORMAL HIGH (ref 0.50–1.35)
GFR calc Af Amer: 22 mL/min — ABNORMAL LOW (ref >90)
GFR calc non Af Amer: 19 mL/min — ABNORMAL LOW (ref >90)
Potassium: 4.6 mEq/L (ref 3.5–5.1)

## 2013-02-27 LAB — HEPARIN LEVEL (UNFRACTIONATED): Heparin Unfractionated: 0.16 IU/mL — ABNORMAL LOW (ref 0.30–0.70)

## 2013-02-27 MED ORDER — WARFARIN SODIUM 2 MG PO TABS
2.0000 mg | ORAL_TABLET | Freq: Once | ORAL | Status: DC
  Filled 2013-02-27: qty 1

## 2013-02-27 MED ORDER — SODIUM CHLORIDE 0.9 % IV SOLN
250.0000 mg | Freq: Two times a day (BID) | INTRAVENOUS | Status: DC
  Administered 2013-02-27 – 2013-03-01 (×4): 250 mg via INTRAVENOUS
  Filled 2013-02-27 (×5): qty 250

## 2013-02-27 MED ORDER — VANCOMYCIN HCL 1000 MG IV SOLR
750.0000 mg | INTRAVENOUS | Status: DC
  Administered 2013-02-27: 750 mg via INTRAVENOUS
  Filled 2013-02-27 (×2): qty 750

## 2013-02-27 MED ORDER — INSULIN GLARGINE 100 UNIT/ML ~~LOC~~ SOLN: 40.0000 [IU] | Freq: Every day | SUBCUTANEOUS | Status: DC

## 2013-02-27 MED ORDER — FUROSEMIDE 10 MG/ML IJ SOLN
20.0000 mg | Freq: Once | INTRAMUSCULAR | Status: AC
  Administered 2013-02-27: 20 mg via INTRAVENOUS

## 2013-02-27 MED ORDER — GLUCERNA SHAKE PO LIQD
237.0000 mL | Freq: Two times a day (BID) | ORAL | Status: DC
  Administered 2013-02-27 – 2013-03-02 (×6): 237 mL via ORAL
  Filled 2013-02-27: qty 237

## 2013-02-27 MED ORDER — INSULIN GLARGINE 100 UNIT/ML ~~LOC~~ SOLN
30.0000 [IU] | Freq: Every day | SUBCUTANEOUS | Status: DC
  Administered 2013-02-28 – 2013-03-01 (×2): 30 [IU] via SUBCUTANEOUS

## 2013-02-27 MED ORDER — FUROSEMIDE 10 MG/ML IJ SOLN
INTRAMUSCULAR | Status: AC
  Filled 2013-02-27: qty 4

## 2013-02-27 MED ORDER — METOPROLOL SUCCINATE 12.5 MG HALF TABLET
12.5000 mg | ORAL_TABLET | Freq: Two times a day (BID) | ORAL | Status: DC
  Administered 2013-02-27 – 2013-03-01 (×5): 12.5 mg via ORAL
  Filled 2013-02-27 (×7): qty 1

## 2013-02-27 NOTE — Progress Notes (Signed)
INITIAL NUTRITION ASSESSMENT  DOCUMENTATION CODES Per approved criteria  -Not Applicable   INTERVENTION: Glucerna Shake BID which will provide 440 kcal and 20 grams of protein  NUTRITION DIAGNOSIS: Increased nutrient needs related to wound healing as evidenced by left hip fracture.   Goal: Intake to meet >/=90% estimated nutrition needs  Monitor:  Tolerance of Glucerna Shake, diet advancement, PO's, weight trends, labs  Reason for Assessment: Low Braden  79 y.o. male  Admitting Dx: Hip fracture, left  ASSESSMENT: Pt admitted after fall onto left hip and experiencing a left hip fracture. CXR on 3/7 found no displaced or fractured ribs. Head CT on 3/7 showed cerebral atrophy with remote left posterior frontal lobe infarct. Pt underwent hip surgery on 2/10.   Pt is currently asleep and did not awaken to name call. 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 Per RN note on 3/9 pt is refusing insulin doses stating that he "knows better than the ordering physician his blood sugar regiment."  Plans to d/c with [facility] or to SNF.   Pt now on a full liquid diet, no meal completion documented. Will provide with Glucerna Shake BID which will provide 440 kcal and 20 grams of protein.  Height: Ht Readings from Last 1 Encounters:  02/23/13 5\' 7"  (1.702 m)    Weight: Wt Readings from Last 1 Encounters:  02/27/13 171 lb 4.8 oz (77.701 kg)    Ideal Body Weight: 148 lbs  % Ideal Body Weight: 115.5%  Wt Readings from Last 10 Encounters:  02/27/13 171 lb 4.8 oz (77.701 kg)  02/27/13 171 lb 4.8 oz (77.701 kg)  10/24/12 153 lb 7 oz (69.6 kg)  10/03/12 148 lb 13 oz (67.5 kg)    Usual Body Weight: 148 lbs  % Usual Body Weight: 115.5%  BMI:  Body mass index is 26.82 kg/(m^2).--Overweight   Estimated Nutritional Needs: Kcal: 1600-1800 Protein: 80-90 grams Fluid: 1.6-1.8 L/day  Skin: Left hip incision; Skin tear on right  leg  Diet Order: Full Liquid  EDUCATION NEEDS: -No education needs identified at this time   Intake/Output Summary (Last 24 hours) at 02/27/13 1025 Last data filed at 02/27/13 0537  Gross per 24 hour  Intake 2646.47 ml  Output    200 ml  Net 2446.47 ml    Last BM: PTA  Labs:   Recent Labs Lab 02/25/13 1203 02/26/13 0500 02/27/13 0530  NA 133* 137 134*  K 5.1 5.1 4.6  CL 98 102 102  CO2 18* 24 20  BUN 64* 77* 83*  CREATININE 1.65* 2.28* 2.76*  CALCIUM 8.6 8.2* 7.7*  GLUCOSE 347* 330* 309*    CBG (last 3)   Recent Labs  02/26/13 2359 02/27/13 0421 02/27/13 0834  GLUCAP 322* 281* 238*    Scheduled Meds: . aspirin EC  81 mg Oral Daily  . atorvastatin  20 mg Oral q1800  . chlorhexidine  60 mL Topical Once  . docusate sodium  100 mg Oral BID  . DULoxetine  30 mg Oral QHS  . imipenem-cilastatin  250 mg Intravenous Q12H  . insulin aspart  0-15 Units Subcutaneous Q4H  . [START ON 02/28/2013] insulin glargine  40 Units Subcutaneous QAC breakfast  . metoprolol succinate  12.5 mg Oral BID  . nystatin cream  1 application Topical QPM  . sodium chloride  3 mL Intravenous Q12H  . sodium chloride  3 mL Intravenous Q12H  . traZODone  25 mg Oral QHS  . vancomycin  750 mg Intravenous Q48H  . vancomycin  1,000 mg Intravenous Once  . Warfarin - Pharmacist Dosing Inpatient   Does not apply q1800    Continuous Infusions: . sodium chloride 125 mL/hr at 02/27/13 0952  . lactated ringers      Past Medical History  Diagnosis Date  . Hypertension   . Diabetes mellitus   . CHF (congestive heart failure)   . Coronary artery disease   . Atrial fibrillation   . PVD (peripheral vascular disease)   . S/P CABG x 1 2008  . S/P aortic valve replacement with porcine valve 2008    Past Surgical History  Procedure Laterality Date  . Below knee leg amputation  07/2012  . Cataract extraction w/ intraocular lens implant    . Abdominal surgery    . Hernia repair      x5  .  Back surgery      vertebroplasty  . Coronary artery bypass graft  2008  . Hip arthroplasty Left 02/25/2013    Procedure: ARTHROPLASTY BIPOLAR HIP;  Surgeon: Mable Paris, MD;  Location: [facility] OR;  Service: Orthopedics;  Laterality: Left;    Trenton Gammon Dietetic Intern # [phone]  Maureen Chatters, RD, LDN Pager #: [phone] After-Hours Pager #: [phone]

## 2013-02-27 NOTE — Procedures (Signed)
SLP reviewed and agree with student findings.   Leah McCoy MA, CCC-SLP [phone]    

## 2013-02-27 NOTE — Progress Notes (Signed)
ANTICOAGULATION CONSULT NOTE - Follow Up Consult  Pharmacy Consult for Coumadin Indication: atrial fibrillation  Allergies  Allergen Reactions  . Metformin And Related Hives  . Penicillins Hives and Swelling    Patient Measurements: Height: 5\' 7"  (170.2 cm) Weight: 171 lb 4.8 oz (77.701 kg) IBW/kg (Calculated) : 66.1 Heparin Dosing Weight:    Vital Signs: Temp: 97.9 F (36.6 C) (03/12 0419) Temp src: Oral (03/12 0419) BP: 103/65 mmHg (03/12 0419) Pulse Rate: 71 (03/12 0419)  Labs:  Recent Labs  02/25/13 0900 02/25/13 1203 02/26/13 0500 02/26/13 1755 02/27/13 0530  HGB 9.9*  --  8.1*  --  7.7*  HCT 30.3*  --  23.8*  --  22.8*  PLT 205  --  175  --  169  LABPROT 16.9*  --  20.1*  --  28.2*  INR 1.41  --  1.78*  --  2.82*  HEPARINUNFRC  --   --   --  <0.10* 0.16*  CREATININE  --  1.65* 2.28*  --  2.76*    Estimated Creatinine Clearance: 18 ml/min (by C-G formula based on Cr of 2.76).  Assessment: Hip surgery 3/10.  AC: Afib + porcine AVR. Coumadin PTA (6mg  daily, except 3mg  once/week). Vit K 0.5mg  given 3/9. Coumadin resumed post-op 3/10. INR 1.78. Ortho ok'd to start heparin post-op. CHADS2=4.  Infectious Disease: Elevated WBC 13 (though declining) and MD note mentions fever with possible PNA on 3/11 CXR. Scr acutely elevated from 1.65>>2.28>>2.76. Abx started for PNA. No cultures.  3/12 Vanco>> 3/12 Primaxin>>  Cardiovascular: Hx HTN, CHF, CAD, PVD, Afib and porcine AVR. On ASA 81mg , Toprol, atorvastatin, amlodpine. BP 103/65, HR 71  Endocrinology: Hx DM. CBGs 220-385. On SSI + Lantus resume.  Gastrointestinal: clear liquid diet   Neurology: On Cymbalta.   Nephrology: SCr up again to 2.75 today now in ARF with negligible UOP.  Ortho: THR 3/10  Pulmonary: 95% on 1L [place]  Hematology/Oncology: H/H 7.7/22.8 (decr) and plts 169 post-op  PTA meds: Zocor Demadex  Goal of Therapy:  INR 2-3 Monitor platelets by anticoagulation protocol: Yes   Plan:  Hold  Coumadin tonight due to dramatic jump in INR  Heparin infusion d/c'd. Change Vanco to 1g IV q48hrs for ARF Change Primaxin to 250mg  IV q12hrs for ARF   Pasty Spillers 02/27/2013,9:16 AM

## 2013-02-27 NOTE — Clinical Social Work Psychosocial (Signed)
Late entry 02/27/13 for 02/26/13   Clinical Social Work Department BRIEF PSYCHOSOCIAL ASSESSMENT 02/27/2013  Patient:  Aaron [name], Aaron [name]     Account Number:  [id number]     Admit date:  02/22/2013  Clinical Social Worker:  Thomasene Mohair  Date/Time:  02/26/2013 02:00 PM  Referred by:  Physician  Date Referred:  02/26/2013 Referred for  SNF Placement   Other Referral:   Interview type:  Family Other interview type:    PSYCHOSOCIAL DATA Living Status:  ALONE Admitted from facility:   Level of care:   Primary support name:  [name] [phone] Primary support relationship to patient:  CHILD, ADULT Degree of support available:   Pt has strong family support in addition to 24 hr paid care givers at home.    CURRENT CONCERNS Current Concerns  Post-Acute Placement   Other Concerns:    SOCIAL WORK ASSESSMENT / PLAN CSW was referred to Pt to assist with dc planning. Pt is widowed with 3 adult children. 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 He is active adn well known for his leadership in his community.  Pt's family arranged for 24 hour care giver support after an illness last fall. Pt fell while at his daughter's house and broke his hip. He will need rehab once medically stable.  Pt has been to CIR at [facility] and family is hoping that he can go again but is agreeable to SNF if he does not meet criteria for CIR.  CSW discussed SNF options with family and will begin SNF process as a back-up plan.   Assessment/plan status:  Psychosocial Support/Ongoing Assessment of Needs Other assessment/ plan:   Follow up with CIR   Information/referral to community resources:   SNF list    PATIENT'S/FAMILY'S RESPONSE TO PLAN OF CARE: Pt's family is aware of SNF's in their community. Pt has been 2x's before and to CIR last fall.  Pt's family is supportive and realistic with Pt's abilities.   Frederico Hamman, LCSW [phone]

## 2013-02-27 NOTE — Procedures (Signed)
Objective Swallowing Evaluation: Modified Barium Swallowing Study  Patient Details  Name: Aaron [name] MRN: [id number] Date of Birth: August 15, 1924  Today's Date: 02/27/2013 Time: 1410-1430 SLP Time Calculation (min): 20 min  Past Medical History:  Past Medical History  Diagnosis Date  . Hypertension   . Diabetes mellitus   . CHF (congestive heart failure)   . Coronary artery disease   . Atrial fibrillation   . PVD (peripheral vascular disease)   . S/P CABG x 1 2008  . S/P aortic valve replacement with porcine valve 2008   Past Surgical History:  Past Surgical History  Procedure Laterality Date  . Below knee leg amputation  07/2012  . Cataract extraction w/ intraocular lens implant    . Abdominal surgery    . Hernia repair      x5  . Back surgery      vertebroplasty  . Coronary artery bypass graft  2008  . Hip arthroplasty Left 02/25/2013    Procedure: ARTHROPLASTY BIPOLAR HIP;  Surgeon: Mable Paris, MD;  Location: [facility] OR;  Service: Orthopedics;  Laterality: Left;   HPI:  [name] is an 79 y.o. 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 His son in law was there and witnessed this mechanical fall. Evaluation in emergency room included a head CT which showed no acute process. CT of hip showed left femoral neck fracture with mild displacement. S/p repair 3/10. Possible RUL PNA noted as well. CXR : Increased air space densities in the right upper lobe and right ower lobe suggesting edema or infection.     Assessment / Plan / Recommendation Clinical Impression  Dysphagia Diagnosis: Mild oral phase  dysphagia;Moderate pharyngeal phase dysphagia Clinical impression:  Patient presents with a moderate oropharyngeal dysphagia characterized by a delay in swallow initiation due to reduced base of tongue retraction and reduced a/p propulsion in concurrence with recent CVA in 10/13 (resulting in dysphagia), decondtioning from recent hospital course, and patients advanced age. This resulted in penetration/aspiration of thin liquids not ejected by spontaneous throat clear and/or multiple swallows, however use of chin tuck strategy with nectar thick liquids proved effective in adequate airway protection and reducing occurence of penetration/aspiration. SLP provided max cues for use of chin tuck (patient HOH), and recommends full supervision for use of this compensatory strategy. SLP provided patient and daughter with education and rationale for current diet and encouraged her to contact SLP with any further questions. SLP will f/u for diet tolerance.    Treatment Recommendation  Therapy as outlined in treatment plan below    Diet Recommendation Nectar-thick liquid;Dysphagia 3 (Mechanical Soft)   Liquid Administration via: Cup;No straw Medication Administration: Whole meds with puree Supervision: Patient able to self feed;Full supervision/cueing for compensatory strategies Compensations: Slow rate;Small sips/bites;Clear throat intermittently Postural Changes and/or Swallow Maneuvers: Seated upright 90 degrees;Upright 30-60 min after meal;Chin tuck    Other  Recommendations Recommended Consults: MBS Oral Care Recommendations: Oral care BID Other Recommendations: Order thickener from pharmacy;Remove water pitcher;Prohibited food (jello, ice cream, thin soups)   Follow Up Recommendations  Other (comment) (SLP to f/u at bedside)    Frequency and Duration min 2x/week  2 weeks   Pertinent Vitals/Pain None reported  SLP Swallow Goals Patient will utilize recommended strategies during swallow to  increase swallowing safety with: Supervision/safety Swallow Study Goal #2 - Progress:  (new goal)   General HPI: Aaron [name] is an 79 y.o. 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 His son in law was there and witnessed this mechanical fall. Evaluation in emergency room included a head CT which showed no acute process. CT of hip showed left femoral neck fracture with mild displacement. S/p repair 3/10. Possible RUL PNA noted as well. CXR : Increased air space densities in the right upper lobe and right ower lobe suggesting edema or infection. Type of Study: Modified Barium Swallowing Study Reason for Referral: Objectively evaluate swallowing function Previous Swallow Assessment: MBS 09/2012, silent aspiration, recommended dys 2, thin with chin tuck and multiple compensatory strategies Diet Prior to this Study: Thin liquids (clear liquids) Temperature Spikes Noted: No Respiratory Status: Supplemental O2 delivered via (comment) History of Recent Intubation: Yes Length of Intubations (days):  (for recent surgery) Behavior/Cognition: Alert;Cooperative;Pleasant mood;Hard of hearing;Requires cueing Oral Cavity - Dentition: Adequate natural dentition Oral Motor / Sensory Function: Within functional limits Self-Feeding Abilities: Able to feed self Patient Positioning: Upright in chair Baseline Vocal Quality: Clear Volitional Cough: Strong Volitional Swallow: Able to elicit Anatomy: Within functional limits Pharyngeal Secretions: Not observed secondary MBS    Reason for Referral Objectively evaluate swallowing function   Oral Phase  Oral Preparation/Oral Phase Oral Phase: Impaired Oral - Nectar Oral - Nectar Teaspoon: Not tested (trace residue) Oral - Nectar Cup: Lingual/palatal residue Oral - Thin Oral - Thin Teaspoon: Lingual/palatal residue;Reduced posterior propulsion Oral - Thin Cup: Lingual/palatal residue;Reduced posterior propulsion Oral - Thin Straw: Reduced posterior propulsion Oral - Solids Oral - Puree: Lingual/palatal residue;Reduced posterior propulsion Oral - Mechanical Soft: Lingual/palatal residue;Reduced posterior propulsion (prolonged mastication, not uncommon given advanced age)   Pharyngeal Phase Pharyngeal Phase Pharyngeal Phase: Impaired Pharyngeal - Nectar Pharyngeal - Nectar Teaspoon: Not tested Pharyngeal - Nectar Cup: Delayed swallow initiation;Premature spillage to valleculae;Reduced tongue base retraction;Penetration/Aspiration before swallow;Pharyngeal residue - valleculae (trace residue) Penetration/Aspiration details (nectar cup): Material enters airway, remains ABOVE vocal cords and not ejected out Pharyngeal - Thin Pharyngeal - Thin Teaspoon: Delayed swallow initiation;Premature spillage to valleculae;Reduced tongue base retraction;Penetration/Aspiration before swallow Penetration/Aspiration details (thin teaspoon): Material enters airway, remains ABOVE vocal cords and not ejected out Pharyngeal - Thin Cup: Delayed swallow initiation;Premature spillage to valleculae;Reduced tongue base retraction;Penetration/Aspiration before swallow Penetration/Aspiration details (thin cup): Material enters airway, CONTACTS cords and not ejected out Pharyngeal - Thin Straw: Delayed swallow initiation;Premature spillage to valleculae;Premature spillage to pyriform sinuses;Reduced tongue base retraction;Penetration/Aspiration before swallow;Trace aspiration Penetration/Aspiration details (thin straw): Material enters airway, passes BELOW cords and not ejected out despite cough attempt by  patient Pharyngeal - Solids Pharyngeal - Puree: Delayed swallow initiation;Premature spillage to valleculae;Premature spillage to pyriform sinuses;Reduced tongue base retraction;Pharyngeal residue - valleculae (trace residue) Pharyngeal - Mechanical Soft: Delayed swallow initiation;Premature spillage to valleculae;Premature spillage to pyriform sinuses;Reduced tongue base retraction;Pharyngeal residue - valleculae (trace residue)  Cervical Esophageal Phase    GO   Berdine Dance SLP student            Berdine Dance 02/27/2013, 4:26 PM

## 2013-02-27 NOTE — Evaluation (Signed)
Clinical/Bedside Swallow Evaluation Patient Details  Name: Aaron [name] MRN: [id number] Date of Birth: 1924-08-10  Today's Date: 02/27/2013 Time: 0910-0940 SLP Time Calculation (min): 30 min  Past Medical History:  Past Medical History  Diagnosis Date  . Hypertension   . Diabetes mellitus   . CHF (congestive heart failure)   . Coronary artery disease   . Atrial fibrillation   . PVD (peripheral vascular disease)   . S/P CABG x 1 2008  . S/P aortic valve replacement with porcine valve 2008   Past Surgical History:  Past Surgical History  Procedure Laterality Date  . Below knee leg amputation  07/2012  . Cataract extraction w/ intraocular lens implant    . Abdominal surgery    . Hernia repair      x5  . Back surgery      vertebroplasty  . Coronary artery bypass graft  2008  . Hip arthroplasty Left 02/25/2013    Procedure: ARTHROPLASTY BIPOLAR HIP;  Surgeon: Mable Paris, MD;  Location: [facility] OR;  Service: Orthopedics;  Laterality: Left;   HPI:  Aaron [name] is an 79 y.o. 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 His son in law was there and witnessed this mechanical fall. Evaluation in emergency room included a head CT which showed no acute process. CT of hip showed left femoral neck fracture with mild displacement. S/p repair 3/10. Possible RUL PNA noted as well. CXR : Increased air space densities in the right upper lobe and right ower lobe suggesting edema or infection.   Assessment / Plan / Recommendation Clinical Impression  Overall, patient presents with what appears to be a functional oropharyngeal  swallow without overt indication of aspiration based on bedside exam. While do suspect that swallowing function is improved from initial MBS completed s/p admission for CVA in 09/2012, given acute RML./RLL PNA, and silent nature of aspiration noted during previous swallowing studies, recommend an objective evaluation prior to advancing diet. Extensive education completed with patient and daugther who was present and supportive. Moderate verbal cueing provided for use of chin tuck with liquids and solids as previously recommended as a general precaution until testing can be complete this pm. Daughter in agreement with plan. MBS scheduled for 1330.     Aspiration Risk  Moderate    Diet Recommendation Thin liquid (clear liquids)   Liquid Administration via: Cup;Straw Medication Administration: Whole meds with liquid Supervision: Patient able to self feed;Full supervision/cueing for compensatory strategies Compensations: Slow rate;Small sips/bites Postural Changes and/or Swallow Maneuvers: Chin tuck;Seated upright 90 degrees;Upright 30-60 min after meal    Other  Recommendations Recommended Consults: MBS Oral Care Recommendations: Oral care BID   Follow Up Recommendations   (TBD pending MBS)    Frequency and Duration        Pertinent Vitals/Pain None reported     Swallow Study    General HPI: Aaron [name] is an 79 y.o. male, he retired Public affairs consultant, lives at home with his daughter and son-in-law, history of atrial fibrillation on chronic anticoagulation, status post porcine aortic valve replacement, coronary bypass graft time 1, diabetes, hypertension, history of congestive heart failure, peripheral vascular disease, status post left BKA, walking with his leg prosthesis, fell  today without any loss of consciousness. His son in law was there and witnessed this mechanical fall. Evaluation in emergency room included a head CT which showed no acute process. CT of hip showed left femoral  neck fracture with mild displacement. S/p repair 3/10. Possible RUL PNA noted as well. CXR : Increased air space densities in the right upper lobe and right ower lobe suggesting edema or infection. Type of Study: Bedside swallow evaluation Previous Swallow Assessment: MBS 09/2012, silent aspiration, recommended dys 2, thin with chin tuck and multiple compensatory strategies Diet Prior to this Study: Thin liquids (clear liquids) Temperature Spikes Noted: No Respiratory Status: Supplemental O2 delivered via (comment) (nasal cannula) History of Recent Intubation: Yes Length of Intubations (days):  (for surgery only) Behavior/Cognition: Alert;Cooperative;Pleasant mood Oral Cavity - Dentition: Adequate natural dentition (missing some dentition on the bottom) Self-Feeding Abilities: Able to feed self Patient Positioning: Upright in bed (slightly reclined due to hip pain) Baseline Vocal Quality: Clear Volitional Cough: Strong Volitional Swallow: Able to elicit    Oral/Motor/Sensory Function Overall Oral Motor/Sensory Function: Appears within functional limits for tasks assessed   Ice Chips Ice chips: Not tested   Thin Liquid Thin Liquid: Within functional limits Presentation: Cup;Self Fed;Straw    Nectar Thick Nectar Thick Liquid: Not tested   Honey Thick Honey Thick Liquid: Not tested   Puree Puree: Within functional limits Presentation: Spoon   Solid   GO   Aaron Steinhoff MA, CCC-SLP [phone]  Solid: Impaired Presentation: Self Fed Oral Phase Impairments: Impaired anterior to posterior transit (mildly delayed but functional)       Aaron [name] 02/27/2013,1:41 PM

## 2013-02-27 NOTE — Consult Note (Signed)
Physical Medicine and Rehabilitation Consult Reason for Consult: Left hip fracture Referring Physician: [facility]   HPI: Aaron [name] is a 79 y.o. right-handed male with history of diabetes mellitus, diastolic congestive heart failure as well as atrial fibrillation status post aortic valve replacement with chronic Coumadin therapy as well as left below-knee amputation with prosthesis August of 2013 and CVA October 2013 of which patient received inpatient rehabilitation services.. Admitted 02/23/2013 after a fall without loss of consciousness. Cranial CT scan showed no acute changes. X-ray showed left femoral neck fracture with mild displacement. INR admission of 2.23. Underwent left arthroplasty bipolar hip 02/25/2013 per Dr. Ave Filter. Advised nonweightbearing left lower extremity with posterior hip precautions. Postoperatively chronic Coumadin has been resumed. Hospital course question right upper lobe pneumonia placed on broad-spectrum antibiotics. Patient with acute renal failure with admission creatinine 1.69 elevated to 2.76. Renal ultrasound showed no hydronephrosis and renal consult has been obtained. Patient with bouts of confusion and close monitoring with the use of narcotics. Speech therapy followup for questionable dysphagia with evaluation ongoing currently on a full liquid diet and plan modified barium swallow 02/27/2013. Postoperative anemia 7.7 and transfused 2 units of packed red blood cells 02/27/2013. Physical therapy evaluation completed 02/26/2013 with noted patient does have a personal care attendant lives with family with recommendations of inpatient rehabilitation services versus skilled nursing facility. Physical medicine rehabilitation consult has been requested   Review of Systems  Cardiovascular: Positive for palpitations and leg swelling.  Musculoskeletal: Positive for falls.  All other systems reviewed and are negative.   Past Medical History  Diagnosis Date  . Hypertension    . Diabetes mellitus   . CHF (congestive heart failure)   . Coronary artery disease   . Atrial fibrillation   . PVD (peripheral vascular disease)   . S/P CABG x 1 2008  . S/P aortic valve replacement with porcine valve 2008   Past Surgical History  Procedure Laterality Date  . Below knee leg amputation  07/2012  . Cataract extraction w/ intraocular lens implant    . Abdominal surgery    . Hernia repair      x5  . Back surgery      vertebroplasty  . Coronary artery bypass graft  2008  . Hip arthroplasty Left 02/25/2013    Procedure: ARTHROPLASTY BIPOLAR HIP;  Surgeon: Mable Paris, MD;  Location: [facility] OR;  Service: Orthopedics;  Laterality: Left;   No family history on file. Social History:  reports that he has quit smoking. He uses smokeless tobacco. He reports that he does not drink alcohol or use illicit drugs. Allergies:  Allergies  Allergen Reactions  . Metformin And Related Hives  . Penicillins Hives and Swelling   Medications Prior to Admission  Medication Sig Dispense Refill  . amLODipine (NORVASC) 5 MG tablet Take 5 mg by mouth every morning.      Marland Kitchen aspirin EC 81 MG EC tablet Take 1 tablet (81 mg total) by mouth daily.      . barrier cream (NON-SPECIFIED) CREA Apply 1 application topically at bedtime as needed (for bed sores).      . Bisacodyl (DULCOLAX PO) Take 1 tablet by mouth daily as needed (for constipation).      . Calcium Carbonate-Vitamin D (CALCIUM 600+D) 600-400 MG-UNIT per tablet Take 1 tablet by mouth every morning.       . Cholecalciferol (VITAMIN D-3) 1000 UNITS CAPS Take 1,000 Units by mouth every morning.       . Diphenhydramine-APAP,  sleep, (TYLENOL PM EXTRA STRENGTH PO) Take 1-2 tablets by mouth at bedtime as needed (pt can take up to 2 tabs for sleep aide).      . DULoxetine (CYMBALTA) 30 MG capsule Take 30 mg by mouth at bedtime.       . insulin glargine (LANTUS) 100 UNIT/ML injection Inject 30 Units into the skin daily before breakfast.       . insulin lispro (HUMALOG) 100 UNIT/ML injection Inject 3-5 Units into the skin 3 (three) times daily before meals. 5 units before breakfast, before lunch and 3 units before supper      . metoprolol succinate (TOPROL-XL) 50 MG 24 hr tablet Take 75 mg by mouth 2 (two) times daily. Take with or immediately following a meal.      . nystatin cream (MYCOSTATIN) Apply 1 application topically every evening. To groin      . potassium chloride (K-DUR,KLOR-CON) 10 MEQ tablet Take 5-10 mEq by mouth 2 (two) times daily. Pt takes 10 meq with torsemide in the a.m. And 5 meq at night with the torsemide      . simvastatin (ZOCOR) 40 MG tablet Take 40 mg by mouth every evening.      . torsemide (DEMADEX) 20 MG tablet Take 20 mg by mouth 2 (two) times daily.      . traZODone (DESYREL) 50 MG tablet Take 25 mg by mouth at bedtime.      . Vitamins B1 B6 B12 LIQD Take 5 mLs by mouth daily at 12 noon.      . warfarin (COUMADIN) 3 MG tablet Take 2.5-3 mg by mouth daily. Patient takes 3 mg every day except one day on which he takes 2.5mg  (Daughter didn't know which day the 2.5mg  dose was)        Home: Home Living Lives With: Other (Comment) Available Help at Discharge: Personal care attendant;Available 24 hours/day Type of Home: House Home Access: Ramped entrance Home Layout: Two level;Able to live on main level with bedroom/bathroom Home Adaptive Equipment: Grab bars in shower;Walker - rolling;Tub transfer bench;Grab bars around toilet;Wheelchair - manual;Bedside commode/3-in-1 Additional Comments: couch and bed are on risers  Functional History: Prior Function Bath: Moderate Dressing: Moderate Meal Prep: Total Light Housekeeping: Total Able to Take Stairs?: No Driving: No Vocation: Retired Functional Status:  Mobility: Bed Mobility Bed Mobility: Not assessed Transfers Transfers: Not assessed Ambulation/Gait Ambulation/Gait Assistance: Not tested (comment)    ADL:     Cognition: Cognition Arousal/Alertness: Awake/alert Orientation Level: Oriented to person;Disoriented to place;Disoriented to time;Disoriented to situation Cognition Overall Cognitive Status: Impaired Area of Impairment: Attention Arousal/Alertness: Awake/alert Orientation Level: Disoriented X4 Behavior During Session: Flat affect Current Attention Level: Focused Cognition - Other Comments: Pt unable to rate or localize pain, inconsistently following commands  Blood pressure 118/55, pulse 96, temperature 98.6 F (37 C), temperature source Oral, resp. rate 18, height 5\' 7"  (1.702 m), weight 77.701 kg (171 lb 4.8 oz), SpO2 94.00%. Physical Exam  Vitals reviewed. HENT:  Head: Normocephalic.  Eyes: EOM are normal.  Neck: Neck supple. No thyromegaly present.  Cardiovascular:  Cardiac rate controlled  Pulmonary/Chest:  Decreased breath sounds at the bases but clear to auscultation  Abdominal: Bowel sounds are normal. He exhibits no distension.  Musculoskeletal:  Left leg AND RIGHT LEG movement limited by pain at left hip. In KI, wound dressed.  Neurological: He is alert.  Patient made good eye contact with examiner. He was able to answer basic questions and follow simple commands. He did  need some cues for medical history. RUE with apraxia, 2+ to 3+ strength. RLE is 3/5. Mild increase in tone noted in right arm and leg also. Speech dysarthric but intelligible. Fair insight and awareness  Skin:  Hip incision clean and dry. BKA well-healed    Results for orders placed during the hospital encounter of 02/22/13 (from the past 24 hour(s))  GLUCOSE, CAPILLARY     Status: Abnormal   Collection Time    02/26/13  4:23 PM      Result Value Range   Glucose-Capillary 263 (*) 70 - 99 mg/dL   Comment 1 Documented in Chart     Comment 2 Notify RN    HEPARIN LEVEL (UNFRACTIONATED)     Status: Abnormal   Collection Time    02/26/13  5:55 PM      Result Value Range   Heparin Unfractionated  <0.10 (*) 0.30 - 0.70 IU/mL  GLUCOSE, CAPILLARY     Status: Abnormal   Collection Time    02/26/13  7:59 PM      Result Value Range   Glucose-Capillary 385 (*) 70 - 99 mg/dL  GLUCOSE, CAPILLARY     Status: Abnormal   Collection Time    02/26/13 11:59 PM      Result Value Range   Glucose-Capillary 322 (*) 70 - 99 mg/dL   Comment 1 Notify RN    URINALYSIS, ROUTINE W REFLEX MICROSCOPIC     Status: Abnormal   Collection Time    02/27/13  3:50 AM      Result Value Range   Color, Urine AMBER (*) YELLOW   APPearance CLOUDY (*) CLEAR   Specific Gravity, Urine 1.020  1.005 - 1.030   pH 5.0  5.0 - 8.0   Glucose, UA NEGATIVE  NEGATIVE mg/dL   Hgb urine dipstick MODERATE (*) NEGATIVE   Bilirubin Urine NEGATIVE  NEGATIVE   Ketones, ur NEGATIVE  NEGATIVE mg/dL   Protein, ur NEGATIVE  NEGATIVE mg/dL   Urobilinogen, UA 1.0  0.0 - 1.0 mg/dL   Nitrite NEGATIVE  NEGATIVE   Leukocytes, UA LARGE (*) NEGATIVE  URINE MICROSCOPIC-ADD ON     Status: Abnormal   Collection Time    02/27/13  3:50 AM      Result Value Range   Squamous Epithelial / LPF RARE  RARE   WBC, UA 21-50  <3 WBC/hpf   RBC / HPF 3-6  <3 RBC/hpf   Bacteria, UA MANY (*) RARE   Casts HYALINE CASTS (*) NEGATIVE  GLUCOSE, CAPILLARY     Status: Abnormal   Collection Time    02/27/13  4:21 AM      Result Value Range   Glucose-Capillary 281 (*) 70 - 99 mg/dL  BASIC METABOLIC PANEL     Status: Abnormal   Collection Time    02/27/13  5:30 AM      Result Value Range   Sodium 134 (*) 135 - 145 mEq/L   Potassium 4.6  3.5 - 5.1 mEq/L   Chloride 102  96 - 112 mEq/L   CO2 20  19 - 32 mEq/L   Glucose, Bld 309 (*) 70 - 99 mg/dL   BUN 83 (*) 6 - 23 mg/dL   Creatinine, Ser 0.86 (*) 0.50 - 1.35 mg/dL   Calcium 7.7 (*) 8.4 - 10.5 mg/dL   GFR calc non Af Amer 19 (*) >90 mL/min   GFR calc Af Amer 22 (*) >90 mL/min  PROTIME-INR     Status: Abnormal  Collection Time    02/27/13  5:30 AM      Result Value Range   Prothrombin Time 28.2 (*)  11.6 - 15.2 seconds   INR 2.82 (*) 0.00 - 1.49  HEPARIN LEVEL (UNFRACTIONATED)     Status: Abnormal   Collection Time    02/27/13  5:30 AM      Result Value Range   Heparin Unfractionated 0.16 (*) 0.30 - 0.70 IU/mL  CBC     Status: Abnormal   Collection Time    02/27/13  5:30 AM      Result Value Range   WBC 13.0 (*) 4.0 - 10.5 K/uL   RBC 2.93 (*) 4.22 - 5.81 MIL/uL   Hemoglobin 7.7 (*) 13.0 - 17.0 g/dL   HCT 16.1 (*) 09.6 - 04.5 %   MCV 77.8 (*) 78.0 - 100.0 fL   MCH 26.3  26.0 - 34.0 pg   MCHC 33.8  30.0 - 36.0 g/dL   RDW 40.9 (*) 81.1 - 91.4 %   Platelets 169  150 - 400 K/uL  HEPATIC FUNCTION PANEL     Status: Abnormal   Collection Time    02/27/13  5:30 AM      Result Value Range   Total Protein 5.6 (*) 6.0 - 8.3 g/dL   Albumin 2.7 (*) 3.5 - 5.2 g/dL   AST 7829 (*) 0 - 37 U/L   ALT 944 (*) 0 - 53 U/L   Alkaline Phosphatase 68  39 - 117 U/L   Total Bilirubin 0.8  0.3 - 1.2 mg/dL   Bilirubin, Direct 0.3  0.0 - 0.3 mg/dL   Indirect Bilirubin 0.5  0.3 - 0.9 mg/dL  DIFFERENTIAL     Status: Abnormal   Collection Time    02/27/13  5:30 AM      Result Value Range   Neutrophils Relative 81 (*) 43 - 77 %   Neutro Abs 10.9 (*) 1.7 - 7.7 K/uL   Lymphocytes Relative 9 (*) 12 - 46 %   Lymphs Abs 1.2  0.7 - 4.0 K/uL   Monocytes Relative 6  3 - 12 %   Monocytes Absolute 0.8  0.1 - 1.0 K/uL   Eosinophils Relative 4  0 - 5 %   Eosinophils Absolute 0.5  0.0 - 0.7 K/uL   Basophils Relative 0  0 - 1 %   Basophils Absolute 0.1  0.0 - 0.1 K/uL  GLUCOSE, CAPILLARY     Status: Abnormal   Collection Time    02/27/13  8:34 AM      Result Value Range   Glucose-Capillary 238 (*) 70 - 99 mg/dL   Comment 1 Documented in Chart     Comment 2 Notify RN    PREPARE RBC (CROSSMATCH)     Status: None   Collection Time    02/27/13 10:01 AM      Result Value Range   Order Confirmation ORDER PROCESSED BY BLOOD BANK    TYPE AND SCREEN     Status: None   Collection Time    02/27/13 10:40 AM       Result Value Range   ABO/RH(D) A POS     Antibody Screen NEG     Sample Expiration 03/02/2013     Unit Number [id number]     Blood Component Type RED CELLS,LR     Unit division 00     Status of Unit ALLOCATED     Transfusion Status OK TO TRANSFUSE  Crossmatch Result Compatible     Unit Number [id number]     Blood Component Type RED CELLS,LR     Unit division 00     Status of Unit ISSUED     Transfusion Status OK TO TRANSFUSE     Crossmatch Result Compatible    GLUCOSE, CAPILLARY     Status: Abnormal   Collection Time    02/27/13 11:52 AM      Result Value Range   Glucose-Capillary 205 (*) 70 - 99 mg/dL   Comment 1 Documented in Chart     Comment 2 Notify RN     US Renal  02/26/2013  *RADIOLOGY REPORT*  Clinical Data: Acute renal failure  RENAL/URINARY TRACT ULTRASOUND COMPLETE  Comparison: Renal ultrasound 09/27/2012  Findings:  Right Kidney = 10.4 cm.  No evidence of hydronephrosis, cyst, mass, or stone.  Left kidney = 12.2 f cm.  No evidence of hydronephrosis, cyst, mass, or stone.  Bladder:  A Foley catheter in the bladder.  IMPRESSION:  No hydronephrosis.  No explanation for renal failure.   Original Report Authenticated By: Genevive Bi, M.D.    Dg Pelvis Portable  02/25/2013  *RADIOLOGY REPORT*  Clinical Data: Left hip prosthesis placement following a left femoral neck fracture.  PORTABLE PELVIS  Comparison: 02/22/2013.  Findings: Interval left hip prosthesis in satisfactory position and alignment.  No acute fracture or dislocation.  IMPRESSION: Satisfactory postoperative appearance of the left hip prosthesis.   Original Report Authenticated By: Beckie Salts, M.D.    Dg Chest Port 1 View  02/26/2013  *RADIOLOGY REPORT*  Clinical Data: Postop.  Evaluate for pneumonia  PORTABLE CHEST - 1 VIEW  Comparison: Chest radiograph 02/25/2013  Findings: The stable mildly enlarged heart silhouette.  Sternotomy wires and a valve replacement noted.  There is increased air space  density in the right upper lobe and right lower lobe.  No pneumothorax.  IMPRESSION: Increased air space densities in the right upper lobe and right lower lobe suggesting edema or infection.   Original Report Authenticated By: Genevive Bi, M.D.    Dg Chest Port 1 View  02/25/2013  *RADIOLOGY REPORT*  Clinical Data: Clinical concern for pneumonia.  No reason given.  PORTABLE CHEST - 1 VIEW  Comparison: 02/22/2013.  Findings: Interval small amount of ill-defined patchy opacity in the right upper lobe.  Interval mild increase in prominence of the interstitial markings, with Kerley lines on the right.  Mildly progressive enlargement of the cardiac silhouette.  Stable post CABG changes and prosthetic heart valve.  Stable thoracolumbar vertebroplasty material and diffuse osteopenia.  IMPRESSION:  1.  Interval mild changes of congestive heart failure with a small amount of alveolar edema or pneumonia in the right upper lobe. 2.  Mildly progressive cardiomegaly.   Original Report Authenticated By: Beckie Salts, M.D.     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 Does the need for close, 24 hr/day medical supervision in concert with the patient's rehab needs make it unreasonable for this patient to be served in a less intensive setting? Potentially 2. Co-Morbidities requiring supervision/potential complications: dm, afib, cabg, avr 3. Due to bladder management, bowel management, safety, skin/wound care, disease management, medication administration, pain management and patient education, does the patient require 24 hr/day rehab nursing? Potentially 4. Does the patient require coordinated care of a physician, rehab nurse, PT (1-2 hrs/day, 5 days/week) and OT (1-2 hrs/day, 5 days/week) to address physical and functional deficits in the context  of the above medical diagnosis(es)? Potentially Addressing deficits in the following areas: balance, endurance, locomotion, strength,  transferring, bathing, dressing and grooming 5. Can the patient actively participate in an intensive therapy program of at least 3 hrs of therapy per day at least 5 days per week? No and Potentially 6. The potential for patient to make measurable gains while on inpatient rehab is fair 7. Anticipated functional outcomes upon discharge from inpatient rehab are min to mod assist with PT, min to mod assist with OT, n/a with SLP. 8. Estimated rehab length of stay to reach the above functional goals is: ?2 weeks 9. Does the patient have adequate social supports to accommodate these discharge functional goals? Yes 10. Anticipated D/C setting: Home 11. Anticipated post D/C treatments: HH therapy 12. Overall Rehab/Functional Prognosis: fair  RECOMMENDATIONS: This patient's condition is appropriate for continued rehabilitative care in the following setting: [facility] Patient has agreed to participate in recommended program. Potentially Note that insurance prior authorization may be required for reimbursement for recommended care.  Comment: I have concerns over pain and activity tolerance. Not sure how many "acute" and "measurable" gains we would make on inpatient rehab. He already has a 24 hr CNA at home who can provide assistance as needed. Rehab RN to follow up.   Ranelle Oyster, MD, [place] [facility]     02/27/2013

## 2013-02-27 NOTE — Progress Notes (Signed)
PATIENT ID: Dewayne Hatch   2 Days Post-Op Procedure(s) (LRB): ARTHROPLASTY BIPOLAR HIP (Left)  Subjective: Still with pain in L hip, but doing well. Making progress daily.  Objective:  Filed Vitals:   02/27/13 0419  BP: 103/65  Pulse: 71  Temp: 97.9 F (36.6 C)  Resp: 18     Alert, NAD, pain with any movement of Lhip Dressing C/D/I  Labs:   Recent Labs  02/25/13 0900 02/26/13 0500 02/27/13 0530  HGB 9.9* 8.1* 7.7*   Recent Labs  02/26/13 0500 02/27/13 0530  WBC 13.6* 13.0*  RBC 3.07* 2.93*  HCT 23.8* 22.8*  PLT 175 169   Recent Labs  02/26/13 0500 02/27/13 0530  NA 137 134*  K 5.1 4.6  CL 102 102  CO2 24 20  BUN 77* 83*  CREATININE 2.28* 2.76*  GLUCOSE 330* 309*  CALCIUM 8.2* 7.7*    Assessment and Plan:S/p L hip hemi, ORIF GT fx NWB OOB to chair when able ABLA: Spoke with Dr. Susie Cassette yest, re transfusion.  Will defer to her re: need for transfusion given current renal issues and h/o CHF.   Will follow  VTE proph: Coumadin, INR now therapeutic

## 2013-02-27 NOTE — Consult Note (Signed)
[facility] - CONSULT NOTE Resident Note    Please see below for attending addendum to resident note.   Date: 02/27/2013                  Patient Name:  Aaron [name]  MRN: [id number]  DOB: 1924/04/01  Age / Sex: 79 y.o., male         PCP: Danella Penton.                 Referring Physician: [facility] Germain Osgood, MD)                 Reason for Consult: Acute Kidney Injury            History of Present Illness: Patient is a 79 y.o. male with a PMHx of Diabetes Mellitus insulin dependent since 1971, Peripheral Vascular Disease s/p Left BKA Nov 2013, hypertension, CAD s/p CABD and Aortic Valve Replacement 2008, CHFon torsemide therapy with EF 65-70% on ECHO Oct 2013, and atrial fibrillation on Warfarin therapy.  He was admitted to [facility] on 02/22/2013 for repair of left hip fracture after fall while using walker. Renal Service was consulted for evaluation and management of worsening kidney function since admission with increased creatinine from 1.69 on admission to 2.76 today.   Medications: Outpatient medications: Prescriptions prior to admission  Medication Sig Dispense Refill  . amLODipine (NORVASC) 5 MG tablet Take 5 mg by mouth every morning.      Marland Kitchen aspirin EC 81 MG EC tablet Take 1 tablet (81 mg total) by mouth daily.      . barrier cream (NON-SPECIFIED) CREA Apply 1 application topically at bedtime as needed (for bed sores).      . Bisacodyl (DULCOLAX PO) Take 1 tablet by mouth daily as needed (for constipation).      . Calcium Carbonate-Vitamin D (CALCIUM 600+D) 600-400 MG-UNIT per tablet Take 1 tablet by mouth every morning.       . Cholecalciferol (VITAMIN D-3) 1000 UNITS CAPS Take 1,000 Units by mouth every morning.       . Diphenhydramine-APAP, sleep, (TYLENOL PM EXTRA STRENGTH PO) Take 1-2 tablets by mouth at bedtime as needed (pt can take up to 2 tabs for sleep aide).      . DULoxetine (CYMBALTA) 30 MG capsule Take 30 mg by mouth at bedtime.       .  insulin glargine (LANTUS) 100 UNIT/ML injection Inject 30 Units into the skin daily before breakfast.      . insulin lispro (HUMALOG) 100 UNIT/ML injection Inject 3-5 Units into the skin 3 (three) times daily before meals. 5 units before breakfast, before lunch and 3 units before supper      . metoprolol succinate (TOPROL-XL) 50 MG 24 hr tablet Take 75 mg by mouth 2 (two) times daily. Take with or immediately following a meal.      . nystatin cream (MYCOSTATIN) Apply 1 application topically every evening. To groin      . potassium chloride (K-DUR,KLOR-CON) 10 MEQ tablet Take 5-10 mEq by mouth 2 (two) times daily. Pt takes 10 meq with torsemide in the a.m. And 5 meq at night with the torsemide      . simvastatin (ZOCOR) 40 MG tablet Take 40 mg by mouth every evening.      . torsemide (DEMADEX) 20 MG tablet Take 20 mg by mouth 2 (two) times daily.      . traZODone (DESYREL) 50 MG tablet Take 25 mg by mouth  at bedtime.      . Vitamins B1 B6 B12 LIQD Take 5 mLs by mouth daily at 12 noon.      . warfarin (COUMADIN) 2.5 MG tablet Take 2.5-3 mg by mouth every evening. 3 mg 6 days a week and 2.5 mg one day a week        Current medications: Current Facility-Administered Medications  Medication Dose Route Frequency Provider Last Rate Last Dose  . 0.9 %  sodium chloride infusion   Intravenous Continuous Richarda Overlie, MD 125 mL/hr at 02/27/13 0952    . acetaminophen (TYLENOL) tablet 650 mg  650 mg Oral Q6H PRN Jiles Harold, PA-C       Or  . acetaminophen (TYLENOL) suppository 650 mg  650 mg Rectal Q6H PRN Jiles Harold, PA-C      . alum & mag hydroxide-simeth (MAALOX/MYLANTA) 200-200-20 MG/5ML suspension 30 mL  30 mL Oral Q4H PRN Jiles Harold, PA-C      . aspirin EC tablet 81 mg  81 mg Oral Daily Houston Siren, MD   81 mg at 02/27/13 1056  . atorvastatin (LIPITOR) tablet 20 mg  20 mg Oral q1800 Richarda Overlie, MD   20 mg at 02/26/13 1739  . barrier cream (non-specified) 1 application  1  application Topical QHS PRN Houston Siren, MD      . bisacodyl (DULCOLAX) EC tablet 5 mg  5 mg Oral Daily PRN Houston Siren, MD      . chlorhexidine (HIBICLENS) 4 % liquid 4 application  60 mL Topical Once Mable Paris, MD      . diphenhydrAMINE (BENADRYL) injection 12.5 mg  12.5 mg Intravenous Q6H PRN Richarda Overlie, MD   12.5 mg at 02/24/13 2120  . docusate sodium (COLACE) capsule 100 mg  100 mg Oral BID Jiles Harold, PA-C   100 mg at 02/27/13 0933  . DULoxetine (CYMBALTA) DR capsule 30 mg  30 mg Oral QHS Houston Siren, MD   30 mg at 02/26/13 2130  . feeding supplement (GLUCERNA SHAKE) liquid 237 mL  237 mL Oral BID BM Ailene Ards, RD      . HYDROcodone-acetaminophen (NORCO/VICODIN) 5-325 MG per tablet 1-2 tablet  1-2 tablet Oral Q6H PRN Jiles Harold, PA-C      . HYDROmorphone (DILAUDID) injection 0.1 mg  0.1 mg Intravenous Q2H PRN Jiles Harold, PA-C   0.1 mg at 02/26/13 0836  . imipenem-cilastatin (PRIMAXIN) 250 mg in sodium chloride 0.9 % 100 mL IVPB  250 mg Intravenous Q12H Crystal Godley, [facility]      . insulin aspart (novoLOG) injection 0-15 Units  0-15 Units Subcutaneous Q4H Houston Siren, MD   5 Units at 02/27/13 [phone]  . [START ON 02/28/2013] insulin glargine (LANTUS) injection 30 Units  30 Units Subcutaneous QAC breakfast Richarda Overlie, MD      . lactated ringers infusion   Intravenous Continuous Rivka Barbara, MD      . menthol-cetylpyridinium (CEPACOL) lozenge 3 mg  1 lozenge Oral PRN Jiles Harold, PA-C       Or  . phenol (CHLORASEPTIC) mouth spray 1 spray  1 spray Mouth/Throat PRN Jiles Harold, PA-C      . metoCLOPramide (REGLAN) tablet 5-10 mg  5-10 mg Oral Q8H PRN Jiles Harold, PA-C       Or  . metoCLOPramide (REGLAN) injection 5-10 mg  5-10 mg Intravenous Q8H PRN Jiles Harold, PA-C   5 mg at 02/25/13 2332  . metoprolol succinate (TOPROL-XL) 24 hr tablet 12.5 mg  12.5 mg Oral BID Richarda Overlie, MD      . nystatin cream  (MYCOSTATIN) 1 application  1 application Topical QPM Houston Siren, MD   1 application at 02/23/13 1623  . ondansetron (ZOFRAN) tablet 4 mg  4 mg Oral Q6H PRN Houston Siren, MD       Or  . ondansetron [facility]) injection 4 mg  4 mg Intravenous Q6H PRN Houston Siren, MD      . oxyCODONE (Oxy IR/ROXICODONE) immediate release tablet 5-10 mg  5-10 mg Oral Q4H PRN Jiles Harold, PA-C   10 mg at 02/27/13 0321  . oxyCODONE-acetaminophen (PERCOCET/ROXICET) 5-325 MG per tablet 1 tablet  1 tablet Oral Q6H PRN Richarda Overlie, MD   1 tablet at 02/27/13 0743  . polyethylene glycol (MIRALAX / GLYCOLAX) packet 17 g  17 g Oral Daily PRN Jiles Harold, PA-C      . sodium chloride 0.9 % injection 3 mL  3 mL Intravenous Q12H Houston Siren, MD   3 mL at 02/27/13 0934  . sodium chloride 0.9 % injection 3 mL  3 mL Intravenous Q12H Houston Siren, MD   3 mL at 02/27/13 0934  . sodium chloride 0.9 % injection 3 mL  3 mL Intravenous PRN Houston Siren, MD   3 mL at 02/23/13 0127  . traZODone (DESYREL) tablet 25 mg  25 mg Oral QHS Jiles Harold, PA-C   25 mg at 02/26/13 2131  . vancomycin (VANCOCIN) 750 mg in sodium chloride 0.9 % 150 mL IVPB  750 mg Intravenous Q48H Crystal Ramsey, [facility]      . vancomycin (VANCOCIN) IVPB 1000 mg/200 mL premix  1,000 mg Intravenous Once [employer] [facility]      . Warfarin - Pharmacist Dosing Inpatient   Does not apply q1800 Riki Rusk, [facility]          Allergies: Allergies  Allergen Reactions  . Metformin And Related Hives  . Penicillins Hives and Swelling      Past Medical History: Past Medical History  Diagnosis Date  . Hypertension   . Diabetes mellitus   . CHF (congestive heart failure)   . Coronary artery disease   . Atrial fibrillation   . PVD (peripheral vascular disease)   . S/P CABG x 1 2008  . S/P aortic valve replacement with porcine valve 2008     Past Surgical History: Past Surgical History  Procedure Laterality Date  . Below knee leg amputation   07/2012  . Cataract extraction w/ intraocular lens implant    . Abdominal surgery    . Hernia repair      x5  . Back surgery      vertebroplasty  . Coronary artery bypass graft  2008  . Hip arthroplasty Left 02/25/2013    Procedure: ARTHROPLASTY BIPOLAR HIP;  Surgeon: Mable Paris, MD;  Location[facility] OR;  Service: Orthopedics;  Laterality: Left;     Family History: No family history on file.   Social History:  reports that he has quit smoking. He uses smokeless tobacco. He reports that he does not drink alcohol or use illicit drugs.   Review of Systems: Constitutional:  Denies fever, chills, fatigue prior to hospitalization  HEENT: Endorses some blurry vision  Respiratory: Denies SOB, DOE, cough, chest tightness, and wheezing.  Cardiovascular: Denies chest pain, palpitations and leg swelling.  Gastrointestinal: Denies nausea, vomiting, abdominal pain, diarrhea, constipation, blood in stool and abdominal distention.  Genitourinary: Denies dysuria, hematuria, flank pain and difficulty  urinating, endorses urinary frequency  Musculoskeletal: Uses left below knee prosthesis and uses walker, no hip pain prior to fall  Skin: Denies rash and wounds  Neurological: Denies dizziness, seizures, syncope, Denies confusion  Hematological: Endorses easy bruising while on Coumadin     Vital Signs: Blood pressure 103/65, pulse 71, temperature 97.9 F (36.6 C), temperature source Oral, resp. rate 18, height 5\' 7"  (1.702 m), weight 171 lb 4.8 oz (77.701 kg), SpO2 95.00%.  Weight trends: Filed Weights   02/23/13 0110 02/27/13 0500  Weight: 150 lb 4.8 oz (68.176 kg) 171 lb 4.8 oz (77.701 kg)    Physical Exam: General: Elderly White male,  Well-developed, well-nourished, in no acute distress;  appropriate and cooperative throughout examination.  Head: Normocephalic, atraumatic, nasal canula in place  Throat: Poor dentition, Oropharynx nonerythematous although tongue has red coloring  from pill received, mucus membranes appears mildly dry  Neck: Supple, No carotid Bruits, no JVD appreciated  Lungs:  Normal respiratory effort. Clear to auscultation BL without crackles or wheezes.  Heart: RRR. S1 and S2 normal without gallop, murmur, or rubs.  Abdomen/GU BS normoactive. Soft, Nondistended, non-tender, foley in plce but no urine outpt  Extremities: Trace edema to right LE, left stump in brace  Neurologic: Initially sleeping but easily arousable, oriented, appropriate and cooperative during exam  Skin: Multiple ecchymosis over bilateral upper extremities, appropriate skin turgor    Lab results: Basic Metabolic Panel:  Recent Labs Lab 02/25/13 1203 02/26/13 0500 02/27/13 0530  NA 133* 137 134*  K 5.1 5.1 4.6  CL 98 102 102  CO2 18* 24 20  GLUCOSE 347* 330* 309*  BUN 64* 77* 83*  CREATININE 1.65* 2.28* 2.76*  CALCIUM 8.6 8.2* 7.7*    Liver Function Tests:  Recent Labs Lab 02/22/13 2203 02/25/13 1203 02/27/13 0530  AST 24 17 1051*  ALT 20 13 944*  ALKPHOS 66 66 68  BILITOT 0.4 0.9 0.8  PROT 7.4 6.5 5.6*  ALBUMIN 3.6 2.9* 2.7*      CBC:  Recent Labs Lab 02/22/13 2203 02/24/13 0625 02/25/13 0900 02/26/13 0500 02/27/13 0530  WBC 19.7* 17.4* 16.3* 13.6* 13.0*  NEUTROABS 17.8*  --   --   --   --   HGB 11.7* 10.0* 9.9* 8.1* 7.7*  HCT 36.0* 30.5* 30.3* 23.8* 22.8*  MCV 79.8 79.0 79.3 77.5* 77.8*  PLT 260 210 205 175 169    Cardiac Enzymes:  Recent Labs Lab 02/22/13 2203  TROPONINI <0.30     CBG:  Recent Labs Lab 02/26/13 1623 02/26/13 1959 02/26/13 2359 02/27/13 0421 02/27/13 0834  GLUCAP 263* 385* 322* 281* 238*    Microbiology: Results for orders placed during the hospital encounter of 02/22/13  SURGICAL PCR SCREEN     Status: None   Collection Time    02/23/13  5:15 AM      Result Value Range Status   MRSA, PCR NEGATIVE  NEGATIVE Final   Staphylococcus aureus NEGATIVE  NEGATIVE Final   Comment:            The Xpert  SA Assay (FDA     approved for NASAL specimens     in patients over 2 years of age),     is one component of     a comprehensive surveillance     program.  Test performance has     been validated by [employer] for patients greater     than or equal to 1 year  old.     It is not intended     to diagnose infection nor to     guide or monitor treatment.    Coagulation Studies:  Recent Labs  02/25/13 0900 02/26/13 0500 02/27/13 0530  LABPROT 16.9* 20.1* 28.2*  INR 1.41 1.78* 2.82*    Urinalysis:  Recent Labs  02/27/13 0350  COLORURINE AMBER*  LABSPEC 1.020  PHURINE 5.0  GLUCOSEU NEGATIVE  HGBUR MODERATE*  BILIRUBINUR NEGATIVE  KETONESUR NEGATIVE  PROTEINUR NEGATIVE  UROBILINOGEN 1.0  NITRITE NEGATIVE  LEUKOCYTESUR LARGE*      Imaging: US Renal  02/26/2013  *RADIOLOGY REPORT*  Clinical Data: Acute renal failure  RENAL/URINARY TRACT ULTRASOUND COMPLETE  Comparison: Renal ultrasound 09/27/2012  Findings:  Right Kidney = 10.4 cm.  No evidence of hydronephrosis, cyst, mass, or stone.  Left kidney = 12.2 f cm.  No evidence of hydronephrosis, cyst, mass, or stone.  Bladder:  A Foley catheter in the bladder.  IMPRESSION:  No hydronephrosis.  No explanation for renal failure.   Original Report Authenticated By: Genevive Bi, M.D.    Dg Pelvis Portable  02/25/2013  *RADIOLOGY REPORT*  Clinical Data: Left hip prosthesis placement following a left femoral neck fracture.  PORTABLE PELVIS  Comparison: 02/22/2013.  Findings: Interval left hip prosthesis in satisfactory position and alignment.  No acute fracture or dislocation.  IMPRESSION: Satisfactory postoperative appearance of the left hip prosthesis.   Original Report Authenticated By: Beckie Salts, M.D.    Dg Chest Port 1 View  02/26/2013  *RADIOLOGY REPORT*  Clinical Data: Postop.  Evaluate for pneumonia  PORTABLE CHEST - 1 VIEW  Comparison: Chest radiograph 02/25/2013  Findings: The stable mildly enlarged heart  silhouette.  Sternotomy wires and a valve replacement noted.  There is increased air space density in the right upper lobe and right lower lobe.  No pneumothorax.  IMPRESSION: Increased air space densities in the right upper lobe and right lower lobe suggesting edema or infection.   Original Report Authenticated By: Genevive Bi, M.D.    Dg Chest Port 1 View  02/25/2013  *RADIOLOGY REPORT*  Clinical Data: Clinical concern for pneumonia.  No reason given.  PORTABLE CHEST - 1 VIEW  Comparison: 02/22/2013.  Findings: Interval small amount of ill-defined patchy opacity in the right upper lobe.  Interval mild increase in prominence of the interstitial markings, with Kerley lines on the right.  Mildly progressive enlargement of the cardiac silhouette.  Stable post CABG changes and prosthetic heart valve.  Stable thoracolumbar vertebroplasty material and diffuse osteopenia.  IMPRESSION:  1.  Interval mild changes of congestive heart failure with a small amount of alveolar edema or pneumonia in the right upper lobe. 2.  Mildly progressive cardiomegaly.   Original Report Authenticated By: Beckie Salts, M.D.      Assessment & Plan:  1. Acute Kidney Injury: oliguric, with creatinine 1.69 on admission now elevated at 2.76 (baseline 1.18-1.43 over past several months), likely multifactorial given his age, long standing diabetes, hypertension and peripheral vascular disease pt likely has underlying renal disease leading to  ATN in setting of hypotension (sbp 90s) and NSAID use.  Pt received a dose of Toradol 3/8, now on Vancomycin and Primaxin. Torsemide was held on admission but pt had been taking daily Aleve in addition to his daily torsemide. Renal US unremarkable w/o hydronephrosis. No metabolic acidosis evident, slightly hyponatremic with electrolytes within normal limits (Corrected calcium 8.7 with albumin 7.7). Pt is anemic with Hgb trending down in setting of hypovolemia,  Urine Output only ~500 cc over past 24  hours. UA with many bacteria and hyaline cast, no rbcs or proteinuria to suggest severe intrinsic renal disease.  -cont IV fluids currently at 125cc/h NS -check urine electrolytes to assess prerenal azotemia. -monitor daily BMET -avoid contrast -reassess fluid status after transfusion pRBCS--> may require diuretic challenge with Lasix to jump start urine output   Recent Labs Lab 02/22/13 2203 02/24/13 0625 02/25/13 1203 02/26/13 0500 02/27/13 0530  CREATININE 1.69* 2.02* 1.65* 2.28* 2.76*   CBC    Component Value Date/Time   WBC 13.0* 02/27/2013 0530   RBC 2.93* 02/27/2013 0530   HGB 7.7* 02/27/2013 0530   HCT 22.8* 02/27/2013 0530   PLT 169 02/27/2013 0530   MCV 77.8* 02/27/2013 0530   MCH 26.3 02/27/2013 0530   MCHC 33.8 02/27/2013 0530   RDW 17.4* 02/27/2013 0530   LYMPHSABS 1.2 02/27/2013 0530   MONOABS 0.8 02/27/2013 0530   EOSABS 0.5 02/27/2013 0530   BASOSABS 0.1 02/27/2013 0530     2. Anemia: 4 gram decrease as noted above, no blood in stools, no hematoma evident -pt to be transfused per primary team   Recent Labs Lab 02/22/13 2203 02/24/13 0625 02/25/13 0900 02/26/13 0500 02/27/13 0530  HGB 11.7* 10.0* 9.9* 8.1* 7.7*     3. Diabetes Mellitus: on insulin, management per primary team CBG (last 3)   Recent Labs  02/27/13 0421 02/27/13 0834 02/27/13 1152  GLUCAP 281* 238* 205*   4. Left hip greater trochanter fracture s/p ORIF:  ~200 cc blood loss 02/22/2013; managed per Orthopedics   5. Code Status: DNR  Patient history and plan of care reviewed with attending, Dr. Allena Katz.   Manuela Schwartz, MD  PGYII, Internal Medicine Resident 02/27/2013, 11:15 AM  I have personally seen and examined this patient and agree with the assessment/plan as outlined above by Bosie Clos MD (PGY2). Mr Monger has ARF on CKD3 likely from hypotension (pneumonia with ?SIRS and anemia) and resulting ischemic ATN. Agree with cautious IV saline and 2 units PRBCs +/- diuretic  coverage. There is no acute HD needs at present but the renal prognosis is currently guarded with oligo-anuric UOP. Agree with strict I&Os and avoiding nephrotoxins-- renal US negative for obstruction. PATEL,JAY K.,MD 02/27/2013 2:34 PM

## 2013-02-27 NOTE — Progress Notes (Signed)
[facility] PROGRESS NOTE  Aaron [name] VHQ:[id number] DOB: 1924/11/24 DOA: 02/22/2013 PCP: Danella Penton., MD  Assessment/Plan: Principal Problem:   Hip fracture, left Active Problems:   Diabetes mellitus   A-fib   S/P CABG x 1   Aortic valve replaced   DNR (do not resuscitate)    1. Hip fracture-on anticoagulation, discontinue HEPARIN AS INR IS THERAPEUTIC AND COUMADIN  2. Atrial fibrillation /aortic valve replacement with a porcine valveon Coumadin, start heparin drip when INR less than 2 without a bolus, decreased metoprolol because of low blood pressure 3. Prior history of CVA in the setting of subtherapeutic INR 4. Diabetes mellitus continue sliding scale insulin, CBG elevated, increase Lantus to 40 5. Acute on chronic kidney injury creatinine is gone up from 1.69-2.7, to baseline of about 1.05 we'll start the patient on IV hydration, discontinue Toradol, renal USG negative, UA requested multiple times but not collected . Request a nephrology consult 6. Hyperkalemia, dehydration recheck labs today 7. Fever , possible PNA RUL , WILL REPEAT CXR , started vancomycin anemia  8. Anemia hemoglobin trending down, in the setting of renal insufficiency and low blood pressure, will transfuse    Code Status: DO NOT RESUSCITATE  Family Communication: family updated about patient's clinical progress  Disposition Plan: SNF   Brief narrative:  Aaron [name] is an 79 y.o. 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 His son in law was there and witnessed this mechanical fall. Evaluation in emergency room included a head CT  which showed no acute process. At the time of his hip showed left femoral neck fracture with mild displacement. His INR is 2.23, white count 19.7 thousand, hemoglobin of 11.7. His creatinine is 1.7 with BUN of 61. Orthopedics was consulted by [facility] and has seen the patient. Hospitalist was asked to admit him for hip fracture  Consultants:  Mable Paris, MD  Procedures:  none Antibiotics:  none   Reports lying comfortably. No events overnight. Eating this am without problems. No complaints or concerns       Objective: Filed Vitals:   02/26/13 1330 02/26/13 2031 02/27/13 0419 02/27/13 0500  BP: 106/67 95/56 103/65   Pulse: 62 75 71   Temp: 97.4 F (36.3 C) 97.5 F (36.4 C) 97.9 F (36.6 C)   TempSrc: Axillary Oral Oral   Resp: 18 16 18    Height:      Weight:    77.701 kg (171 lb 4.8 oz)  SpO2: 98% 98% 95%     Intake/Output Summary (Last 24 hours) at 02/27/13 0959 Last data filed at 02/27/13 0537  Gross per 24 hour  Intake 2646.47 ml  Output    200 ml  Net 2446.47 ml    Exam:  HENT:  Head: Atraumatic.  Nose: Nose normal.  Mouth/Throat: Oropharynx is clear and moist.  Eyes: Conjunctivae are normal. Pupils are equal, round, and reactive to light. No scleral icterus.  Neck: Neck supple. No tracheal deviation present.  Cardiovascular: Normal rate, regular rhythm, normal heart sounds and intact distal pulses.  Pulmonary/Chest: Effort normal and breath sounds normal. No respiratory distress.  Abdominal: Soft. Normal appearance and bowel sounds are normal. She exhibits no distension. There is no tenderness.  Musculoskeletal: She exhibits no edema and no tenderness.  Neurological: She is alert. No cranial nerve deficit.    Data Reviewed: Basic Metabolic Panel:  Recent Labs Lab 02/22/13 2203 02/24/13 0625 02/25/13 1203 02/26/13 0500 02/27/13 0530  NA 138 135 133* 137 134*  K 4.4 5.2* 5.1 5.1 4.6  CL 100 98 98 102 102  CO2 23 23 18* 24 20  GLUCOSE 145*  268* 347* 330* 309*  BUN 61* 65* 64* 77* 83*  CREATININE 1.69* 2.02* 1.65* 2.28* 2.76*  CALCIUM 9.3 8.7 8.6 8.2* 7.7*    Liver Function Tests:  Recent Labs Lab 02/22/13 2203 02/25/13 1203  AST 24 17  ALT 20 13  ALKPHOS 66 66  BILITOT 0.4 0.9  PROT 7.4 6.5  ALBUMIN 3.6 2.9*   No results found for this basename: LIPASE, AMYLASE,  in the last 168 hours No results found for this basename: AMMONIA,  in the last 168 hours  CBC:  Recent Labs Lab 02/22/13 2203 02/24/13 0625 02/25/13 0900 02/26/13 0500 02/27/13 0530  WBC 19.7* 17.4* 16.3* 13.6* 13.0*  NEUTROABS 17.8*  --   --   --   --   HGB 11.7* 10.0* 9.9* 8.1* 7.7*  HCT 36.0* 30.5* 30.3* 23.8* 22.8*  MCV 79.8 79.0 79.3 77.5* 77.8*  PLT 260 210 205 175 169    Cardiac Enzymes:  Recent Labs Lab 02/22/13 2203  TROPONINI <0.30   BNP (last 3 results)  Recent Labs  09/30/12 1233  PROBNP 29199.0*     CBG:  Recent Labs Lab 02/26/13 1623 02/26/13 1959 02/26/13 2359 02/27/13 0421 02/27/13 0834  GLUCAP 263* 385* 322* 281* 238*    Recent Results (from the past 240 hour(s))  SURGICAL PCR SCREEN     Status: None   Collection Time    02/23/13  5:15 AM      Result Value Range Status   MRSA, PCR NEGATIVE  NEGATIVE Final   Staphylococcus aureus NEGATIVE  NEGATIVE Final   Comment:            The Xpert SA Assay (FDA     approved for NASAL specimens     in patients over 3 years of age),     is one component of     a comprehensive surveillance     program.  Test performance has     been validated by [employer] for patients greater     than or equal to 52 year old.     It is not intended     to diagnose infection nor to     guide or monitor treatment.     Studies: Dg Chest 1 View  02/22/2013  *RADIOLOGY REPORT*  Clinical Data: Status post fall; concern for chest injury.  CHEST - 1 VIEW  Comparison: Chest radiograph performed 10/02/2012, and CT of the thoracic spine performed 10/02/2012  Findings: The  lungs are well-aerated.  Mild vascular congestion is noted, without significant pulmonary edema.  There is no evidence of focal opacification, pleural effusion or pneumothorax.  The cardiomediastinal silhouette is borderline normal in size.  An aortic valve replacement is noted.  The patient is status post median sternotomy, with evidence of prior CABG.  No acute osseous abnormalities are seen.  IMPRESSION: Mild vascular congestion noted; lungs remain grossly clear.  No displaced rib fractures seen.   Original Report Authenticated By: Tonia Ghent, M.D.    Dg Hip Complete Left  02/22/2013  *RADIOLOGY REPORT*  Clinical Data: Status post fall; left hip pain.  LEFT HIP - COMPLETE 2+ VIEW  Comparison: None.  Findings: There appears to be a mildly displaced basicervical fracture through the left femoral neck; the fracture line is difficult to fully characterize, but there is abnormal alignment of the proximal left femur.  The right hip joint is grossly unremarkable in appearance.  Mild degenerative change is noted at the lower lumbar spine.  The sacroiliac joints are grossly unremarkable in appearance.  Diffuse vascular calcifications are seen.  The visualized bowel gas pattern is grossly unremarkable.  IMPRESSION:  1.  Mildly displaced basicervical fracture through the left femoral neck. 2.  Diffuse vascular calcifications seen.   Original Report Authenticated By: Tonia Ghent, M.D.    Ct Head Wo Contrast  02/22/2013  *RADIOLOGY REPORT*  Clinical Data: Fall with hip pain.  CT HEAD WITHOUT CONTRAST  Technique:  Contiguous axial images were obtained from the base of the skull through the vertex without contrast.  Comparison: 09/27/2012  Findings: Bone windows demonstrate mucosal thickening of the left maxillary sinus. Clear mastoid air cells.  Soft tissue windows demonstrate expected cerebral atrophy.  Mild low density in the periventricular white matter likely related to small vessel disease.Posterior left frontal  lobe nonacute infarct, including on image 22/series 2. No  mass lesion, hemorrhage, hydrocephalus, acute infarct, intra-axial, or extra-axial fluid collection.  IMPRESSION:  1. No acute intracranial abnormality. 2.  Cerebral atrophy with remote left posterior frontal lobe infarct.   Original Report Authenticated By: Jeronimo Greaves, M.D.    US Renal  02/26/2013  *RADIOLOGY REPORT*  Clinical Data: Acute renal failure  RENAL/URINARY TRACT ULTRASOUND COMPLETE  Comparison: Renal ultrasound 09/27/2012  Findings:  Right Kidney = 10.4 cm.  No evidence of hydronephrosis, cyst, mass, or stone.  Left kidney = 12.2 f cm.  No evidence of hydronephrosis, cyst, mass, or stone.  Bladder:  A Foley catheter in the bladder.  IMPRESSION:  No hydronephrosis.  No explanation for renal failure.   Original Report Authenticated By: Genevive Bi, M.D.    Dg Pelvis Portable  02/25/2013  *RADIOLOGY REPORT*  Clinical Data: Left hip prosthesis placement following a left femoral neck fracture.  PORTABLE PELVIS  Comparison: 02/22/2013.  Findings: Interval left hip prosthesis in satisfactory position and alignment.  No acute fracture or dislocation.  IMPRESSION: Satisfactory postoperative appearance of the left hip prosthesis.   Original Report Authenticated By: Beckie Salts, M.D.    Dg Chest Port 1 View  02/26/2013  *RADIOLOGY REPORT*  Clinical Data: Postop.  Evaluate for pneumonia  PORTABLE CHEST - 1 VIEW  Comparison: Chest radiograph 02/25/2013  Findings: The stable mildly enlarged heart silhouette.  Sternotomy wires and a valve replacement noted.  There is increased air space density in the right upper lobe and right lower lobe.  No pneumothorax.  IMPRESSION: Increased air space densities in the right upper lobe and right lower lobe suggesting edema or infection.   Original Report Authenticated By: Genevive Bi, M.D.    Dg Chest Port 1 View  02/25/2013  *RADIOLOGY REPORT*  Clinical Data: Clinical concern for pneumonia.  No reason  given.  PORTABLE CHEST - 1 VIEW  Comparison: 02/22/2013.  Findings: Interval small amount of ill-defined patchy opacity in the right upper lobe.  Interval mild increase in prominence of the interstitial markings, with Kerley lines on the right.  Mildly progressive enlargement of the cardiac silhouette.  Stable post CABG changes and prosthetic heart valve.  Stable thoracolumbar vertebroplasty material and diffuse  osteopenia.  IMPRESSION:  1.  Interval mild changes of congestive heart failure with a small amount of alveolar edema or pneumonia in the right upper lobe. 2.  Mildly progressive cardiomegaly.   Original Report Authenticated By: Beckie Salts, M.D.     Scheduled Meds: . aspirin EC  81 mg Oral Daily  . atorvastatin  20 mg Oral q1800  . chlorhexidine  60 mL Topical Once  . docusate sodium  100 mg Oral BID  . DULoxetine  30 mg Oral QHS  . imipenem-cilastatin  250 mg Intravenous Q12H  . insulin aspart  0-15 Units Subcutaneous Q4H  . insulin glargine  30 Units Subcutaneous QAC breakfast  . metoprolol succinate  12.5 mg Oral BID  . nystatin cream  1 application Topical QPM  . sodium chloride  3 mL Intravenous Q12H  . sodium chloride  3 mL Intravenous Q12H  . traZODone  25 mg Oral QHS  . vancomycin  750 mg Intravenous Q48H  . vancomycin  1,000 mg Intravenous Once  . Warfarin - Pharmacist Dosing Inpatient   Does not apply q1800   Continuous Infusions: . sodium chloride 125 mL/hr at 02/27/13 0952  . lactated ringers      Principal Problem:   Hip fracture, left Active Problems:   Diabetes mellitus   A-fib   S/P CABG x 1   Aortic valve replaced   DNR (do not resuscitate)    Time spent: 40 minutes   [facility]  [facility] Pager [phone]. If 8PM-8AM, please contact night-coverage at [email or web address], password [facility] 02/27/2013, 9:59 AM  LOS: 5 days

## 2013-02-27 NOTE — Progress Notes (Signed)
ANTICOAGULATION CONSULT NOTE  Pharmacy Consult for Heparin/Coumadin  Indication: atrial fibrillation  Allergies  Allergen Reactions  . Metformin And Related Hives  . Penicillins Hives and Swelling    Patient Measurements: Height: 5\' 7"  (170.2 cm) Weight: 171 lb 4.8 oz (77.701 kg) IBW/kg (Calculated) : 66.1 Heparin Dosing Weight: 68.2kg   Vital Signs: Temp: 97.9 F (36.6 C) (03/12 0419) Temp src: Oral (03/12 0419) BP: 103/65 mmHg (03/12 0419) Pulse Rate: 71 (03/12 0419)  Labs:  Recent Labs  02/24/13 0625  02/25/13 0900 02/25/13 1203 02/26/13 0500 02/26/13 1755 02/27/13 0530  HGB 10.0*  --  9.9*  --  8.1*  --  7.7*  HCT 30.5*  --  30.3*  --  23.8*  --  22.8*  PLT 210  --  205  --  175  --  169  LABPROT 25.5*  < > 16.9*  --  20.1*  --  28.2*  INR 2.46*  < > 1.41  --  1.78*  --  2.82*  HEPARINUNFRC  --   --   --   --   --  <0.10* 0.16*  CREATININE 2.02*  --   --  1.65* 2.28*  --   --   < > = values in this interval not displayed.  Estimated Creatinine Clearance: 21.7 ml/min (by C-G formula based on Cr of 2.28).  Assessment: 79 yo male with atrial fibrillation s/p hip surgery 3/10, for anticoagulation.  INR now therapeutic    Goal of Therapy:  INR 2-3 Monitor platelets by anticoagulation protocol: Yes   Plan:  D/C heparin as INR > 2 Coumadin 2 mg tonight  Geannie Risen, PharmD, BCPS

## 2013-02-28 DIAGNOSIS — S72009A Fracture of unspecified part of neck of unspecified femur, initial encounter for closed fracture: Secondary | ICD-10-CM

## 2013-02-28 DIAGNOSIS — W19XXXA Unspecified fall, initial encounter: Secondary | ICD-10-CM

## 2013-02-28 LAB — CBC
Hemoglobin: 10.5 g/dL — ABNORMAL LOW (ref 13.0–17.0)
MCH: 27.3 pg (ref 26.0–34.0)
MCHC: 34.2 g/dL (ref 30.0–36.0)
Platelets: 148 10*3/uL — ABNORMAL LOW (ref 150–400)
RDW: 17.1 % — ABNORMAL HIGH (ref 11.5–15.5)

## 2013-02-28 LAB — TYPE AND SCREEN
ABO/RH(D): A POS
Antibody Screen: NEGATIVE
Unit division: 0
Unit division: 0

## 2013-02-28 LAB — COMPREHENSIVE METABOLIC PANEL
ALT: 950 U/L — ABNORMAL HIGH (ref 0–53)
AST: 568 U/L — ABNORMAL HIGH (ref 0–37)
Alkaline Phosphatase: 79 U/L (ref 39–117)
CO2: 23 mEq/L (ref 19–32)
Calcium: 8 mg/dL — ABNORMAL LOW (ref 8.4–10.5)
Chloride: 106 mEq/L (ref 96–112)
GFR calc Af Amer: 31 mL/min — ABNORMAL LOW (ref >90)
GFR calc non Af Amer: 27 mL/min — ABNORMAL LOW (ref >90)
Glucose, Bld: 205 mg/dL — ABNORMAL HIGH (ref 70–99)
Sodium: 137 mEq/L (ref 135–145)
Total Bilirubin: 1 mg/dL (ref 0.3–1.2)

## 2013-02-28 LAB — URINE CULTURE
Colony Count: NO GROWTH
Culture: NO GROWTH

## 2013-02-28 LAB — GLUCOSE, CAPILLARY: Glucose-Capillary: 259 mg/dL — ABNORMAL HIGH (ref 70–99)

## 2013-02-28 MED ORDER — FUROSEMIDE 10 MG/ML IJ SOLN
40.0000 mg | Freq: Once | INTRAMUSCULAR | Status: AC
  Administered 2013-03-01: 40 mg via INTRAVENOUS
  Filled 2013-02-28: qty 4

## 2013-02-28 MED ORDER — FUROSEMIDE 10 MG/ML IJ SOLN
40.0000 mg | Freq: Once | INTRAMUSCULAR | Status: AC
  Administered 2013-02-28: 40 mg via INTRAVENOUS
  Filled 2013-02-28: qty 4

## 2013-02-28 MED ORDER — STARCH (THICKENING) PO POWD
ORAL | Status: DC | PRN
  Filled 2013-02-28: qty 227

## 2013-02-28 NOTE — Progress Notes (Signed)
CRITICAL VALUE ALERT  Critical value received:  INR 5.10   Date of notification: 02/28/2013   Time of notification: 0930  Critical value read back yes  Nurse who received alert:  Flippin, Blanchard Kelch  MD notified (1st page):  Abrol   Time of first page:  0930   MD notified (2nd page): Abrol (pharmacy also already aware)  Time of second page: 1015   Responding MD:  Susie Cassette   Time MD responded:  1015

## 2013-02-28 NOTE — Progress Notes (Signed)
[facility] - PROGRESS NOTE Resident Note   Please see below for attending addendum to resident note.  Subjective:   No acute events overnight, tolerated transfusion, multiple PVCs on telemetry, no CP/no SOB, daughter at bedside  Objective:    Vital Signs:   Temp:  [97.8 F (36.6 C)-98.8 F (37.1 C)] 98.3 F (36.8 C) (03/13 0403) Pulse Rate:  [81-97] 95 (03/13 0403) Resp:  [16-18] 17 (03/13 0403) BP: (105-138)/(55-76) 133/76 mmHg (03/13 0403) SpO2:  [94 %-97 %] 97 % (03/13 0403) Last BM Date: 02/22/13  24-hour weight change: Weight change:   Weight trends: Filed Weights   02/23/13 0110 02/27/13 0500  Weight: 68.176 kg (150 lb 4.8 oz) 77.701 kg (171 lb 4.8 oz)    Intake/Output:  03/12 0701 - 03/13 0700 In: 3182.9 [P.O.:360; I.V.:2122.9; Blood:700] Out: 2200 [Urine:2200]  Physical Exam: General: Elderly White male, Well-developed, well-nourished, in no acute distress; alert, appropriate and cooperative throughout examination.  HEENT: NCAT, mucus membrane moist  Lungs:  Normal respiratory effort. Clear to auscultation BL without crackles or wheezes appreciated  Heart: IRRR. S1 and S2 normal without gallop, murmur, or rubs.  Abdomen:  BS normoactive. Soft, Nondistended, non-tender.  No masses or organomegaly.  Extremities: No pretibial edema R LE, L stump with brace in place inferior aspect w/o open drainage, wound or erythema     Labs: Basic Metabolic Panel:  Recent Labs Lab 02/26/13 0500 02/27/13 0530 02/28/13 0530  NA 137 134* 137  K 5.1 4.6 4.4  CL 102 102 106  CO2 24 20 23   GLUCOSE 330* 309* 205*  BUN 77* 83* 66*  CREATININE 2.28* 2.76* 2.07*  CALCIUM 8.2* 7.7* 8.0*    Liver Function Tests:  Recent Labs Lab 02/25/13 1203 02/27/13 0530 02/28/13 0530  AST 17 1051* 568*  ALT 13 944* 950*  ALKPHOS 66 68 79  BILITOT 0.9 0.8 1.0  PROT 6.5 5.6* 5.8*  ALBUMIN 2.9* 2.7* 2.7*    CBC:  Recent Labs Lab 02/22/13 2203   02/26/13 0500 02/27/13 0530 02/28/13 0530  WBC 19.7*  < > 13.6* 13.0* 10.7*  NEUTROABS 17.8*  --   --  10.9*  --   HGB 11.7*  < > 8.1* 7.7* 10.5*  HCT 36.0*  < > 23.8* 22.8* 30.7*  MCV 79.8  < > 77.5* 77.8* 79.9  PLT 260  < > 175 169 148*  < > = values in this interval not displayed.  Cardiac Enzymes:  Recent Labs Lab 02/22/13 2203  TROPONINI <0.30    CBG:  Recent Labs Lab 02/27/13 1643 02/27/13 2025 02/28/13 0004 02/28/13 0405 02/28/13 0800  GLUCAP 193* 242* 197* 187* 246*    Microbiology: Results for orders placed during the hospital encounter of 02/22/13  SURGICAL PCR SCREEN     Status: None   Collection Time    02/23/13  5:15 AM      Result Value Range Status   MRSA, PCR NEGATIVE  NEGATIVE Final   Staphylococcus aureus NEGATIVE  NEGATIVE Final   Comment:            The Xpert SA Assay (FDA     approved for NASAL specimens     in patients over 65 years of age),     is one component of     a comprehensive surveillance     program.  Test performance has     been validated by [employer] for patients greater  than or equal to 75 year old.     It is not intended     to diagnose infection nor to     guide or monitor treatment.  URINE CULTURE     Status: None   Collection Time    02/27/13  3:50 AM      Result Value Range Status   Specimen Description URINE, CATHETERIZED   Final   Special Requests NONE   Final   Culture  Setup Time 02/27/2013 03:50   Final   Colony Count NO GROWTH   Final   Culture NO GROWTH   Final   Report Status 02/28/2013 FINAL   Final    Coagulation Studies:  Recent Labs  02/26/13 0500 02/27/13 0530 02/28/13 0530  LABPROT 20.1* 28.2* 43.8*  INR 1.78* 2.82* 5.10*    Urinalysis:    Component Value Date/Time   COLORURINE AMBER* 02/27/2013 0350   APPEARANCEUR CLOUDY* 02/27/2013 0350   LABSPEC 1.020 02/27/2013 0350   PHURINE 5.0 02/27/2013 0350   GLUCOSEU NEGATIVE 02/27/2013 0350   HGBUR MODERATE* 02/27/2013 0350    BILIRUBINUR NEGATIVE 02/27/2013 0350   KETONESUR NEGATIVE 02/27/2013 0350   PROTEINUR NEGATIVE 02/27/2013 0350   UROBILINOGEN 1.0 02/27/2013 0350   NITRITE NEGATIVE 02/27/2013 0350   LEUKOCYTESUR LARGE* 02/27/2013 0350     Imaging: US Renal  02/26/2013  *RADIOLOGY REPORT*  Clinical Data: Acute renal failure  RENAL/URINARY TRACT ULTRASOUND COMPLETE  Comparison: Renal ultrasound 09/27/2012  Findings:  Right Kidney = 10.4 cm.  No evidence of hydronephrosis, cyst, mass, or stone.  Left kidney = 12.2 f cm.  No evidence of hydronephrosis, cyst, mass, or stone.  Bladder:  A Foley catheter in the bladder.  IMPRESSION:  No hydronephrosis.  No explanation for renal failure.   Original Report Authenticated By: Genevive Bi, M.D.    Dg Chest Port 1 View  02/26/2013  *RADIOLOGY REPORT*  Clinical Data: Postop.  Evaluate for pneumonia  PORTABLE CHEST - 1 VIEW  Comparison: Chest radiograph 02/25/2013  Findings: The stable mildly enlarged heart silhouette.  Sternotomy wires and a valve replacement noted.  There is increased air space density in the right upper lobe and right lower lobe.  No pneumothorax.  IMPRESSION: Increased air space densities in the right upper lobe and right lower lobe suggesting edema or infection.   Original Report Authenticated By: Genevive Bi, M.D.    Dg Swallowing Func-speech Pathology  02/27/2013  Berdine Dance, Student-SLP     02/27/2013  4:26 PM Objective Swallowing Evaluation: Modified Barium Swallowing Study   Patient Details  Name: Tilman Mcclaren MRN: [id number] Date of Birth: 1924/06/02  Today's Date: 02/27/2013 Time: 1410-1430 SLP Time Calculation (min): 20 min  Past Medical History:  Past Medical History  Diagnosis Date  . Hypertension   . Diabetes mellitus   . CHF (congestive heart failure)   . Coronary artery disease   . Atrial fibrillation   . PVD (peripheral vascular disease)   . S/P CABG x 1 2008  . S/P aortic valve replacement with porcine valve 2008   Past Surgical History:   Past Surgical History  Procedure Laterality Date  . Below knee leg amputation  07/2012  . Cataract extraction w/ intraocular lens implant    . Abdominal surgery    . Hernia repair      x5  . Back surgery      vertebroplasty  . Coronary artery bypass graft  2008  . Hip arthroplasty Left 02/25/2013    Procedure:  ARTHROPLASTY BIPOLAR HIP;  Surgeon: Mable Paris, MD;  Location: [facility] OR;  Service: Orthopedics;   Laterality: Left;   HPI:  Lucius Wise is an 79 y.o. 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 His son  in law was there and witnessed this mechanical fall. Evaluation  in emergency room included a head CT which showed no acute  process. CT of hip showed left femoral neck fracture with mild  displacement. S/p repair 3/10. Possible RUL PNA noted as well.  CXR : Increased air space densities in the right upper lobe and  right ower lobe suggesting edema or infection.     Assessment / Plan / Recommendation Clinical Impression  Dysphagia Diagnosis: Mild oral phase dysphagia;Moderate  pharyngeal phase dysphagia Clinical impression:  Patient presents with a moderate  oropharyngeal dysphagia characterized by a delay in swallow  initiation due to reduced base of tongue retraction and reduced  a/p propulsion in concurrence with recent CVA in 10/13 (resulting  in dysphagia), decondtioning from recent hospital course, and  patients advanced age. This resulted in penetration/aspiration of  thin liquids not ejected by spontaneous throat clear and/or  multiple swallows, however use of chin tuck strategy with nectar  thick liquids proved effective in  adequate airway protection and  reducing occurence of penetration/aspiration. SLP provided max  cues for use of chin tuck (patient HOH), and recommends full  supervision for use of this compensatory strategy. SLP provided  patient and daughter with education and rationale for current  diet and encouraged her to contact SLP with any further  questions. SLP will f/u for diet tolerance.    Treatment Recommendation  Therapy as outlined in treatment plan below    Diet Recommendation Nectar-thick liquid;Dysphagia 3 (Mechanical  Soft)   Liquid Administration via: Cup;No straw Medication Administration: Whole meds with puree Supervision: Patient able to self feed;Full supervision/cueing  for compensatory strategies Compensations: Slow rate;Small sips/bites;Clear throat  intermittently Postural Changes and/or Swallow Maneuvers: Seated upright 90  degrees;Upright 30-60 min after meal;Chin tuck    Other  Recommendations Recommended Consults: MBS Oral Care Recommendations: Oral care BID Other Recommendations: Order thickener from pharmacy;Remove water  pitcher;Prohibited food (jello, ice cream, thin soups)   Follow Up Recommendations  Other (comment) (SLP to f/u at bedside)    Frequency and Duration min 2x/week  2 weeks   Pertinent Vitals/Pain None reported    SLP Swallow Goals Patient will utilize recommended strategies during swallow to  increase swallowing safety with: Supervision/safety Swallow Study Goal #2 - Progress:  (new goal)   General HPI: Kenwood Rosiak is an 79 y.o. male, he retired  Public affairs consultant, lives at home with his daughter and  son-in-law, history of atrial fibrillation on chronic  anticoagulation, status post porcine aortic valve replacement,  coronary bypass graft time 1, diabetes, hypertension, history of  congestive heart failure, peripheral vascular disease, status  post left BKA, walking with his leg prosthesis, fell today  without any loss of consciousness. His son in law was there and  witnessed  this mechanical fall. Evaluation in emergency room  included a head CT which showed no acute process. CT of hip  showed left femoral  neck fracture with mild displacement. S/p  repair 3/10. Possible RUL PNA noted as well. CXR : Increased air  space densities in the right upper lobe and right ower lobe  suggesting edema or infection. Type of Study: Modified Barium Swallowing Study Reason for Referral: Objectively evaluate swallowing function Previous Swallow Assessment: MBS 09/2012, silent aspiration,  recommended dys 2, thin with chin tuck and multiple compensatory  strategies Diet Prior to this Study: Thin liquids (clear liquids) Temperature Spikes Noted: No Respiratory Status: Supplemental O2 delivered via (comment) History of Recent Intubation: Yes Length of Intubations (days):  (for recent surgery) Behavior/Cognition: Alert;Cooperative;Pleasant mood;Hard of  hearing;Requires cueing Oral Cavity - Dentition: Adequate natural dentition Oral Motor / Sensory Function: Within functional limits Self-Feeding Abilities: Able to feed self Patient Positioning: Upright in chair Baseline Vocal Quality: Clear Volitional Cough: Strong Volitional Swallow: Able to elicit Anatomy: Within functional limits Pharyngeal Secretions: Not observed secondary MBS    Reason for Referral Objectively evaluate swallowing function   Oral Phase Oral Preparation/Oral Phase Oral Phase: Impaired Oral - Nectar Oral - Nectar Teaspoon: Not tested (trace residue) Oral - Nectar Cup: Lingual/palatal residue Oral - Thin Oral - Thin Teaspoon: Lingual/palatal residue;Reduced posterior  propulsion Oral - Thin Cup: Lingual/palatal residue;Reduced posterior  propulsion Oral - Thin Straw: Reduced posterior propulsion Oral - Solids Oral - Puree: Lingual/palatal residue;Reduced posterior  propulsion Oral - Mechanical Soft: Lingual/palatal residue;Reduced posterior  propulsion (prolonged mastication, not uncommon given advanced  age)   Pharyngeal Phase Pharyngeal  Phase Pharyngeal Phase: Impaired Pharyngeal - Nectar Pharyngeal - Nectar Teaspoon: Not tested Pharyngeal - Nectar Cup: Delayed swallow initiation;Premature  spillage to valleculae;Reduced tongue base  retraction;Penetration/Aspiration before swallow;Pharyngeal  residue - valleculae (trace residue) Penetration/Aspiration details (nectar cup): Material enters  airway, remains ABOVE vocal cords and not ejected out Pharyngeal - Thin Pharyngeal - Thin Teaspoon: Delayed swallow initiation;Premature  spillage to valleculae;Reduced tongue base  retraction;Penetration/Aspiration before swallow Penetration/Aspiration details (thin teaspoon): Material enters  airway, remains ABOVE vocal cords and not ejected out Pharyngeal - Thin Cup: Delayed swallow initiation;Premature  spillage to valleculae;Reduced tongue base  retraction;Penetration/Aspiration before swallow Penetration/Aspiration details (thin cup): Material enters  airway, CONTACTS cords and not ejected out Pharyngeal - Thin Straw: Delayed swallow initiation;Premature  spillage to valleculae;Premature spillage to pyriform  sinuses;Reduced tongue base retraction;Penetration/Aspiration  before swallow;Trace aspiration Penetration/Aspiration details (thin straw): Material enters  airway, passes BELOW cords and not ejected out despite cough  attempt by patient Pharyngeal - Solids Pharyngeal - Puree: Delayed swallow initiation;Premature spillage  to valleculae;Premature spillage to pyriform sinuses;Reduced  tongue base retraction;Pharyngeal residue - valleculae (trace  residue) Pharyngeal - Mechanical Soft: Delayed swallow  initiation;Premature spillage to valleculae;Premature spillage to  pyriform sinuses;Reduced tongue base retraction;Pharyngeal  residue - valleculae (trace residue)  Cervical Esophageal Phase    GO   Berdine Dance SLP student            Berdine Dance 02/27/2013, 4:26 PM        Medications:    Infusions: . sodium chloride 125 mL/hr at 02/27/13 2327  .  lactated ringers      Scheduled Medications: . aspirin EC  81 mg Oral Daily  . atorvastatin  20 mg Oral q1800  . chlorhexidine  60 mL Topical Once  . docusate sodium  100 mg Oral BID  . DULoxetine  30 mg Oral QHS  . feeding supplement  237 mL Oral BID BM  . imipenem-cilastatin  250 mg Intravenous Q12H  . insulin aspart  0-15 Units  Subcutaneous Q4H  . insulin glargine  30 Units Subcutaneous QAC breakfast  . metoprolol succinate  12.5 mg Oral BID  . nystatin cream  1 application Topical QPM  . sodium chloride  3 mL Intravenous Q12H  . sodium chloride  3 mL Intravenous Q12H  . traZODone  25 mg Oral QHS  . vancomycin  750 mg Intravenous Q48H  . vancomycin  1,000 mg Intravenous Once  . Warfarin - Pharmacist Dosing Inpatient   Does not apply q1800    PRN Medications: acetaminophen, acetaminophen, alum & mag hydroxide-simeth, barrier cream, bisacodyl, diphenhydrAMINE, HYDROcodone-acetaminophen, HYDROmorphone (DILAUDID) injection, menthol-cetylpyridinium, metoCLOPramide (REGLAN) injection, metoCLOPramide, ondansetron (ZOFRAN) IV, ondansetron, oxyCODONE, oxyCODONE-acetaminophen, phenol, polyethylene glycol, sodium chloride   Assessment/ Plan:    1. Acute Kidney Injury on Chronic Kidney Disease Stage 3: likely secondary to hypotension with resulting ischemic ATN in setting of diabetes, peripheral vascular disease, and longstanding hypertension.  Pt was oliguric now responsive to lasix challenge (20 mg IV) and blood transfusion  with good UOP ~2.2L over past day. Creatinine level trending back down towards baseline at 2.07 today. -no acute need for HD at this time -cont IV hydration as tolerated -cont strict  I/Os -no NSAIDS or contrast   Recent Labs Lab 02/24/13 0625 02/25/13 1203 02/26/13 0500 02/27/13 0530 02/28/13 0530  CREATININE 2.02* 1.65* 2.28* 2.76* 2.07*    2. Anemia: Hgb 10.5 after transfusion 2 units PRBCs, no blood in stools, no hematoma evident, on Warfarin therapy   -cont to monitor for source of bleeding and transfuse as appropriate per primary team   Recent Labs Lab 02/24/13 0625 02/25/13 0900 02/26/13 0500 02/27/13 0530 02/28/13 0530  HGB 10.0* 9.9* 8.1* 7.7* 10.5*     3. Diabetes Mellitus: on insulin, management per primary team   CBG (last 3)   Recent Labs  02/28/13 0004 02/28/13 0405 02/28/13 0800  GLUCAP 197* 187* 246*    4. Atrial Fibrillation: stable on metoprolol and Warfarin, INR pending today but was therapeutic yesterday -cont telemetry   Recent Labs Lab 02/24/13 1611 02/25/13 0900 02/26/13 0500 02/27/13 0530 02/28/13 0530  INR 2.21* 1.41 1.78* 2.82* 5.10*    5. Left hip greater trochanter fracture s/p ORIF: ~200 cc blood loss 02/22/2013; managed per Orthopedics  6. Code Status: DNR  7. DVT ppx: on Warfarin    Length of Stay: 6 days  Patient history and plan of care reviewed with attending, Dr. Allena Katz.   I have personally seen and examined this patient and agree with the assessment/plan as outlined above by Bosie Clos MD (PGY2). Appears to have had post-fracture pre-renal ARF--- improving UOP and renal function s/p PRBCs and saline. Will give lasix and DC IVFs for now. Inocencia Murtaugh K.,MD 02/28/2013 9:38 AM  Hartley Barefoot. Allena Katz, MD  PGYII, Internal Medicine Resident 02/28/2013, 9:37 AM

## 2013-02-28 NOTE — Progress Notes (Signed)
PATIENT ID: Aaron [name]   3 Days Post-Op Procedure(s) (LRB): ARTHROPLASTY BIPOLAR HIP (Left)  Subjective: Patient is feeling well today. He is excited it [birth year] his [birth year]. His daughters report that he says pain level is improving and he seems more alert and orientated. Mild-mod left hip pain.   Objective:  Filed Vitals:   02/28/13 1025  BP: 133/50  Pulse: 108  Temp:   Resp:      Awake, alert, no acute distress Pain with movement of L hip Dressing changed today, no impressive bleeding from wound Incision benign, no swelling, erythema, warmth. Staples intact    Labs:   Recent Labs  02/26/13 0500 02/27/13 0530 02/28/13 0530  HGB 8.1* 7.7* 10.5*   Recent Labs  02/27/13 0530 02/28/13 0530  WBC 13.0* 10.7*  RBC 2.93* 3.84*  HCT 22.8* 30.7*  PLT 169 148*   Recent Labs  02/27/13 0530 02/28/13 0530  NA 134* 137  K 4.6 4.4  CL 102 106  CO2 20 23  BUN 83* 66*  CREATININE 2.76* 2.07*  GLUCOSE 309* 205*  CALCIUM 7.7* 8.0*    Assessment and Plan: 3 days s/p left hip hemiarthroplasty, ORIF GT fracture NWB OOB to chair when able ABLA: s/p tranfusion, hgb now 10.5 INR today 5.1, pharmacy noted they will continue to hold coumadin and recommended Vit K. Due to complex nature of anticoagulation and patient's multiple comorbidites, will defer anticoagulation mgmt to primary team Pain is well controlled, continue current pain mgmt  VTE proph: per primary team

## 2013-02-28 NOTE — Evaluation (Signed)
Occupational Therapy Evaluation Patient Details Name: Karina Lenderman MRN: [id number] DOB: March 09, 1924 Today's Date: 02/28/2013 Time: 4098-1191 OT Time Calculation (min): 33 min  OT Assessment / Plan / Recommendation Clinical Impression  This 79 y.o. male with h/o left below-knee amputation with prosthesis August of 2013 and CVA October 2013 sustained a fall at home suffering Lt. femur fx.  Pt underwent hemiarthroplasy and now is NWB on Lt. with posterior THA.  Pt. demonstrates the below listed deficits and will benefit from OT to maximize safety and independence with BADLs to allow pt.to return home with 24 hour caregivers and mod A after post acute rehab SNF vs. CIR depending on pt. progress    OT Assessment  Patient needs continued OT Services    Follow Up Recommendations  CIR;Supervision/Assistance - 24 hour    Barriers to Discharge None    Equipment Recommendations  None recommended by OT    Recommendations for Other Services    Frequency  Min 2X/week    Precautions / Restrictions Precautions Precautions: Fall;Posterior Hip Precaution Comments: Pt unable to recall THA precautions Restrictions Weight Bearing Restrictions: Yes LLE Weight Bearing: Non weight bearing       ADL  Eating/Feeding: Supervision/safety Where Assessed - Eating/Feeding: Bed level Grooming: Wash/dry hands;Wash/dry face;Teeth care;Shaving;Brushing hair;Moderate assistance Where Assessed - Grooming: Supine, head of bed up Upper Body Bathing: Maximal assistance Where Assessed - Upper Body Bathing: Supine, head of bed up Lower Body Bathing: +1 Total assistance Where Assessed - Lower Body Bathing: Supine, head of bed up Upper Body Dressing: Simulated;Maximal assistance Where Assessed - Upper Body Dressing: Supine, head of bed up Lower Body Dressing: +1 Total assistance Where Assessed - Lower Body Dressing: Supine, head of bed up Toilet Transfer: +1 Total assistance (Pt unable) Toileting - Clothing  Manipulation and Hygiene: +1 Total assistance Where Assessed - Toileting Clothing Manipulation and Hygiene: Supine, head of bed flat Tub/Shower Transfer: +1 Total assistance (unable) Equipment Used: Knee Immobilizer Transfers/Ambulation Related to ADLs: Pt unable ADL Comments: Pt. attempted to move to EOB with max A.  shifted hips toward Edge, then moved Rt. LE off be, but pain too severe to continue.   Shifted hips back to center of bed with min A    OT Diagnosis: Generalized weakness;Cognitive deficits;Acute pain  OT Problem List: Decreased strength;Decreased activity tolerance;Decreased safety awareness;Decreased knowledge of use of DME or AE;Decreased knowledge of precautions;Pain;Decreased cognition;Decreased coordination OT Treatment Interventions: Self-care/ADL training;DME and/or AE instruction;Therapeutic activities;Cognitive remediation/compensation;Patient/family education;Balance training   OT Goals Acute Rehab OT Goals OT Goal Formulation: With patient/family Time For Goal Achievement: 03/14/13 Potential to Achieve Goals: Good ADL Goals Pt Will Perform Grooming: with min assist;Sitting, edge of bed ADL Goal: Grooming - Progress: Goal set today Pt Will Perform Upper Body Bathing: with min assist;Sitting, chair;Sitting, edge of bed ADL Goal: Upper Body Bathing - Progress: Goal set today Pt Will Perform Lower Body Bathing: with mod assist;Sit to stand from bed;Sit to stand from chair ADL Goal: Lower Body Bathing - Progress: Goal set today Pt Will Transfer to Toilet: with mod assist;3-in-1;Stand pivot transfer;Maintaining hip precautions ADL Goal: Toilet Transfer - Progress: Goal set today Additional ADL Goal #1: Pt will be min A with bed mobility in prep for ADLs ADL Goal: Additional Goal #1 - Progress: Goal set today  Visit Information  Last OT Received On: 02/28/13 Assistance Needed: +2    Subjective Data  Subjective: "I will try to do what you tell me" Patient Stated  Goal: To get better  Prior Functioning     Home Living Lives With: Other (Comment) (has 24 hour hired caregiver) Available Help at Discharge: Personal care attendant;Available 24 hours/day Type of Home: House Home Access: Ramped entrance Home Layout: Two level;Able to live on main level with bedroom/bathroom Bathroom Shower/Tub: Tub/shower unit;Curtain Bathroom Toilet: Handicapped height Home Adaptive Equipment: Grab bars in shower;Walker - rolling;Tub transfer bench;Grab bars around toilet;Wheelchair - manual;Bedside commode/3-in-1 Additional Comments: couch and bed are on risers Prior Function Level of Independence: Needs assistance Needs Assistance: Bathing;Dressing;Light Housekeeping;Meal Prep;Transfers;Gait Bath: Minimal Dressing: Supervision/set-up Meal Prep: Total Light Housekeeping: Total Gait Assistance: able to walk with prosthesis and RW grossly 100' Transfer Assistance: supervision for transfers Able to Take Stairs?: No Driving: No Vocation: Retired Musician: No difficulties Dominant Hand: Right         Vision/Perception Vision - Assessment Vision Assessment: Vision not tested Praxis Praxis: Intact   Cognition  Cognition Overall Cognitive Status: Impaired Area of Impairment: Attention;Memory;Following commands;Safety/judgement;Problem solving Arousal/Alertness: Awake/alert Orientation Level: Disoriented to;Time Behavior During Session: Anxious Current Attention Level: Sustained Memory: Decreased recall of precautions Following Commands: Follows one step commands consistently Safety/Judgement: Decreased awareness of safety precautions Problem Solving: Pt requires mod cues for problem solving Cognition - Other Comments: Confusion noted, and needs to be re-directed    Extremity/Trunk Assessment Right Upper Extremity Assessment RUE ROM/Strength/Tone: Deficits RUE ROM/Strength/Tone Deficits: Assesed through function.  Pt. able to  reach UE overhead to reach for bedrails.  Pt. with limited index finger flexion RUE Sensation: WFL - Light Touch RUE Coordination: Deficits RUE Coordination Deficits: impaired FMC due to stroke Left Upper Extremity Assessment LUE ROM/Strength/Tone: Within functional levels LUE Sensation: WFL - Light Touch LUE Coordination: WFL - gross/fine motor     Mobility Bed Mobility Details for Bed Mobility Assistance: See ADL comments - attempte to move to EOB Transfers Transfers: Not assessed Details for Transfer Assistance: Pt unable to tolerate movement to EOB     Exercise     Balance     End of Session OT - End of Session Equipment Utilized During Treatment: Left knee immobilizer Activity Tolerance: Patient limited by pain Patient left: in bed;with call bell/phone within reach;with family/visitor present;with nursing in room Nurse Communication: Patient requests pain meds  GO     Conarpe, Wendi M 02/28/2013, 11:00 AM

## 2013-02-28 NOTE — Progress Notes (Signed)
Rehab MD Clementeen Hoof consult note 02/27/13] recommends home w/ 24/7 assist & HH f/up [or, if family prefers, SNF].  Spoke w/ pt's SW, Delice Bison, about this info.  CIR will sign off.  [phone]

## 2013-02-28 NOTE — Progress Notes (Signed)
Physical Therapy Treatment Patient Details Name: Aaron [name] MRN: [id number] DOB: [birth year] Today's Date: 02/28/2013 Time: 4098-1191 PT Time Calculation (min): 18 min  PT Assessment / Plan / Recommendation Comments on Treatment Session  Pt able to roll side to side today and was assisting with use of rail and with MOD A.  Pt's pain limiting at times despite having pain meds in system.  Pt able to perform glut sets but not passive hip abd.     Follow Up Recommendations  SNF     Does the patient have the potential to tolerate intense rehabilitation     Barriers to Discharge        Equipment Recommendations  None recommended by PT    Recommendations for Other Services    Frequency Min 3X/week   Plan Discharge plan remains appropriate    Precautions / Restrictions Precautions Precautions: Fall;Posterior Hip Precaution Comments: Pt unable to recall THA precautions Restrictions Weight Bearing Restrictions: Yes LLE Weight Bearing: Non weight bearing Other Position/Activity Restrictions: L BKA   Pertinent Vitals/Pain Unable to rate, but appears 6-8 on FACES scale with activity    Mobility  Bed Mobility Bed Mobility: Rolling Right;Rolling Left Rolling Right: 3: Mod assist;With rail Rolling Left: 3: Mod assist;With rail Details for Bed Mobility Assistance: verbal and tactile cues for hand placement and to follow hip precautions with rolling.  Cues for breathing as well.  Unable to get to EOB due to weakness and discomfort. Transfers Transfers: Not assessed Details for Transfer Assistance: Pt unable to tolerate movement to EOB    Exercises Total Joint Exercises Ankle Circles/Pumps: Right;10 reps;Supine Gluteal Sets: Strengthening;Supine;5 reps Heel Slides: Right;10 reps;Supine Hip ABduction/ADduction: Right;10 reps;Supine (attempted L hip abd, but pt unable to tolerate)   PT Diagnosis:    PT Problem List:   PT Treatment Interventions:     PT Goals Acute Rehab PT  Goals Pt will Roll Supine to Right Side: with min assist PT Goal: Rolling Supine to Right Side - Progress: Updated due to goal met Pt will Roll Supine to Left Side: with min assist PT Goal: Rolling Supine to Left Side - Progress: Updated due to goal met  Visit Information  Last PT Received On: 02/28/13 Assistance Needed: +2 Reason Eval/Treat Not Completed: Pain limiting ability to participate    Subjective Data  Subjective: Daughters present decorating room for patient's b-day.   Cognition  Cognition Overall Cognitive Status: Impaired Area of Impairment: Problem solving Arousal/Alertness: Awake/alert Orientation Level: Disoriented to;Time Behavior During Session: Anxious Current Attention Level: Sustained Memory: Decreased recall of precautions Following Commands: Follows one step commands consistently Safety/Judgement: Decreased awareness of safety precautions Problem Solving: Pt requires mod cues for problem solving Cognition - Other Comments: Confusion noted, and needs to be re-directed    Balance     End of Session PT - End of Session Activity Tolerance: Patient limited by pain Patient left: in bed;with nursing in room;with family/visitor present;with call bell/phone within reach   GP     [facility] LUBECK 02/28/2013, 12:30 PM

## 2013-02-28 NOTE — Progress Notes (Signed)
[facility] PROGRESS NOTE  Stephanos Fan ZOX:[id number] DOB: 04-01-1924 DOA: 02/22/2013 PCP: Danella Penton., MD  Assessment/Plan: Principal Problem:   Hip fracture, left Active Problems:   Diabetes mellitus   A-fib   S/P CABG x 1   Aortic valve replaced   DNR (do not resuscitate)   1. Hip fracture-on anticoagulation, discontinue HEPARIN AS INR IS THERAPEUTIC, INR IS SUPRATHERAPEUTIC THEREFORE COUMADIN PLACED ON HOLD 2. Atrial fibrillation /aortic valve replacement with a porcine valveon Coumadin, start heparin drip when INR less than 2 without a bolus, decreased metoprolol because of low blood pressure 3. Prior history of CVA in the setting of subtherapeutic INR 4. Diabetes mellitus continue sliding scale insulin, CBG elevated, increase Lantus to 40 5. Acute on chronic kidney injury creatinine is gone up from 1.69-2.7, now improving, to baseline of about 1.05 we'll start the patient on IV hydration, discontinue Toradol, renal USG negative, UA requested multiple times but not collected . Request a nephrology consult, renal function improving today, nephrology service has given him Lasix and discontinued IV fluids 6. Hyperkalemia, dehydration recheck labs today 7. Fever , possible PNA RUL , WILL REPEAT CXR , started vancomycin anemia  8. Anemia hemoglobin trending down, in the setting of renal insufficiency and low blood pressure, status post transfusion of 2 packed red blood cells 9. Transaminitis unclear etiology will order a right upper quadrant ultrasound, likely in the setting of hypotension and shock liver, the need a GI consultation if LFTs are trending up, will discontinue statin 10.  Code Status: DO NOT RESUSCITATE  Family Communication: family updated about patient's clinical progress  Disposition Plan: SNF    Brief narrative:  Aaron [name] is an 79 y.o. male, he retired Public affairs consultant, lives at home with his daughter and son-in-law, history of atrial fibrillation on  chronic anticoagulation, status post porcine aortic valve replacement, coronary bypass graft time 1, diabetes, hypertension, history of congestive heart failure, peripheral vascular disease, status post left BKA, walking with his leg prosthesis, fell today without any loss of consciousness. His son in law was there and witnessed this mechanical fall. Evaluation in emergency room included a head CT which showed no acute process. At the time of his hip showed left femoral neck fracture with mild displacement. His INR is 2.23, white count 19.7 thousand, hemoglobin of 11.7. His creatinine is 1.7 with BUN of 61. Orthopedics was consulted by DEP and has seen the patient. Hospitalist was asked to admit him for hip fracture  Consultants:  Mable Paris, MD  Procedures:  none Antibiotics:  none   Reports lying comfortably. No events overnight. Eating this am without problems. No complaints or concerns    Patient is feeling well today. He is excited it is his birthday. His daughters report that he says pain level is improving and he seems more alert and orientated. Mild-mod left hip pain.     Objective: Filed Vitals:   02/27/13 2026 02/28/13 0000 02/28/13 0403 02/28/13 1025  BP: 131/66  133/76 133/50  Pulse: 81  95 108  Temp: 97.8 F (36.6 C)  98.3 F (36.8 C)   TempSrc: Axillary  Oral   Resp: 16 18 17    Height:      Weight:      SpO2: 94% 95% 97%     Intake/Output Summary (Last 24 hours) at 02/28/13 1402 Last data filed at 02/28/13 1319  Gross per 24 hour  Intake 2832.91 ml  Output   2725 ml  Net 107.91 ml  Exam:  HENT:  Head: Atraumatic.  Nose: Nose normal.  Mouth/Throat: Oropharynx is clear and moist.  Eyes: Conjunctivae are normal. Pupils are equal, round, and reactive to light. No scleral icterus.  Neck: Neck supple. No tracheal deviation present.  Cardiovascular: Normal rate, regular rhythm, normal heart sounds and intact distal pulses.  Pulmonary/Chest:  Effort normal and breath sounds normal. No respiratory distress.  Abdominal: Soft. Normal appearance and bowel sounds are normal. She exhibits no distension. There is no tenderness.  Musculoskeletal: She exhibits no edema and no tenderness.  Neurological: She is alert. No cranial nerve deficit.    Data Reviewed: Basic Metabolic Panel:  Recent Labs Lab 02/24/13 0625 02/25/13 1203 02/26/13 0500 02/27/13 0530 02/28/13 0530  NA 135 133* 137 134* 137  K 5.2* 5.1 5.1 4.6 4.4  CL 98 98 102 102 106  CO2 23 18* 24 20 23   GLUCOSE 268* 347* 330* 309* 205*  BUN 65* 64* 77* 83* 66*  CREATININE 2.02* 1.65* 2.28* 2.76* 2.07*  CALCIUM 8.7 8.6 8.2* 7.7* 8.0*    Liver Function Tests:  Recent Labs Lab 02/22/13 2203 02/25/13 1203 02/27/13 0530 02/28/13 0530  AST 24 17 1051* 568*  ALT 20 13 944* 950*  ALKPHOS 66 66 68 79  BILITOT 0.4 0.9 0.8 1.0  PROT 7.4 6.5 5.6* 5.8*  ALBUMIN 3.6 2.9* 2.7* 2.7*   No results found for this basename: LIPASE, AMYLASE,  in the last 168 hours No results found for this basename: AMMONIA,  in the last 168 hours  CBC:  Recent Labs Lab 02/22/13 2203 02/24/13 0625 02/25/13 0900 02/26/13 0500 02/27/13 0530 02/28/13 0530  WBC 19.7* 17.4* 16.3* 13.6* 13.0* 10.7*  NEUTROABS 17.8*  --   --   --  10.9*  --   HGB 11.7* 10.0* 9.9* 8.1* 7.7* 10.5*  HCT 36.0* 30.5* 30.3* 23.8* 22.8* 30.7*  MCV 79.8 79.0 79.3 77.5* 77.8* 79.9  PLT 260 210 205 175 169 148*    Cardiac Enzymes:  Recent Labs Lab 02/22/13 2203  TROPONINI <0.30   BNP (last 3 results)  Recent Labs  09/30/12 1233  PROBNP 29199.0*     CBG:  Recent Labs Lab 02/27/13 2025 02/28/13 0004 02/28/13 0405 02/28/13 0800 02/28/13 1138  GLUCAP 242* 197* 187* 246* 187*    Recent Results (from the past 240 hour(s))  SURGICAL PCR SCREEN     Status: None   Collection Time    02/23/13  5:15 AM      Result Value Range Status   MRSA, PCR NEGATIVE  NEGATIVE Final   Staphylococcus aureus  NEGATIVE  NEGATIVE Final   Comment:            The Xpert SA Assay (FDA     approved for NASAL specimens     in patients over 64 years of age),     is one component of     a comprehensive surveillance     program.  Test performance has     been validated by [employer] for patients greater     than or equal to 30 year old.     It is not intended     to diagnose infection nor to     guide or monitor treatment.  URINE CULTURE     Status: None   Collection Time    02/27/13  3:50 AM      Result Value Range Status   Specimen Description URINE, CATHETERIZED  Final   Special Requests NONE   Final   Culture  Setup Time 02/27/2013 03:50   Final   Colony Count NO GROWTH   Final   Culture NO GROWTH   Final   Report Status 02/28/2013 FINAL   Final     Studies: Dg Chest 1 View  02/22/2013  *RADIOLOGY REPORT*  Clinical Data: Status post fall; concern for chest injury.  CHEST - 1 VIEW  Comparison: Chest radiograph performed 10/02/2012, and CT of the thoracic spine performed 10/02/2012  Findings: The lungs are well-aerated.  Mild vascular congestion is noted, without significant pulmonary edema.  There is no evidence of focal opacification, pleural effusion or pneumothorax.  The cardiomediastinal silhouette is borderline normal in size.  An aortic valve replacement is noted.  The patient is status post median sternotomy, with evidence of prior CABG.  No acute osseous abnormalities are seen.  IMPRESSION: Mild vascular congestion noted; lungs remain grossly clear.  No displaced rib fractures seen.   Original Report Authenticated By: Tonia Ghent, M.D.    Dg Hip Complete Left  02/22/2013  *RADIOLOGY REPORT*  Clinical Data: Status post fall; left hip pain.  LEFT HIP - COMPLETE 2+ VIEW  Comparison: None.  Findings: There appears to be a mildly displaced basicervical fracture through the left femoral neck; the fracture line is difficult to fully characterize, but there is abnormal alignment of the  proximal left femur.  The right hip joint is grossly unremarkable in appearance.  Mild degenerative change is noted at the lower lumbar spine.  The sacroiliac joints are grossly unremarkable in appearance.  Diffuse vascular calcifications are seen.  The visualized bowel gas pattern is grossly unremarkable.  IMPRESSION:  1.  Mildly displaced basicervical fracture through the left femoral neck. 2.  Diffuse vascular calcifications seen.   Original Report Authenticated By: Tonia Ghent, M.D.    Ct Head Wo Contrast  02/22/2013  *RADIOLOGY REPORT*  Clinical Data: Fall with hip pain.  CT HEAD WITHOUT CONTRAST  Technique:  Contiguous axial images were obtained from the base of the skull through the vertex without contrast.  Comparison: 09/27/2012  Findings: Bone windows demonstrate mucosal thickening of the left maxillary sinus. Clear mastoid air cells.  Soft tissue windows demonstrate expected cerebral atrophy.  Mild low density in the periventricular white matter likely related to small vessel disease.Posterior left frontal lobe nonacute infarct, including on image 22/series 2. No  mass lesion, hemorrhage, hydrocephalus, acute infarct, intra-axial, or extra-axial fluid collection.  IMPRESSION:  1. No acute intracranial abnormality. 2.  Cerebral atrophy with remote left posterior frontal lobe infarct.   Original Report Authenticated By: Jeronimo Greaves, M.D.    US Renal  02/26/2013  *RADIOLOGY REPORT*  Clinical Data: Acute renal failure  RENAL/URINARY TRACT ULTRASOUND COMPLETE  Comparison: Renal ultrasound 09/27/2012  Findings:  Right Kidney = 10.4 cm.  No evidence of hydronephrosis, cyst, mass, or stone.  Left kidney = 12.2 f cm.  No evidence of hydronephrosis, cyst, mass, or stone.  Bladder:  A Foley catheter in the bladder.  IMPRESSION:  No hydronephrosis.  No explanation for renal failure.   Original Report Authenticated By: Genevive Bi, M.D.    Dg Pelvis Portable  02/25/2013  *RADIOLOGY REPORT*  Clinical  Data: Left hip prosthesis placement following a left femoral neck fracture.  PORTABLE PELVIS  Comparison: 02/22/2013.  Findings: Interval left hip prosthesis in satisfactory position and alignment.  No acute fracture or dislocation.  IMPRESSION: Satisfactory postoperative appearance of the left hip prosthesis.  Original Report Authenticated By: Beckie Salts, M.D.    Dg Chest Port 1 View  02/26/2013  *RADIOLOGY REPORT*  Clinical Data: Postop.  Evaluate for pneumonia  PORTABLE CHEST - 1 VIEW  Comparison: Chest radiograph 02/25/2013  Findings: The stable mildly enlarged heart silhouette.  Sternotomy wires and a valve replacement noted.  There is increased air space density in the right upper lobe and right lower lobe.  No pneumothorax.  IMPRESSION: Increased air space densities in the right upper lobe and right lower lobe suggesting edema or infection.   Original Report Authenticated By: Genevive Bi, M.D.    Dg Chest Port 1 View  02/25/2013  *RADIOLOGY REPORT*  Clinical Data: Clinical concern for pneumonia.  No reason given.  PORTABLE CHEST - 1 VIEW  Comparison: 02/22/2013.  Findings: Interval small amount of ill-defined patchy opacity in the right upper lobe.  Interval mild increase in prominence of the interstitial markings, with Kerley lines on the right.  Mildly progressive enlargement of the cardiac silhouette.  Stable post CABG changes and prosthetic heart valve.  Stable thoracolumbar vertebroplasty material and diffuse osteopenia.  IMPRESSION:  1.  Interval mild changes of congestive heart failure with a small amount of alveolar edema or pneumonia in the right upper lobe. 2.  Mildly progressive cardiomegaly.   Original Report Authenticated By: Beckie Salts, M.D.    Dg Swallowing Func-speech Pathology  02/27/2013  Berdine Dance, Student-SLP     02/27/2013  4:26 PM Objective Swallowing Evaluation: Modified Barium Swallowing Study   Patient Details  Name: Sajid Ruppert MRN: [id number] Date of Birth:  1924-10-02  Today's Date: 02/27/2013 Time: 1410-1430 SLP Time Calculation (min): 20 min  Past Medical History:  Past Medical History  Diagnosis Date  . Hypertension   . Diabetes mellitus   . CHF (congestive heart failure)   . Coronary artery disease   . Atrial fibrillation   . PVD (peripheral vascular disease)   . S/P CABG x 1 2008  . S/P aortic valve replacement with porcine valve 2008   Past Surgical History:  Past Surgical History  Procedure Laterality Date  . Below knee leg amputation  07/2012  . Cataract extraction w/ intraocular lens implant    . Abdominal surgery    . Hernia repair      x5  . Back surgery      vertebroplasty  . Coronary artery bypass graft  2008  . Hip arthroplasty Left 02/25/2013    Procedure: ARTHROPLASTY BIPOLAR HIP;  Surgeon: Mable Paris, MD;  Location: [facility];  Service: Orthopedics;   Laterality: Left;   HPI:  Jaramie Bastos is an 79 y.o. 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 His son  in law was there and witnessed this mechanical fall. Evaluation  in emergency room included a head CT which showed no acute  process. CT of hip showed left femoral neck fracture with mild  displacement. S/p repair 3/10. Possible RUL PNA noted as well.  CXR : Increased air space densities in the right upper lobe and  right ower lobe suggesting edema or infection.     Assessment / Plan / Recommendation Clinical Impression  Dysphagia Diagnosis: Mild  oral phase dysphagia;Moderate  pharyngeal phase dysphagia Clinical impression:  Patient presents with a moderate  oropharyngeal dysphagia characterized by a delay in swallow   initiation due to reduced base of tongue retraction and reduced  a/p propulsion in concurrence with recent CVA in 10/13 (resulting  in dysphagia), decondtioning from recent hospital course, and  patients advanced age. This resulted in penetration/aspiration of  thin liquids not ejected by spontaneous throat clear and/or  multiple swallows, however use of chin tuck strategy with nectar  thick liquids proved effective in adequate airway protection and  reducing occurence of penetration/aspiration. SLP provided max  cues for use of chin tuck (patient HOH), and recommends full  supervision for use of this compensatory strategy. SLP provided  patient and daughter with education and rationale for current  diet and encouraged her to contact SLP with any further  questions. SLP will f/u for diet tolerance.    Treatment Recommendation  Therapy as outlined in treatment plan below    Diet Recommendation Nectar-thick liquid;Dysphagia 3 (Mechanical  Soft)   Liquid Administration via: Cup;No straw Medication Administration: Whole meds with puree Supervision: Patient able to self feed;Full supervision/cueing  for compensatory strategies Compensations: Slow rate;Small sips/bites;Clear throat  intermittently Postural Changes and/or Swallow Maneuvers: Seated upright 90  degrees;Upright 30-60 min after meal;Chin tuck    Other  Recommendations Recommended Consults: MBS Oral Care Recommendations: Oral care BID Other Recommendations: Order thickener from pharmacy;Remove water  pitcher;Prohibited food (jello, ice cream, thin soups)   Follow Up Recommendations  Other (comment) (SLP to f/u at bedside)    Frequency and Duration min 2x/week  2 weeks   Pertinent Vitals/Pain None reported    SLP Swallow Goals Patient will utilize recommended strategies during swallow to  increase swallowing safety with: Supervision/safety Swallow Study Goal #2 - Progress:  (new goal)   General HPI: Taeshawn Helfman is an 79 y.o. male, he retired  Oceanographer, lives at home with his daughter and  son-in-law, history of atrial fibrillation on chronic  anticoagulation, status post porcine aortic valve replacement,  coronary bypass graft time 1, diabetes, hypertension, history of  congestive heart failure, peripheral vascular disease, status  post left BKA, walking with his leg prosthesis, fell today  without any loss of consciousness. His son in law was there and  witnessed this mechanical fall. Evaluation in emergency room  included a head CT which showed no acute process. CT of hip  showed left femoral neck fracture with mild displacement. S/p  repair 3/10. Possible RUL PNA noted as well. CXR : Increased air  space densities in the right upper lobe and right ower lobe  suggesting edema or infection. Type of Study: Modified Barium Swallowing Study Reason for Referral: Objectively evaluate swallowing function Previous Swallow Assessment: MBS 09/2012, silent aspiration,  recommended dys 2, thin with chin tuck and multiple compensatory  strategies Diet Prior to this Study: Thin liquids (clear liquids) Temperature Spikes Noted: No Respiratory Status: Supplemental O2 delivered via (comment) History of Recent Intubation: Yes Length of Intubations (days):  (for recent surgery) Behavior/Cognition: Alert;Cooperative;Pleasant mood;Hard of  hearing;Requires cueing Oral Cavity - Dentition: Adequate natural dentition Oral Motor / Sensory Function: Within functional limits Self-Feeding Abilities: Able to feed self Patient Positioning: Upright in chair Baseline Vocal Quality: Clear Volitional Cough: Strong Volitional Swallow: Able to elicit Anatomy: Within functional limits Pharyngeal Secretions: Not observed secondary MBS    Reason for Referral Objectively evaluate swallowing function   Oral Phase Oral Preparation/Oral Phase Oral Phase: Impaired  Oral - Nectar Oral - Nectar Teaspoon: Not tested (trace residue) Oral - Nectar Cup: Lingual/palatal residue Oral - Thin Oral - Thin  Teaspoon: Lingual/palatal residue;Reduced posterior  propulsion Oral - Thin Cup: Lingual/palatal residue;Reduced posterior  propulsion Oral - Thin Straw: Reduced posterior propulsion Oral - Solids Oral - Puree: Lingual/palatal residue;Reduced posterior  propulsion Oral - Mechanical Soft: Lingual/palatal residue;Reduced posterior  propulsion (prolonged mastication, not uncommon given advanced  age)   Pharyngeal Phase Pharyngeal Phase Pharyngeal Phase: Impaired Pharyngeal - Nectar Pharyngeal - Nectar Teaspoon: Not tested Pharyngeal - Nectar Cup: Delayed swallow initiation;Premature  spillage to valleculae;Reduced tongue base  retraction;Penetration/Aspiration before swallow;Pharyngeal  residue - valleculae (trace residue) Penetration/Aspiration details (nectar cup): Material enters  airway, remains ABOVE vocal cords and not ejected out Pharyngeal - Thin Pharyngeal - Thin Teaspoon: Delayed swallow initiation;Premature  spillage to valleculae;Reduced tongue base  retraction;Penetration/Aspiration before swallow Penetration/Aspiration details (thin teaspoon): Material enters  airway, remains ABOVE vocal cords and not ejected out Pharyngeal - Thin Cup: Delayed swallow initiation;Premature  spillage to valleculae;Reduced tongue base  retraction;Penetration/Aspiration before swallow Penetration/Aspiration details (thin cup): Material enters  airway, CONTACTS cords and not ejected out Pharyngeal - Thin Straw: Delayed swallow initiation;Premature  spillage to valleculae;Premature spillage to pyriform  sinuses;Reduced tongue base retraction;Penetration/Aspiration  before swallow;Trace aspiration Penetration/Aspiration details (thin straw): Material enters  airway, passes BELOW cords and not ejected out despite cough  attempt by patient Pharyngeal - Solids Pharyngeal - Puree: Delayed swallow initiation;Premature spillage  to valleculae;Premature spillage to pyriform sinuses;Reduced  tongue base retraction;Pharyngeal residue -  valleculae (trace  residue) Pharyngeal - Mechanical Soft: Delayed swallow  initiation;Premature spillage to valleculae;Premature spillage to  pyriform sinuses;Reduced tongue base retraction;Pharyngeal  residue - valleculae (trace residue)  Cervical Esophageal Phase    GO   Berdine Dance SLP student            Berdine Dance 02/27/2013, 4:26 PM      Scheduled Meds: . aspirin EC  81 mg Oral Daily  . atorvastatin  20 mg Oral q1800  . chlorhexidine  60 mL Topical Once  . docusate sodium  100 mg Oral BID  . DULoxetine  30 mg Oral QHS  . feeding supplement  237 mL Oral BID BM  . imipenem-cilastatin  250 mg Intravenous Q12H  . insulin aspart  0-15 Units Subcutaneous Q4H  . insulin glargine  30 Units Subcutaneous QAC breakfast  . metoprolol succinate  12.5 mg Oral BID  . nystatin cream  1 application Topical QPM  . sodium chloride  3 mL Intravenous Q12H  . sodium chloride  3 mL Intravenous Q12H  . traZODone  25 mg Oral QHS  . vancomycin  750 mg Intravenous Q48H  . vancomycin  1,000 mg Intravenous Once  . Warfarin - Pharmacist Dosing Inpatient   Does not apply q1800   Continuous Infusions: . lactated ringers      Principal Problem:   Hip fracture, left Active Problems:   Diabetes mellitus   A-fib   S/P CABG x 1   Aortic valve replaced   DNR (do not resuscitate)    Time spent: 40 minutes   [facility]  [facility] Pager [phone]. If 8PM-8AM, please contact night-coverage at [email or web address], password [facility] 02/28/2013, 2:02 PM  LOS: 6 days

## 2013-02-28 NOTE — Progress Notes (Signed)
Speech Language Pathology Dysphagia Treatment Patient Details Name: Aaron [name] MRN: [id number] DOB: 05-Apr-1924 Today's Date: 02/28/2013 Time: 8295-6213 SLP Time Calculation (min): 22 min  Assessment / Plan / Recommendation Clinical Impression  Pt. seen for skilled dysphagia treatment on his birthday today with daughters at bedside.  Reviewed MBS results and strategies with pt. and family.  He consumed nectar thick juice without s/s aspiration and performed chin tuck consistently without cues or reminders needed.  Pt. took large sips and clinical rationale for small sips verbalized to pt.  He politley declined cracker. SLP ordered can of thickener and explained process of thickening with daughters.  Pt. likely will be discharged with rec's to continue thickener and HH ST for further treatment, education and repeat outpt. MBS.    Diet Recommendation  Continue with Current Diet: Dysphagia 3 (mechanical soft);Nectar-thick liquid    SLP Plan Continue with current plan of care   Pertinent Vitals/Pain none   Swallowing Goals  SLP Swallowing Goals Patient will utilize recommended strategies during swallow to increase swallowing safety with: Supervision/safety Swallow Study Goal #2 - Progress: Progressing toward goal  General Temperature Spikes Noted: No Respiratory Status: Supplemental O2 delivered via (comment) Behavior/Cognition: Alert;Cooperative;Pleasant mood;Hard of hearing;Requires cueing Oral Cavity - Dentition: Adequate natural dentition Patient Positioning: Upright in bed  Oral Cavity - Oral Hygiene Does patient have any of the following "at risk" factors?: Oxygen therapy - cannula, mask, simple oxygen devices;Diet - patient on thickened liquids Brush patient's teeth BID with toothbrush (using toothpaste with fluoride): Yes   Dysphagia Treatment Treatment focused on: Skilled observation of diet tolerance;Utilization of compensatory strategies;Patient/family/caregiver  Dealer Educated: daughters Treatment Methods/Modalities: Skilled observation Patient observed directly with PO's: Yes Type of PO's observed: Nectar-thick liquids Feeding: Able to feed self Liquids provided via: Cup;No straw Type of cueing: Verbal Amount of cueing: Minimal        Royce Macadamia M.Ed [employer] [phone]   02/28/2013

## 2013-02-28 NOTE — Progress Notes (Addendum)
ANTICOAGULATION CONSULT NOTE - Follow Up Consult  Pharmacy Consult for Coumadin Indication: atrial fibrillation  Allergies  Allergen Reactions  . Metformin And Related Hives  . Penicillins Hives and Swelling    Patient Measurements: Height: 5\' 7"  (170.2 cm) Weight: 171 lb 4.8 oz (77.701 kg) IBW/kg (Calculated) : 66.1 Heparin Dosing Weight:    Vital Signs: Temp: 98.3 F (36.8 C) (03/13 0403) Temp src: Oral (03/13 0403) BP: 133/76 mmHg (03/13 0403) Pulse Rate: 95 (03/13 0403)  Labs:  Recent Labs  02/26/13 0500 02/26/13 1755 02/27/13 0530 02/28/13 0530  HGB 8.1*  --  7.7* 10.5*  HCT 23.8*  --  22.8* 30.7*  PLT 175  --  169 148*  LABPROT 20.1*  --  28.2* 43.8*  INR 1.78*  --  2.82* PENDING  HEPARINUNFRC  --  <0.10* 0.16*  --   CREATININE 2.28*  --  2.76* 2.07*    Estimated Creatinine Clearance: 23.5 ml/min (by C-G formula based on Cr of 2.07).  Assessment: Hip surgery 3/10.  AC: Afib + porcine AVR. Coumadin PTA (3mg  x 6days/week, 2.5mg  once weekly--corrected, SZP done). Vit K 0.5mg  given 3/9. Coumadin resumed post-op hip surgery 3/10.CHADS2=4. INR today: critical 5.1  Infectious Disease: Elevated WBC 10.7 (declining) and MD note mentions fever with possible PNA on 3/11 CXR. Scr acutely elevated from 1.65>>2.28>>2.76>>2.07. Abx started for PNA. No cultures. First dose 3/12 not charted. No new CXR since 3/11. 3/12 Vanco>> 3/12 Primaxin>>  Cardiovascular: Hx HTN, CHF, CAD, PVD (L BKA 11/13), Afib and porcine AVR. On ASA 81mg , Toprol, atorvastatin, amlodpine. BP 133/76, HR 95  Endocrinology: Hx DM. CBGs 187-246 (this am) . On SSI + Lantus resumed.  Gastrointestinal: Diet advanced to Dys 3 with mild dysphagia on swallow eval 3/12. Please note AST and ALT with acute elevations last 48 hrs. May need to consider holding Lipitor.  Neurology: On Cymbalta.   Nephrology: ARF improving (likely ATN from age, DM, HTN, PVD, hypotension, NSAIDS at home). SCr down to 2.07 with  UOP up to 1.2 ml/kg/hr last 24h  Ortho: THR 3/10  Pulmonary: 97% on 3L [place]  Hematology/Oncology: H/H 10.5 improved after blood x 2 on 3/12  PTA meds: Zocor Demadex  Goal of Therapy:  INR 2-3 Monitor platelets by anticoagulation protocol: Yes  Plan:  --Continue to hold Coumadin. Would recommend some Vitamin K to reverse upward trend in INR with post-op anemia. --Hold Lipitor for acute elevation in LFTs? --Changed Vanco to 1g IV q48hrs for ARF -- Change Primaxin to 250mg  IV q12hrs for ARF   Pasty Spillers 02/28/2013,8:22 AM

## 2013-03-01 ENCOUNTER — Inpatient Hospital Stay (HOSPITAL_COMMUNITY): Payer: Medicare Other

## 2013-03-01 DIAGNOSIS — E119 Type 2 diabetes mellitus without complications: Secondary | ICD-10-CM

## 2013-03-01 LAB — CBC
HCT: 30.4 % — ABNORMAL LOW (ref 39.0–52.0)
MCH: 27.2 pg (ref 26.0–34.0)
MCHC: 33.9 g/dL (ref 30.0–36.0)
RDW: 17.8 % — ABNORMAL HIGH (ref 11.5–15.5)

## 2013-03-01 LAB — COMPREHENSIVE METABOLIC PANEL
ALT: 961 U/L — ABNORMAL HIGH (ref 0–53)
AST: 585 U/L — ABNORMAL HIGH (ref 0–37)
Alkaline Phosphatase: 95 U/L (ref 39–117)
CO2: 24 mEq/L (ref 19–32)
GFR calc Af Amer: 41 mL/min — ABNORMAL LOW (ref >90)
GFR calc non Af Amer: 35 mL/min — ABNORMAL LOW (ref >90)
Glucose, Bld: 249 mg/dL — ABNORMAL HIGH (ref 70–99)
Potassium: 4.6 mEq/L (ref 3.5–5.1)
Sodium: 141 mEq/L (ref 135–145)

## 2013-03-01 LAB — GLUCOSE, CAPILLARY: Glucose-Capillary: 219 mg/dL — ABNORMAL HIGH (ref 70–99)

## 2013-03-01 MED ORDER — FUROSEMIDE 10 MG/ML IJ SOLN: 40.0000 mg | Freq: Once | INTRAMUSCULAR | Status: DC

## 2013-03-01 MED ORDER — INSULIN GLARGINE 100 UNIT/ML ~~LOC~~ SOLN
35.0000 [IU] | Freq: Every day | SUBCUTANEOUS | Status: DC
  Administered 2013-03-02: 35 [IU] via SUBCUTANEOUS

## 2013-03-01 MED ORDER — VANCOMYCIN HCL IN DEXTROSE 1-5 GM/200ML-% IV SOLN
1000.0000 mg | INTRAVENOUS | Status: DC
  Administered 2013-03-01: 1000 mg via INTRAVENOUS
  Filled 2013-03-01 (×2): qty 200

## 2013-03-01 MED ORDER — POLYETHYLENE GLYCOL 3350 17 G PO PACK
17.0000 g | PACK | Freq: Three times a day (TID) | ORAL | Status: DC
  Administered 2013-03-01 – 2013-03-02 (×3): 17 g via ORAL
  Filled 2013-03-01 (×6): qty 1

## 2013-03-01 MED ORDER — SODIUM CHLORIDE 0.9 % IV SOLN
250.0000 mg | Freq: Four times a day (QID) | INTRAVENOUS | Status: DC
  Administered 2013-03-01 – 2013-03-02 (×4): 250 mg via INTRAVENOUS
  Filled 2013-03-01 (×8): qty 250

## 2013-03-01 NOTE — Progress Notes (Signed)
PATIENT ID: Aaron [name]   4 Days Post-Op Procedure(s) (LRB): ARTHROPLASTY BIPOLAR HIP (Left)  Subjective: Reports pain in left hip with movement, otherwise comfortable. Reports he is more tired. No other complaints or concerns.   Objective:  Filed Vitals:   03/01/13 0507  BP: 151/84  Pulse: 95  Temp: 98.7 F (37.1 C)  Resp: 20     Awake, alert, orientated L hip dressing c/d/i Knee immobilizer in place  Labs:   Recent Labs  02/27/13 0530 02/28/13 0530 03/01/13 0545  HGB 7.7* 10.5* 10.3*   Recent Labs  02/28/13 0530 03/01/13 0545  WBC 10.7* 13.8*  RBC 3.84* 3.78*  HCT 30.7* 30.4*  PLT 148* 145*   Recent Labs  02/28/13 0530 03/01/13 0545  NA 137 141  K 4.4 4.6  CL 106 107  CO2 23 24  BUN 66* 54*  CREATININE 2.07* 1.68*  GLUCOSE 205* 249*  CALCIUM 8.0* 8.4    Assessment and Plan: 4 days s/p left hip hemiarthroplasty, ORIF GT fracture  NWB  OOB to chair when able  Due to complex nature of anticoagulation and patient's multiple comorbidites, will defer anticoagulation mgmt to primary team  Pain is well controlled, continue current pain mgmt   VTE proph: per primary team

## 2013-03-01 NOTE — Progress Notes (Signed)
Patient was non-compliant with NPO status overnight, drank some flavored water and refused to relinquish his water bottle.  Checked with radiology/US department this morning and this will not hinder the test from proceeding as scheduled.  Will continue to monitor.  Arva Chafe

## 2013-03-01 NOTE — Progress Notes (Addendum)
[facility] PROGRESS NOTE  Aaron [name] WUJ:[id number] DOB: 1924/09/04 DOA: 02/22/2013 PCP: Danella Penton., MD  Assessment/Plan: Principal Problem:   Hip fracture, left Active Problems:   Diabetes mellitus   A-fib   S/P CABG x 1   Aortic valve replaced   DNR (do not resuscitate)   1. Hip fracture-on anticoagulation, - INR still supratherapeutic today. Will hold Coumadin again tonight. No evidence of bleeding.  2. Slurred speech - family noted that he is more slurred today, although he is alert and oriented. CT scan ordered which was negative. I have asked speech to re-evaluate today.  3. Atrial fibrillation /aortic valve replacement with a porcine valve on Coumadin. 4. Prior history of CVA in the setting of subtherapeutic INR 5. Diabetes mellitus continue sliding scale insulin, CBG elevated, increase Lantus to 35 6. Acute on chronic kidney injury creatinine is gone up from 1.69-2.7, now improving, to baseline of about 1.05. Nephrology following, signed off 3/14. 7. Hyperkalemia - stable, better with improved renal function.  8. Fever, possible PNA RUL - continue vancomycin and Imipenem for hospital acquired. Plan for 10 days.   9. Anemia hemoglobin trending down, in the setting of renal insufficiency and low blood pressure, status post transfusion of 2 packed red blood cells 10. Transaminitis unclear etiology - RUQ [name] without major hepatic findings. Stopped his Tylenol and Tylenol containing compounds. Will continue to monitor.   Code Status: DO NOT RESUSCITATE  Family Communication: family updated about patient's clinical progress  Disposition Plan: SNF vs LTACH   Brief narrative:  Aaron [name] is an 79 y.o. male, he retired Public affairs consultant, lives at home with his daughter and son-in-law, history of atrial fibrillation on chronic anticoagulation, status post porcine aortic valve replacement, coronary bypass graft time 1, diabetes, hypertension, history of congestive heart  failure, peripheral vascular disease, status post left BKA, walking with his leg prosthesis, fell today without any loss of consciousness. His son in law was there and witnessed this mechanical fall. Evaluation in emergency room included a head CT which showed no acute process. At the time of his hip showed left femoral neck fracture with mild displacement. His INR is 2.23, white count 19.7 thousand, hemoglobin of 11.7. His creatinine is 1.7 with BUN of 61. Orthopedics was consulted by DEP and has seen the patient. Hospitalist was asked to admit him for hip fracture  Consultants:  Mable Paris, MD  Procedures:  none Antibiotics:  Vancomycin >>3/12 Primaxin 3/12 >>  Subjective: - no complaints this morning, however daughter is concerned about his speech.   Objective: Filed Vitals:   02/28/13 1025 02/28/13 1402 02/28/13 2059 03/01/13 0507  BP: 133/50 123/68 135/70 151/84  Pulse: 108 94 95 95  Temp:  98.3 F (36.8 C) 98.8 F (37.1 C) 98.7 F (37.1 C)  TempSrc:  Oral Oral Oral  Resp:  18 19 20   Height:      Weight:      SpO2:  97% 98% 100%    Intake/Output Summary (Last 24 hours) at 03/01/13 1016 Last data filed at 02/28/13 1900  Gross per 24 hour  Intake    480 ml  Output   1525 ml  Net  -1045 ml    Exam:  HENT:  Head: Atraumatic.  Nose: Nose normal.  Mouth/Throat: Oropharynx is clear and moist.  Eyes: Conjunctivae are normal. Pupils are equal, round, and reactive to light. No scleral icterus.  Neck: Neck supple. No tracheal deviation present.  Cardiovascular: Normal rate, regular rhythm,  normal heart sounds and intact distal pulses.  Pulmonary/Chest: Effort normal and breath sounds normal. No respiratory distress.  Abdominal: Soft. Normal appearance and bowel sounds are normal. She exhibits no distension. There is no tenderness.  Musculoskeletal: She exhibits no edema and no tenderness.  Neurological: somewhat slurred speech however tended to improve within  few minutes as patient was more alert. CN 2-12 grossly intact. MS mildly decreased LUE (at baseline s/p CVA last year)   Data Reviewed: Basic Metabolic Panel:  Recent Labs Lab 02/25/13 1203 02/26/13 0500 02/27/13 0530 02/28/13 0530 03/01/13 0545  NA 133* 137 134* 137 141  K 5.1 5.1 4.6 4.4 4.6  CL 98 102 102 106 107  CO2 18* 24 20 23 24   GLUCOSE 347* 330* 309* 205* 249*  BUN 64* 77* 83* 66* 54*  CREATININE 1.65* 2.28* 2.76* 2.07* 1.68*  CALCIUM 8.6 8.2* 7.7* 8.0* 8.4    Liver Function Tests:  Recent Labs Lab 02/22/13 2203 02/25/13 1203 02/27/13 0530 02/28/13 0530 03/01/13 0545  AST 24 17 1051* 568* 585*  ALT 20 13 944* 950* 961*  ALKPHOS 66 66 68 79 95  BILITOT 0.4 0.9 0.8 1.0 1.1  PROT 7.4 6.5 5.6* 5.8* 6.0  ALBUMIN 3.6 2.9* 2.7* 2.7* 2.7*   CBC:  Recent Labs Lab 02/22/13 2203  02/25/13 0900 02/26/13 0500 02/27/13 0530 02/28/13 0530 03/01/13 0545  WBC 19.7*  < > 16.3* 13.6* 13.0* 10.7* 13.8*  NEUTROABS 17.8*  --   --   --  10.9*  --   --   HGB 11.7*  < > 9.9* 8.1* 7.7* 10.5* 10.3*  HCT 36.0*  < > 30.3* 23.8* 22.8* 30.7* 30.4*  MCV 79.8  < > 79.3 77.5* 77.8* 79.9 80.4  PLT 260  < > 205 175 169 148* 145*  < > = values in this interval not displayed.  Cardiac Enzymes:  Recent Labs Lab 02/22/13 2203  TROPONINI <0.30   BNP (last 3 results)  Recent Labs  09/30/12 1233  PROBNP 29199.0*     CBG:  Recent Labs Lab 02/28/13 1138 02/28/13 1608 02/28/13 2100 03/01/13 0125 03/01/13 0634  GLUCAP 187* 188* 259* 219* 285*    Recent Results (from the past 240 hour(s))  SURGICAL PCR SCREEN     Status: None   Collection Time    02/23/13  5:15 AM      Result Value Range Status   MRSA, PCR NEGATIVE  NEGATIVE Final   Staphylococcus aureus NEGATIVE  NEGATIVE Final   Comment:            The Xpert SA Assay (FDA     approved for NASAL specimens     in patients over 63 years of age),     is one component of     a comprehensive surveillance      program.  Test performance has     been validated by [employer] for patients greater     than or equal to 40 year old.     It is not intended     to diagnose infection nor to     guide or monitor treatment.  URINE CULTURE     Status: None   Collection Time    02/27/13  3:50 AM      Result Value Range Status   Specimen Description URINE, CATHETERIZED   Final   Special Requests NONE   Final   Culture  Setup Time 02/27/2013 03:50  Final   Colony Count NO GROWTH   Final   Culture NO GROWTH   Final   Report Status 02/28/2013 FINAL   Final     Studies: Dg Chest 1 View  02/22/2013  *RADIOLOGY REPORT*  Clinical Data: Status post fall; concern for chest injury.  CHEST - 1 VIEW  Comparison: Chest radiograph performed 10/02/2012, and CT of the thoracic spine performed 10/02/2012  Findings: The lungs are well-aerated.  Mild vascular congestion is noted, without significant pulmonary edema.  There is no evidence of focal opacification, pleural effusion or pneumothorax.  The cardiomediastinal silhouette is borderline normal in size.  An aortic valve replacement is noted.  The patient is status post median sternotomy, with evidence of prior CABG.  No acute osseous abnormalities are seen.  IMPRESSION: Mild vascular congestion noted; lungs remain grossly clear.  No displaced rib fractures seen.   Original Report Authenticated By: Tonia Ghent, M.D.    Dg Hip Complete Left  02/22/2013  *RADIOLOGY REPORT*  Clinical Data: Status post fall; left hip pain.  LEFT HIP - COMPLETE 2+ VIEW  Comparison: None.  Findings: There appears to be a mildly displaced basicervical fracture through the left femoral neck; the fracture line is difficult to fully characterize, but there is abnormal alignment of the proximal left femur.  The right hip joint is grossly unremarkable in appearance.  Mild degenerative change is noted at the lower lumbar spine.  The sacroiliac joints are grossly unremarkable in appearance.  Diffuse  vascular calcifications are seen.  The visualized bowel gas pattern is grossly unremarkable.  IMPRESSION:  1.  Mildly displaced basicervical fracture through the left femoral neck. 2.  Diffuse vascular calcifications seen.   Original Report Authenticated By: Tonia Ghent, M.D.    Ct Head Wo Contrast  02/22/2013  *RADIOLOGY REPORT*  Clinical Data: Fall with hip pain.  CT HEAD WITHOUT CONTRAST  Technique:  Contiguous axial images were obtained from the base of the skull through the vertex without contrast.  Comparison: 09/27/2012  Findings: Bone windows demonstrate mucosal thickening of the left maxillary sinus. Clear mastoid air cells.  Soft tissue windows demonstrate expected cerebral atrophy.  Mild low density in the periventricular white matter likely related to small vessel disease.Posterior left frontal lobe nonacute infarct, including on image 22/series 2. No  mass lesion, hemorrhage, hydrocephalus, acute infarct, intra-axial, or extra-axial fluid collection.  IMPRESSION:  1. No acute intracranial abnormality. 2.  Cerebral atrophy with remote left posterior frontal lobe infarct.   Original Report Authenticated By: Jeronimo Greaves, M.D.    US Renal  02/26/2013  *RADIOLOGY REPORT*  Clinical Data: Acute renal failure  RENAL/URINARY TRACT ULTRASOUND COMPLETE  Comparison: Renal ultrasound 09/27/2012  Findings:  Right Kidney = 10.4 cm.  No evidence of hydronephrosis, cyst, mass, or stone.  Left kidney = 12.2 f cm.  No evidence of hydronephrosis, cyst, mass, or stone.  Bladder:  A Foley catheter in the bladder.  IMPRESSION:  No hydronephrosis.  No explanation for renal failure.   Original Report Authenticated By: Genevive Bi, M.D.    Dg Pelvis Portable  02/25/2013  *RADIOLOGY REPORT*  Clinical Data: Left hip prosthesis placement following a left femoral neck fracture.  PORTABLE PELVIS  Comparison: 02/22/2013.  Findings: Interval left hip prosthesis in satisfactory position and alignment.  No acute fracture or  dislocation.  IMPRESSION: Satisfactory postoperative appearance of the left hip prosthesis.   Original Report Authenticated By: Beckie Salts, M.D.    Dg Chest Port 1 View  02/26/2013  *  RADIOLOGY REPORT*  Clinical Data: Postop.  Evaluate for pneumonia  PORTABLE CHEST - 1 VIEW  Comparison: Chest radiograph 02/25/2013  Findings: The stable mildly enlarged heart silhouette.  Sternotomy wires and a valve replacement noted.  There is increased air space density in the right upper lobe and right lower lobe.  No pneumothorax.  IMPRESSION: Increased air space densities in the right upper lobe and right lower lobe suggesting edema or infection.   Original Report Authenticated By: Genevive Bi, M.D.    Dg Chest Port 1 View  02/25/2013  *RADIOLOGY REPORT*  Clinical Data: Clinical concern for pneumonia.  No reason given.  PORTABLE CHEST - 1 VIEW  Comparison: 02/22/2013.  Findings: Interval small amount of ill-defined patchy opacity in the right upper lobe.  Interval mild increase in prominence of the interstitial markings, with Kerley lines on the right.  Mildly progressive enlargement of the cardiac silhouette.  Stable post CABG changes and prosthetic heart valve.  Stable thoracolumbar vertebroplasty material and diffuse osteopenia.  IMPRESSION:  1.  Interval mild changes of congestive heart failure with a small amount of alveolar edema or pneumonia in the right upper lobe. 2.  Mildly progressive cardiomegaly.   Original Report Authenticated By: Beckie Salts, M.D.    Dg Swallowing Func-speech Pathology  02/27/2013  Berdine Dance, Student-SLP     02/27/2013  4:26 PM Objective Swallowing Evaluation: Modified Barium Swallowing Study   Patient Details  Name: Aaron [name] MRN: [id number] Date of Birth: [birth year]  Today's Date: 02/27/2013 Time: 1410-1430 SLP Time Calculation (min): 20 min  Past Medical History:  Past Medical History  Diagnosis Date  . Hypertension   . Diabetes mellitus   . CHF (congestive heart failure)   .  Coronary artery disease   . Atrial fibrillation   . PVD (peripheral vascular disease)   . S/P CABG x 1 2008  . S/P aortic valve replacement with porcine valve 2008   Past Surgical History:  Past Surgical History  Procedure Laterality Date  . Below knee leg amputation  07/2012  . Cataract extraction w/ intraocular lens implant    . Abdominal surgery    . Hernia repair      x5  . Back surgery      vertebroplasty  . Coronary artery bypass graft  2008  . Hip arthroplasty Left 02/25/2013    Procedure: ARTHROPLASTY BIPOLAR HIP;  Surgeon: Mable Paris, MD;  Location[facility] OR;  Service: Orthopedics;   Laterality: Left;   HPI:  Aaron [name] is an 79 y.o. 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 His son  in law was there and witnessed this mechanical fall. Evaluation  in emergency room included a head CT which showed no acute  process. CT of hip showed left femoral neck fracture with mild  displacement. S/p repair 3/10. Possible RUL PNA noted as well.  CXR : Increased air space densities in the right upper lobe and  right ower lobe suggesting edema or infection.     Assessment / Plan / Recommendation Clinical Impression  Dysphagia Diagnosis: Mild oral phase dysphagia;Moderate  pharyngeal phase dysphagia Clinical impression:  Patient presents with a moderate  oropharyngeal dysphagia  characterized by a delay in swallow  initiation due to reduced base of tongue retraction and reduced  a/p propulsion in concurrence with recent CVA in 10/13 (resulting  in dysphagia), decondtioning from recent hospital course, and  patients advanced age. This resulted  in penetration/aspiration of  thin liquids not ejected by spontaneous throat clear and/or  multiple swallows, however use of chin tuck strategy with nectar  thick liquids proved effective in adequate airway protection and  reducing occurence of penetration/aspiration. SLP provided max  cues for use of chin tuck (patient HOH), and recommends full  supervision for use of this compensatory strategy. SLP provided  patient and daughter with education and rationale for current  diet and encouraged her to contact SLP with any further  questions. SLP will f/u for diet tolerance.    Treatment Recommendation  Therapy as outlined in treatment plan below    Diet Recommendation Nectar-thick liquid;Dysphagia 3 (Mechanical  Soft)   Liquid Administration via: Cup;No straw Medication Administration: Whole meds with puree Supervision: Patient able to self feed;Full supervision/cueing  for compensatory strategies Compensations: Slow rate;Small sips/bites;Clear throat  intermittently Postural Changes and/or Swallow Maneuvers: Seated upright 90  degrees;Upright 30-60 min after meal;Chin tuck    Other  Recommendations Recommended Consults: MBS Oral Care Recommendations: Oral care BID Other Recommendations: Order thickener from pharmacy;Remove water  pitcher;Prohibited food (jello, ice cream, thin soups)   Follow Up Recommendations  Other (comment) (SLP to f/u at bedside)    Frequency and Duration min 2x/week  2 weeks   Pertinent Vitals/Pain None reported    SLP Swallow Goals Patient will utilize recommended strategies during swallow to  increase swallowing safety with: Supervision/safety Swallow Study Goal #2 - Progress:  (new goal)   General HPI: Aaron [name] is an 79 y.o. male, he retired  Public affairs consultant, lives at home with his daughter and  son-in-law, history of atrial fibrillation on chronic  anticoagulation, status post porcine aortic valve replacement,  coronary bypass graft time 1, diabetes, hypertension, history of   congestive heart failure, peripheral vascular disease, status  post left BKA, walking with his leg prosthesis, fell today  without any loss of consciousness. His son in law was there and  witnessed this mechanical fall. Evaluation in emergency room  included a head CT which showed no acute process. CT of hip  showed left femoral neck fracture with mild displacement. S/p  repair 3/10. Possible RUL PNA noted as well. CXR : Increased air  space densities in the right upper lobe and right ower lobe  suggesting edema or infection. Type of Study: Modified Barium Swallowing Study Reason for Referral: Objectively evaluate swallowing function Previous Swallow Assessment: MBS 09/2012, silent aspiration,  recommended dys 2, thin with chin tuck and multiple compensatory  strategies Diet Prior to this Study: Thin liquids (clear liquids) Temperature Spikes Noted: No Respiratory Status: Supplemental O2 delivered via (comment) History of Recent Intubation: Yes Length of Intubations (days):  (for recent surgery) Behavior/Cognition: Alert;Cooperative;Pleasant mood;Hard of  hearing;Requires cueing Oral Cavity - Dentition: Adequate natural dentition Oral Motor / Sensory Function: Within functional limits Self-Feeding Abilities: Able to feed self Patient Positioning: Upright in chair Baseline Vocal Quality: Clear Volitional Cough: Strong Volitional Swallow: Able to elicit Anatomy: Within functional limits Pharyngeal Secretions: Not observed secondary MBS    Reason for Referral Objectively evaluate swallowing function   Oral Phase Oral Preparation/Oral Phase Oral Phase: Impaired Oral - Nectar Oral - Nectar Teaspoon: Not tested (trace residue) Oral - Nectar Cup: Lingual/palatal residue Oral -  Thin Oral - Thin Teaspoon: Lingual/palatal residue;Reduced posterior  propulsion Oral - Thin Cup: Lingual/palatal residue;Reduced posterior  propulsion Oral - Thin Straw: Reduced posterior propulsion Oral - Solids Oral - Puree: Lingual/palatal  residue;Reduced posterior  propulsion Oral - Mechanical Soft: Lingual/palatal residue;Reduced posterior  propulsion (prolonged mastication, not uncommon given advanced  age)   Pharyngeal Phase Pharyngeal Phase Pharyngeal Phase: Impaired Pharyngeal - Nectar Pharyngeal - Nectar Teaspoon: Not tested Pharyngeal - Nectar Cup: Delayed swallow initiation;Premature  spillage to valleculae;Reduced tongue base  retraction;Penetration/Aspiration before swallow;Pharyngeal  residue - valleculae (trace residue) Penetration/Aspiration details (nectar cup): Material enters  airway, remains ABOVE vocal cords and not ejected out Pharyngeal - Thin Pharyngeal - Thin Teaspoon: Delayed swallow initiation;Premature  spillage to valleculae;Reduced tongue base  retraction;Penetration/Aspiration before swallow Penetration/Aspiration details (thin teaspoon): Material enters  airway, remains ABOVE vocal cords and not ejected out Pharyngeal - Thin Cup: Delayed swallow initiation;Premature  spillage to valleculae;Reduced tongue base  retraction;Penetration/Aspiration before swallow Penetration/Aspiration details (thin cup): Material enters  airway, CONTACTS cords and not ejected out Pharyngeal - Thin Straw: Delayed swallow initiation;Premature  spillage to valleculae;Premature spillage to pyriform  sinuses;Reduced tongue base retraction;Penetration/Aspiration  before swallow;Trace aspiration Penetration/Aspiration details (thin straw): Material enters  airway, passes BELOW cords and not ejected out despite cough  attempt by patient Pharyngeal - Solids Pharyngeal - Puree: Delayed swallow initiation;Premature spillage  to valleculae;Premature spillage to pyriform sinuses;Reduced  tongue base retraction;Pharyngeal residue - valleculae (trace  residue) Pharyngeal - Mechanical Soft: Delayed swallow  initiation;Premature spillage to valleculae;Premature spillage to  pyriform sinuses;Reduced tongue base retraction;Pharyngeal  residue - valleculae  (trace residue)  Cervical Esophageal Phase    GO   Berdine Dance SLP student            Berdine Dance 02/27/2013, 4:26 PM      Scheduled Meds: . chlorhexidine  60 mL Topical Once  . docusate sodium  100 mg Oral BID  . DULoxetine  30 mg Oral QHS  . feeding supplement  237 mL Oral BID BM  . imipenem-cilastatin  250 mg Intravenous Q6H  . insulin aspart  0-15 Units Subcutaneous Q4H  . insulin glargine  30 Units Subcutaneous QAC breakfast  . metoprolol succinate  12.5 mg Oral BID  . nystatin cream  1 application Topical QPM  . sodium chloride  3 mL Intravenous Q12H  . sodium chloride  3 mL Intravenous Q12H  . traZODone  25 mg Oral QHS  . vancomycin  1,000 mg Intravenous Once  . vancomycin  1,000 mg Intravenous Q36H  . Warfarin - Pharmacist Dosing Inpatient   Does not apply q1800   Continuous Infusions: . lactated ringers      Principal Problem:   Hip fracture, left Active Problems:   Diabetes mellitus   A-fib   S/P CABG x 1   Aortic valve replaced   DNR (do not resuscitate)  Time spent: 40 minutes   Pamella Pert  [facility] Pager [phone]. If 7PM-7AM, please contact night-coverage at [email or web address], password [facility] 03/01/2013, 10:16 AM  LOS: 7 days

## 2013-03-01 NOTE — Progress Notes (Signed)
Orthopedic Tech Progress Note Patient Details:  Aaron [name] [birth year] [id number] Applied overhead frame bed.     [name] 03/01/2013, 5:04 PM

## 2013-03-01 NOTE — Progress Notes (Signed)
ANTICOAGULATION/ANTIBIOTIC CONSULT NOTE - Follow Up Consult  Pharmacy Consult for Coumadin, Vancomycin + Primaxin Indication: atrial fibrillatin, PNA  Allergies  Allergen Reactions  . Metformin And Related Hives  . Penicillins Hives and Swelling    Patient Measurements: Height: 5\' 7"  (170.2 cm) Weight: 171 lb 4.8 oz (77.701 kg) IBW/kg (Calculated) : 66.1  Vital Signs: Temp: 98.7 F (37.1 C) (03/14 0507) Temp src: Oral (03/14 0507) BP: 151/84 mmHg (03/14 0507) Pulse Rate: 95 (03/14 0507)  Labs:  Recent Labs  02/26/13 1755  02/27/13 0530 02/28/13 0530 03/01/13 0545  HGB  --   < > 7.7* 10.5* 10.3*  HCT  --   --  22.8* 30.7* 30.4*  PLT  --   --  169 148* 145*  LABPROT  --   --  28.2* 43.8* 40.8*  INR  --   --  2.82* 5.10* 4.64*  HEPARINUNFRC <0.10*  --  0.16*  --   --   CREATININE  --   --  2.76* 2.07* 1.68*  < > = values in this interval not displayed.  Estimated Creatinine Clearance: 29 ml/min (by C-G formula based on Cr of 1.68).  Assessment: 89yom continues on coumadin with a supratherapeutic INR despite holding last night's dose. CBC low but stable. No bleeding reported.  Also continues on vancomycin and primaxin for pneumonia. ARF is improving with sCr down to 1.68 and CrCl 38ml/min. Will need to adjust antibiotic doses again for improved renal function.  3/12 Vanc>> 3/12 Primaxin>> 3/12 urine cx>>negative final  Goal of Therapy:  INR 2-3 Monitor platelets by anticoagulation protocol: Yes Vancomycin trough 15-20 Appropriate Primaxin dosing   Plan:  1) No coumadin tonight 2) Follow up INR in AM 3) Change vancomycin to 1g IV q36 4) Change primaxin to 250mg  IV q6 5) Continue to follow renal function and adjust antibiotics as necessary  Fredrik Rigger 03/01/2013,8:44 AM

## 2013-03-01 NOTE — Progress Notes (Signed)
[facility] - PROGRESS NOTE Resident Note   Please see below for attending addendum to resident note.  Subjective:   No acute events overnight, multiple PVCs on telemetry, no CP/no SOB, daughter at bedside with questions concerning edema after blood transfusion  Objective:    Vital Signs:   Temp:  [98.3 F (36.8 C)-98.8 F (37.1 C)] 98.7 F (37.1 C) (03/14 0507) Pulse Rate:  [94-108] 95 (03/14 0507) Resp:  [18-20] 20 (03/14 0507) BP: (123-151)/(50-84) 151/84 mmHg (03/14 0507) SpO2:  [97 %-100 %] 100 % (03/14 0507) Last BM Date: 02/22/13  24-hour weight change: Weight change:   Weight trends: Filed Weights   02/23/13 0110 02/27/13 0500  Weight: 150 lb 4.8 oz (68.176 kg) 171 lb 4.8 oz (77.701 kg)    Intake/Output:  03/13 0701 - 03/14 0700 In: 600 [P.O.:600] Out: 1525 [Urine:1525]  Physical Exam: General: Elderly White male, Well-developed, well-nourished, in no acute distress; alert, appropriate and cooperative throughout examination.  HEENT: NCAT, mucus membrane moist  Lungs:  Normal respiratory effort. Clear to auscultation BL without crackles or wheezes appreciated  Heart: IRRR. S1 and S2 normal without gallop, murmur, or rubs.  GU: Foley in place with ~150cc dark yellow urine  Abdomen:  BS normoactive. Soft, Nondistended, non-tender.  No masses or organomegaly.  Extremities: No pretibial edema R LE, L stump with brace in place inferior aspect w/o open drainage, wound or erythema     Labs: Basic Metabolic Panel:  Recent Labs Lab 02/27/13 0530 02/28/13 0530 03/01/13 0545  NA 134* 137 141  K 4.6 4.4 4.6  CL 102 106 107  CO2 20 23 24   GLUCOSE 309* 205* 249*  BUN 83* 66* 54*  CREATININE 2.76* 2.07* 1.68*  CALCIUM 7.7* 8.0* 8.4    Liver Function Tests:  Recent Labs Lab 02/27/13 0530 02/28/13 0530 03/01/13 0545  AST 1051* 568* 585*  ALT 944* 950* 961*  ALKPHOS 68 79 95  BILITOT 0.8 1.0 1.1  PROT 5.6* 5.8* 6.0  ALBUMIN 2.7* 2.7*  2.7*    CBC:  Recent Labs Lab 02/22/13 2203  02/27/13 0530 02/28/13 0530 03/01/13 0545  WBC 19.7*  < > 13.0* 10.7* 13.8*  NEUTROABS 17.8*  --  10.9*  --   --   HGB 11.7*  < > 7.7* 10.5* 10.3*  HCT 36.0*  < > 22.8* 30.7* 30.4*  MCV 79.8  < > 77.8* 79.9 80.4  PLT 260  < > 169 148* 145*  < > = values in this interval not displayed.  Cardiac Enzymes:  Recent Labs Lab 02/22/13 2203  TROPONINI <0.30    CBG:  Recent Labs Lab 02/28/13 1138 02/28/13 1608 02/28/13 2100 03/01/13 0125 03/01/13 0634  GLUCAP 187* 188* 259* 219* 285*    Microbiology: Results for orders placed during the hospital encounter of 02/22/13  SURGICAL PCR SCREEN     Status: None   Collection Time    02/23/13  5:15 AM      Result Value Range Status   MRSA, PCR NEGATIVE  NEGATIVE Final   Staphylococcus aureus NEGATIVE  NEGATIVE Final   Comment:            The Xpert SA Assay (FDA     approved for NASAL specimens     in patients over 41 years of age),     is one component of     a comprehensive surveillance     program.  Test performance has     been validated by  [facility] for patients greater     than or equal to 71 year old.     It is not intended     to diagnose infection nor to     guide or monitor treatment.  URINE CULTURE     Status: None   Collection Time    02/27/13  3:50 AM      Result Value Range Status   Specimen Description URINE, CATHETERIZED   Final   Special Requests NONE   Final   Culture  Setup Time 02/27/2013 03:50   Final   Colony Count NO GROWTH   Final   Culture NO GROWTH   Final   Report Status 02/28/2013 FINAL   Final    Coagulation Studies:  Recent Labs  02/27/13 0530 02/28/13 0530 03/01/13 0545  LABPROT 28.2* 43.8* 40.8*  INR 2.82* 5.10* 4.64*    Urinalysis:    Component Value Date/Time   COLORURINE AMBER* 02/27/2013 0350   APPEARANCEUR CLOUDY* 02/27/2013 0350   LABSPEC 1.020 02/27/2013 0350   PHURINE 5.0 02/27/2013 0350   GLUCOSEU NEGATIVE  02/27/2013 0350   HGBUR MODERATE* 02/27/2013 0350   BILIRUBINUR NEGATIVE 02/27/2013 0350   KETONESUR NEGATIVE 02/27/2013 0350   PROTEINUR NEGATIVE 02/27/2013 0350   UROBILINOGEN 1.0 02/27/2013 0350   NITRITE NEGATIVE 02/27/2013 0350   LEUKOCYTESUR LARGE* 02/27/2013 0350     Imaging: Dg Swallowing Func-speech Pathology  02/27/2013  Berdine Dance, Student-SLP     02/27/2013  4:26 PM Objective Swallowing Evaluation: Modified Barium Swallowing Study   Patient Details  Name: Jahzeel Poythress MRN: [id number] Date of Birth: 1924/01/13  Today's Date: 02/27/2013 Time: 1410-1430 SLP Time Calculation (min): 20 min  Past Medical History:  Past Medical History  Diagnosis Date  . Hypertension   . Diabetes mellitus   . CHF (congestive heart failure)   . Coronary artery disease   . Atrial fibrillation   . PVD (peripheral vascular disease)   . S/P CABG x 1 2008  . S/P aortic valve replacement with porcine valve 2008   Past Surgical History:  Past Surgical History  Procedure Laterality Date  . Below knee leg amputation  07/2012  . Cataract extraction w/ intraocular lens implant    . Abdominal surgery    . Hernia repair      x5  . Back surgery      vertebroplasty  . Coronary artery bypass graft  2008  . Hip arthroplasty Left 02/25/2013    Procedure: ARTHROPLASTY BIPOLAR HIP;  Surgeon: Mable Paris, MD;  Location: [facility] OR;  Service: Orthopedics;   Laterality: Left;   HPI:  Andyn Sales is an 79 y.o. 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 His son  in law was there and witnessed this mechanical fall. Evaluation  in emergency room included a head CT which  showed no acute  process. CT of hip showed left femoral neck fracture with mild  displacement. S/p repair 3/10. Possible RUL PNA noted as well.  CXR : Increased air space densities in the right upper lobe and  right ower lobe suggesting edema or infection.     Assessment / Plan /  Recommendation Clinical Impression  Dysphagia Diagnosis: Mild oral phase dysphagia;Moderate  pharyngeal phase dysphagia Clinical impression:  Patient presents with a moderate  oropharyngeal dysphagia characterized by a delay in swallow  initiation due to reduced base of tongue retraction and reduced  a/p propulsion in concurrence with recent CVA in 10/13 (resulting  in dysphagia), decondtioning from recent hospital course, and  patients advanced age. This resulted in penetration/aspiration of  thin liquids not ejected by spontaneous throat clear and/or  multiple swallows, however use of chin tuck strategy with nectar  thick liquids proved effective in adequate airway protection and  reducing occurence of penetration/aspiration. SLP provided max  cues for use of chin tuck (patient HOH), and recommends full  supervision for use of this compensatory strategy. SLP provided  patient and daughter with education and rationale for current  diet and encouraged her to contact SLP with any further  questions. SLP will f/u for diet tolerance.    Treatment Recommendation  Therapy as outlined in treatment plan below    Diet Recommendation Nectar-thick liquid;Dysphagia 3 (Mechanical  Soft)   Liquid Administration via: Cup;No straw Medication Administration: Whole meds with puree Supervision: Patient able to self feed;Full supervision/cueing  for compensatory strategies Compensations: Slow rate;Small sips/bites;Clear throat  intermittently Postural Changes and/or Swallow Maneuvers: Seated upright 90  degrees;Upright 30-60 min after meal;Chin tuck    Other  Recommendations Recommended Consults: MBS Oral Care Recommendations: Oral care BID Other  Recommendations: Order thickener from pharmacy;Remove water  pitcher;Prohibited food (jello, ice cream, thin soups)   Follow Up Recommendations  Other (comment) (SLP to f/u at bedside)    Frequency and Duration min 2x/week  2 weeks   Pertinent Vitals/Pain None reported    SLP Swallow Goals Patient will utilize recommended strategies during swallow to  increase swallowing safety with: Supervision/safety Swallow Study Goal #2 - Progress:  (new goal)   General HPI: Bronx Brogden is an 79 y.o. male, he retired  Public affairs consultant, lives at home with his daughter and  son-in-law, history of atrial fibrillation on chronic  anticoagulation, status post porcine aortic valve replacement,  coronary bypass graft time 1, diabetes, hypertension, history of  congestive heart failure, peripheral vascular disease, status  post left BKA, walking with his leg prosthesis, fell today  without any loss of consciousness. His son in law was there and  witnessed this mechanical fall. Evaluation in emergency room  included a head CT which showed no acute process. CT of hip  showed left femoral neck fracture with mild displacement. S/p  repair 3/10. Possible RUL PNA noted as well. CXR : Increased air  space densities in the right upper lobe and right ower lobe  suggesting edema or infection. Type of Study: Modified Barium Swallowing Study Reason for Referral: Objectively evaluate swallowing function Previous Swallow Assessment: MBS 09/2012, silent aspiration,  recommended dys 2, thin with chin tuck and multiple compensatory  strategies Diet Prior to this Study: Thin liquids (clear liquids) Temperature Spikes Noted: No Respiratory Status: Supplemental O2 delivered via (comment) History of Recent Intubation: Yes Length of Intubations (days):  (for recent surgery) Behavior/Cognition: Alert;Cooperative;Pleasant mood;Hard of  hearing;Requires cueing Oral Cavity - Dentition: Adequate natural dentition Oral Motor / Sensory Function: Within  functional limits Self-Feeding Abilities: Able to feed self Patient Positioning: Upright in chair Baseline Vocal Quality: Clear Volitional Cough: Strong Volitional Swallow: Able to elicit Anatomy: Within functional limits Pharyngeal Secretions: Not observed secondary MBS    Reason for Referral Objectively evaluate swallowing function   Oral  Phase Oral Preparation/Oral Phase Oral Phase: Impaired Oral - Nectar Oral - Nectar Teaspoon: Not tested (trace residue) Oral - Nectar Cup: Lingual/palatal residue Oral - Thin Oral - Thin Teaspoon: Lingual/palatal residue;Reduced posterior  propulsion Oral - Thin Cup: Lingual/palatal residue;Reduced posterior  propulsion Oral - Thin Straw: Reduced posterior propulsion Oral - Solids Oral - Puree: Lingual/palatal residue;Reduced posterior  propulsion Oral - Mechanical Soft: Lingual/palatal residue;Reduced posterior  propulsion (prolonged mastication, not uncommon given advanced  age)   Pharyngeal Phase Pharyngeal Phase Pharyngeal Phase: Impaired Pharyngeal - Nectar Pharyngeal - Nectar Teaspoon: Not tested Pharyngeal - Nectar Cup: Delayed swallow initiation;Premature  spillage to valleculae;Reduced tongue base  retraction;Penetration/Aspiration before swallow;Pharyngeal  residue - valleculae (trace residue) Penetration/Aspiration details (nectar cup): Material enters  airway, remains ABOVE vocal cords and not ejected out Pharyngeal - Thin Pharyngeal - Thin Teaspoon: Delayed swallow initiation;Premature  spillage to valleculae;Reduced tongue base  retraction;Penetration/Aspiration before swallow Penetration/Aspiration details (thin teaspoon): Material enters  airway, remains ABOVE vocal cords and not ejected out Pharyngeal - Thin Cup: Delayed swallow initiation;Premature  spillage to valleculae;Reduced tongue base  retraction;Penetration/Aspiration before swallow Penetration/Aspiration details (thin cup): Material enters  airway, CONTACTS cords and not ejected out Pharyngeal - Thin  Straw: Delayed swallow initiation;Premature  spillage to valleculae;Premature spillage to pyriform  sinuses;Reduced tongue base retraction;Penetration/Aspiration  before swallow;Trace aspiration Penetration/Aspiration details (thin straw): Material enters  airway, passes BELOW cords and not ejected out despite cough  attempt by patient Pharyngeal - Solids Pharyngeal - Puree: Delayed swallow initiation;Premature spillage  to valleculae;Premature spillage to pyriform sinuses;Reduced  tongue base retraction;Pharyngeal residue - valleculae (trace  residue) Pharyngeal - Mechanical Soft: Delayed swallow  initiation;Premature spillage to valleculae;Premature spillage to  pyriform sinuses;Reduced tongue base retraction;Pharyngeal  residue - valleculae (trace residue)  Cervical Esophageal Phase    GO   Berdine Dance SLP student            Berdine Dance 02/27/2013, 4:26 PM        Medications:    Infusions: . lactated ringers      Scheduled Medications: . chlorhexidine  60 mL Topical Once  . docusate sodium  100 mg Oral BID  . DULoxetine  30 mg Oral QHS  . feeding supplement  237 mL Oral BID BM  . furosemide  40 mg Intravenous Once  . imipenem-cilastatin  250 mg Intravenous Q12H  . insulin aspart  0-15 Units Subcutaneous Q4H  . insulin glargine  30 Units Subcutaneous QAC breakfast  . metoprolol succinate  12.5 mg Oral BID  . nystatin cream  1 application Topical QPM  . sodium chloride  3 mL Intravenous Q12H  . sodium chloride  3 mL Intravenous Q12H  . traZODone  25 mg Oral QHS  . vancomycin  750 mg Intravenous Q48H  . vancomycin  1,000 mg Intravenous Once  . Warfarin - Pharmacist Dosing Inpatient   Does not apply q1800    PRN Medications: acetaminophen, acetaminophen, alum & mag hydroxide-simeth, barrier cream, bisacodyl, diphenhydrAMINE, food thickener, HYDROcodone-acetaminophen, HYDROmorphone (DILAUDID) injection, menthol-cetylpyridinium, metoCLOPramide (REGLAN) injection, metoCLOPramide,  ondansetron (ZOFRAN) IV, ondansetron, oxyCODONE, oxyCODONE-acetaminophen, phenol, polyethylene glycol, sodium chloride   Assessment/ Plan:    1. Acute Kidney Injury on Chronic Kidney Disease Stage 3: pre-renal ARFsecondary to hypovolemia and hypotension. Pt was oliguric but responded to lasix with appropriate urine output.  UOP ~1.5l over past day. ~1L so far today. Creatinine level trending back down towards baseline at 1.68 today and suspect renal function will return to baseline. -cont IV hydration as tolerated -  cont strict  I/Os -no NSAIDS or contrast -Renal Service will sign off, please call with questions   Recent Labs Lab 02/25/13 1203 02/26/13 0500 02/27/13 0530 02/28/13 0530 03/01/13 0545  CREATININE 1.65* 2.28* 2.76* 2.07* 1.68*    2. Anemia: Hgb stable after transfusion 2 units PRBCs 02/27/13, no blood in stools, no hematoma evident, on Warfarin therapy  -cont to monitor for source of bleeding and transfuse as appropriate per primary team   Recent Labs Lab 02/25/13 0900 02/26/13 0500 02/27/13 0530 02/28/13 0530 03/01/13 0545  HGB 9.9* 8.1* 7.7* 10.5* 10.3*     3. Diabetes Mellitus: on insulin, management per primary team   CBG (last 3)   Recent Labs  02/28/13 2100 03/01/13 0125 03/01/13 0634  GLUCAP 259* 219* 285*    4. Atrial Fibrillation: stable on metoprolol and Warfarin, INR supratherapeutic -cont telemetry   Recent Labs Lab 02/25/13 0900 02/26/13 0500 02/27/13 0530 02/28/13 0530 03/01/13 0545  INR 1.41 1.78* 2.82* 5.10* 4.64*    5. Left hip greater trochanter fracture s/p ORIF: ~200 cc blood loss 02/22/2013; managed per Orthopedics  6. Code Status: DNR  7. DVT ppx: on Warfarin    Length of Stay: 7 days  Patient history and plan of care reviewed with attending, Dr. Allena Katz.   Manuela Schwartz, MD  PGYII, Internal Medicine Resident 03/01/2013, 7:53 AM  I have personally seen and examined this patient and agree with the  assessment/plan as outlined above by Bosie Clos MD (PGY2). Renal function improving- will give some lasix to assist with volume management.  Will sign off for now- please call with concerns.  PATEL,JAY K.,MD 03/01/2013 10:34 AM

## 2013-03-02 ENCOUNTER — Inpatient Hospital Stay
Admission: EM | Admit: 2013-03-02 | Discharge: 2013-03-22 | Disposition: A | Discharge status: Hospice | Payer: Medicare Other | Source: Ambulatory Visit | Attending: Internal Medicine | Admitting: Internal Medicine

## 2013-03-02 LAB — CBC
HCT: 30.3 % — ABNORMAL LOW (ref 39.0–52.0)
Hemoglobin: 10 g/dL — ABNORMAL LOW (ref 13.0–17.0)
MCH: 27.2 pg (ref 26.0–34.0)
MCHC: 33 g/dL (ref 30.0–36.0)
MCV: 82.6 fL (ref 78.0–100.0)
RBC: 3.67 MIL/uL — ABNORMAL LOW (ref 4.22–5.81)

## 2013-03-02 LAB — COMPREHENSIVE METABOLIC PANEL
ALT: 592 U/L — ABNORMAL HIGH (ref 0–53)
AST: 149 U/L — ABNORMAL HIGH (ref 0–37)
Albumin: 2.5 g/dL — ABNORMAL LOW (ref 3.5–5.2)
CO2: 25 mEq/L (ref 19–32)
Calcium: 8.4 mg/dL (ref 8.4–10.5)
Creatinine, Ser: 1.37 mg/dL — ABNORMAL HIGH (ref 0.50–1.35)
GFR calc non Af Amer: 45 mL/min — ABNORMAL LOW (ref >90)
Sodium: 138 mEq/L (ref 135–145)

## 2013-03-02 LAB — GLUCOSE, CAPILLARY: Glucose-Capillary: 270 mg/dL — ABNORMAL HIGH (ref 70–99)

## 2013-03-02 LAB — PROTIME-INR
INR: 5.1 (ref 0.00–1.49)
Prothrombin Time: 43.6 seconds — ABNORMAL HIGH (ref 11.6–15.2)

## 2013-03-02 MED ORDER — SODIUM CHLORIDE 0.9 % IV SOLN: 250.0000 mg | Freq: Four times a day (QID) | INTRAVENOUS | Status: DC

## 2013-03-02 MED ORDER — HYDRALAZINE HCL 20 MG/ML IJ SOLN
5.0000 mg | Freq: Four times a day (QID) | INTRAMUSCULAR | Status: DC | PRN
  Administered 2013-03-02: 5 mg via INTRAVENOUS
  Filled 2013-03-02: qty 0.25

## 2013-03-02 MED ORDER — METOPROLOL SUCCINATE ER 25 MG PO TB24: 25.0000 mg | ORAL_TABLET | Freq: Two times a day (BID) | ORAL | Status: AC

## 2013-03-02 MED ORDER — VANCOMYCIN HCL IN DEXTROSE 1-5 GM/200ML-% IV SOLN: 1000.0000 mg | Freq: Once | INTRAVENOUS | Status: AC

## 2013-03-02 MED ORDER — FLEET ENEMA 7-19 GM/118ML RE ENEM
1.0000 | ENEMA | Freq: Once | RECTAL | Status: AC
  Administered 2013-03-02: 1 via RECTAL
  Filled 2013-03-02: qty 1

## 2013-03-02 MED ORDER — POLYETHYLENE GLYCOL 3350 17 G PO PACK: 17.0000 g | PACK | Freq: Two times a day (BID) | ORAL | Status: AC

## 2013-03-02 MED ORDER — GLUCERNA SHAKE PO LIQD: 237.0000 mL | Freq: Two times a day (BID) | ORAL | Status: AC

## 2013-03-02 MED ORDER — METOPROLOL SUCCINATE ER 25 MG PO TB24
25.0000 mg | ORAL_TABLET | Freq: Two times a day (BID) | ORAL | Status: DC
  Administered 2013-03-02: 25 mg via ORAL
  Filled 2013-03-02 (×2): qty 1

## 2013-03-02 MED ORDER — SODIUM CHLORIDE 0.9 % IV SOLN: 250.0000 mg | Freq: Four times a day (QID) | INTRAVENOUS | Status: AC

## 2013-03-02 NOTE — Progress Notes (Signed)
5 Days Post-Op L Hip Bipolar Arthroplasty, ORIF GT fx, reports px with movement   BP 166/99  Pulse 111  Temp(Src) 98.5 F (36.9 C) (Oral)  Resp 19  Ht 5\' 7"  (1.702 m)  Wt 77.701 kg (171 lb 4.8 oz)  BMI 26.82 kg/m2  SpO2 98%   L hip dressing C/D/I  Knee immobilizer in place  Abnormal Labs: WBC: 14.4 (increase from 10.7 on 3/13), RBC: 3.67, HGB: 10.0, HCT: 30.3, RDW: 18.6, BUN: 41, Cr: 1.37, INR: 5.10   Assessment and Plan:  -5 days s/p left hip hemiarthroplasty, ORIF GT fracture  -NWB (no prosthetic use on BKA for 6wks)  -OOB to chair when able   -Due to complex nature of anticoagulation and patient's multiple comorbidites, will defer anticoagulation mgmt to primary team, currently INR is high at 5.1 -Pain is well controlled, continue current pain mgmt  -VTE proph: per primary team - patient constipated, may need enema

## 2013-03-02 NOTE — Progress Notes (Signed)
Patient's BP 166/99 this morning and HR 111.  Notified NP on-call and received order for IV Hydralazine prn.  Medicine will likely arrive from pharmacy after shift change, will pass on to day-shift RN.  Arva Chafe

## 2013-03-02 NOTE — Progress Notes (Signed)
CRITICAL LAB:  Panic INR 5.10 this morning.  Notified MD through text-page.  Will pass on info to day-shift RN.  Arva Chafe

## 2013-03-02 NOTE — Discharge Summary (Signed)
Physician Discharge Summary  Aaron [name] RUE:[id number] DOB: 11/27/1924 DOA: 02/22/2013  PCP: Danella Penton., MD  Admit date: 02/22/2013 Discharge date: 03/02/2013  Time spent: 45 minutes  Recommendations for Outpatient Follow-up:  1. Please continue to monitor INR, LFTs and creatinine function 2. Please continue Primaxin and Vancomycin for a total of 8 days for hospital acquired pneumonia 3/12 - 3/20, per pharmacy.  3. Continue Coumadin once INR is within therapeutic range with goal 2-3.   Dicharge Diagnoses:  Principal Problem:   Hip fracture, left Active Problems:   Diabetes mellitus   A-fib   S/P CABG x 1   Aortic valve replaced   DNR (do not resuscitate)  Discharge Condition: stable  Diet recommendation: heart healthy  Filed Weights   02/23/13 0110 02/27/13 0500  Weight: 68.176 kg (150 lb 4.8 oz) 77.701 kg (171 lb 4.8 oz)    History of present illness:   Hospital Course:  1. Hip fracture - s/p left hip bipolar arthroplasty, doing well postop. He is on chronic anticoagulation for his A fib. Currently patient is supra therapeutic and has been for the past few days, I suspect this might be related to the current antibiotics use. Patient has no evidence of bleeding, and his hemoglobin has been stable. I recommend that his Coumadin is being held onto his INR is therapeutic again, with goal INR of 2-3 2. Slurred speech - family noted that he is more slurred on 03/01/2013, resolved. CT scan was negative for acute infarct, hemorrhage or mass. 3. Atrial fibrillation /aortic valve replacement with a porcine valve on Coumadin. I have increased his metoprolol to 25 mg twice daily to achieve a goal heart rate of 90 beats per minute or less. You can titrate the metoprolol even further higher, to achieve his previous home dose, if his blood pressure tolerates. 4. Prior history of CVA in the setting of subtherapeutic INR. Consider restarting his aspirin, once his INR is  therapeutic. 5. Diabetes mellitus continue sliding scale insulin and Lantus.  6. Acute on chronic kidney injury creatinine is gone up from 1.69-2.7, now improving. Nephrology was consulted while patient was here and they have been following initially, they have signed off 3/14. His creatinine today is 1.3, continuing improvement.  7. Hyperkalemia - stable, better with improved renal function.  8. Fever, possible PNA RUL - continue vancomycin and Primaxin for hospital acquired. Plan for 8 days total pending clinical stability.  9. Anemia hemoglobin trending down, in the setting of renal insufficiency and low blood pressure, status post transfusion of 2 packed red blood cells. His hemoglobin has been stable following the transfusion. 10. Transaminitis - RUQ [name] without major hepatic findings, this might be related to shock liver following his surgery. I stopped his Tylenol and Tylenol containing compounds. By 03/02/2013, his liver enzymes have improved significantly. His AST is 149 down from 585, and his ALT is 592 down from 961. Continue to monitor LFTs daily until normalized. Consider starting his Tylenol when his LFTs return to normal.  Procedures:  OR 3/10 as above  Consultations:  Ortho  Nephrology  Discharge Exam: Filed Vitals:   03/01/13 1406 03/01/13 1943 03/02/13 0427 03/02/13 1035  BP: 145/67 144/82 166/99 131/74  Pulse: 98 109 111   Temp: 99.4 F (37.4 C) 98.9 F (37.2 C) 98.5 F (36.9 C)   TempSrc: Oral Oral Oral   Resp: 18 19 19    Height:      Weight:      SpO2: 96% 97% 98%  General: NAD Cardiovascular: Irregular, no MRG Respiratory: CTA biL  Discharge Instructions  Discharge Orders   Future Orders Complete By Expires     Non weight bearing  As directed     Scheduling Instructions:      L LE        Medication List    STOP taking these medications       aspirin 81 MG EC tablet     TYLENOL PM EXTRA STRENGTH PO      TAKE these medications        amLODipine 5 MG tablet  Commonly known as:  NORVASC  Take 5 mg by mouth every morning.     barrier cream Crea  Commonly known as:  non-specified  Apply 1 application topically at bedtime as needed (for bed sores).     CALCIUM 600+D 600-400 MG-UNIT per tablet  Generic drug:  Calcium Carbonate-Vitamin D  Take 1 tablet by mouth every morning.     DULCOLAX PO  Take 1 tablet by mouth daily as needed (for constipation).     DULoxetine 30 MG capsule  Commonly known as:  CYMBALTA  Take 30 mg by mouth at bedtime.     feeding supplement Liqd  Take 237 mLs by mouth 2 (two) times daily between meals.     HYDROcodone-acetaminophen 5-325 MG per tablet  Commonly known as:  NORCO  Take 1 tablet by mouth every 6 (six) hours as needed for pain.     insulin glargine 100 UNIT/ML injection  Commonly known as:  LANTUS  Inject 30 Units into the skin daily before breakfast.     insulin lispro 100 UNIT/ML injection  Commonly known as:  HUMALOG  Inject 3-5 Units into the skin 3 (three) times daily before meals. 5 units before breakfast, before lunch and 3 units before supper     metoprolol succinate 25 MG 24 hr tablet  Commonly known as:  TOPROL-XL  Take 1 tablet (25 mg total) by mouth 2 (two) times daily.     nystatin cream  Commonly known as:  MYCOSTATIN  Apply 1 application topically every evening. To groin     polyethylene glycol packet  Commonly known as:  MIRALAX / GLYCOLAX  Take 17 g by mouth 2 (two) times daily.     potassium chloride 10 MEQ tablet  Commonly known as:  K-DUR,KLOR-CON  Take 5-10 mEq by mouth 2 (two) times daily. Pt takes 10 meq with torsemide in the a.m. And 5 meq at night with the torsemide     simvastatin 40 MG tablet  Commonly known as:  ZOCOR  Take 40 mg by mouth every evening.     sodium chloride 0.9 % SOLN 100 mL with imipenem-cilastatin 250 MG SOLR 250 mg  Inject 250 mg into the vein every 6 (six) hours.     torsemide 20 MG tablet  Commonly known as:   DEMADEX  Take 20 mg by mouth 2 (two) times daily.     traZODone 50 MG tablet  Commonly known as:  DESYREL  Take 25 mg by mouth at bedtime.     vancomycin 1 GM/200ML Soln  Commonly known as:  VANCOCIN  Inject 200 mLs (1,000 mg total) into the vein once.     Vitamin D-3 1000 UNITS Caps  Take 1,000 Units by mouth every morning.     Vitamins B1 B6 B12 Liqd  Take 5 mLs by mouth daily at 12 noon.     warfarin 3 MG tablet  Commonly known as:  COUMADIN  Take 2.5-3 mg by mouth daily. Patient takes 3 mg every day except one day on which he takes 2.5mg  (Daughter didn't know which day the 2.5mg  dose was)           Follow-up Information   Follow up with Mable Paris, MD. Schedule an appointment as soon as possible for a visit in 2 weeks.   Contact information:   [address] [place] [address] [phone]        The results of significant diagnostics from this hospitalization (including imaging, microbiology, ancillary and laboratory) are listed below for reference.    Significant Diagnostic Studies: Dg Chest 1 View  02/22/2013  *RADIOLOGY REPORT*  Clinical Data: Status post fall; concern for chest injury.  CHEST - 1 VIEW  Comparison: Chest radiograph performed 10/02/2012, and CT of the thoracic spine performed 10/02/2012  Findings: The lungs are well-aerated.  Mild vascular congestion is noted, without significant pulmonary edema.  There is no evidence of focal opacification, pleural effusion or pneumothorax.  The cardiomediastinal silhouette is borderline normal in size.  An aortic valve replacement is noted.  The patient is status post median sternotomy, with evidence of prior CABG.  No acute osseous abnormalities are seen.  IMPRESSION: Mild vascular congestion noted; lungs remain grossly clear.  No displaced rib fractures seen.   Original Report Authenticated By: Tonia Ghent, M.D.    Dg Hip Complete Left  02/22/2013  *RADIOLOGY REPORT*  Clinical Data:  Status post fall; left hip pain.  LEFT HIP - COMPLETE 2+ VIEW  Comparison: None.  Findings: There appears to be a mildly displaced basicervical fracture through the left femoral neck; the fracture line is difficult to fully characterize, but there is abnormal alignment of the proximal left femur.  The right hip joint is grossly unremarkable in appearance.  Mild degenerative change is noted at the lower lumbar spine.  The sacroiliac joints are grossly unremarkable in appearance.  Diffuse vascular calcifications are seen.  The visualized bowel gas pattern is grossly unremarkable.  IMPRESSION:  1.  Mildly displaced basicervical fracture through the left femoral neck. 2.  Diffuse vascular calcifications seen.   Original Report Authenticated By: Tonia Ghent, M.D.    Ct Head Wo Contrast  03/01/2013  *RADIOLOGY REPORT*  Clinical Data: Slurred speech  CT HEAD WITHOUT CONTRAST  Technique:  Contiguous axial images were obtained from the base of the skull through the vertex without contrast.  Comparison: CT 02/22/2013  Findings: Moderate atrophy.  Chronic infarct in the high left parietal lobe is unchanged.  Chronic infarct right frontal lobe. Chronic ischemia in the white matter.  Negative for acute infarct.  Negative for hemorrhage or mass lesion.  No midline shift.  Atherosclerotic calcification in the carotid and vertebral arteries.  No skull abnormality.  Small air-fluid level left sphenoid sinus.  IMPRESSION: Atrophy and chronic ischemia.  No acute infarct hemorrhage or mass.  Small air-fluid level sphenoid sinus.   Original Report Authenticated By: Janeece Riggers, M.D.    Ct Head Wo Contrast  02/22/2013  *RADIOLOGY REPORT*  Clinical Data: Fall with hip pain.  CT HEAD WITHOUT CONTRAST  Technique:  Contiguous axial images were obtained from the base of the skull through the vertex without contrast.  Comparison: 09/27/2012  Findings: Bone windows demonstrate mucosal thickening of the left maxillary sinus. Clear mastoid  air cells.  Soft tissue windows demonstrate expected cerebral atrophy.  Mild low density in the periventricular white matter likely related  to small vessel disease.Posterior left frontal lobe nonacute infarct, including on image 22/series 2. No  mass lesion, hemorrhage, hydrocephalus, acute infarct, intra-axial, or extra-axial fluid collection.  IMPRESSION:  1. No acute intracranial abnormality. 2.  Cerebral atrophy with remote left posterior frontal lobe infarct.   Original Report Authenticated By: Jeronimo Greaves, M.D.    US Abdomen Complete  03/01/2013  *RADIOLOGY REPORT*  Clinical Data:  Abnormal liver function tests.  COMPLETE ABDOMINAL ULTRASOUND  Comparison:  None.  Findings: Bilateral pleural effusions.  Gallbladder:  No gallstones, gallbladder wall thickening, or pericholecystic fluid. Negative sonographic Murphy's sign.  Common bile duct:  Normal.  4 mm diameter.  Liver:  No focal lesions.  Normal size.  IVC:  Normal.  Pancreas:  Not visualized.  Prior CT scan of the thoracic spine and 10/02/2012 demonstrates an atrophic pancreas.  Spleen:  Normal size, 5.8 cm in length.  Vascular calcifications throughout the spleen was confirmed on the prior CT scan.  Right Kidney:  Normal.  11.0 cm in length.  Left Kidney:  Normal.  11.2 cm in length.  Abdominal aorta:  Maximum diameter 1.8 cm.  IMPRESSION: No significant abdominal abnormalities.  The pancreas is not visible was demonstrated to be atrophic on prior CT scan.  Bilateral pleural effusions.   Original Report Authenticated By: Francene Boyers, M.D.    US Renal  02/26/2013  *RADIOLOGY REPORT*  Clinical Data: Acute renal failure  RENAL/URINARY TRACT ULTRASOUND COMPLETE  Comparison: Renal ultrasound 09/27/2012  Findings:  Right Kidney = 10.4 cm.  No evidence of hydronephrosis, cyst, mass, or stone.  Left kidney = 12.2 f cm.  No evidence of hydronephrosis, cyst, mass, or stone.  Bladder:  A Foley catheter in the bladder.  IMPRESSION:  No hydronephrosis.  No  explanation for renal failure.   Original Report Authenticated By: Genevive Bi, M.D.    Dg Pelvis Portable  02/25/2013  *RADIOLOGY REPORT*  Clinical Data: Left hip prosthesis placement following a left femoral neck fracture.  PORTABLE PELVIS  Comparison: 02/22/2013.  Findings: Interval left hip prosthesis in satisfactory position and alignment.  No acute fracture or dislocation.  IMPRESSION: Satisfactory postoperative appearance of the left hip prosthesis.   Original Report Authenticated By: Beckie Salts, M.D.    Dg Chest Port 1 View  02/26/2013  *RADIOLOGY REPORT*  Clinical Data: Postop.  Evaluate for pneumonia  PORTABLE CHEST - 1 VIEW  Comparison: Chest radiograph 02/25/2013  Findings: The stable mildly enlarged heart silhouette.  Sternotomy wires and a valve replacement noted.  There is increased air space density in the right upper lobe and right lower lobe.  No pneumothorax.  IMPRESSION: Increased air space densities in the right upper lobe and right lower lobe suggesting edema or infection.   Original Report Authenticated By: Genevive Bi, M.D.    Dg Chest Port 1 View  02/25/2013  *RADIOLOGY REPORT*  Clinical Data: Clinical concern for pneumonia.  No reason given.  PORTABLE CHEST - 1 VIEW  Comparison: 02/22/2013.  Findings: Interval small amount of ill-defined patchy opacity in the right upper lobe.  Interval mild increase in prominence of the interstitial markings, with Kerley lines on the right.  Mildly progressive enlargement of the cardiac silhouette.  Stable post CABG changes and prosthetic heart valve.  Stable thoracolumbar vertebroplasty material and diffuse osteopenia.  IMPRESSION:  1.  Interval mild changes of congestive heart failure with a small amount of alveolar edema or pneumonia in the right upper lobe. 2.  Mildly progressive cardiomegaly.   Original  Report Authenticated By: Beckie Salts, M.D.    Dg Swallowing [facility] Pathology  02/27/2013  Berdine Dance, Student-SLP      02/27/2013  4:26 PM Objective Swallowing Evaluation: Modified Barium Swallowing Study   Patient Details  Name: Aaron [name] MRN: [id number] Date of Birth: 1924/03/07  Today's Date: 02/27/2013 Time: 1410-1430 SLP Time Calculation (min): 20 min  Past Medical History:  Past Medical History  Diagnosis Date  . Hypertension   . Diabetes mellitus   . CHF (congestive heart failure)   . Coronary artery disease   . Atrial fibrillation   . PVD (peripheral vascular disease)   . S/P CABG x 1 2008  . S/P aortic valve replacement with porcine valve 2008   Past Surgical History:  Past Surgical History  Procedure Laterality Date  . Below knee leg amputation  07/2012  . Cataract extraction w/ intraocular lens implant    . Abdominal surgery    . Hernia repair      x5  . Back surgery      vertebroplasty  . Coronary artery bypass graft  2008  . Hip arthroplasty Left 02/25/2013    Procedure: ARTHROPLASTY BIPOLAR HIP;  Surgeon: Mable Paris, MD;  [facility] OR;  Service: Orthopedics;   Laterality: Left;   HPI:  Aaron [name] is an 79 y.o. 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 His son  in law was there and witnessed this mechanical fall. Evaluation  in emergency room included a head CT which showed no acute  process. CT of hip showed left femoral neck fracture with mild  displacement. S/p repair 3/10. Possible RUL PNA noted as well.  CXR : Increased air space densities in the right upper lobe and  right ower lobe suggesting edema or infection.     Assessment / Plan / Recommendation Clinical Impression  Dysphagia Diagnosis: Mild oral phase dysphagia;Moderate   pharyngeal phase dysphagia Clinical impression:  Patient presents with a moderate  oropharyngeal dysphagia characterized by a delay in swallow  initiation due to reduced base of tongue retraction and reduced  a/p propulsion in concurrence with recent CVA in 10/13 (resulting  in dysphagia), decondtioning from recent hospital course, and  patients advanced age. This resulted in penetration/aspiration of  thin liquids not ejected by spontaneous throat clear and/or  multiple swallows, however use of chin tuck strategy with nectar  thick liquids proved effective in adequate airway protection and  reducing occurence of penetration/aspiration. SLP provided max  cues for use of chin tuck (patient HOH), and recommends full  supervision for use of this compensatory strategy. SLP provided  patient and daughter with education and rationale for current  diet and encouraged her to contact SLP with any further  questions. SLP will f/u for diet tolerance.    Treatment Recommendation  Therapy as outlined in treatment plan below    Diet Recommendation Nectar-thick liquid;Dysphagia 3 (Mechanical  Soft)   Liquid Administration via: Cup;No straw Medication Administration: Whole meds with puree Supervision: Patient able to self feed;Full supervision/cueing  for compensatory strategies Compensations: Slow rate;Small sips/bites;Clear throat  intermittently Postural Changes and/or Swallow Maneuvers: Seated upright 90  degrees;Upright 30-60 min after meal;Chin  tuck    Other  Recommendations Recommended Consults: MBS Oral Care Recommendations: Oral care BID Other Recommendations: Order thickener from pharmacy;Remove water  pitcher;Prohibited food (jello, ice cream, thin soups)   Follow Up Recommendations  Other (comment) (SLP to f/u at bedside)    Frequency and Duration min 2x/week  2 weeks   Pertinent Vitals/Pain None reported    SLP Swallow Goals Patient will utilize recommended strategies during swallow to  increase swallowing safety with:  Supervision/safety Swallow Study Goal #2 - Progress:  (new goal)   General HPI: Aaron [name] is an 79 y.o. male, he retired  Public affairs consultant, lives at home with his daughter and  son-in-law, history of atrial fibrillation on chronic  anticoagulation, status post porcine aortic valve replacement,  coronary bypass graft time 1, diabetes, hypertension, history of  congestive heart failure, peripheral vascular disease, status  post left BKA, walking with his leg prosthesis, fell today  without any loss of consciousness. His son in law was there and  witnessed this mechanical fall. Evaluation in emergency room  included a head CT which showed no acute process. CT of hip  showed left femoral neck fracture with mild displacement. S/p  repair 3/10. Possible RUL PNA noted as well. CXR : Increased air  space densities in the right upper lobe and right ower lobe  suggesting edema or infection. Type of Study: Modified Barium Swallowing Study Reason for Referral: Objectively evaluate swallowing function Previous Swallow Assessment: MBS 09/2012, silent aspiration,  recommended dys 2, thin with chin tuck and multiple compensatory  strategies Diet Prior to this Study: Thin liquids (clear liquids) Temperature Spikes Noted: No Respiratory Status: Supplemental O2 delivered via (comment) History of Recent Intubation: Yes Length of Intubations (days):  (for recent surgery) Behavior/Cognition: Alert;Cooperative;Pleasant mood;Hard of  hearing;Requires cueing Oral Cavity - Dentition: Adequate natural dentition Oral Motor / Sensory Function: Within functional limits Self-Feeding Abilities: Able to feed self Patient Positioning: Upright in chair Baseline Vocal Quality: Clear Volitional Cough: Strong Volitional Swallow: Able to elicit Anatomy: Within functional limits Pharyngeal Secretions: Not observed secondary MBS    Reason for Referral Objectively evaluate swallowing function   Oral Phase Oral Preparation/Oral Phase Oral Phase:  Impaired Oral - Nectar Oral - Nectar Teaspoon: Not tested (trace residue) Oral - Nectar Cup: Lingual/palatal residue Oral - Thin Oral - Thin Teaspoon: Lingual/palatal residue;Reduced posterior  propulsion Oral - Thin Cup: Lingual/palatal residue;Reduced posterior  propulsion Oral - Thin Straw: Reduced posterior propulsion Oral - Solids Oral - Puree: Lingual/palatal residue;Reduced posterior  propulsion Oral - Mechanical Soft: Lingual/palatal residue;Reduced posterior  propulsion (prolonged mastication, not uncommon given advanced  age)   Pharyngeal Phase Pharyngeal Phase Pharyngeal Phase: Impaired Pharyngeal - Nectar Pharyngeal - Nectar Teaspoon: Not tested Pharyngeal - Nectar Cup: Delayed swallow initiation;Premature  spillage to valleculae;Reduced tongue base  retraction;Penetration/Aspiration before swallow;Pharyngeal  residue - valleculae (trace residue) Penetration/Aspiration details (nectar cup): Material enters  airway, remains ABOVE vocal cords and not ejected out Pharyngeal - Thin Pharyngeal - Thin Teaspoon: Delayed swallow initiation;Premature  spillage to valleculae;Reduced tongue base  retraction;Penetration/Aspiration before swallow Penetration/Aspiration details (thin teaspoon): Material enters  airway, remains ABOVE vocal cords and not ejected out Pharyngeal - Thin Cup: Delayed swallow initiation;Premature  spillage to valleculae;Reduced tongue base  retraction;Penetration/Aspiration before swallow Penetration/Aspiration details (thin cup): Material enters  airway, CONTACTS cords and not ejected out Pharyngeal - Thin Straw: Delayed swallow initiation;Premature  spillage to valleculae;Premature spillage to pyriform  sinuses;Reduced tongue base retraction;Penetration/Aspiration  before swallow;Trace aspiration Penetration/Aspiration details (thin  straw): Material enters  airway, passes BELOW cords and not ejected out despite cough  attempt by patient Pharyngeal - Solids Pharyngeal - Puree: Delayed  swallow initiation;Premature spillage  to valleculae;Premature spillage to pyriform sinuses;Reduced  tongue base retraction;Pharyngeal residue - valleculae (trace  residue) Pharyngeal - Mechanical Soft: Delayed swallow  initiation;Premature spillage to valleculae;Premature spillage to  pyriform sinuses;Reduced tongue base retraction;Pharyngeal  residue - valleculae (trace residue)  Cervical Esophageal Phase    GO   Berdine Dance SLP student            Berdine Dance 02/27/2013, 4:26 PM      Microbiology: Recent Results (from the past 240 hour(s))  SURGICAL PCR SCREEN     Status: None   Collection Time    02/23/13  5:15 AM      Result Value Range Status   MRSA, PCR NEGATIVE  NEGATIVE Final   Staphylococcus aureus NEGATIVE  NEGATIVE Final   Comment:            The Xpert SA Assay (FDA     approved for NASAL specimens     in patients over 6 years of age),     is one component of     a comprehensive surveillance     program.  Test performance has     been validated by [employer] for patients greater     than or equal to 13 year old.     It is not intended     to diagnose infection nor to     guide or monitor treatment.  URINE CULTURE     Status: None   Collection Time    02/27/13  3:50 AM      Result Value Range Status   Specimen Description URINE, CATHETERIZED   Final   Special Requests NONE   Final   Culture  Setup Time 02/27/2013 03:50   Final   Colony Count NO GROWTH   Final   Culture NO GROWTH   Final   Report Status 02/28/2013 FINAL   Final     Labs: Basic Metabolic Panel:  Recent Labs Lab 02/26/13 0500 02/27/13 0530 02/28/13 0530 03/01/13 0545 03/02/13 0530  NA 137 134* 137 141 138  K 5.1 4.6 4.4 4.6 4.3  CL 102 102 106 107 104  CO2 24 20 23 24 25   GLUCOSE 330* 309* 205* 249* 247*  BUN 77* 83* 66* 54* 41*  CREATININE 2.28* 2.76* 2.07* 1.68* 1.37*  CALCIUM 8.2* 7.7* 8.0* 8.4 8.4   Liver Function Tests:  Recent Labs Lab 02/25/13 1203 02/27/13 0530  02/28/13 0530 03/01/13 0545 03/02/13 0530  AST 17 1051* 568* 585* 149*  ALT 13 944* 950* 961* 592*  ALKPHOS 66 68 79 95 96  BILITOT 0.9 0.8 1.0 1.1 1.4*  PROT 6.5 5.6* 5.8* 6.0 6.0  ALBUMIN 2.9* 2.7* 2.7* 2.7* 2.5*   CBC:  Recent Labs Lab 02/26/13 0500 02/27/13 0530 02/28/13 0530 03/01/13 0545 03/02/13 0530  WBC 13.6* 13.0* 10.7* 13.8* 14.4*  NEUTROABS  --  10.9*  --   --   --   HGB 8.1* 7.7* 10.5* 10.3* 10.0*  HCT 23.8* 22.8* 30.7* 30.4* 30.3*  MCV 77.5* 77.8* 79.9 80.4 82.6  PLT 175 169 148* 145* 166   BNP: BNP (last 3 results)  Recent Labs  09/30/12 1233  PROBNP 29199.0*   CBG:  Recent Labs Lab 03/01/13 1301 03/01/13 1622 03/01/13 2158 03/02/13 0045 03/02/13 0558  GLUCAP 299*  324* 276* 217* 198*   Signed:  Alexes Menchaca  [facility] 03/02/2013, 10:54 AM

## 2013-03-03 LAB — CBC
Hemoglobin: 9.8 g/dL — ABNORMAL LOW (ref 13.0–17.0)
MCH: 27 pg (ref 26.0–34.0)
MCHC: 32.8 g/dL (ref 30.0–36.0)
Platelets: 166 10*3/uL (ref 150–400)

## 2013-03-03 LAB — BASIC METABOLIC PANEL
BUN: 36 mg/dL — ABNORMAL HIGH (ref 6–23)
Calcium: 8.3 mg/dL — ABNORMAL LOW (ref 8.4–10.5)
Creatinine, Ser: 1.24 mg/dL (ref 0.50–1.35)
GFR calc non Af Amer: 50 mL/min — ABNORMAL LOW (ref >90)
Glucose, Bld: 108 mg/dL — ABNORMAL HIGH (ref 70–99)

## 2013-03-03 LAB — PROTIME-INR: INR: 3.54 — ABNORMAL HIGH (ref 0.00–1.49)

## 2013-03-03 NOTE — Progress Notes (Signed)
Pt seen @10 :30 am: 6 Days Post-Op L Hip Bipolar Arthroplasty, ORIF GT fx, reports moderate px with movement well controlled on px meds, pt had BM this AM and is feeling better  There were no vitals taken for this visit.   L hip dressing C/D/I  Knee immobilizer in place   Hg 9.8, INR 3.54  Assessment and Plan:  -6 days s/p left hip hemiarthroplasty, ORIF GT fracture  -NWB (no prosthetic use on BKA for 6wks)  -OOB to chair when able  -Due to complex nature of anticoagulation and patient's multiple comorbidites, will defer anticoagulation mgmt to primary team, currently INR is high at 3.54 but improved from 5.10 yesterday -Pain is well controlled, continue current pain mgmt  -VTE proph: per primary team  - patient constipation resolved, continue regular bowel regimen  -Daughter not present on exam today, pt asked for additional PT and instructed this will be continued

## 2013-03-04 ENCOUNTER — Other Ambulatory Visit (HOSPITAL_COMMUNITY): Payer: Medicare Other

## 2013-03-04 LAB — CBC
MCV: 82.1 fL (ref 78.0–100.0)
Platelets: 190 10*3/uL (ref 150–400)
RBC: 3.75 MIL/uL — ABNORMAL LOW (ref 4.22–5.81)
WBC: 12.5 10*3/uL — ABNORMAL HIGH (ref 4.0–10.5)

## 2013-03-04 LAB — PREALBUMIN: Prealbumin: 5.1 mg/dL — ABNORMAL LOW (ref 17.0–34.0)

## 2013-03-05 ENCOUNTER — Other Ambulatory Visit (HOSPITAL_COMMUNITY): Payer: Medicare Other

## 2013-03-05 LAB — CBC
Hemoglobin: 10.4 g/dL — ABNORMAL LOW (ref 13.0–17.0)
MCH: 27.3 pg (ref 26.0–34.0)
RBC: 3.81 MIL/uL — ABNORMAL LOW (ref 4.22–5.81)

## 2013-03-05 LAB — BASIC METABOLIC PANEL
CO2: 26 mEq/L (ref 19–32)
Calcium: 7.9 mg/dL — ABNORMAL LOW (ref 8.4–10.5)
GFR calc Af Amer: 68 mL/min — ABNORMAL LOW (ref >90)
GFR calc non Af Amer: 58 mL/min — ABNORMAL LOW (ref >90)
Glucose, Bld: 187 mg/dL — ABNORMAL HIGH (ref 70–99)
Sodium: 135 mEq/L (ref 135–145)

## 2013-03-05 LAB — PROTIME-INR: Prothrombin Time: 21.2 seconds — ABNORMAL HIGH (ref 11.6–15.2)

## 2013-03-06 LAB — CBC
HCT: 32.9 % — ABNORMAL LOW (ref 39.0–52.0)
Hemoglobin: 10.9 g/dL — ABNORMAL LOW (ref 13.0–17.0)
MCHC: 33.1 g/dL (ref 30.0–36.0)
RBC: 3.99 MIL/uL — ABNORMAL LOW (ref 4.22–5.81)
WBC: 16.1 10*3/uL — ABNORMAL HIGH (ref 4.0–10.5)

## 2013-03-06 LAB — COMPREHENSIVE METABOLIC PANEL
BUN: 29 mg/dL — ABNORMAL HIGH (ref 6–23)
CO2: 27 mEq/L (ref 19–32)
Calcium: 8.4 mg/dL (ref 8.4–10.5)
Creatinine, Ser: 1.27 mg/dL (ref 0.50–1.35)
GFR calc Af Amer: 57 mL/min — ABNORMAL LOW (ref >90)
GFR calc non Af Amer: 49 mL/min — ABNORMAL LOW (ref >90)
Glucose, Bld: 84 mg/dL (ref 70–99)
Total Protein: 6.1 g/dL (ref 6.0–8.3)

## 2013-03-06 LAB — PROTIME-INR
INR: 1.81 — ABNORMAL HIGH (ref 0.00–1.49)
Prothrombin Time: 20.3 seconds — ABNORMAL HIGH (ref 11.6–15.2)

## 2013-03-07 LAB — CBC WITH DIFFERENTIAL/PLATELET
Basophils Absolute: 0.1 10*3/uL (ref 0.0–0.1)
Eosinophils Absolute: 0.9 10*3/uL — ABNORMAL HIGH (ref 0.0–0.7)
HCT: 31.3 % — ABNORMAL LOW (ref 39.0–52.0)
Lymphocytes Relative: 10 % — ABNORMAL LOW (ref 12–46)
MCHC: 32.6 g/dL (ref 30.0–36.0)
Neutro Abs: 10.6 10*3/uL — ABNORMAL HIGH (ref 1.7–7.7)
Platelets: 342 10*3/uL (ref 150–400)
RDW: 21.3 % — ABNORMAL HIGH (ref 11.5–15.5)
WBC: 14.4 10*3/uL — ABNORMAL HIGH (ref 4.0–10.5)

## 2013-03-07 LAB — BASIC METABOLIC PANEL
BUN: 28 mg/dL — ABNORMAL HIGH (ref 6–23)
Creatinine, Ser: 1.27 mg/dL (ref 0.50–1.35)
GFR calc Af Amer: 57 mL/min — ABNORMAL LOW (ref >90)
GFR calc non Af Amer: 49 mL/min — ABNORMAL LOW (ref >90)
Potassium: 4.4 mEq/L (ref 3.5–5.1)

## 2013-03-07 LAB — PROTIME-INR
INR: 1.68 — ABNORMAL HIGH (ref 0.00–1.49)
Prothrombin Time: 19.2 seconds — ABNORMAL HIGH (ref 11.6–15.2)

## 2013-03-07 LAB — PROCALCITONIN: Procalcitonin: <0.1 ng/mL

## 2013-03-08 DIAGNOSIS — M7989 Other specified soft tissue disorders: Secondary | ICD-10-CM

## 2013-03-08 LAB — CBC WITH DIFFERENTIAL/PLATELET
Basophils Absolute: 0.1 10*3/uL (ref 0.0–0.1)
Lymphs Abs: 1.6 10*3/uL (ref 0.7–4.0)
MCH: 28.1 pg (ref 26.0–34.0)
MCV: 83.5 fL (ref 78.0–100.0)
Monocytes Absolute: 1.6 10*3/uL — ABNORMAL HIGH (ref 0.1–1.0)
Platelets: 389 10*3/uL (ref 150–400)
RDW: 21.7 % — ABNORMAL HIGH (ref 11.5–15.5)
WBC: 14.2 10*3/uL — ABNORMAL HIGH (ref 4.0–10.5)

## 2013-03-08 LAB — BASIC METABOLIC PANEL
BUN: 28 mg/dL — ABNORMAL HIGH (ref 6–23)
Calcium: 8.1 mg/dL — ABNORMAL LOW (ref 8.4–10.5)
Creatinine, Ser: 1.25 mg/dL (ref 0.50–1.35)
GFR calc Af Amer: 58 mL/min — ABNORMAL LOW (ref >90)
GFR calc non Af Amer: 50 mL/min — ABNORMAL LOW (ref >90)

## 2013-03-08 LAB — PROTIME-INR: Prothrombin Time: 18.9 seconds — ABNORMAL HIGH (ref 11.6–15.2)

## 2013-03-08 NOTE — Progress Notes (Signed)
VASCULAR LAB PRELIMINARY  PRELIMINARY  PRELIMINARY  PRELIMINARY  Right upper extremity venous duplex completed.    Preliminary report:  Right:  No evidence of DVT or superficial thrombosis.    Nalee Lightle, RVT 03/08/2013, 12:47 PM

## 2013-03-09 ENCOUNTER — Other Ambulatory Visit (HOSPITAL_COMMUNITY): Payer: Medicare Other

## 2013-03-09 LAB — URINE MICROSCOPIC-ADD ON

## 2013-03-09 LAB — CBC WITH DIFFERENTIAL/PLATELET
Eosinophils Absolute: 0.4 10*3/uL (ref 0.0–0.7)
Eosinophils Relative: 3 % (ref 0–5)
Lymphs Abs: 1.7 10*3/uL (ref 0.7–4.0)
MCH: 28.2 pg (ref 26.0–34.0)
MCV: 85.8 fL (ref 78.0–100.0)
Platelets: 429 10*3/uL — ABNORMAL HIGH (ref 150–400)
RBC: 3.37 MIL/uL — ABNORMAL LOW (ref 4.22–5.81)
RDW: 21.9 % — ABNORMAL HIGH (ref 11.5–15.5)

## 2013-03-09 LAB — URINALYSIS, ROUTINE W REFLEX MICROSCOPIC
Glucose, UA: NEGATIVE mg/dL
Ketones, ur: NEGATIVE mg/dL
Specific Gravity, Urine: 1.018 (ref 1.005–1.030)
pH: 5 (ref 5.0–8.0)

## 2013-03-09 LAB — HEPATIC FUNCTION PANEL
Alkaline Phosphatase: 112 U/L (ref 39–117)
Indirect Bilirubin: 0.6 mg/dL (ref 0.3–0.9)
Total Protein: 5.8 g/dL — ABNORMAL LOW (ref 6.0–8.3)

## 2013-03-09 LAB — VANCOMYCIN, TROUGH: Vancomycin Tr: 15.8 ug/mL (ref 10.0–20.0)

## 2013-03-10 ENCOUNTER — Other Ambulatory Visit (HOSPITAL_COMMUNITY): Payer: Medicare Other

## 2013-03-10 LAB — CBC WITH DIFFERENTIAL/PLATELET
Basophils Relative: 1 % (ref 0–1)
Eosinophils Absolute: 0.4 10*3/uL (ref 0.0–0.7)
HCT: 27.6 % — ABNORMAL LOW (ref 39.0–52.0)
Hemoglobin: 8.9 g/dL — ABNORMAL LOW (ref 13.0–17.0)
Lymphocytes Relative: 11 % — ABNORMAL LOW (ref 12–46)
Lymphs Abs: 1.6 10*3/uL (ref 0.7–4.0)
MCH: 27.9 pg (ref 26.0–34.0)
MCHC: 32.2 g/dL (ref 30.0–36.0)
MCV: 86.5 fL (ref 78.0–100.0)
Monocytes Absolute: 1.6 10*3/uL — ABNORMAL HIGH (ref 0.1–1.0)
Neutro Abs: 11.2 10*3/uL — ABNORMAL HIGH (ref 1.7–7.7)

## 2013-03-10 LAB — BASIC METABOLIC PANEL
BUN: 26 mg/dL — ABNORMAL HIGH (ref 6–23)
CO2: 31 mEq/L (ref 19–32)
Chloride: 103 mEq/L (ref 96–112)
Creatinine, Ser: 1.23 mg/dL (ref 0.50–1.35)
Glucose, Bld: 76 mg/dL (ref 70–99)
Potassium: 4.3 mEq/L (ref 3.5–5.1)

## 2013-03-10 LAB — C-REACTIVE PROTEIN: CRP: 12.3 mg/dL — ABNORMAL HIGH (ref <0.60)

## 2013-03-11 LAB — CBC WITH DIFFERENTIAL/PLATELET
Basophils Absolute: 0 10*3/uL (ref 0.0–0.1)
Lymphs Abs: 0.8 10*3/uL (ref 0.7–4.0)
MCH: 28 pg (ref 26.0–34.0)
MCV: 85.8 fL (ref 78.0–100.0)
Monocytes Absolute: 1.2 10*3/uL — ABNORMAL HIGH (ref 0.1–1.0)
Monocytes Relative: 9 % (ref 3–12)
Platelets: 447 10*3/uL — ABNORMAL HIGH (ref 150–400)
RDW: 22.3 % — ABNORMAL HIGH (ref 11.5–15.5)
WBC: 13.5 10*3/uL — ABNORMAL HIGH (ref 4.0–10.5)

## 2013-03-11 LAB — BASIC METABOLIC PANEL
Calcium: 7.9 mg/dL — ABNORMAL LOW (ref 8.4–10.5)
Creatinine, Ser: 1.25 mg/dL (ref 0.50–1.35)
GFR calc Af Amer: 58 mL/min — ABNORMAL LOW (ref >90)
GFR calc non Af Amer: 50 mL/min — ABNORMAL LOW (ref >90)

## 2013-03-11 LAB — PROCALCITONIN: Procalcitonin: <0.1 ng/mL

## 2013-03-11 LAB — URINE CULTURE

## 2013-03-12 LAB — CBC WITH DIFFERENTIAL/PLATELET
Basophils Absolute: 0.1 10*3/uL (ref 0.0–0.1)
Eosinophils Absolute: 0.5 10*3/uL (ref 0.0–0.7)
HCT: 24.8 % — ABNORMAL LOW (ref 39.0–52.0)
Lymphs Abs: 1.3 10*3/uL (ref 0.7–4.0)
MCHC: 32.3 g/dL (ref 30.0–36.0)
MCV: 87 fL (ref 78.0–100.0)
Monocytes Relative: 11 % (ref 3–12)
Neutro Abs: 10 10*3/uL — ABNORMAL HIGH (ref 1.7–7.7)
RDW: 22.3 % — ABNORMAL HIGH (ref 11.5–15.5)

## 2013-03-12 LAB — BASIC METABOLIC PANEL
BUN: 30 mg/dL — ABNORMAL HIGH (ref 6–23)
Chloride: 101 mEq/L (ref 96–112)
Creatinine, Ser: 1.36 mg/dL — ABNORMAL HIGH (ref 0.50–1.35)
GFR calc Af Amer: 52 mL/min — ABNORMAL LOW (ref >90)
Glucose, Bld: 238 mg/dL — ABNORMAL HIGH (ref 70–99)

## 2013-03-13 LAB — BASIC METABOLIC PANEL
BUN: 37 mg/dL — ABNORMAL HIGH (ref 6–23)
CO2: 30 mEq/L (ref 19–32)
Calcium: 8.1 mg/dL — ABNORMAL LOW (ref 8.4–10.5)
Creatinine, Ser: 1.51 mg/dL — ABNORMAL HIGH (ref 0.50–1.35)
Glucose, Bld: 288 mg/dL — ABNORMAL HIGH (ref 70–99)

## 2013-03-13 LAB — CBC
HCT: 24.3 % — ABNORMAL LOW (ref 39.0–52.0)
MCH: 28.5 pg (ref 26.0–34.0)
MCV: 88.7 fL (ref 78.0–100.0)
Platelets: 490 10*3/uL — ABNORMAL HIGH (ref 150–400)
RBC: 2.74 MIL/uL — ABNORMAL LOW (ref 4.22–5.81)

## 2013-03-14 ENCOUNTER — Other Ambulatory Visit (HOSPITAL_COMMUNITY): Payer: Medicare Other

## 2013-03-14 LAB — CBC WITH DIFFERENTIAL/PLATELET
Eosinophils Relative: 6 % — ABNORMAL HIGH (ref 0–5)
HCT: 23.9 % — ABNORMAL LOW (ref 39.0–52.0)
Lymphs Abs: 1.2 10*3/uL (ref 0.7–4.0)
MCV: 87.5 fL (ref 78.0–100.0)
Monocytes Relative: 9 % (ref 3–12)
Neutro Abs: 9.2 10*3/uL — ABNORMAL HIGH (ref 1.7–7.7)
RBC: 2.73 MIL/uL — ABNORMAL LOW (ref 4.22–5.81)
WBC: 12.3 10*3/uL — ABNORMAL HIGH (ref 4.0–10.5)

## 2013-03-14 LAB — BASIC METABOLIC PANEL
BUN: 37 mg/dL — ABNORMAL HIGH (ref 6–23)
CO2: 31 mEq/L (ref 19–32)
Chloride: 100 mEq/L (ref 96–112)
Creatinine, Ser: 1.51 mg/dL — ABNORMAL HIGH (ref 0.50–1.35)

## 2013-03-14 LAB — CLOSTRIDIUM DIFFICILE BY PCR: Toxigenic C. Difficile by PCR: POSITIVE — AB

## 2013-03-15 ENCOUNTER — Other Ambulatory Visit (HOSPITAL_COMMUNITY): Payer: Medicare Other

## 2013-03-15 LAB — BASIC METABOLIC PANEL
CO2: 30 mEq/L (ref 19–32)
GFR calc non Af Amer: 39 mL/min — ABNORMAL LOW (ref >90)
Glucose, Bld: 97 mg/dL (ref 70–99)
Potassium: 4.6 mEq/L (ref 3.5–5.1)
Sodium: 137 mEq/L (ref 135–145)

## 2013-03-15 LAB — CBC
Hemoglobin: 7.8 g/dL — ABNORMAL LOW (ref 13.0–17.0)
MCH: 28.8 pg (ref 26.0–34.0)
MCV: 90.4 fL (ref 78.0–100.0)
RBC: 2.71 MIL/uL — ABNORMAL LOW (ref 4.22–5.81)

## 2013-03-16 ENCOUNTER — Institutional Professional Consult (permissible substitution) (HOSPITAL_COMMUNITY): Payer: Medicare Other

## 2013-03-16 LAB — BASIC METABOLIC PANEL
BUN: 37 mg/dL — ABNORMAL HIGH (ref 6–23)
Chloride: 103 mEq/L (ref 96–112)
GFR calc Af Amer: 41 mL/min — ABNORMAL LOW (ref >90)
Potassium: 4.2 mEq/L (ref 3.5–5.1)
Sodium: 140 mEq/L (ref 135–145)

## 2013-03-16 LAB — HEMOGLOBIN A1C
Hgb A1c MFr Bld: 6.7 % — ABNORMAL HIGH (ref <5.7)
Mean Plasma Glucose: 146 mg/dL — ABNORMAL HIGH (ref <117)

## 2013-03-16 LAB — HEMOGLOBIN: Hemoglobin: 7.5 g/dL — ABNORMAL LOW (ref 13.0–17.0)

## 2013-03-16 LAB — PROTIME-INR: Prothrombin Time: 16.9 seconds — ABNORMAL HIGH (ref 11.6–15.2)

## 2013-03-17 LAB — CULTURE, BLOOD (ROUTINE X 2): Culture: NO GROWTH

## 2013-03-17 LAB — CBC WITH DIFFERENTIAL/PLATELET
Basophils Absolute: 0.1 10*3/uL (ref 0.0–0.1)
Eosinophils Relative: 7 % — ABNORMAL HIGH (ref 0–5)
HCT: 24.5 % — ABNORMAL LOW (ref 39.0–52.0)
Hemoglobin: 7.6 g/dL — ABNORMAL LOW (ref 13.0–17.0)
Lymphocytes Relative: 11 % — ABNORMAL LOW (ref 12–46)
Lymphs Abs: 0.9 10*3/uL (ref 0.7–4.0)
MCV: 90.1 fL (ref 78.0–100.0)
Monocytes Absolute: 0.9 10*3/uL (ref 0.1–1.0)
Monocytes Relative: 11 % (ref 3–12)
Neutro Abs: 5.8 10*3/uL (ref 1.7–7.7)
RBC: 2.72 MIL/uL — ABNORMAL LOW (ref 4.22–5.81)
RDW: 21.7 % — ABNORMAL HIGH (ref 11.5–15.5)
WBC: 9 10*3/uL (ref 4.0–10.5)

## 2013-03-17 LAB — BASIC METABOLIC PANEL
CO2: 28 mEq/L (ref 19–32)
Chloride: 98 mEq/L (ref 96–112)
GFR calc Af Amer: 43 mL/min — ABNORMAL LOW (ref >90)
Potassium: 4.3 mEq/L (ref 3.5–5.1)

## 2013-03-18 ENCOUNTER — Other Ambulatory Visit (HOSPITAL_COMMUNITY): Payer: Medicare Other

## 2013-03-18 LAB — CBC WITH DIFFERENTIAL/PLATELET
Basophils Relative: 1 % (ref 0–1)
Eosinophils Relative: 7 % — ABNORMAL HIGH (ref 0–5)
HCT: 24.6 % — ABNORMAL LOW (ref 39.0–52.0)
Hemoglobin: 7.7 g/dL — ABNORMAL LOW (ref 13.0–17.0)
Lymphocytes Relative: 7 % — ABNORMAL LOW (ref 12–46)
MCH: 27.9 pg (ref 26.0–34.0)
Monocytes Absolute: 1.3 10*3/uL — ABNORMAL HIGH (ref 0.1–1.0)
Neutro Abs: 8 10*3/uL — ABNORMAL HIGH (ref 1.7–7.7)
Neutrophils Relative %: 73 % (ref 43–77)
RBC: 2.76 MIL/uL — ABNORMAL LOW (ref 4.22–5.81)

## 2013-03-18 LAB — BLOOD GAS, ARTERIAL
FIO2: 0.3 %
pCO2 arterial: 45.3 mmHg — ABNORMAL HIGH (ref 35.0–45.0)
pO2, Arterial: 55.4 mmHg — ABNORMAL LOW (ref 80.0–100.0)

## 2013-03-18 LAB — BASIC METABOLIC PANEL
Chloride: 99 mEq/L (ref 96–112)
GFR calc non Af Amer: 39 mL/min — ABNORMAL LOW (ref >90)
Glucose, Bld: 250 mg/dL — ABNORMAL HIGH (ref 70–99)
Potassium: 4.7 mEq/L (ref 3.5–5.1)
Sodium: 135 mEq/L (ref 135–145)

## 2013-03-19 ENCOUNTER — Other Ambulatory Visit (HOSPITAL_COMMUNITY): Payer: Medicare Other

## 2013-03-19 LAB — URINALYSIS, ROUTINE W REFLEX MICROSCOPIC
Protein, ur: NEGATIVE mg/dL
Urobilinogen, UA: 0.2 mg/dL (ref 0.0–1.0)

## 2013-03-19 LAB — CBC
Hemoglobin: 8.3 g/dL — ABNORMAL LOW (ref 13.0–17.0)
RBC: 3.01 MIL/uL — ABNORMAL LOW (ref 4.22–5.81)

## 2013-03-19 LAB — HEPATIC FUNCTION PANEL
AST: 14 U/L (ref 0–37)
Albumin: 2.4 g/dL — ABNORMAL LOW (ref 3.5–5.2)
Total Bilirubin: 0.9 mg/dL (ref 0.3–1.2)

## 2013-03-19 LAB — PROTIME-INR
INR: 1.66 — ABNORMAL HIGH (ref 0.00–1.49)
Prothrombin Time: 19.1 seconds — ABNORMAL HIGH (ref 11.6–15.2)

## 2013-03-19 LAB — URINE MICROSCOPIC-ADD ON

## 2013-03-20 ENCOUNTER — Encounter: Payer: Self-pay | Admitting: Radiology

## 2013-03-20 ENCOUNTER — Other Ambulatory Visit (HOSPITAL_COMMUNITY): Payer: Medicare Other

## 2013-03-20 LAB — CBC
Hemoglobin: 7.8 g/dL — ABNORMAL LOW (ref 13.0–17.0)
MCHC: 30.7 g/dL (ref 30.0–36.0)
Platelets: 455 10*3/uL — ABNORMAL HIGH (ref 150–400)
RDW: 22.2 % — ABNORMAL HIGH (ref 11.5–15.5)

## 2013-03-20 LAB — BASIC METABOLIC PANEL
GFR calc Af Amer: 31 mL/min — ABNORMAL LOW (ref >90)
GFR calc non Af Amer: 27 mL/min — ABNORMAL LOW (ref >90)
Potassium: 4.7 mEq/L (ref 3.5–5.1)
Sodium: 138 mEq/L (ref 135–145)

## 2013-03-20 LAB — URINE CULTURE

## 2013-03-20 LAB — PROTIME-INR
INR: 1.83 — ABNORMAL HIGH (ref 0.00–1.49)
Prothrombin Time: 20.5 seconds — ABNORMAL HIGH (ref 11.6–15.2)

## 2013-03-20 LAB — MAGNESIUM: Magnesium: 2.5 mg/dL (ref 1.5–2.5)

## 2013-03-21 LAB — CBC WITH DIFFERENTIAL/PLATELET
Basophils Absolute: 0.2 10*3/uL — ABNORMAL HIGH (ref 0.0–0.1)
Eosinophils Absolute: 0.4 10*3/uL (ref 0.0–0.7)
Lymphocytes Relative: 5 % — ABNORMAL LOW (ref 12–46)
MCH: 28.4 pg (ref 26.0–34.0)
MCHC: 31.4 g/dL (ref 30.0–36.0)
Monocytes Absolute: 1.7 10*3/uL — ABNORMAL HIGH (ref 0.1–1.0)
Neutrophils Relative %: 84 % — ABNORMAL HIGH (ref 43–77)
RDW: 22.4 % — ABNORMAL HIGH (ref 11.5–15.5)

## 2013-03-21 LAB — BASIC METABOLIC PANEL
BUN: 72 mg/dL — ABNORMAL HIGH (ref 6–23)
Calcium: 8.1 mg/dL — ABNORMAL LOW (ref 8.4–10.5)
GFR calc Af Amer: 25 mL/min — ABNORMAL LOW (ref >90)
GFR calc non Af Amer: 22 mL/min — ABNORMAL LOW (ref >90)
Potassium: 4.7 mEq/L (ref 3.5–5.1)
Sodium: 137 mEq/L (ref 135–145)

## 2013-03-21 LAB — PROTIME-INR
INR: 1.65 — ABNORMAL HIGH (ref 0.00–1.49)
Prothrombin Time: 19 seconds — ABNORMAL HIGH (ref 11.6–15.2)

## 2013-03-22 ENCOUNTER — Other Ambulatory Visit (HOSPITAL_COMMUNITY): Payer: Medicare Other

## 2013-03-22 LAB — CBC WITH DIFFERENTIAL/PLATELET
Basophils Absolute: 0.2 10*3/uL — ABNORMAL HIGH (ref 0.0–0.1)
Eosinophils Absolute: 0.5 10*3/uL (ref 0.0–0.7)
Lymphs Abs: 1 10*3/uL (ref 0.7–4.0)
MCH: 28.1 pg (ref 26.0–34.0)
MCHC: 31.2 g/dL (ref 30.0–36.0)
MCV: 90 fL (ref 78.0–100.0)
Monocytes Absolute: 1.7 10*3/uL — ABNORMAL HIGH (ref 0.1–1.0)
Monocytes Relative: 10 % (ref 3–12)
Platelets: 398 10*3/uL (ref 150–400)
RDW: 22.2 % — ABNORMAL HIGH (ref 11.5–15.5)
WBC: 17.4 10*3/uL — ABNORMAL HIGH (ref 4.0–10.5)

## 2013-03-22 LAB — BASIC METABOLIC PANEL
BUN: 87 mg/dL — ABNORMAL HIGH (ref 6–23)
CO2: 31 mEq/L (ref 19–32)
Calcium: 8.1 mg/dL — ABNORMAL LOW (ref 8.4–10.5)
Creatinine, Ser: 3.18 mg/dL — ABNORMAL HIGH (ref 0.50–1.35)

## 2013-03-22 LAB — PROTIME-INR: INR: 1.58 — ABNORMAL HIGH (ref 0.00–1.49)

## 2013-03-25 LAB — CULTURE, BLOOD (ROUTINE X 2): Culture: NO GROWTH

## 2013-04-18 DEATH — deceased

## 2015-04-07 NOTE — Discharge Summary (Signed)
PATIENT NAME:  Nancy FetterGILLIAM, Santino K MR#:  [id number] DATE OF BIRTH:  09-06-1924  DATE OF ADMISSION:  11/06/2012 DATE OF DISCHARGE:  11/08/2012  DISCHARGE DIAGNOSES:  1. Congestive heart failure, secondary to coronary ischemia.  2. Chronic atrial fibrillation.  3. Diabetes mellitus, insulin requiring, brittle.  4. Severe AS postoperative.  5. Coronary artery disease.  6. Severe peripheral vascular disease post right below-knee amputation.  7. Anxiety/depression.   DISCHARGE MEDICATIONS:  1. Aspirin 81 mg daily.  2. Metoprolol tartrate 50 mg, 1-1/2 tabs b.i.d.  3. Vitamin D3 1000 units daily.  4. Lantus insulin 30 units a.m.  5. NovoLog insulin 5 units subcutaneous. 6. MiraLax daily.  7. Cymbalta 30 mg daily.  8. Simvastatin 40 mg at bedtime.  9. Coumadin 3 mg at bedtime.  10. Amlodipine 5 mg daily.  11. Torsemide 20 mg b.i.d.  12. Potassium chloride 10 mEq b.i.d.  13. 2 liters O2 nasal cannula.   REASON FOR ADMISSION: An 81 year old male presents with congestive heart failure. Please see History and Physical for history of present illness, past medical history, and physical exam.   HOSPITAL COURSE: The patient was admitted, diuresed aggressively. His chest x-ray showed pulmonary edema. His troponins bumped to 1.7, with normal CKs, and without there being a clear exacerbation factor for his congestive heart failure. Ischemia is the most likely etiology. He has known severe vascular disease and is a diabetic and likely has silent ischemia. The family was interested  in starting nitrates for further antianginal medication and drop in blood pressure further. His ejection fraction was 40%, and has been intolerant to ACE inhibitors in the past. He will be going home on the higher dose of torsemide and potassium. His pulse oximetry of 85% on room air, and on discharge he qualifies for 2 liters O2 nasal cannula. Overall prognosis is guarded. He is going to be fitted for his stump tomorrow post  below-knee amputation.   ____________________________ Danella PentonMark F. Levana Minetti, MD mfm:cbb D: 11/08/2012 17:35:14 ET T: 11/09/2012 12:14:30 ET JOB#: [id number]  cc: Danella PentonMark F. Eurydice Calixto, MD, <Dictator> Verneice Caspers Sherlene ShamsF Kolin Erdahl MD ELECTRONICALLY SIGNED 11/12/2012 8:16

## 2015-04-07 NOTE — Consult Note (Signed)
PATIENT NAME:  Aaron [name], Aaron [name] MR#:  [id number] DATE OF BIRTH:  November 29, 1924  DATE OF CONSULTATION:  07/27/2012  REFERRING PHYSICIAN:   CONSULTING PHYSICIAN:  Marcina Millard, MD  PRIMARY CARE PHYSICIAN: Dr. Bethann Punches.   CHIEF COMPLAINT: I have a sore on my left foot.   REASON FOR CONSULTATION: Consultation requested for evaluation of elevated troponin in the setting of gangrene.   HISTORY OF PRESENT ILLNESS: The patient is an 81 year old gentleman with known history of coronary artery disease status post bypass graft surgery, diabetes mellitus, and chronic kidney disease. The patient has a 1 to 2 week history of nonhealing ulcer on his left foot. The patient underwent arteriogram PTA and stenting of the left popliteal and left anterior tibial artery without success. 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 The patient denies chest pain though he has had some nausea. Labs from today were remarkable for elevated troponin of 3.0, total CK and MB 155 and 10.9, respectively. EKG was nondiagnostic. The patient has remained in sinus rhythm.   PAST MEDICAL HISTORY:  1. Status post coronary artery bypass graft surgery.  2. Status post aortic valve replacement with bioprosthetic valve. 3. Insulin requiring diabetes.  4. Hypertension.  5. Chronic kidney disease stage III. 6. Atrial fibrillation  7. Peripheral vascular disease status post prior left iliac stent.   MEDICATIONS ON ADMISSION:  1. Aspirin 81 mg daily.  2. Torsemide 20 mg daily.  3. Lopressor 50 mg b.i.d.  4. Potassium 10 mEq daily.  5. Amlodipine 5 mg daily.  6. Coumadin 2.5 mg alternating with 3.75 mg daily.  7. Simvastatin 40 mg daily.  8. Lantus insulin 20 units subcutaneous daily.  9. Cymbalta 30 mg daily.  10. Calcium 500 mg b.i.d.   SOCIAL HISTORY: The patient currently lives alone. He denies tobacco abuse.   FAMILY HISTORY: No immediate family history of coronary artery disease  or myocardial infarction.   REVIEW OF SYSTEMS: CONSTITUTIONAL: The patient currently denies fever or chills. EYES: No blurry vision. EARS: No hearing loss. RESPIRATORY: No shortness of breath. CARDIOVASCULAR: No chest pain. GASTROINTESTINAL: The patient had nausea with vomiting last evening. GU: No dysuria or hematuria. MUSCULOSKELETAL: No arthralgias or myalgias. NEUROLOGICAL: No focal muscle weakness or numbness. PSYCHOLOGICAL: No depression or anxiety.   PHYSICAL EXAMINATION:  VITAL SIGNS: Blood pressure 128/71, pulse 82, respirations 18, temperature 97.8, pulse oximetry 95%.   HEENT: Pupils equal, reactive to light and accommodation.   NECK: Supple without thyromegaly.   LUNGS: Clear.   HEART: Normal jugular venous pressure. Normal point of maximal impulse. Irregular, irregular rhythm. Normal S1, S2. No appreciable gallop, murmur, or rub.   ABDOMEN: Soft and nontender. Pulses were diminished bilaterally There was a significant area of ischemia in the left great toe with cyanosis involving the entire foot and above the ankle.    IMPRESSION: This is an 81 year old gentleman with known coronary artery disease who presents with gangrene of his left great toe with unsuccessful angioplasty and stenting. The patient had an episode of nausea with vomiting without chest pain. The patient does have elevated troponin of 3.0 in the absence of chest pain or ECG changes. It is unclear whether elevated troponin is due to demand supply ischemia in the setting of gangrene or due to acute coronary syndrome since the patient has known coronary artery disease status post bypass graft surgery. However, despite increased risk for cardiovascular complication, I would proceed with amputation of left lower extremity without  further cardiac diagnostics.   RECOMMENDATIONS:  1. Continue present medications.  2. Would defer full dose anticoagulation in the absence of chest pain.  3. Continue metoprolol tartrate pre-,  peri- and postoperatively.  4. Proceed with left lower extremity amputation without further delay.   ____________________________ Marcina MillardAlexander Indalecio Malmstrom, MD ap:ap D: 07/27/2012 17:35:54 ET T: 07/28/2012 11:13:33 ET JOB#: [id number]  cc: Marcina MillardAlexander Isaura Schiller, MD, <Dictator> Marcina MillardALEXANDER Jaiyon Wander MD ELECTRONICALLY SIGNED 07/29/2012 10:55

## 2015-04-07 NOTE — Consult Note (Signed)
PATIENT NAME:  Aaron [name], [name] MR#:  [id number] DATE OF BIRTH:  September 12, 1924  DATE OF CONSULTATION:  07/27/2012  REFERRING PHYSICIAN:   CONSULTING PHYSICIAN:  Ezzard StandingPaul Y. Bluford Kaufmannh, MD  REASON FOR REFERRAL: Nausea.   HISTORY OF PRESENT ILLNESS: The patient is an 81 year old white male with a known history of coronary artery disease and longstanding diabetes over 40 years associated with chronic renal disease and diabetic neuropathy, who also has peripheral vascular disease. The patient has had prior left iliac stenting. The patient was admitted because of ischemic left toes. Initially he was supratherapeutic on his Coumadin at 5.3. He finally underwent an arteriogram yesterday once the Coumadin level came down a little bit.   I was asked to see the patient because of nausea that started in the hospital. I tried to see the patient yesterday but he was unavailable because of the arteriogram. According to the patient and the daughter, nausea started while he was in the hospital. He has been complaining of rather severe left foot pain for which he was receiving IV morphine prior to the admission. Prior to admission the patient did not have any issues with nausea or vomiting. He denies any history of heartburn, indigestion, or abdominal pain. There is no history of ulcer disease.   On the angiography yesterday the patient apparently had several episodes of vomiting according to the daughter. This morning the nausea is gone. The patient feels fine. We actually gave him some clear liquid diet which he was able to tolerate.   Despite the stenting the patient's toe still looks necrotic looking, particularly the left toe. BKA was discussed with the patient but so far the patient refuses any surgery.   PAST MEDICAL HISTORY:  1. Coronary artery disease with bypass surgery. 2. Prosthetic valve, aortic valve placed.  3. Hypertension.  4. Diabetes.  5. Diabetic neuropathy. 6. Atrial fibrillation. 7. Peripheral vascular  disease.   ALLERGIES: He is allergic to penicillin and metformin.   MEDICATIONS AT HOME:  1. Baby aspirin. 2. Insulin. 3. Torsemide.  4. Potassium. 5. Lopressor. 6. Amlodipine. 7. Coumadin. 8. Zocor. 9. Cymbalta. 10. Calcium.   SOCIAL HISTORY: Does not drink or smoke.  FAMILY HISTORY:  Hypertension, diabetes, and aneurysm.   REVIEW OF SYSTEMS: There are no fevers or chills. There is no weakness. There is no chest pain or palpitations. There is no coughing or shortness of breath. GI symptoms are negative. Nausea and vomiting only in the hospital but no hematemesis or gross hematochezia or melena. There is no abdominal pain.   PHYSICAL EXAMINATION:   GENERAL: The patient appears to be in no acute distress.  VITAL SIGNS: Afebrile. Temperature 98.1, pulse 83, respirations 18, blood pressure 144/74, pulse 91%.   HEENT: Normocephalic, atraumatic head. Pupils are equally reactive. Throat was clear.   NECK: Supple.   CARDIAC: Regular rhythm and rate.   LUNGS: Clear bilaterally.   ABDOMEN: Normoactive bowel sounds. Soft and nontender throughout. There is no hepatomegaly. There are no palpable masses.   EXTREMITIES: Some decrease in perfusion particularly to the left leg. Big toe still looks gangrenous. The left foot in general feels a little cooler than normal.   LABORATORY, DIAGNOSTIC, AND RADIOLOGICAL DATA: Sodium 131, potassium 5.0, chloride 97, CO2 24, BUN 46, creatinine 2.35, glucose 258. Liver enzymes are normal. Troponin level is now 1.95. It was 0.05 yesterday. White count is now up to 18.4, hemoglobin 12.6. INR is down to 1.5 today. It was elevated at 2.5 yesterday.  ASSESSMENT AND PLAN: This is a patient with a short bout of nausea and vomiting. Certainly the anesthesia may have contributed to the vomiting last night. Medicine such as IV morphine can certainly contribute to nausea. Flagyl can do that in some patients. The patient has no GI symptoms. It is likely that the  medication he was placed on in the hospital contributed to the nausea. Fortunately, he has no nausea today. Because of his improvement of nausea and sudden rise in troponin level, I would not recommend endoscopy at this time. The patient was able to tolerate a clear liquid diet. We can advance the diet as tolerated. At this point no other GI recommendations other than conservative management to treat nausea. If his condition changes, certainly can contact us. Dr. Mechele Collin will take over for me this coming week while I'm away.   Thank you for the referral.   ____________________________ Ezzard Standing. Bluford Kaufmann, MD pyo:drc D: 07/27/2012 12:01:24 ET T: 07/27/2012 12:22:23 ET JOB#: [id number]  cc: Ezzard Standing. Bluford Kaufmann, MD, <Dictator> Ezzard Standing Rome Echavarria MD ELECTRONICALLY SIGNED 07/27/2012 13:36

## 2015-04-07 NOTE — Consult Note (Signed)
PATIENT NAME:  Aaron [name], Aaron [name] MR#:  [id number] DATE OF BIRTH:  July 08, 1924  DATE OF CONSULTATION:  07/24/2012  REFERRING PHYSICIAN:  Levora DredgeGregory Schnier, MD  CONSULTING PHYSICIAN:  Lynnea FerrierBert J. Klein III, MD  PRIMARY CARE PHYSICIAN: Dr. Bethann PunchesMark Miller   REASON FOR CONSULTATION: Assistance with control of hypertension and diabetes.   HISTORY OF PRESENT ILLNESS: The patient is an 81 year old male with history of coronary artery disease, diabetes mellitus, insulin dependent, poorly controlled, with history of chronic kidney disease and diabetic nephropathy, prior valvular heart disease with repair, history of coronary artery disease and bypass surgery, and history of peripheral vascular disease with prior left iliac stenting. He is admitted to the Vascular Surgery service with ischemic left toes. They had anticipated arteriogram and possible stenting, however, his INR was found to be supratherapeutic at 5.3 and so they are waiting for his INR to become acceptable before proceeding with arteriogram and angioplasty in hopes of salvaging his toes. He is on Coumadin for history of atrial fibrillation. He denies chest pain, shortness of breath. He is very concerned about changes in his medical regimen as he is very diligent about making sure he takes them as directed. He reports his toes have been bothering him for more than a month. His pain is currently adequately controlled.   PAST MEDICAL HISTORY:  1. Coronary artery disease with prior bypass surgery.  2. History of aortic valvular stenosis, status post bioprosthetic aortic valve replacement.  3. Hypertension.  4. Insulin dependent diabetes mellitus, poorly controlled.  5. Diabetic nephropathy with chronic kidney disease, stage III.  6. History of B12 deficiency.  7. Atrial fibrillation.  8. History of hernia repair x3.  9. History of kyphoplasty of T11, T12, L1.  10. History of right acetabular fracture.  11. Peripheral vascular disease with prior left iliac  stenting.   ALLERGIES: Penicillin and metformin.   MEDICATIONS: 1. Aspirin 81 mg p.o. daily.  2. Lantus 20 units sub-Q daily.  3. Torsemide 20 mg p.o. daily.  4. Lopressor 50 mg p.o. b.i.d.  5. Potassium 10 mEq half tablet p.o. daily.  6. Amlodipine 5 mg p.o. daily.  7. Coumadin 2.5 mg alternating with 3.75 mg daily.  8. Simvastatin 40 mg p.o. daily.  9. Cymbalta 30 mg p.o. daily.  10. Calcium 500 mg p.o. b.i.d.   SOCIAL HISTORY: No alcohol or tobacco currently.   FAMILY HISTORY: Hypertension, diabetes, aneurysms.   REVIEW OF SYSTEMS: Please see history of present illness. Denies fevers, chills. No bowel or bladder changes. Remainder of complete review of systems is negative.   PHYSICAL EXAMINATION:   VITAL SIGNS: Temperature 98, pulse 90, blood pressure 163/96, saturation 96% on room air.   GENERAL: Elderly male, very hard of hearing, in no distress.   EYES: Pupils round and reactive to light. Lids and conjunctivae unremarkable.   EARS, NOSE, AND THROAT: External examination unremarkable. Oropharynx is slightly dry without lesions.   NECK: Supple. Trachea midline. No thyromegaly.   CARDIOVASCULAR: Irregularly irregular, distant without gallops or rubs. Carotid and radial pulses 2+. Left dorsalis pedis pulse is not palpable to my exam. Right dorsalis pedis pulse is reduced.   LUNGS: Clear bilaterally without wheeze or retractions.   ABDOMEN: Soft, nondistended. Positive bowel sounds. Mild tenderness to deep palpation in the left lower quadrant without guard, rebound, or mass.   SKIN: Venostasis changes present bilaterally. Left foot with dressing in place. Diffuse solar damage. Diffuse ecchymoses.   LYMPH: No cervical or supraclavicular nodes.  MUSCULOSKELETAL: Venostasis changes as noted above. Areas of ischemia at the tip of the third toe on the right foot. Left foot with bandage in place, recently placed by Vascular Surgery and so exam deferred.   NEUROLOGIC: Hearing  loss, otherwise cranial nerves grossly intact. Light touch subjectively intact according to the patient when examining the right foot. Motor strength appears to be reduced in the left leg but may be secondary to pain, otherwise limited as the patient is bedbound currently.   LABORATORY, DIAGNOSTIC, AND RADIOLOGICAL DATA: Glucose 315, BUN 42, creatinine 1.7. INR 5.3.   IMPRESSION AND PLAN:  1. Left ischemic foot with peripheral vascular disease. Further therapy as per Vascular, watching INR come down. Certainly would be hesitant to add Vitamin [name] unless absolutely necessary as this may increase risk of clotting in this patient who is already tenuous in terms of his limb perfusion.  2. Diabetes mellitus. Discussed with the patient that we will need to reduce the amount of insulin that he takes and has run slightly high here but since he's not able to get out of bed and get to food, this may be necessary for protection. Will target a glucose of less than 180. Restart Lantus at 10 units sub-Q daily for now. Cover with sliding scale insulin. He checks his blood sugar 8 to 10 times at home and informed him that we likely will have no reason to do this since the insulin that we use here, given his kidney function, would likely last longer than that. He expresses understanding. This was discussed with his family members as well.  3. Hypertension. Blood pressure elevated. Increased amlodipine to 10 mg daily. Would stop torsemide and potassium for now given his azotemia and the fact that he is going to need IV dye we need to prevent further kidney damage. Avoid other nephrotoxins.  4. Diabetic nephropathy, again avoiding nephrotoxins on low rate IV fluid but will need to watch for overload.  5. Aortic valvular heart disease, bioprosthetic valve. Antibiotics prior to angiography would be as per Surgery but likely recommended.  6. Atrial fibrillation, rate controlled with Lopressor. Continue current dose.   7. Arthritis. Pain medications were ordered to be used as needed. He will continue on Cymbalta as well.   Above plan of action was discussed with the patient and his family members. Given the increased amlodipine and the contraindication with simvastatin, will change his simvastatin to Lipitor for now with further decisions left to his primary care physician.   ____________________________ Lynnea Ferrier, MD bjk:drc D: 07/24/2012 19:36:00 ET T: 07/25/2012 09:24:33 ET JOB#: [id number]  cc: Curtis Sites III, MD, <Dictator> Danella Penton, MD Daniel Nones MD ELECTRONICALLY SIGNED 07/26/2012 7:33

## 2015-04-07 NOTE — Consult Note (Signed)
Chief Complaint and History:   Referring Physician Bethann PunchesMark Miller, MD    Chief Complaint uncontrolled diabetes   Allergies:  Penicillin: Hives  Glucophage: Unknown  Assessment/Plan:   Assessment/Plan This is a 81 yo M with DM1 s/p recent lt BKA admitted from rehab with SOB attributed to pulm edema/CHF exacerbation. Sugars have been widely variable. He has had poor PO intake for over 3 weeks, it is slowly improving. Dtr states he's eating about 50% of tray now + Glucerna TID. Megace was added 2 days ago. In the last 24 hrs, sugars have varied from 118 - >600. Last night, he was given Lantus 15 units and today has received a total of 17 units Regular insulin on his sliding scale.   Pt was examined, interviewed, and chart was reviewed.  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 Will transition him back to AM Lantus dosing -- plan to give 5 units Lantus now and then 20 units qAM therafter.  2. Start mealtime NovoLog with just 3 units. This dose should be increased to 5 units as his appetite increases further 3. Change SSI to NovoLo, 1 unit per 150-200 and then 2 units additional per 50 over 201  Full consult to follow.    Case Discussed With patient, daughter at bedside   Electronic Signatures: Raj JanusSolum, Anna M (MD)  (Signed 26-Aug-13 15:22)  Authored: Chief Complaint and History, ALLERGIES, Assessment/Plan   Last Updated: 26-Aug-13 15:22 by Raj JanusSolum, Anna M (MD)

## 2015-04-07 NOTE — Consult Note (Signed)
Brief Consult Note: Diagnosis: Elevated troponin, ? NSTEMI vs demand/supply ischemia in setting of gangrene of left foot requiring amputation.   Patient was seen by consultant.   Consult note dictated.   Comments: REC  Agree with current therapy, cont metoprolol pre, peri and post-op, proceed with amputation without dealy, no further cardiac daignostics at this time, discussed with patient and family members, they understand risks.  Electronic Signatures: Marcina MillardParaschos, Shilynn Hoch (MD)  (Signed 09-Aug-13 17:38)  Authored: Brief Consult Note   Last Updated: 09-Aug-13 17:38 by Marcina MillardParaschos, Zaquan Duffner (MD)

## 2015-04-07 NOTE — H&P (Signed)
PATIENT NAME:  Aaron [name], Aaron [name] MR#:  [id number] DATE OF BIRTH:  1924-12-09  DATE OF ADMISSION:  11/06/2012  PRIMARY CARE PHYSICIAN: Bethann PunchesMark Miller, MD  CHIEF COMPLAINT: Shortness of breath.  HISTORY OF PRESENT ILLNESS: The patient is an 81 year old Caucasian male patient with history of systolic congestive heart failure with ejection fraction of 40%, diabetes mellitus, hypertension, aortic valve replacement, chronic atrial fibrillation, and recent cerebrovascular accident, on Coumadin, who presents to the Emergency Room complaining of worsening shortness of breath of three days. The patient was seen by home health at home and was found to be saturating between 81 to 86%. His primary care physician, Dr. Hyacinth MeekerMiller, was called who suggested either giving two extra doses of torsemide versus bringing him to the emergency room. His daughter chose to bring the patient to the emergency room. Here the patient was found to be saturating at 85% on room air. Chest x-ray is showing congestive heart failure. He also has an elevated BNP along with troponin of 1.1. The patient is being admitted to the hospitalist service. He has had PND and orthopnea. No edema. No complaints of chest pain but did progressive worsening shortness of breath, nonproductive cough. No abdominal pain, nausea, vomiting, or diarrhea. The patient and his daughter who is at bedside are not aware of any fluid restriction which has not been mentioned to the patient any time.   The patient was [facility] and then sent home with home health physical therapy after he had an acute stroke on the left side with right upper extremity weakness, which the daughter mentioned is significantly improved.   No recent change in medications.   PAST MEDICAL HISTORY:  1. Diabetes. 2. Hypertension. 3. Aortic valve replacement. 4. Coronary artery disease status post coronary artery bypass graft. 5. Severe peripheral vascular  disease. 6. Status post left below-knee amputation. 7. Depression. 8. Chronic atrial fibrillation, on Coumadin. 9. Chronic kidney disease, stage III. 10. Recent cerebrovascular accident with residual right upper extremity weakness.   ALLERGIES: Glucophage and penicillin.   SOCIAL HISTORY: The patient used to be a smoker, quit 50 years back. No alcohol abuse. No illicit drugs. Resides at home with home health.   FAMILY HISTORY: Significant for diabetes on mother's side and father had stroke and hypertension.   REVIEW OF SYSTEMS: CONSTITUTIONAL: Has fatigue and weakness. No weight loss or weight gain. EYES: No blurred vision, pain, or redness. ENT: No tinnitus, ear pain, or hearing loss. RESPIRATORY: No cough, wheeze, or hemoptysis. Does have shortness of breath. CARDIOVASCULAR: No chest pain. Has orthopnea and PND. No edema. GASTROINTESTINAL: No nausea, vomiting, diarrhea, or abdominal pain. GENITOURINARY: No dysuria, hematuria, or renal calculus. ENDOCRINE: No polyuria, nocturia, or thyroid problems. INTEGUMENTARY: No acne, rash, or lesions. MUSCULOSKELETAL: Has chronic arthritis. No gout. NEUROLOGIC: No focal numbness, weakness, or dysarthria, which is new. Does have residual right upper extremity weakness from recent stroke. PSYCH: No anxiety or depression.   HOME MEDICATIONS:  1. Amlodipine 5 mg 2 tablets oral once a day.  2. Aspirin 81 mg oral once a day.  3. Coumadin 2.5 mg oral once a day.  4. Calcium with vitamin D 1 tablet oral twice a day.  5. Cymbalta 30 mg oral once a day.  6. Lantus 30 units subcutaneous in the morning.  7. Lidocaine 5% topical film one to two patches topically to back once a day.  8. Metoprolol tartrate 50 mg 1-1/2 tablets oral twice a day.  9. MiraLax 17 mg oral once a day for constipation.  10. NovoLog 5 units subcutaneous three times daily with meals.  11. Potassium chloride 10 mEq oral daily.  12. Simvastatin 40 mg oral daily.  13. Torsemide 20 mg 1/2  tablet oral twice a day.  14. Tylenol 325 mg every 4 to 6 hours as needed for pain.  15. Coumadin 2.5 mg oral once a day. 16. Vitamin D3 1000 international units orally once a day.   PHYSICAL EXAMINATION:   VITAL SIGNS: Temperature 98, pulse 84, respirations 22, blood pressure 143/80, and saturating 84% on room air and 92% on 3 liters oxygen.   GENERAL: Elderly, frail Caucasian male patient lying in bed in significant respiratory distress, tachypneic.   PSYCHIATRIC: Alert and oriented, pleasant. Poor judgment.   HEENT: Atraumatic, normocephalic. Oral mucosa moist and pink. No oral ulcers or thrush. External ears and nose normal. No pallor. No icterus. Pupils bilaterally equal and reactive to light.   NECK: Supple. No thyromegaly. No palpable lymph nodes. Trachea midline. No carotid bruit or JVD.   CARDIOVASCULAR: S1 and S2 with systolic/diastolic murmur. Coronary artery bypass graft scar. No edema.   GASTROINTESTINAL: Soft abdomen, nontender. Bowel sounds present. No hepatosplenomegaly palpable.   RESPIRATORY: Increased work of breathing. Using accessory muscles. Bilateral basilar crackles. Decreased air entry.   GENITOURINARY: No CVA tenderness or bladder distention.   SKIN: Warm and dry. No petechiae, rash, or ulcers. Has chronic venous stasis in right lower extremity.   EXTREMITIES: Has left above-knee amputation.   NEUROLOGICAL: Motor strength decreased in right upper extremity, five out of five in other areas. Sensation to fine touch intact all over.   LYMPHATIC: No cervical lymphadenopathy.  LABORATORY, DIAGNOSTIC AND RADIOLOGIC DATA: Glucose 126. BNP 22,000. BUN 39, creatinine 1.56, and potassium 4.3. GFR 40. Troponin 1.10. CK 51. MB 3.5. WBC 12.8, hemoglobin 10.8, and platelets 296. INR 1.7.   EKG shows atrial fibrillation with right bundle branch block.   Chest x-ray shows pulmonary edema, chronic obstructive pulmonary disease changes.   ASSESSMENT AND PLAN:  1. Acute  on chronic systolic congestive heart failure, ejection fraction of 40%. We will continue the patient's beta blocker. Surprisingly, the patient is not on any ACE inhibitor. We will leave this decision to his primary care physician, Dr. Hyacinth Meeker, but the patient might benefit from a low-dose ACE or ARB with his systolic congestive heart failure with ejection fraction of 40% and CKD with diabetes. The patient will be started on Lasix, fluid restriction of 2 liters, and ins and outs. The patient is also not aware of any fluid restriction that needs to be followed. I have counseled the patient and family about fluid restriction and low salt diet. No need to check another 2-D echo.  2. Elevated troponin likely secondary from demand ischemia from congestive heart failure. We will have to rule out any NSTEMI which is still a possibility with the patient's history of CAD and CABG. Continue aspirin and statin.  3. Chronic kidney disease, stage III. Creatinine seems to be much improved from the last time, from 1.8 to 1.5. Needs to be followed now that the patient is on high-dose IV Lasix.  4. Aortic valve replacement along with chronic atrial fibrillation, on Coumadin. The patient's INR is subtherapeutic at 1.7. We will bridge him with Lovenox considering recent stroke and high risk for recurrent strokes. The patient is on rate control medications and his rate is between 70 to 85.  5. Diabetes mellitus. Continue  home medications and sliding scale insulin.  6. Recent cerebrovascular accident. The patient is working with physical therapy at home which will be continued. Continue the statin, aspirin, and Coumadin.  7. Deep venous thrombosis prophylaxis. The patient is on Coumadin.   CODE STATUS: DO NOT RESUSCITATE/DO NOT INTUBATE as discussed with the patient and daughter at bedside.       TIME SPENT: Time spent today on this case was 60 minutes with more than 50% time spent in coordination of  care. ____________________________ Molinda Bailiff. Deicy Rusk, MD srs:slb D: 11/06/2012 22:35:50 ET T: 11/07/2012 09:10:41 ET JOB#: [id number]  cc: Wardell Heath R. Elpidio Anis, MD, <Dictator> Danella Penton, MD Orie Fisherman MD ELECTRONICALLY SIGNED 11/10/2012 16:17

## 2015-04-07 NOTE — Consult Note (Signed)
Brief Consult Note: Comments: Have been by twice to see patient this pm: evidentally he is still in [facility] for arteriogram.  Electronic Signatures: Keturah BarreLondon, Christiane H (NP)  (Signed 08-Aug-13 16:20)  Authored: Brief Consult Note   Last Updated: 08-Aug-13 16:20 by Keturah BarreLondon, Christiane H (NP)

## 2015-04-07 NOTE — H&P (Signed)
PATIENT NAME:  Aaron [name], Aaron [name] MR#:  [id number] DATE OF BIRTH:  Jan 02, 1924  DATE OF ADMISSION:  08/09/2012  PRIMARY CARE PHYSICIAN: Bethann Punches, MD   CARDIOLOGIST: Marcina Millard, MD     CHIEF COMPLAINT: Shortness of breath, poor p.o. intake and weakness.   HISTORY OF PRESENT ILLNESS: The patient is an 81 year old male who was recently discharged to [facility] about six days ago. He returns back to the Emergency Room dye to shortness of breath, weakness and lethargy. As per the daughter, who gives most of the history, the patient was in his usual state of health when he was discharged but has had significantly poor p.o. intake since being at the skilled nursing facility. The patient also has been complaining of progressive shortness of breath and about a 10-pound weight gain in the past two weeks. This morning, the patient was having significant shortness of breath, and his oxygen saturations despite being on oxygen were down to the mid 80s. He was, therefore, referred to the ER for further evaluation. The patient denied any chest pain, did admit to some nausea and some dry heaving. No abdominal pain, no fevers, no chills, no cough, no other associated symptoms presently. The patient arrived to the ER and was noted to be slightly hypoxic and noted to be in mild congestive heart failure with an elevated troponin. Hospitalist Service was contacted to further treatment and evaluation.   REVIEW OF SYSTEMS: CONSTITUTIONAL: No documented fever. Positive weight gain, about 10 pounds in the past two weeks. No weight loss. EYES: No blurred or double vision. ENT: No tinnitus. No postnasal drip. No redness of the oropharynx. RESPIRATORY: Positive cough. No wheeze, no hemoptysis. Positive dyspnea. CARDIOVASCULAR: No chest pain, no orthopnea, no palpitations, no syncope. GASTROINTESTINAL: Positive nausea. Positive dry heaving, no vomiting. No abdominal pain, no melena or hematochezia. GU: No dysuria, no  hematuria. ENDOCRINE: No polyuria or nocturia. No heat or cold intolerance. HEMATOLOGIC: No anemia. No bruising, no bleeding. INTEGUMENTARY: No rashes. No lesions. MUSCULOSKELETAL: No arthritis, no swelling, and no gout. NEUROLOGIC: No numbness, no tingling, no ataxia, no seizure-type activity. PSYCHIATRIC: No anxiety, no insomnia, no ADD.    PAST MEDICAL HISTORY:  1. Diabetes.  2. Hypertension.  3. Hyperlipidemia.  4. Severe peripheral vascular disease, status post left below the knee amputation.  5. Coronary disease, status post coronary artery bypass graft.  6. History of aortic valve replacement.  7. Depression.  8. History of chronic atrial fibrillation.  9. Chronic kidney disease, stage 3.   ALLERGIES: Glucophage and penicillin.   SOCIAL HISTORY: He used to be a smoker, quit about 50 years ago. No alcohol abuse. No illicit drug abuse. Currently he resides at [facility] skilled nursing facility.   FAMILY HISTORY: Significant for diabetes on the mother's side. Father had stroke and hypertension.   CURRENT MEDICATIONS:  1. Calcium with vitamin D 1 tab b.i.d.  2. Coumadin 3.75, alternated with 2.5 mg every other day.  3. Cymbalta 30 mg daily.  4. Demadex 10 mg daily.  5. Lantus 20 units in the morning. 6. Lopressor 50 mg b.i.d.  7. Norco 5/325, 1 to 2 tabs every 6 hours as needed.  8. Norvasc 5 mg daily.  9. Novolin regular insulin sliding scale.  10. Potassium 10 mEq daily.  11. Septra Double Strength 1 tab b.i.d.  12. Tylenol Extra Strength 1 to 2 tabs as needed.  13. Vitamin D3, 1000 international units daily.  14. Zocor 40 mg at bedtime.  PHYSICAL EXAMINATION:   VITAL SIGNS: On admission, temperature is 98.4, pulse 105, respirations 22, blood pressure 132/82, saturations 93% on 2 liters nasal cannula.   GENERAL: He is a pleasant-appearing male in no apparent distress.   HEENT: Atraumatic, normocephalic. Extraocular muscles are intact. Pupils are equal and reactive  to light. Sclerae are anicteric. No conjunctival injection. No pharyngeal erythema.   NECK: Supple. There is no jugular venous distention, no bruits, no lymphadenopathy, no thyromegaly.   HEART: Regular rate and rhythm. No murmurs, no rubs, and no clicks.   LUNGS: He has some diffuse end expiratory wheezing. Negative use of accessory muscles. No dullness to percussion.   ABDOMEN: Soft, flat, nontender, nondistended, has good bowel sounds. No hepatosplenomegaly appreciated.   EXTREMITIES: No evidence of any cyanosis or clubbing. He does have a left-sided below-knee amputation with an Ace wrap near the wound. No drainage noted. Signs of PVD noted on the right lower extremity.   SKIN: Moist and warm with no rashes.   LYMPHATIC: There is no cervical or axillary lymphadenopathy.   NEUROLOGIC: The patient is alert, awake, and oriented x3 with no focal motor or sensory deficits appreciated bilaterally.   LABORATORY, DIAGNOSTIC, AND RADIOLOGICAL DATA: Serum glucose of 277, BNP 19,099, BUN 32, creatinine 2.2, sodium 135, potassium 5.2, chloride 105, bicarbonate 27. Troponin 0.61. White cell count is 17.1, hemoglobin 9.5, hematocrit 30.0, and platelet count of 433. INR is 2.8. The patient did have a chest x-ray done which showed findings to represent pulmonary edema. The patient also had a CT of the head done without contrast showing no acute intracranial abnormality.   ASSESSMENT AND PLAN: The patient is an 81 year old male with a history of diabetes, hypertension, hyperlipidemia, peripheral vascular disease, status post recent left amputation, aortic valve replacement, coronary artery disease, status post coronary artery bypass graft, chronic atrial fibrillation, chronic kidney disease, stage 3, presented to the hospital with confusion, hallucinations, weakness and also progressive shortness of breath.  1. Acute congestive heart failure: This is likely the cause of the patient's shortness of breath. The  patient's chest x-ray findings are suggestive of pulmonary edema. The patient has had a weight gain and clinically presents with symptoms of heart failure. I will gently diurese him with IV Lasix, follow ins and outs and daily weights. I will continue his beta blocker. No ACE inhibitor given the fact that he has got chronic kidney disease. The patient is followed by Dr. Darrold JunkerParaschos.  I will go ahead and consult him and also get a two-dimensional echocardiogram.  2. Elevated troponin: This is likely in the setting of hypoxemia and shortness of breath from congestive heart failure. I will place the patient on telemetry, continue to follow serial cardiac markers, continue beta blocker and statin. We will get a Cardiology consult and get a two-dimensional echocardiogram for now. The patient is currently chest pain free.  3. Acute renal failure: This is acute on chronic renal failure. The patient has underlying chronic kidney disease stage 3, baseline creatinine of 1.5, currently elevated at 2.2. It is questionable if this is related to congestive heart failure versus prerenal azotemia as the patient has had poor p.o. intake, too. Although clinical presentation is consistent with congestive heart failure, I will go ahead and gently diurese, follow BUN and creatinine and urine output. The patient is also on Bactrim which likely could also be the cause of the patient's slightly elevated creatinine. If needed, we will consider getting a Nephrology consult.  4. Hyperkalemia: This  is likely in the second setting of acute renal failure, also could be compounded with the use of Bactrim. We will follow serum potassium and follow renal function closely.  5. Hyperlipidemia: Continue Zocor.  6. Depression:  Continue Cymbalta.  7. History of chronic atrial fibrillation: Continue Lopressor for rate control. Continue Coumadin. His INR is therapeutic.  8. Confusion, hallucinations and altered mental status: This is likely  metabolic encephalopathy from the renal failure and also congestive heart failure. The patient's mental status is now back down to baseline. The CT of the head was negative. He has a nonfocal neurological exam. We will follow his mental status closely.   CODE STATUS: The patient is a FULL CODE.   TIME SPENT WITH ADMISSION: 50 minutes.  ____________________________ Rolly Pancake. Cherlynn Kaiser, MD vjs:cbb D: 08/09/2012 15:04:44 ET T: 08/09/2012 16:11:39 ET JOB#: [id number] cc: Danella Penton, MD Houston Siren MD ELECTRONICALLY SIGNED 08/15/2012 22:33

## 2015-04-07 NOTE — Discharge Summary (Signed)
PATIENT NAME:  Aaron [name], Lyrick K MR#:  [id number] DATE OF BIRTH:  [birth year]  DATE OF ADMISSION:  08/09/2012 DATE OF DISCHARGE:  08/14/2012  DISCHARGE DIAGNOSES:  1. Congestive heart failure, ejection fraction of 40%.  2. Diabetes mellitus, insulin requiring, volatile.  3. Severe peripheral vascular disease.  4. Malnutrition.  5. Chronic atrial fibrillation.  6. Chronic renal dysfunction, stage III.  7. Hyperlipidemia.  8. Status post below-knee amputation from peripheral vascular disease.   DISCHARGE MEDICATIONS:  1. Tylenol 650 mg q.4 p.r.n. pain/fever.  2. Calcium 500 mg/vitamin D 200 units daily.  3. Cymbalta 30 mg daily.  4. Lopressor 50 mg b.i.d.  5. Zocor 40 mg at bedtime.  6. Warfarin 2.5 mg at bedtime.  7. Diaper dermatitis ointment t.i.d.  8. Megace 40 mg daily.  9. Torsemide 10 mg b.i.d.  10. NovoLog insulin 3 units q.a.c.  11. Lantus insulin 20 units q.a.m. sliding scale insulin.   REASON FOR ADMISSION: 81 year old male presents with congestive heart failure. Please see history and physical for history of present illness, past medical history, and physical exam.   HOSPITAL COURSE: Patient was admitted, he diuresed about 5 liters of urine. His ejection fraction was 40%. He was intolerant to ACE inhibitors due to his renal dysfunction and hyperkalemia. He is on the above medications. Will be sent to skilled nursing with torsemide. His sugars were regulated with NovoLog insulin. Norvasc, blood pressure pill was discontinued. He had a metabolic encephalopathy related to his renal dysfunction and with diuresis his creatinine came down to 1.8. His potassium stays generally in the mid 5s. His family and he requested a DO NOT RESUSCITATE status. Overall prognosis is guarded. He is back to baseline mental status, eating better.   PROGNOSIS: Poor.  ____________________________ Danella PentonMark F. Jaklyn Alen, MD mfm:cms D: 08/14/2012 07:28:32 ET T: 08/14/2012 07:39:33 ET JOB#: [id number]  cc: Danella PentonMark  F. Lyric Rossano, MD, <Dictator> Annastasia Haskins Sherlene ShamsF Laurian Edrington MD ELECTRONICALLY SIGNED 08/14/2012 8:35

## 2015-04-07 NOTE — Discharge Summary (Signed)
PATIENT NAME:  Aaron [name], [name] MR#:  [id number] DATE OF BIRTH:  1924-10-28  DATE OF ADMISSION:  07/24/2012 DATE OF DISCHARGE:  08/03/2012  FINAL DIAGNOSES: 1. Left toe infarction, ischemia.  2. Cellulitis.  3. Atrial fibrillation.  4. Congestive heart failure.   PROCEDURE PERFORMED THIS ADMISSION: Left below-knee amputation done by Dr. Festus BarrenJason Dew on 07/26/2012.   COMPLICATIONS: None.   CONSULTATIONS:  1. Dr. Lutricia FeilPaul Oh.  2. Dr. Marcina MillardAlexander Paraschos.   3. Dr. Mady HaagensenMunsoor Lateef.  4. Dr. Bethann PunchesMark Miller.  5. Dr. Arnoldo HookerBruce Kowalski.   HOSPITAL COURSE: Patient was admitted. He underwent his procedure on 07/25/2012 without complication. Following surgery he was transferred from the PAC-U to the orthopedic floor where vital signs remained stable. He did receive preoperative and postoperative IV antibiotics and required no transfusion during this admission. His left below-knee amputation incision remained clean, dry, and intact. A wound VAC was applied while patient was in the PAC-U. Patient's wound VAC was discontinued postoperative day #4. He was deemed stable for discharge to his skilled nursing facility on 08/03/2012.   LABORATORY, DIAGNOSTIC AND RADIOLOGICAL DATA: Hemoglobin 9.5, hematocrit 28.9. He does have an elevated white blood cell count.   DISCHARGE MEDICATIONS:  1. Aspirin 81 mg.  2. Cymbalta 30 mg.  3. Simvastatin 40 mg.  4. Amlodipine 5 mg.  5. Vitamin D3 1000 units.  6. Tylenol Extra Strength 500 mg. 7. Lantus 100 units.  8. Warfarin 2.5 mg. 9. NovoLog 100 units.  10. Calcar with D 600 mg.  11. Potassium chloride 10 mg.  12. Torsemide 20 mg. 13. Metoprolol 50 mg.  14. Norco 5/325.   15. Septra 160 mg.  DISPOSITION: Patient is discharged to a skilled nursing facility in stable condition on 08/03/2012.   DISCHARGE INSTRUCTIONS: He will continue weight bearing to tolerance on his right lower extremity. Surgical clips will be removed in three weeks on his follow-up appointment in the  office. Patient will be discharged today to skilled nursing facility for gait training and strengthening. He has a follow-up appointment with Dr. Gilda CreaseSchnier in three weeks postoperatively and understands to call in the interim with any problems or questions.   ____________________________ Terrence DupontJason M. Cora Stetson, PA-C jmk:cms D: 08/03/2012 08:16:03 ET T: 08/03/2012 08:31:04 ET JOB#: [id number]  cc: Terrence DupontJason M. Kedar Sedano, PA-C, <Dictator> Terrence DupontJASON M Nara Paternoster PA ELECTRONICALLY SIGNED 08/08/2012 7:43

## 2015-04-07 NOTE — Consult Note (Signed)
Brief Consult Note: Diagnosis: CHF, mildly elevated troponoin, no CP or ECG changes, probable demand /supply ischemia.   Patient was seen by consultant.   Consult note dictated.   Comments: REC  Agree with current therapy, cont diuesis, defer full doe anticoagultion, defer further cardiac diagnostics.  Electronic Signatures: Marcina MillardParaschos, Iliany Losier (MD)  (Signed [id number]-Nov-13 12:37)  Authored: Brief Consult Note   Last Updated: 20-Nov-13 12:37 by Marcina MillardParaschos, Cassell Voorhies (MD)

## 2015-04-07 NOTE — Consult Note (Signed)
Brief Consult Note: Diagnosis: HTN, DM.   Patient was seen by consultant.   Consult note dictated.   Orders entered.   Comments: Resume lantus, as pt will need INR to drop before going for procedure.  BP meds adjusted.  Electronic Signatures: Lynnea FerrierKlein III, Emry Tobin J (MD)  (Signed 06-Aug-13 19:28)  Authored: Brief Consult Note   Last Updated: 06-Aug-13 19:28 by Lynnea FerrierKlein III, Mariam Helbert J (MD)

## 2015-04-07 NOTE — Consult Note (Signed)
PATIENT NAME:  Aaron [name], Aaron [name] MR#:  [id number] DATE OF BIRTH:  1924/01/16  DATE OF CONSULTATION:  07/27/2012  REFERRING PHYSICIAN:  Levora Dredge, MD CONSULTING PHYSICIAN:  Anadalay Macdonell Lizabeth Leyden, MD  REASON FOR CONSULTATION: Acute renal failure in the setting of chronic kidney disease stage III with recent contrast exposure.   HISTORY OF PRESENT ILLNESS: The patient is an 81 year old Caucasian male with past medical history of coronary artery disease, diabetes mellitus, chronic kidney disease stage III with suspected diabetic nephropathy, valvular heart disease status post repair, peripheral vascular disease with history of left iliac artery stenting who presented to [facility] on 08/06 with cyanotic-appearing left foot and toes. He was seen by the vascular service during this admission. He underwent angiogram on 07/26/2012 with percutaneous transluminal angioplasty of the left popliteal artery and left anterior tibial artery. There was also a stent placed in the left anterior tibial artery. However, it was felt that his left foot could not likely be saved. The patient's creatinine upon admission was 1.69. On the day of the angiogram creatinine was 1.55. Creatinine is now up to 2.35. Therefore, he likely has contrast-induced nephropathy. It has been recommended that he consider below-the-knee amputation; however, it appears that the patient is unwilling to undergo this. The patient's documented urine output yesterday was 400 mL. He is a bit lethargic today but is arousable. His daughter is currently at the bedside. It also appears that he has had a mild myocardial infarction with a troponin up to 3.0. He has been evaluated by cardiology for this as well. He also appears to have a leukocytosis at this time. He had an acquired coagulopathy but his INR is now down to 1.5.   PAST MEDICAL HISTORY:  1. Hypertension.  2. Diabetes mellitus with poor control.  3. Chronic kidney disease,  stage III.  4. Coronary artery disease status post coronary artery bypass graft.  5. Aortic valvular stenosis status post bioprosthetic aortic valve replacement.  6. Atrial fibrillation.  7. Hernia repair x3.  8. T11, T12, L1 kyphoplasty.  9. History of right acetabular fracture.  10. Peripheral vascular disease particularly in the left lower extremity.   ALLERGIES: Metformin and penicillin.   CURRENT INPATIENT MEDICATIONS:  1. One half normal saline at 50 mL/h. 2. Amlodipine 10 mg daily, which is currently on hold. 3. Aspirin 81 mg daily.  4. Lipitor 40 mg p.o. at bedtime.  5. Colace 200 mg p.o. b.i.d.  6. Cymbalta 30 mg p.o. daily.  7. Lantus insulin 10 units subcutaneous every 24 hours.  8. Sliding scale insulin.  9. Metoprolol 50 mg p.o. every 12 hours.  10. Zofran 4 mg IV every six hours p.r.n.  11. MiraLAX 17 grams p.o. daily p.r.n.  12. Zantac 150 mg x1.  13. Tylenol 650 mg p.o. every six hours p.r.n.  14. Norco 5/325, one tablet p.o. q.4 hours p.r.n.  15. Protonix 40 mg IV every 12 hours.  16. Ceftriaxone 1 gram IV every 24 hours.  17. Metronidazole 500 mg p.o. q.8 hours.  18. Nitroglycerin 0.2 mg topically daily.  19. Lovenox 75 mg subcutaneous daily.   SOCIAL HISTORY: The patient lives off of [place]. He lives alone. He has two daughters that are relatively local. He has an aide that comes in between 9:00 a.m. to 2:00 p.m. He is a widower. No reported tobacco, alcohol, or illicit drug use. He used to work as an Public affairs consultant.   FAMILY HISTORY: Mother deceased at  an old age, unclear what caused her death. Father died at age 99 from some type of undefined cardiovascular event per the patient's daughter.   REVIEW OF SYSTEMS: CONSTITUTIONAL: Denies fevers, chills, or weight loss. EYES: Denies diplopia, blurry vision. HEENT: Denies headaches, hearing loss, tinnitus, epistaxis, sore throat. CARDIOVASCULAR: Currently denies chest pains or palpitations. Appears to  have had a mild myocardial infarction this admission. RESPIRATORY: Denies cough, shortness of breath, or hemoptysis. GASTROINTESTINAL: Denies nausea, vomiting, diarrhea. GU: Denies frequency, urgency, or dysuria. MUSCULOSKELETAL: Denies joint pains. However, he has significant limb pain in his left foot. INTEGUMENTARY: Denies skin rashes or lesions. NEUROLOGIC: Denies focal extremity weakness. He is a bit lethargic but is arousable. PSYCHIATRIC: Denies depression or bipolar disorder. ENDOCRINE: Denies polyuria or polydipsia. Does have poorly controlled diabetes mellitus however. HEMATOLOGIC/LYMPHATIC: Denies easy bruisability, bleeding, or swollen lymph nodes. ALLERGY/IMMUNOLOGIC: Denies seasonal allergies or history of immunodeficiency.   PHYSICAL EXAMINATION:  VITAL SIGNS: Temperature 98.1, pulse 85, respirations 18, blood pressure 139/76, pulse oximetry 92% on 1 liter.   GENERAL: Ill-appearing male who is a bit lethargic but is arousable.   HEENT: Normocephalic, atraumatic. Extraocular movements are intact. Pupils are equal, round, and reactive to light. No scleral icterus. Conjunctivae are pink. No epistaxis noted. Gross hearing intact. Oral mucosa are dry.   NECK: Supple without JVD or lymphadenopathy.   LUNGS: Clear to auscultation bilaterally with normal respiratory effort.   HEART: S1, S2. Periods of irregularity noted. 2/6 systolic ejection murmur heard.   ABDOMEN: Soft, nontender, nondistended. Bowel sounds positive. No rebound or guarding. No gross organomegaly appreciated.   EXTREMITIES: There is significant cyanosis of the left forefoot. There does appear to be a demarcation line on the left foot extending about halfway on his left foot. There is an area of necrosis particularly in the first left great toe.   NEUROLOGICAL: The patient is alert and oriented to time, person, and place. Strength is five out of five in both upper extremities.   SKIN: Warm and dry. Cyanosis noted of the  left foot as above.   GU: No suprapubic tenderness is noted at this time.   PSYCHIATRIC: The patient has an appropriate affect and appears to have good insight into his current illness.   LABORATORY DATA: Sodium 131, potassium 5, chloride 97, CO2 24, BUN 46, creatinine 2.35, glucose 258, total protein 6.8, albumin 2.6, total bilirubin 0.7, alkaline phosphatase 80, AST 32, ALT 22. Troponin of 3. CBC shows WBC 18.4, hemoglobin 12.7, hematocrit 38, platelets 275. INR 1.5.   IMPRESSION/RECOMMENDATIONS: This is an 81 year old Caucasian male with past medical history of hypertension, diabetes mellitus, hyperlipidemia, coronary artery disease status post coronary artery bypass graft, aortic valvular stenosis status post bioprosthetic aortic valve replacement, B12 deficiency, atrial fibrillation, hernia repair x3, multilevel kyphoplasty, peripheral vascular disease, chronic kidney disease, stage III who presented to [facility] with ischemic left foot and is status post angiogram on 07/26/2012.  1. Acute renal failure in the setting of chronic kidney disease, stage III. The patient's baseline creatinine appears to be between 1.5 to 1.7. He received contrast on 07/26/2012. The patient is at high risk for contrast-induced nephropathy given his underlying diabetes mellitus and underlying chronic kidney disease. It does appear that he has contrast-induced nephropathy now as his creatinine is 2.35. I discussed implications of this with the patient and his daughter. There is a chance that renal function could continue to deteriorate. This could deteriorate to the point of consideration of renal replacement  therapy. However, it is unclear as to whether the family would want to consider this at this point in time as currently they are favoring not pursuing any treatment for the left lower extremity. For now, we would continue supportive care. Would certainly avoid any further nephrotoxins including  NSAIDs, IV contrast, or ACE inhibitor. We will continue to monitor his urine output as well as his renal function day to day for now.  2. Hyponatremia. Likely due to the use of hypotonic fluids. Therefore, we will discontinue half-normal saline in the favor of normal saline. Follow-up serum sodium in the a.m.  3. Peripheral vascular disease. He was found to have significant peripheral vascular disease. He had stent placement in the left lower extremity. However, it does not appear that this will allow for salvage of his left lower extremity. Amputation has been recommended to him. However, the patient has declined. He realizes the implications of this.  I would like to thank Dr. Gilda CreaseSchnier for this kind referral. Further plan as the patient progresses.   ____________________________ Lennox PippinsMunsoor N. Pablo Mathurin, MD mnl:ap D: 07/27/2012 18:31:09 ET T: 07/28/2012 08:37:12 ET JOB#: [id number] cc: Lennox PippinsMunsoor N. Klyn Kroening, MD, <Dictator> Lennox PippinsMUNSOOR N Jennica Tagliaferri MD ELECTRONICALLY SIGNED 08/21/2012 8:46

## 2015-04-07 NOTE — Op Note (Signed)
PATIENT NAME:  Aaron [name], REICHENBERGER MR#:  [id number] DATE OF BIRTH:  Feb 06, 1924  DATE OF PROCEDURE:  07/26/2012  PREOPERATIVE DIAGNOSES:    1. Peripheral arterial disease with gangrene, left lower extremity.  2. Renal insufficiency.  3. Diabetes.  4. Atrial fibrillation.   POSTOPERATIVE DIAGNOSES:    1. Peripheral arterial disease with gangrene, left lower extremity.  2. Renal insufficiency.  3. Diabetes.  4. Atrial fibrillation.   PROCEDURES:    1. Ultrasound guidance for vascular access, right femoral artery.  2. Catheter placement into left anterior tibial artery from right femoral approach.  3. Aortogram and selective left lower extremity angiogram.  4. Percutaneous transluminal angioplasty of left popliteal artery with 5 mm diameter angioplasty balloon.  5. Percutaneous transluminal angioplasty of left anterior tibial artery with 3 and 4 mm diameter angioplasty balloon.  6. Self-expanding stent placement left anterior tibial artery for continued occlusion after angioplasty.  7. StarClose closure device, right femoral artery.   SURGEON: Annice Needy, M.D.   ANESTHESIA: Local with moderate conscious sedation.   ESTIMATED BLOOD LOSS: 25 mL.   INDICATION FOR PROCEDURE: An 81 year old white male with gangrenous toes. He is brought down for an angiogram for evaluation. Risks and benefits were discussed. Informed consent was obtained.   DESCRIPTION OF PROCEDURE: The patient was brought to the vascular interventional radiology suite. Groins were prepped and a sterile surgical field was created. The right femoral head was localized with fluoroscopy. We accessed the right femoral artery under ultrasound without difficulty with a Seldinger needle. J-wire and 5 French sheath were placed. Pigtail catheter was placed in the aorta at the L1 level and AP aortogram was performed. This showed no flow-limiting stenosis in the aortoiliac segments. The renal arteries were patent. I unhooked the aortic  bifurcation and advanced to the left femoral head. Selective left lower extremity angiogram was then performed. This showed patency of the common femoral artery, profunda femoris artery and superficial femoral artery. The popliteal artery was patent down through the previously placed stent but occluded just below the knee. He reconstituted mostly a peroneal distally, but had very sluggish flow and a lot of collateralization. I put a 6 [place] high flex Ansel sheath and was able to cross the lesion and gain access into an anterior tibial artery. This was somewhat surprisingly. I attempted to cannulate the peroneal artery for quite some time unsuccessfully and when I was able to get access into the midfoot with an anterior tibial artery and confirm intraluminal flow, I replaced an 0.018 wire. I ballooned the whole area with a 3 mm diameter angioplasty balloon from the ankle to the popliteal. The popliteal was treated with a 5 mm diameter angioplasty balloon. Despite this, there was still continued occlusion. I used a 4 mm balloon in the anterior tibial artery hoping to promote flow without result. When there was still occlusive disease, I placed a 4 mm diameter stent in the anterior tibial artery and ironed this out with a balloon. Even with a catheter in the anterior tibial artery, the flow was very sluggish distally and at this point, I thought we had done what we could for his tibial disease. I could not cannulate the peroneal artery and elected to terminate the procedure at this point as the patient had become quite combative and restless. A StarClose closure device was deployed in the usual fashion with excellent hemostatic result. The patient was taken to the recovery room in stable condition.   ____________________________ Annice Needy,  MD jsd:ap D: 07/27/2012 08:44:00 ET T: 07/27/2012 10:30:06 ET JOB#: [id number]  cc: Annice NeedyJason S. Dew, MD, <Dictator>  Annice NeedyJASON S DEW MD ELECTRONICALLY SIGNED 07/30/2012 9:04

## 2015-04-07 NOTE — Consult Note (Signed)
Pt seen and examined. Pt with ischemic left toe. So far not responding to stent placement done yest. Asked to see patient for nausea, which only started in hospital. No prior GI sxs, according to patient. No hx of PUD. Long hx of DM. Had vomiting last night, poss due to anesthesia. No nausea today. BKA discussed. So far, patient refuses surgery. Troponin elevated. Nausea likely related to meds, such as morphine or flagyl, etc. No nausea today. Started clears. No plan for EGD today, esp in light of troponin going up. Conservative GI measures. Advance diet as tolerated. Call us back if condition changes. Dr. Mechele CollinElliott will take over my services next week. Thanks.  Electronic Signatures: Lutricia Feilh, Muskaan Smet (MD)  (Signed on 09-Aug-13 11:53)  Authored  Last Updated: 09-Aug-13 11:53 by Lutricia Feilh, Kyera Felan (MD)

## 2015-04-07 NOTE — Op Note (Signed)
PATIENT NAME:  Aaron [name], Aaron [name] MR#:  [id number] DATE OF BIRTH:  16-Oct-1924  DATE OF PROCEDURE:  07/29/2012  PREOPERATIVE DIAGNOSIS: Atherosclerotic occlusive disease of bilateral lower extremities with gangrene of the left foot.   POSTOPERATIVE DIAGNOSIS: Atherosclerotic occlusive disease of bilateral lower extremities with gangrene of the left foot.   PROCEDURE PERFORMED: Left below-knee amputation.   SURGEON: Renford DillsGregory G. Damontay Alred, M.D.   ANESTHESIA: Spinal with sedation.  ESTIMATED BLOOD LOSS: 450 mL.   SPECIMEN: Distal left foot to pathology for permanent section.   INDICATIONS: The patient is admitted to the hospital with gangrene of the left foot, increasing leg pain and signs and symptoms consistent with sepsis. He has been initiated on antibiotics and after cardiology and medicine have evaluated the patient and he has consented to amputation, he is being brought to the operating room. The risks and benefits were reviewed, all questions answered, and all are in agreement with proceeding with surgery.   DESCRIPTION OF PROCEDURE: The patient is taken to the Operating Room. After adequate spinal anesthesia is induced, he is positioned supine and sedated. The left leg is then prepped and draped in a circumferential fashion. Working approximately one handbreadth below the tibial tuberosity, umbilical tape is used to mark circumferentially the calf. It is then divided into thirds and a posterior flap is drawn on the skin surface. A 10 blade is then used to incise the skin, carried down through the fascia. Muscle bellies are transected with Bovie cautery. Bleeding is controlled with Bovie and/or 3-0 silk suture. Anterior tibial vessels are identified, clamped and then ligated and divided. Posterior tibials in a similar fashion are ligated and divided. The periosteum of the tibia is then elevated and Gigli saw is used to transect the tibia. The periosteum of the fibula is elevated and bone shears are  used to transect the fibula approximately 2 cm above the tibia. Amputation knife is then used to transect the posterior muscle bellies. Bleeding is rapidly controlled with hemostat and subsequently 3-0 and 0 silk suture. Nerve is identified, ligated with an 0 Vicryl, and resected so that it would retract deeply into the muscle bellies more proximally. Some of the gastrocs is then debrided to allow for a tension-free closure and after rasp is used to smooth the cut tibia, the wound is irrigated with a 1.5 liters of saline. Fascia was then closed using 0 Vicryl, the skin is closed with staples, and a VAC is applied. The patient tolerated the procedure without hemodynamic instability and he is taken to the recovery area in excellent condition.  ____________________________ Renford DillsGregory G. Pritika Alvarez, MD ggs:slb D: 07/29/2012 13:09:54 ET T: 07/30/2012 09:59:14 ET JOB#: [id number]  cc: Renford DillsGregory G. Magen Suriano, MD, <Dictator> Renford DillsGREGORY G Kharis Lapenna MD ELECTRONICALLY SIGNED 08/14/2012 10:49

## 2015-04-07 NOTE — Op Note (Signed)
PATIENT NAME:  Aaron [name], Aaron [name] MR#:  [id number] DATE OF BIRTH:  08/08/1924  DATE OF PROCEDURE:  07/25/2012  PREOPERATIVE DIAGNOSES:    1. Peripheral arterial disease with gangrene, left lower extremity.  2. Renal insufficiency.  3. Diabetes.  4. Atrial fibrillation.   POSTOPERATIVE DIAGNOSES:    1. Peripheral arterial disease with gangrene, left lower extremity.  2. Renal insufficiency.  3. Diabetes.  4. Atrial fibrillation.   PROCEDURES:    1. Ultrasound guidance for vascular access, right femoral artery.  2. Catheter placement into left anterior tibial artery from right femoral approach.  3. Aortogram and selective left lower extremity angiogram.  4. Percutaneous transluminal angioplasty of left popliteal artery with 5 mm diameter angioplasty balloon.  5. Percutaneous transluminal angioplasty of left anterior tibial artery with 3 and 4 mm diameter angioplasty balloon.  6. Self-expanding stent placement left anterior tibial artery for continued occlusion after angioplasty.  7. StarClose closure device, right femoral artery.   SURGEON: Annice NeedyJason S. Dew, M.D.   ANESTHESIA: Local with moderate conscious sedation.   ESTIMATED BLOOD LOSS: 25 mL.   INDICATION FOR PROCEDURE: An 81 year old white male with gangrenous toes. He is brought down for an angiogram for evaluation. Risks and benefits were discussed. Informed consent was obtained.   DESCRIPTION OF PROCEDURE: The patient was brought to the vascular interventional radiology suite. Groins were prepped and a sterile surgical field was created. The right femoral head was localized with fluoroscopy. We accessed the right femoral artery under ultrasound without difficulty with a Seldinger needle. J-wire and 5 French sheath were placed. Pigtail catheter was placed in the aorta at the L1 level and AP aortogram was performed. This showed no flow-limiting stenosis in the aortoiliac segments. The renal arteries were patent. I unhooked the aortic  bifurcation and advanced to the left femoral head. Selective left lower extremity angiogram was then performed. This showed patency of the common femoral artery, profunda femoris artery and superficial femoral artery. The popliteal artery was patent down through the previously placed stent but occluded just below the knee. He reconstituted mostly a peroneal distally, but had very sluggish flow and a lot of collateralization. I put a 6 [place] high flex Ansel sheath and was able to cross the lesion and gain access into an anterior tibial artery. This was somewhat surprisingly. I attempted to cannulate the peroneal artery for quite some time unsuccessfully and when I was able to get access into the midfoot with an anterior tibial artery and confirm intraluminal flow, I replaced an 0.018 wire. I ballooned the whole area with a 3 mm diameter angioplasty balloon from the ankle to the popliteal. The popliteal was treated with a 5 mm diameter angioplasty balloon. Despite this, there was still continued occlusion. I used a 4 mm balloon in the anterior tibial artery hoping to promote flow without result. When there was still occlusive disease, I placed a 4 mm diameter stent in the anterior tibial artery and ironed this out with a balloon. Even with a catheter in the anterior tibial artery, the flow was very sluggish distally and at this point, I thought we had done what we could for his tibial disease. I could not cannulate the peroneal artery and elected to terminate the procedure at this point as the patient had become quite combative and restless. A StarClose closure device was deployed in the usual fashion with excellent hemostatic result. The patient was taken to the recovery room in stable condition.   ____________________________ Annice NeedyJason S. Dew,  MD jsd:ap D: 07/27/2012 08:44:51 ET T: 07/27/2012 10:30:06 ET JOB#: [id number]  cc: Annice Needy, MD, <Dictator>

## 2015-04-07 NOTE — Consult Note (Signed)
PATIENT NAME:  Aaron [name], Aaron [name] MR#:  [id number] DATE OF BIRTH:  02-01-1924  DATE OF CONSULTATION:  08/13/2012  REFERRING PHYSICIAN:  Emily Filbert, MD  CONSULTING PHYSICIAN:  A. Lavone Orn, MD  CHIEF COMPLAINT: Uncontrolled diabetes.   HISTORY OF PRESENT ILLNESS: This is an 81 year old male seen in consultation for uncontrolled diabetes. He was admitted on August 22nd for shortness of breath attributed to pulmonary edema. He has recently undergone a left below the knee amputation and had been discharged six days prior to admission to [facility] for rehab. Blood sugars have been erratic over the course of admission and he was initially started on Lantus 20 units at bedtime and a regular insulin sliding scale of 3 to 2 units per 50 over a target of 200. On his third hospital day, he had low sugars on waking in the 40's. Since that time his bedtime Lantus has been held. Sugars have been fairly high and in the last 24 hours have been in the range of 339 to over 600. He was interviewed with his daughter at the bedside. She explains he has had poor appetite for the last several weeks, only recently has any appetite been coming back. He is taking in about three Glucerna shakes a day and typically less than half of his mealtime trays. Megace 40 mg daily was added two days ago to increase his appetite.   The patient is known to me from outpatient clinic. I initially met him in June of this year. He has had diabetes for over 42 years and has type I diabetes. He had been doing well on a regimen of Lantus 20 units in the morning and NovoLog 5 units q.a.c. plus a sliding scale of 2 units per 50 over a target of 200. Diabetes is complicated by stage III chronic kidney disease and diabetic retinopathy.   PAST MEDICAL HISTORY:  1. Type I diabetes.  2. Peripheral neuropathy.  3. Diabetic nephropathy.  4. Stage III chronic kidney disease.  5. History of aortic stenosis.  6. Chronic atrial fibrillation.   7. Osteoporosis.  8. Peripheral vascular disease status post left BKA 07/2012.  9. History of aortic valve replacement status post bioprosthetic valve.  10. CABG.   ALLERGIES: Glucophage and penicillin.   CURRENT INPATIENT MEDICATIONS:  1. Megace 40 mg daily.  2. Amlodipine 5 mg daily.  3. Calcium carbonate 500/Vitamin D 200 units daily.  4. Cholecalciferol 1000 units daily.  5. Cymbalta 30 mg daily.  6. Lopressor 50 mg q.12 hours.  7. Zocor 40 mg at bedtime.  8. Coumadin 2.5 mg daily.  9. Regular insulin sliding scale.  10. Lasix 40 mg q.12 hours.  11. Glucerna shake one can t.i.d. with meals.   SOCIAL HISTORY: The patient was living at home until admission for BKA earlier this month. He was admitted from [facility]. No tobacco or alcohol use.   FAMILY HISTORY: Positive for diabetes, heart disease, hypertension, and stroke.   PHYSICAL EXAMINATION:   VITAL SIGNS: Height 70 inches, weight 170 pounds, BMI 24.5, temperature 98.3, pulse 84, respirations 20, blood pressure 147/83, pulse oximetry 92% on 3 liters O2.   GENERAL: Well developed white male in no acute distress.   PSYCHIATRIC: Alert, oriented x3, calm, cooperative.   HEENT: Extraocular movements are intact. Oropharynx is clear. Mucous membranes moist.   NECK: Supple. No thyromegaly.   CARDIAC: Diastolic murmur. Regular rate and rhythm. No carotid bruit.   PULMONARY: Clear bilaterally. No wheeze.  ABDOMEN: Diffusely soft, nontender, nondistended.   SKIN: Several old ecchymotic lesions are present on the abdomen. No rash. Dry skin on right foot without ulcerative lesions.  EXTREMITIES: No leg edema is present. Left BKA is present.    LYMPH: No submandibular lymphadenopathy.   LABORATORY, DIAGNOSTIC, AND RADIOLOGICAL DATA: Glucose 329, BUN 53, creatinine 2.37, sodium 134, potassium 4.8, calcium 8.0, albumin 2.4, AST 24, ALT 22, hematocrit 29.5%, WBC 10.6, platelets 387. INR 2.4.    ASSESSMENT: This is an 81 year old male with a history of well controlled type I diabetes prior to admission with recently erratic blood sugar control in the setting of poor appetite.   RECOMMENDATIONS:  1. Restart Lantus, however, will give in morning due to prior history of nocturnal hypoglycemia when given at bedtime. I plan to give Lantus 5 units now and then initiate 20 units tomorrow morning.  2. Start NovoLog at meals, currently just 3 units, however, this can be titrated up as his oral intake increases.  3. Change sliding scale to NovoLog. Give 2 units per 50 over a target of 200. If he continues to have nighttime low sugars, I would hold his bedtime corrective dose.  4. Continue to monitor blood sugars q.a.c. and at bedtime.   5. Continue Glucerna, however, I did advise him to take this at meals and not in between meals. It is best to take within one hour of insulin injection. I also recommend no snacking between meals.  6. Megace can certainly raise blood sugars so once his appetite returns hopefully this medicine can be stopped.  7. Once the patient goes to nursing facility, emphasis should be made to give his NovoLog before he eats as patient and his daughter both indicate that at [place] in the past he had been getting his insulin after meals.   I will continue to follow with you.  ____________________________ A. Lavone Orn, MD ams:drc D: 08/13/2012 16:40:12 ET T: 08/13/2012 17:15:08 ET JOB#: [id number] cc: A. Lavone Orn, MD, <Dictator> Sherlon Handing MD ELECTRONICALLY SIGNED 08/15/2012 16:48

## 2015-04-07 NOTE — Consult Note (Signed)
PATIENT NAME:  Aaron [name], Aaron [name] MR#:  [id number] DATE OF BIRTH:  [birth year]  DATE OF CONSULTATION:  11/07/2012  REFERRING PHYSICIAN:  Dr. Elpidio Anis  CONSULTING PHYSICIAN:  Marcina Millard, MD  PRIMARY CARE PHYSICIAN: Dr. Hyacinth Meeker   CHIEF COMPLAINT: Shortness of breath.   REASON FOR CONSULTATION: Consultation requested for evaluation of congestive heart failure and elevated troponin.   HISTORY OF PRESENT ILLNESS: Patient is an 81 year old gentleman with known history of coronary artery disease, valvular heart disease, congestive heart failure who is referred for evaluation of elevated troponin. Patient has a complicated history including recent left below-the-knee amputation for gangrene followed by a stroke requiring rehabilitation at [facility] for a month. Patient currently is at home and experienced fluid retention with associated shortness of breath and was brought to [facility] on 11/06/2012. The patient was treated with diuretics with clinical improvement. Patient has borderline elevated troponin of 1.7 without peak or trough and in the absence of chest pain or ECG changes.   PAST MEDICAL HISTORY:  1. Status post coronary stent mid RCA 10/29/02. 2. Status post CABG with saphenous vein graft to posterolateral branch and Carpentier-Edwards tissue aortic valve on 04/20/07.   3. Hypertension.  4. Hyperlipidemia.  5. Chronic atrial fibrillation. 6. Mildly reduced left ventricular function with LVF of 40%.  7. Status post left below-knee amputation.  8. History of recent cerebrovascular accident. 9. Peripheral vascular disease. 10. Chronic kidney disease.   MEDICATIONS:  1. Aspirin 81 mg daily.  2. Coumadin 2.5 mg daily.  3. Amlodipine 10 mg daily.  4. Metoprolol tartrate 75 mg b.i.d.  5. Potassium 10 mEq daily.  6. Simvastatin 40 mg daily.  7. Torsemide 10 mg b.i.d.  8. Calcium with vitamin D 1 b.i.d.  9. Cymbalta 30 mg daily.   10. Lantus 30 units q.a.m.  11. MiraLax 17 mg daily p.r.n. 12. NovoLog 5 units sub-Q t.i.d. with meals.   SOCIAL HISTORY: Patient has a remote tobacco abuse history. Currently resides at home. Has 24-hour home health as well as daughters that visit frequently.   FAMILY HISTORY: No immediate family history of coronary artery disease or myocardial infarction.    REVIEW OF SYSTEMS: CONSTITUTIONAL: No fever or chills. EYES: No blurry vision. EARS: No hearing loss. RESPIRATORY: Shortness of breath as described above. CARDIOVASCULAR: No chest pain. GASTROINTESTINAL: No nausea, vomiting, diarrhea or constipation. GENITOURINARY: No dysuria, hematuria. ENDOCRINE: No polyuria or polydipsia. INTEGUMENTARY: No rash. MUSCULOSKELETAL: No arthralgias or myalgia. Patient is status post recent left below-the-knee amputation. NEUROLOGIC: No focal muscle weakness or numbness. PSYCHOLOGICAL: No depression or anxiety.   PHYSICAL EXAMINATION:  VITAL SIGNS: Blood pressure 127/74, pulse 62, respirations 18, temperature 98, pulse oximetry 97%.   HEENT: Pupils equal, reactive to light and accommodation.   NECK: Supple without thyromegaly.   LUNGS: Decreased breath sounds in both bases.   CARDIOVASCULAR: Normal S1 and S2. Grade 1 to 2/6 systolic murmur.   ABDOMEN: Soft and nontender.   EXTREMITIES: Patient is status post left below-knee amputation.   NEUROLOGIC: Patient was alert and oriented x3. Motor and sensory are both grossly intact.   IMPRESSION: 81 year old gentleman with complicated medical history as stated above who experienced recent fluid retention, congestive heart failure which appears to have resolved after diuresis. Patient has mildly elevated troponin without peak or trough in the absence of chest pain or ECG changes likely due to demand supply ischemia secondary to congestive heart failure and chronic renal insufficiency.   RECOMMENDATIONS:  1. Agree with current therapy.  2. Would defer full  dose anticoagulation at this time.  3. Would defer further cardiac diagnostics at this time.  4. Continue diuresis.   ____________________________ Marcina MillardAlexander Ryleigh Esqueda, MD ap:cms D: 11/07/2012 12:36:20 ET T: 11/07/2012 12:59:49 ET JOB#: [id number]  cc: Marcina MillardAlexander Alle Difabio, MD, <Dictator>  Marcina MillardALEXANDER Akacia Boltz MD ELECTRONICALLY SIGNED 11/30/2012 14:27

## 2015-04-07 NOTE — Consult Note (Signed)
Pt still not back from angiography. Will see patient  in AM. Will make patient NPO after MN in case endoscopy is scheduled tomorrow. Thanks  Electronic Signatures: Lutricia Feilh, Xhaiden Coombs (MD)  (Signed on 08-Aug-13 21:54)  Authored  Last Updated: 08-Aug-13 21:54 by Lutricia Feilh, Leia Coletti (MD)

## 2015-04-07 NOTE — Consult Note (Signed)
General Aspect patient is an 81 year old with history of coronary artery disease as well as aortic valvular disease status post coronary artery bypass grafting and aortic valve replacement in 2008 with a saphenous vein graft to the posterior lateral artery and a Carpentier-Edwards tissue aortic valve replacement.  He also has chronic atrial fibrillation, diabetes, hypertension, hyperlipidemia and severe peripheral vascular disease status post left BKA recently.  Patient was recently discharged to rehabilitation facility from [facility] after undergoing a BKA.  He apparently was in his usual state of health upon discharge however had progressive anorexia, shortness of breath, weight gain.  He was brought back to the emergency room where he was noted to be moderately hypoxic with evidence of volume overload on chest x-ray and a mild serum troponin elevation.  He also has chronic kidney disease grade 3 with a serum creatinine of 2.5 on admission.  He had mild serum troponin elevation as well.  He is a difficult historian the majority of the history comes from his daughter.  She states he has been having difficulty eating with progressive altered mental status, confusion and agitation.  He has been compliant with his medications as he is in an extended care facility.  He is on chronic anticoagulation for his atrial fibrillation which appears to be in good rate control at present.  Echocardiogram is pending.   Physical Exam:   GEN obese    HEENT moist oral mucosa    NECK No masses    RESP rhonchi  crackles    CARD Irregular rate and rhythm  Murmur    Murmur Systolic    Systolic Murmur Out flow    ABD denies tenderness  normal BS    LYMPH negative neck    EXTR negative cyanosis/clubbing, positive edema, status post left BKA    SKIN positive rashes    NEURO cranial nerves intact, motor/sensory function intact    PSYCH alert, poor insight   Review of Systems:   Subjective/Chief Complaint  shortness of breath    General: Fatigue  Weakness    Skin: Rashes    ENT: No Complaints    Eyes: No Complaints    Neck: No Complaints    Respiratory: Short of breath    Cardiovascular: Dyspnea  Edema    Gastrointestinal: No Complaints    Genitourinary: No Complaints    Vascular: status post left BKA    Musculoskeletal: No Complaints    Neurologic: No Complaints    Hematologic: No Complaints    Endocrine: No Complaints    Review of Systems: All other systems were reviewed and found to be negative    ROS the majority of his review of systems is provided by his daughter    Medications/Allergies Reviewed Medications/Allergies reviewed     lower leg edema:    leg ulcer:    Atrial Fibrillation:    PVD - Peripheral Vascular Disease:    Hard of Hearing/BILATERAL HEARING AIDS:    Back Pain, Chronic:    Aortic Stenosis:    Diabetes:    Cardiac stent: 2003   Heart murmur:    bypass surgery: 2008   aortic valve replacement:    Appendectomy: 50 years ago   T&A: As a child   Inguinal hernia repair: Twice prior to 1975  Home Medications: Medication Instructions Status  Septra DS 800 mg-160 mg oral tablet 1 tab(s) orally 2 times a day for 7 days (start on 08/03/12) Active  Norvasc 5 mg oral tablet 1 tab(s)  orally once a day (at bedtime) for HTN Active  Calcarb with D 600 mg-400 intl units oral tablet 1 tab(s) orally 2 times a day for nutritional supplement Active  Cymbalta 30 mg oral delayed release capsule 1 cap(s) orally once a day (at bedtime) for depression **do not crush** Active  Lantus 100 units/mL subcutaneous solution 20 unit(s) subcutaneous once a day (in the morning) for DM Active  Lopressor 50 mg oral tablet 1 tab(s) orally 2 times a day Active  Norco 5 mg-325 mg oral tablet 1-2 tab(s) orally every 6 hours, As Needed- for Pain (do not exceed 3 grams of APAP/day from all sources** Active  Novolin R 100 units/mL injectable solution  injectable 4  times a day (before meals and at bedtime) per sliding scale: if 0-200 = 0 units, 201-250 = 4 units, 251-300 = 6 units, 301-350 = 8 units, 351-400 = 10 units, 400+ = call MD Active  Tylenol Caplet Extra Strength 500 mg oral tablet 1-2 tab(s) ([phone] mg) orally every 6 hours, As Needed- for Pain/fever **do not exceed 3 grams of APAP/day from all sources** Active  Vitamin D3 1000 intl units oral tablet 1 tab(s) orally once a day (at bedtime) for nutritional supplement Active  Zocor 40 mg oral tablet 1 tab(s) orally once a day (at bedtime) Active   EKG:   Abnormal NSSTTW changes    Interpretation atrial fibrillation    Penicillin: Hives  Glucophage: Unknown    Impression 81 year old male with history of coronary artery disease as well as aortic valvular disease status post aortic valve replacement with a tissue prosthetic device as well as a coronary artery bypass graft to the posterior lateral artery done in 20,008 at [facility].  He also has chronic atrial fibrillation treated with rate control and chronic anticoagulation as well as diabetes, hypertension, hyperlipidemia, peripheral vascular disease status post recent below knee amputation on the left and now is readmitted with progressive confusion, hallucinations and shortness of breath.  He was noted to be moderately volume overloaded by x-ray in the emergency room.  His daughter who gives the majority of the history, states he has gained approximately 10 pounds since discharge.  She also states that he has been anorexic.  He has been compliant with his medications including Coumadin had a extended care facility where he was discharged after below-knee amputation.  He has a mild serum troponin elevation.  EKG does not suggest an acute ischemic event.  Patient denies chest pain but complains of weakness and fatigue.  He does have peripheral edema in the calf of his right leg.  Patient's creatinine has increased into the 2.5  range.  Etiology of his symptoms appears to be multifactorial.  Echocardiogram to evaluate his aortic valve and LV function is pending.  His elevated serum troponin may be related to his coronary disease but likely is multifactorial including renal insufficiency, congestive heart failure in the face of coronary artery disease.  He is hemodynamically stable at present.  He is also significantly hyperglycemic.    Plan 1.  Careful diuresis following renal function 2.  Renal dose all medications 3.  Would consider nephrology evaluation\.  Will review echocardiogram when available 5.  Continue to rule out for myocardial infarction 6.  Rule out infectious process as etiology of altered mental status 7.  Further recommendations depending on course 8.  Patient is not appear to be a candidate for invasive cardiac evaluation given his comorbid condition and renal status.  Electronic Signatures: Dalia HeadingFath, Nena Hampe A (MD)  (Signed 23-Aug-13 07:32)  Authored: General Aspect/Present Illness, History and Physical Exam, Review of System, Past Medical History, Home Medications, EKG , Allergies, Impression/Plan   Last Updated: 23-Aug-13 07:32 by Dalia HeadingFath, Jamisyn Langer A (MD)

## 2015-04-12 NOTE — Op Note (Signed)
PATIENT NAME:  Nancy FetterGILLIAM, Kari K MR#:  [id number] DATE OF BIRTH:  09-23-1924  DATE OF PROCEDURE:  01/19/2012  PREOPERATIVE DIAGNOSES:  1. Peripheral arterial disease with ulceration, left lower extremity.  2. Diabetes mellitus.  3. Atrial fibrillation.  4. Aortic stenosis.  5. Coronary artery disease.   POSTOPERATIVE DIAGNOSES:    1. Peripheral arterial disease with ulceration, left lower extremity.  2. Diabetes mellitus.  3. Atrial fibrillation.  4. Aortic stenosis.  5. Coronary artery disease.   PROCEDURES:  1. Ultrasound guidance for vascular access, right femoral artery.  2. Catheter placement to left peroneal artery from right femoral approach.  3. Aortogram and selective left lower extremity angiogram.  4. Percutaneous transluminal angioplasty of left popliteal artery with 5 mm diameter angioplasty balloon.  5. Percutaneous transluminal angioplasty of left tibioperoneal trunk and proximal peroneal artery with 3 mm diameter angioplasty balloon.  6. StarClose closure device, right femoral artery.   SURGEON: Annice NeedyJason S. Dew, M.D.   ANESTHESIA: Local with moderate conscious sedation.   ESTIMATED BLOOD LOSS: 25 mL.   INDICATIONS FOR THE PROCEDURE: This is an 81 year old white male with nonhealing ulcers to the left lower extremity. He has previously undergone revascularization for ulcers with wound healing. He has developed new ulcers and now has diminished perfusion. He is brought to the angiography suite for attempt at revascularization to attempt limb salvage. Risks and benefits were discussed. Informed consent was obtained.   DESCRIPTION OF PROCEDURE: The patient was brought to the vascular interventional radiology suite. Groins were shaved and prepped and a sterile surgical field was created. Due to his multiple previous accesses in the right groin, ultrasound is used to visualize the femoral arteries. This was accessed under ultrasound guidance without difficulty with a Seldinger  needle and permanent image was recorded. A J-wire and 5 French sheath were placed. Pigtail catheter was placed in the aorta at the L1 level and AP aortogram was performed. This showed what appeared to be moderate stenosis of the left renal artery. I did not visualize a right renal artery. The aorta and iliac segments were patent. I then hooked the aortic bifurcation and advanced the left femoral head and selective left lower extremity angiogram was then performed. This showed some mild stenosis of the superficial femoral and profunda femoris artery and several segments that were not flow limiting. The distal SFA stent was patent. Just below this stent the popliteal artery occluded and reconstituted the peroneal artery distally. The patient was given a small dose of intravenous heparin for systemic anticoagulation. A 6 French Ansel sheath was placed over a Terumo advantage wire. The Terumo advantage wire and a Kumpe catheter were used to cross the lesion and confirm intraluminal flow in the peroneal artery distally. I then replaced an 0.018 wire. A 3 mm diameter angioplasty balloon was used to inflate the tibioperoneal trunk and peroneal artery and a 5 mm diameter angioplasty balloon was used to treat the popliteal artery. Following angioplasty completion angiogram was then performed. This showed a dissection within the popliteal artery that did not appear flow-limiting. Tibioperoneal trunk and peroneal artery were patent and his flow to his foot was markedly improved. At this point I elected to terminate the procedure. The sheath was pulled back to the ipsilateral external iliac artery and oblique arteriogram was performed. A StarClose closure device was deployed in the usual fashion with excellent hemostatic result. The patient tolerated the procedure well and was taken to the recovery room in stable condition.   ____________________________  Annice Needy, MD jsd:ap D: 01/19/2012 14:09:26 ET T: 01/19/2012  14:31:28 ET JOB#: [id number]  cc: Annice Needy, MD, <Dictator> Danella Penton, MD Annice Needy MD ELECTRONICALLY SIGNED 02/06/2012 9:20
